# Patient Record
Sex: Female | Born: 1980
Health system: Southern US, Community
[De-identification: ages and names within clinical notes are randomized; demographics above are authoritative.]

## PROBLEM LIST (undated history)

## (undated) DIAGNOSIS — D649 Anemia, unspecified: Secondary | ICD-10-CM

## (undated) DIAGNOSIS — R42 Dizziness and giddiness: Secondary | ICD-10-CM

## (undated) DIAGNOSIS — E13319 Other specified diabetes mellitus with unspecified diabetic retinopathy without macular edema: Secondary | ICD-10-CM

## (undated) DIAGNOSIS — I509 Heart failure, unspecified: Secondary | ICD-10-CM

## (undated) DIAGNOSIS — M1990 Unspecified osteoarthritis, unspecified site: Secondary | ICD-10-CM

## (undated) DIAGNOSIS — N189 Chronic kidney disease, unspecified: Secondary | ICD-10-CM

## (undated) DIAGNOSIS — E119 Type 2 diabetes mellitus without complications: Secondary | ICD-10-CM

## (undated) DIAGNOSIS — G629 Polyneuropathy, unspecified: Secondary | ICD-10-CM

## (undated) DIAGNOSIS — M419 Scoliosis, unspecified: Secondary | ICD-10-CM

## (undated) DIAGNOSIS — I824Z9 Acute embolism and thrombosis of unspecified deep veins of unspecified distal lower extremity: Secondary | ICD-10-CM

## (undated) DIAGNOSIS — M797 Fibromyalgia: Secondary | ICD-10-CM

## (undated) DIAGNOSIS — K219 Gastro-esophageal reflux disease without esophagitis: Secondary | ICD-10-CM

## (undated) DIAGNOSIS — N289 Disorder of kidney and ureter, unspecified: Secondary | ICD-10-CM

## (undated) DIAGNOSIS — G4730 Sleep apnea, unspecified: Secondary | ICD-10-CM

## (undated) DIAGNOSIS — E785 Hyperlipidemia, unspecified: Secondary | ICD-10-CM

## (undated) DIAGNOSIS — J45909 Unspecified asthma, uncomplicated: Secondary | ICD-10-CM

## (undated) DIAGNOSIS — I10 Essential (primary) hypertension: Secondary | ICD-10-CM

## (undated) HISTORY — DX: Anemia, unspecified: D64.9

## (undated) HISTORY — PX: OTHER SURGICAL HISTORY: SHX169

## (undated) HISTORY — DX: Sleep apnea, unspecified: G47.30

## (undated) HISTORY — PX: CHOLECYSTECTOMY: SHX55

## (undated) HISTORY — PX: EYE SURGERY: SHX253

## (undated) HISTORY — PX: ABLATION ENDOMETRIAL: SHX51001

## (undated) HISTORY — DX: Scoliosis, unspecified: M41.9

## (undated) HISTORY — DX: Acute embolism and thrombosis of unspecified deep veins of unspecified distal lower extremity: I82.4Z9

## (undated) HISTORY — PX: APPENDECTOMY (OPEN): SHX54

## (undated) HISTORY — DX: Disorder of kidney and ureter, unspecified: N28.9

## (undated) HISTORY — DX: Type 2 diabetes mellitus without complications: E11.9

## (undated) HISTORY — DX: Gastro-esophageal reflux disease without esophagitis: K21.9

## (undated) NOTE — H&P (Signed)
Formatting of this note is different from the original.  Images from the original note were not included.  PATIENT NAME:  [name]  PATIENT DOB:  1981/02/22    DATE OF SERVICE: 01/06/2023    REASON FOR VISIT:   Chief Complaint   Patient presents with    Consult     Bariatric program     HPI:  I had the pleasure of seeing [name] in the office today.  In brief, this 89 y.o. female with stage 4 chronic kidney disease, diabetes, hypertension, and DVT/PE on anticoagulation was referred for possible bariatric surgery.    [name] tells me that she has had weight problems ever since her 1st pregnancy at age 76.  That pregnancy was complicated by what sounds like a necrotizing soft tissue infection requiring take back to the operating room, small-bowel resection, and abdominal wall reconstruction with mesh.  A similar infection actually happened after her 2nd pregnancy as well.  She has seen Infectious Disease and no one is sure why this happened multiple times.  She has gone through other surgeries without such severe complications such as hand surgeries.  From a weight standpoint she has ?tried everything? including multiple dietary and fitness regimens.  She has been able to lose about 45 lb, but the weight came back.  She is currently on Wegovy but has not seen a major change.  For all of these reasons she is interested in weight loss surgery.    [name] tells me she does have some GERD symptoms 3-4 times per week.  She denies dysphagia or odynophagia and is on omeprazole as needed.  She takes an aspirin a day and is on Eliquis for her DVT/PE history.      The pertinent past medical, surgical, social, family, medication, and allergy history was reviewed and updated in the EMR as necessary.  See below for details.    PAST HISTORIES:  Past Medical History:   Diagnosis Date    Asthma     Chronic kidney disease (CKD), active medical management without dialysis, stage 4 (severe)     Diabetes mellitus, type  2     Fibromyalgia     GERD (gastroesophageal reflux disease)     Hyperlipidemia     Hypertension     Sleep apnea     no cpap     Past Surgical History:   Procedure Laterality Date    APPENDECTOMY  2002    CESAREAN SECTION  2000    x2 second one in 2006    CHOLECYSTECTOMY  2000    HYSTERECTOMY  2021    LAPAROTOMY  2000    abdominal wall debeidement, repair with mesh, partial colon resection     Past family and social history is notable for:  Smoking/vaping: No  The patient is a nonsmoker who denies alcohol or illegal drugs.  There is no family history of bleeding/clotting disorders, nor issues with anesthesia or cancer in primary relatives.    Current Outpatient Medications:     ALBUTEROL IN, Inhale into the lungs., Disp: , Rfl:     apixaban (ELIQUIS) 5 MG TABS, Take 1 tablet by mouth 2 times daily., Disp: , Rfl:     aspirin 81 MG chewable tablet, Take 1 tablet by mouth daily., Disp: , Rfl:     atorvaSTATin (LIPITOR) 20 MG tablet, Take 1 tablet by mouth daily., Disp: , Rfl:     bumetanide (BUMEX) 2 MG tablet, Take 1 tablet by  mouth daily., Disp: , Rfl:     buPROPion 12 HR (WELLBUTRIN SR) 150 MG 12 hr tablet, Take 1 tablet by mouth 2 times daily., Disp: , Rfl:     carvedilol (COREG) 25 MG tablet, Take 1 tablet by mouth 2 times daily (with meals)., Disp: , Rfl:     DULoxetine (CYMBALTA) 30 MG capsule, Take 1 capsule by mouth daily., Disp: , Rfl:     hydrALAZINE (APRESOLINE) 50 MG tablet, Take 1 tablet by mouth 3 times daily., Disp: , Rfl:     hydrOXYzine (ATARAX) 25 MG tablet, Take 1 tablet by mouth as needed., Disp: , Rfl:     Insulin Detemir (LEVEMIR FLEXPEN SC), Inject into the skin., Disp: , Rfl:     insulin lispro (HUMALOG) 100 UNIT/ML injection, Inject into the skin 3 times daily (with meals)., Disp: , Rfl:     Mometasone Furo-Formoterol Fum 200-5 MCG/ACT AERO, Inhale 200 Inhalers into the lungs., Disp: , Rfl:     omeprazole (PRILOSEC) 10 MG capsule, Take 1 capsule by mouth as needed., Disp: , Rfl:      pregabalin (LYRICA) 100 MG capsule, Take by mouth 2 times daily., Disp: , Rfl:     semaglutide-Weight Management (WEGOVY) 1 MG/0.5ML SOAJ injection, Inject 0.5 mL into the skin once a week., Disp: , Rfl:     traZODone HCl POWD, by Does not apply route., Disp: , Rfl:   I personally reviewed the patient's medication list.  Allergies   Allergen Reactions    Amlodipine Other (See Comments)     allergy    Bactrim [Sulfamethoxazole W-Trimethoprim] Other (See Comments)     allergy    Glipizide Other (See Comments)     allergy    Lisinopril Other (See Comments)     allergy     ROS:  Review of Systems   Constitutional: Negative.    HENT: Negative.     Respiratory: Negative.     Cardiovascular: Negative.    Gastrointestinal:         See HPI for GI issues   Genitourinary: Negative.    Musculoskeletal: Negative.    Skin: Negative.    Neurological: Negative.    Psychiatric/Behavioral: Negative.         PHYSICAL EXAMINATION:  Vitals:    01/06/23 0926   BP: 140/72   BP Location: Right arm   Patient Position: Sitting   BP Cuff Size: Adult   Pulse: 84   Temp: 36.4 C (97.5 F)   Weight: (!) 135.6 kg (299 lb)   Height: 1.676 m ('5\' 6"'$ )     Body mass index is 48.26 kg/m.    Physical Exam  Vitals and nursing note reviewed. Exam conducted with a chaperone present.   Constitutional:       General: She is not in acute distress.     Appearance: Normal appearance. She is obese. She is not ill-appearing, toxic-appearing or diaphoretic.   HENT:      Head: Normocephalic and atraumatic.   Eyes:      General: No scleral icterus.     Extraocular Movements: Extraocular movements intact.   Cardiovascular:      Rate and Rhythm: Normal rate and regular rhythm.   Pulmonary:      Effort: Pulmonary effort is normal. No respiratory distress.      Breath sounds: No stridor. No wheezing.   Abdominal:      General: Abdomen is flat. There is no distension.      Palpations:  Abdomen is soft. There is no mass.      Tenderness: There is no abdominal tenderness.  There is no guarding.      Hernia: No hernia is present.      Comments: multiple well healed lower abdominal scars and some abdominal deformity, laparoscopic upper abdominal scars   Musculoskeletal:         General: No swelling or deformity. Normal range of motion.      Cervical back: Normal range of motion.   Skin:     General: Skin is warm and dry.      Coloration: Skin is not jaundiced.   Neurological:      General: No focal deficit present.      Mental Status: She is alert and oriented to person, place, and time. Mental status is at baseline.   Psychiatric:         Mood and Affect: Mood normal.         Behavior: Behavior normal.         Thought Content: Thought content normal.         Judgment: Judgment normal.     Labs/Imaging/Pathology:  I personally reviewed any relevant labs, imaging, and pathology with the patient.  Of note, none.    ASSESSMENT:  Primary Visit Diagnosis: Class 3 severe obesity due to excess calories with serious comorbidity and body mass index (BMI) of 45.0 to 49.9 in adult [E66.01, Z68.42]    [name] has class three obesity and associated comorbidities such as obstructive sleep apnea and hypertension resistant to medical therapy.  She is a higher risk surgical candidate given her multiple comorbidities otherwise.    PLAN:  I had a lengthy discussion with [name] today regarding bariatric surgery.  We spoke about obesity and treatment options, focusing on surgical interventions.  I highlighted the lifestyle changes/dietary management that are also required concurrently for success.  We talked about surgical procedures, namely sleeve gastrectomy and gastric bypass, and briefly went over the differences.  We also discussed insurance and program requirements for a successful bariatric journey.  In her case, I would lean towards a sleeve even if significant GERD is present given her potential need for kidney transplant, and polypharmacy/potential future steroid use.    In addition to our standard  program and patient-specific insurance requirements, I recommend adding an upper endoscopy, cardiology evaluation (given her prior cardiac arrest with her pregnancies), nephrology optimization (given CKD), and hematology evaluation (given her prior DVT/PE history and anti-platelet + anticoagulation therapy preoperatively.  I therefore obtained informed consent for an EGD.  I explained the relative risks/benefits, alternative options, possible complications, and possibility of unexpected intraoperative pathology which could lead to adjustments or major changes to the procedure.  All questions were answered and we will plan this per reflux endoscopy protocol at [facility] - we will stop the Eliquis for 48 hours and continue her aspirin; this will be triple-checked with her PCP.    I will plan to follow up with [name] in a few months after completion of our bariatric program, at which time we will hone in on definitive management/operation of choice.  I was able to answer all questions today and feel that this patient could be an good bariatric candidate with a careful preoperative evaulation.    Lynn Passy Isabell Jarvis, MD, DABS, FACS FASMBS  Minimally Invasive Surgery, Bariatrics, & Flexible Endoscopy  [facility]    Electronically signed by Mike Craze, MD at 01/06/2023 11:11 AM EST

## (undated) NOTE — Progress Notes (Signed)
Formatting of this note might be different from the original.  Assessment:  Pt attended [facility] Class A for pre bariatric surgery nutrition education, in preparation for bariatric surgery.  Pt has struggled with her weight and has been unsuccessful with long term weight loss.    Diagnosis: Food- and nutrition-related knowledge deficit related to lack of prior nutrition education as evidenced by questions related to nutrition to support improved eating habits, post-bariatric surgery success, and long-term weight maintenance.    Intervention:  During this live, virtual & interactive class, education was provided on the postoperative diet guidelines and behavioral changes recommended to prepare for success after surgery.    Topics included basic nutrition principles, including slowing rate of eating. Rationale was given, as were tips to achieve. Emphasized need for scheduled meals and snacks through the day. Discussed portions and signs of fullness. Emphasized diet goal of 60-80 g/d protein and how to achieve. Fluid goals discussed, with 48-64+ oz/day recommended.     Emphasized activity guidelines and how to incorporate more activity into lifestyle, gradually post op, short term and long term.     Discussed SMART goals, rationale and how to set goals for self. Discussed how to read food labels, particularly for protein, calories, added sugars, other nutrients & how to make choices. Pt able to successfully answer 6 of 6 quiz questions with 100% accuracy.     Discussed healthy habits, including shopping tips. Showed Plate Method for Powering Up nutrition from meals.    Goals:  Aim for 3 meals and 1-2 planned snacks per day  Practice separating ingestion of foods and fluids; sip fluids slowly & steadily throughout the day  Begin eliminating sugar-sweetened beverages, caffeine, carbonation  Aim to include 30 minutes of moderate activity on 5 days per week, as tolerated    Educational Materials Provided: Bariatric  Postoperative Nutrition Guidelines  Monitoring and Evaluation: food/nutrition intake, food- and nutrition-related knowledge, mealtime behaviors, anthropometric measurements, biochemical data, comparative standards    Plan: Continue nutrition visits in preparation for surgery.  Face to face time with patient: 60 minutes   Electronically signed by Dianna Limbo, RD at 02/13/2023  2:46 PM EDT

## (undated) NOTE — Telephone Encounter (Signed)
Formatting of this note might be different from the original.  Phone call to pt, returning her call.  Pt notes that she has been in hospital and ED visit(s) that caused her to delay completing some bariatric prerequisites. She has not had a nutrition appt in 3 months. Part of that was because a lapse in her insurance coverage.   Reviewed prerequisites, including need for 6 monthly nutr appts in 6 consecutive months, per her insurance. Will schedule pt again for nutrition appt. Sees RD for first individual appt next month and has completed two group nutrition classes.   Reviewed clearances she needs from PCP and from her specialists (nephrology, cardiology, hematology).   Electronically signed by Dianna Limbo, RD at 06/03/2023  3:28 PM EDT

---
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
## 2012-02-14 DIAGNOSIS — G629 Polyneuropathy, unspecified: Secondary | ICD-10-CM | POA: Insufficient documentation

## 2019-08-11 DIAGNOSIS — I2699 Other pulmonary embolism without acute cor pulmonale: Secondary | ICD-10-CM

## 2019-08-11 HISTORY — DX: Other pulmonary embolism without acute cor pulmonale: I26.99

## 2019-12-12 HISTORY — PX: HYSTERECTOMY: SHX81

## 2020-08-09 ENCOUNTER — Emergency Department
Admission: EM | Admit: 2020-08-09 | Discharge: 2020-08-10 | Discharge status: Against Medical Advice | Payer: No Typology Code available for payment source | Attending: Emergency Medicine | Admitting: Emergency Medicine

## 2020-08-09 DIAGNOSIS — Z539 Procedure and treatment not carried out, unspecified reason: Secondary | ICD-10-CM | POA: Insufficient documentation

## 2020-08-09 MED ORDER — ONDANSETRON HCL 4 MG/2ML IJ SOLN
4.0000 mg | Freq: Once | INTRAMUSCULAR | Status: DC
Start: 2020-08-09 — End: 2020-08-10

## 2020-08-09 NOTE — EDIE (Signed)
COLLECTIVE?NOTIFICATION?08/09/2020 21:28?Currie Paris?MRN: [id number]    Criteria Met      5 ED Visits in 12 Months    3 Different Facilities in 90 Days    Security and Safety  No recent Security Events currently on file    ED Care Guidelines  There are currently no ED Care Guidelines for this patient. Please check your facility's medical records system.        Prescription Monitoring Program  120??- Narcotic Use Score  060??- Sedative Use Score  000??- Stimulant Use Score  310??- Overdose Risk Score  - All Scores range from 000-999 with 75% of the population scoring < 200 and on 1% scoring above 650  - The last digit of the narcotic, sedative, and stimulant score indicates the number of active prescriptions of that type  - Higher Use scores correlate with increased prescribers, pharmacies, mg equiv, and overlapping prescriptions  - Higher Overdose Risk Scores correlate with increased risk of unintentional overdose death   Concerning or unexpectedly high scores should prompt a review of the PMP record; this does not constitute checking PMP for prescribing purposes.      E.D. Visit Count (12 mo.)  Facility Visits   [facility] 4   [facility] 2   [place] [facility] 1   [facility] 1   Total 8   Note: Visits indicate total known visits.     Recent Emergency Department Visit Summary  Date Facility [facility] Type Diagnoses or Chief Complaint   Aug 09, 2020 [place] [facility] [place] [facility]. [place] [facility]      nausea      Jun 22, 2020 [facility] Cur[facility]. Staff. [place] [facility]      Mental Health      Mental Health Problem      Hyperglycemia, unspecified      Major depressive disorder, single episode, unspecified      Jun 14, 2020 [facility] Rosemarie Ax. [place] [facility]      Dorsalgia, unspecified      Type 2 diabetes mellitus with hyperglycemia      Long term (current) use of insulin      May 29, 2020 [facility]  Rosemarie Ax. [place] [facility]      Nondisplaced fracture of fifth metatarsal bone, left foot, initial encounter for closed fracture      Fall on same level from slipping, tripping and stumbling without subsequent striking against object, initial encounter      Oct 25, 2019 [place] [facility] Rosemarie Ax. [place] [facility]      EMS      Syncope      Syncope and collapse      Dizziness and giddiness      Sep 30, 2019 [place] [facility]. Rosemarie Ax. [place] [facility]      SOB/Difficulty Breathing      Abnormal Lab      Heart failure, unspecified      Localized edema      Sep 25, 2019 [place] [facility] Rosemarie Ax. [place] [facility]      Chest Pain      Leg Swelling      Edema, unspecified      Aug 19, 2019 [place] [facility]. Rosemarie Ax. [place] [facility]      Chest Pain      Asthma      Other pulmonary embolism without acute cor pulmonale  Recent Inpatient Visit Summary  Date Facility [facility] Type Diagnoses or Chief Complaint   Dec 26, 2019 [facility] [place] [facility]. 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marie Ax. [facility]      Heart failure, unspecified      Localized edema      Hypertensive urgency      Aug 19, 2019 [place] [facility]. Rosemarie Ax. [place] [facility]      Other pulmonary embolism without acute cor pulmonale      Acute diastolic (congestive) heart failure      Acute  posthemorrhagic anemia      Abnormal uterine and vaginal bleeding, unspecified          Care Team  Provider Specialty Phone Fax Service Dates   [facility] Clinic/Center: [facility]) [phone] Current    Nash Mantis , MD Family Medicine: Adult Medicine   Current      Collective Portal  This patient has registered at the [facility] Emergency Department   For more information visit: [email or web address]     PLEASE NOTE:     1.   Any care recommendations and other clinical information are provided as guidelines or for historical purposes only, and providers should exercise their own clinical judgment when providing care.    2.   You may only use this information for purposes of treatment, payment or health care operations activities, and subject to the limitations of applicable Collective Policies.    3.   You should consult directly with the organization that provided a care guideline or other clinical history with any questions about additional information or accuracy or completeness of information provided.    ? 2021 [place], [employer] - [email or web address]

## 2020-08-09 NOTE — ED Triage Notes (Signed)
[name] is a 39 y.o. female with a cc of n/v and abdominal cramping x 2 days. Pt reports 3 episodes of emesis today. No meds PTA. Pt also reports concern for COVID as she reports her son was exposed to COVID-19 at school.  Blood pressure 157/89, pulse (!) 102, temperature 98.8 F (37.1 C), temperature source Oral, resp. rate 18, SpO2 98 %.

## 2020-08-10 ENCOUNTER — Emergency Department: Payer: No Typology Code available for payment source

## 2020-08-10 ENCOUNTER — Inpatient Hospital Stay
Admission: EM | Admit: 2020-08-10 | Discharge: 2020-08-13 | DRG: 420 | Disposition: A | Discharge status: Home / Self Care | Payer: No Typology Code available for payment source | Attending: Internal Medicine | Admitting: Internal Medicine

## 2020-08-10 DIAGNOSIS — J45901 Unspecified asthma with (acute) exacerbation: Secondary | ICD-10-CM

## 2020-08-10 DIAGNOSIS — R0602 Shortness of breath: Secondary | ICD-10-CM

## 2020-08-10 DIAGNOSIS — Z7901 Long term (current) use of anticoagulants: Secondary | ICD-10-CM

## 2020-08-10 DIAGNOSIS — N183 Chronic kidney disease, stage 3 unspecified: Secondary | ICD-10-CM | POA: Diagnosis present

## 2020-08-10 DIAGNOSIS — E1322 Other specified diabetes mellitus with diabetic chronic kidney disease: Secondary | ICD-10-CM | POA: Diagnosis present

## 2020-08-10 DIAGNOSIS — N179 Acute kidney failure, unspecified: Secondary | ICD-10-CM | POA: Diagnosis present

## 2020-08-10 DIAGNOSIS — T50905A Adverse effect of unspecified drugs, medicaments and biological substances, initial encounter: Secondary | ICD-10-CM | POA: Diagnosis present

## 2020-08-10 DIAGNOSIS — I130 Hypertensive heart and chronic kidney disease with heart failure and stage 1 through stage 4 chronic kidney disease, or unspecified chronic kidney disease: Secondary | ICD-10-CM | POA: Diagnosis present

## 2020-08-10 DIAGNOSIS — Z79899 Other long term (current) drug therapy: Secondary | ICD-10-CM

## 2020-08-10 DIAGNOSIS — I5032 Chronic diastolic (congestive) heart failure: Secondary | ICD-10-CM | POA: Diagnosis present

## 2020-08-10 DIAGNOSIS — E1340 Other specified diabetes mellitus with diabetic neuropathy, unspecified: Secondary | ICD-10-CM | POA: Diagnosis present

## 2020-08-10 DIAGNOSIS — J4521 Mild intermittent asthma with (acute) exacerbation: Secondary | ICD-10-CM | POA: Diagnosis present

## 2020-08-10 DIAGNOSIS — E1365 Other specified diabetes mellitus with hyperglycemia: Secondary | ICD-10-CM | POA: Diagnosis present

## 2020-08-10 DIAGNOSIS — I2699 Other pulmonary embolism without acute cor pulmonale: Secondary | ICD-10-CM | POA: Diagnosis present

## 2020-08-10 DIAGNOSIS — R739 Hyperglycemia, unspecified: Secondary | ICD-10-CM | POA: Diagnosis present

## 2020-08-10 DIAGNOSIS — Z20822 Contact with and (suspected) exposure to covid-19: Secondary | ICD-10-CM | POA: Diagnosis present

## 2020-08-10 DIAGNOSIS — E785 Hyperlipidemia, unspecified: Secondary | ICD-10-CM | POA: Diagnosis present

## 2020-08-10 DIAGNOSIS — Z833 Family history of diabetes mellitus: Secondary | ICD-10-CM

## 2020-08-10 DIAGNOSIS — E1300 Other specified diabetes mellitus with hyperosmolarity without nonketotic hyperglycemic-hyperosmolar coma (NKHHC): Principal | ICD-10-CM | POA: Diagnosis present

## 2020-08-10 DIAGNOSIS — M797 Fibromyalgia: Secondary | ICD-10-CM | POA: Diagnosis present

## 2020-08-10 DIAGNOSIS — Z6841 Body Mass Index (BMI) 40.0 and over, adult: Secondary | ICD-10-CM

## 2020-08-10 HISTORY — DX: Fibromyalgia: M79.7

## 2020-08-10 HISTORY — DX: Unspecified osteoarthritis, unspecified site: M19.90

## 2020-08-10 HISTORY — DX: Heart failure, unspecified: I50.9

## 2020-08-10 HISTORY — DX: Polyneuropathy, unspecified: G62.9

## 2020-08-10 HISTORY — DX: Dizziness and giddiness: R42

## 2020-08-10 HISTORY — DX: Hyperlipidemia, unspecified: E78.5

## 2020-08-10 HISTORY — DX: Unspecified asthma, uncomplicated: J45.909

## 2020-08-10 HISTORY — DX: Essential (primary) hypertension: I10

## 2020-08-10 HISTORY — DX: Chronic kidney disease, unspecified: N18.9

## 2020-08-10 HISTORY — DX: Type 2 diabetes mellitus without complications: E11.9

## 2020-08-10 LAB — GLUCOSE WHOLE BLOOD - POCT
Whole Blood Glucose POCT: 347 mg/dL — ABNORMAL HIGH (ref 70–100)
Whole Blood Glucose POCT: 347 mg/dL — ABNORMAL HIGH (ref 70–100)

## 2020-08-10 LAB — CBC AND DIFFERENTIAL
Absolute NRBC: 0 10*3/uL (ref 0.00–0.00)
Basophils Absolute Automated: 0.06 10*3/uL (ref 0.00–0.08)
Basophils Automated: 0.5 %
Eosinophils Absolute Automated: 0.29 10*3/uL (ref 0.00–0.44)
Eosinophils Automated: 2.2 %
Hematocrit: 32.9 % — ABNORMAL LOW (ref 34.7–43.7)
Hgb: 10.4 g/dL — ABNORMAL LOW (ref 11.4–14.8)
Immature Granulocytes Absolute: 0.06 10*3/uL (ref 0.00–0.07)
Immature Granulocytes: 0.5 %
Lymphocytes Absolute Automated: 3.84 10*3/uL — ABNORMAL HIGH (ref 0.42–3.22)
Lymphocytes Automated: 29.3 %
MCH: 25.9 pg (ref 25.1–33.5)
MCHC: 31.6 g/dL (ref 31.5–35.8)
MCV: 81.8 fL (ref 78.0–96.0)
MPV: 10.8 fL (ref 8.9–12.5)
Monocytes Absolute Automated: 0.74 10*3/uL (ref 0.21–0.85)
Monocytes: 5.7 %
Neutrophils Absolute: 8.1 10*3/uL — ABNORMAL HIGH (ref 1.10–6.33)
Neutrophils: 61.8 %
Nucleated RBC: 0 /100 WBC (ref 0.0–0.0)
Platelets: 404 10*3/uL — ABNORMAL HIGH (ref 142–346)
RBC: 4.02 10*6/uL (ref 3.90–5.10)
RDW: 12 % (ref 11–15)
WBC: 13.09 10*3/uL — ABNORMAL HIGH (ref 3.10–9.50)

## 2020-08-10 LAB — COMPREHENSIVE METABOLIC PANEL
ALT: 17 U/L (ref 0–55)
AST (SGOT): 11 U/L (ref 5–34)
Albumin/Globulin Ratio: 0.9 (ref 0.9–2.2)
Albumin: 3.6 g/dL (ref 3.5–5.0)
Alkaline Phosphatase: 104 U/L (ref 37–106)
Anion Gap: 13 (ref 5.0–15.0)
BUN: 82 mg/dL — ABNORMAL HIGH (ref 7.0–19.0)
Bilirubin, Total: 0.3 mg/dL (ref 0.2–1.2)
CO2: 24 mEq/L (ref 22–29)
Calcium: 9.6 mg/dL (ref 8.5–10.5)
Chloride: 91 mEq/L — ABNORMAL LOW (ref 100–111)
Creatinine: 2.2 mg/dL — ABNORMAL HIGH (ref 0.6–1.0)
Globulin: 4.2 g/dL — ABNORMAL HIGH (ref 2.0–3.6)
Glucose: 398 mg/dL — ABNORMAL HIGH (ref 70–100)
Potassium: 4.3 mEq/L (ref 3.5–5.1)
Protein, Total: 7.8 g/dL (ref 6.0–8.3)
Sodium: 128 mEq/L — ABNORMAL LOW (ref 136–145)

## 2020-08-10 LAB — BLOOD GAS, VENOUS
Base Excess, Ven: 0.9 mEq/L
HCO3, Ven: 26.3 mEq/L
O2 Sat, Venous: 65.6 %
Temperature: 37
Venous Total CO2: 55.3 mEq/L
pCO2, Venous: 48.5 mmHg
pH, Ven: 7.354
pO2, Venous: <42.7 mmHg

## 2020-08-10 LAB — COVID-19 (SARS-COV-2) & INFLUENZA  A/B, NAA (ROCHE LIAT)
Influenza A: NOT DETECTED
Influenza B: NOT DETECTED
SARS CoV 2 Overall Result: NOT DETECTED

## 2020-08-10 LAB — HEMOLYSIS INDEX: Hemolysis Index: 0 (ref 0–18)

## 2020-08-10 LAB — GFR: EGFR: 24.6

## 2020-08-10 LAB — LACTIC ACID, PLASMA: Lactic Acid: 1.2 mmol/L (ref 0.2–2.0)

## 2020-08-10 LAB — TROPONIN I: Troponin I: <0.01 ng/mL (ref 0.00–0.05)

## 2020-08-10 MED ORDER — SODIUM CHLORIDE 0.9 % IV BOLUS
500.0000 mL | Freq: Once | INTRAVENOUS | Status: AC
Start: 2020-08-10 — End: 2020-08-11
  Administered 2020-08-10: 23:00:00 500 mL via INTRAVENOUS

## 2020-08-10 MED ORDER — SODIUM CHLORIDE 0.9 % IV BOLUS
1000.0000 mL | Freq: Once | INTRAVENOUS | Status: DC
Start: 2020-08-10 — End: 2020-08-10

## 2020-08-10 MED ORDER — DEXAMETHASONE SOD PHOSPHATE PF 10 MG/ML IJ SOLN
10.0000 mg | Freq: Once | INTRAMUSCULAR | Status: AC
Start: 2020-08-10 — End: 2020-08-10
  Administered 2020-08-10: 10 mg via INTRAVENOUS
  Filled 2020-08-10: qty 1

## 2020-08-10 MED ORDER — ALBUTEROL SULFATE HFA 108 (90 BASE) MCG/ACT IN AERS
4.0000 | INHALATION_SPRAY | Freq: Once | RESPIRATORY_TRACT | Status: AC
Start: 2020-08-10 — End: 2020-08-10
  Administered 2020-08-10: 23:00:00 4 via RESPIRATORY_TRACT

## 2020-08-10 MED ORDER — ONDANSETRON HCL 4 MG/2ML IJ SOLN
4.0000 mg | Freq: Once | INTRAMUSCULAR | Status: AC
Start: 2020-08-10 — End: 2020-08-10
  Administered 2020-08-10: 4 mg via INTRAVENOUS
  Filled 2020-08-10: qty 2

## 2020-08-10 NOTE — EDIE (Signed)
COLLECTIVE?NOTIFICATION?08/10/2020 21:11?Currie Paris?MRN: [id number]    Criteria Met      3 Different Facilities in 90 Days    5 ED Visits in 12 Months    Security and Safety  No recent Security Events currently on file    ED Care Guidelines  There are currently no ED Care Guidelines for this patient. Please check your facility's medical records system.        Prescription Monitoring Program  120??- Narcotic Use Score  060??- Sedative Use Score  000??- Stimulant Use Score  310??- Overdose Risk Score  - All Scores range from 000-999 with 75% of the population scoring < 200 and on 1% scoring above 650  - The last digit of the narcotic, sedative, and stimulant score indicates the number of active prescriptions of that type  - Higher Use scores correlate with increased prescribers, pharmacies, mg equiv, and overlapping prescriptions  - Higher Overdose Risk Scores correlate with increased risk of unintentional overdose death   Concerning or unexpectedly high scores should prompt a review of the PMP record; this does not constitute checking PMP for prescribing purposes.      E.D. Visit Count (12 mo.)  Facility Visits   [facility] 4   [facility] 2   [place] [facility] 2   [facility] 1   Total 9   Note: Visits indicate total known visits.     Recent Emergency Department Visit Summary  Date Facility [facility] Type Diagnoses or Chief Complaint   Aug 10, 2020 [place] [facility] [place] [facility] Alexa. [place] [facility]      sob; difficulty breathing; diabetic; chest pain      Aug 09, 2020 [place] [facility] [place] [facility] Alexa. [place] [facility]      nausea      Abdominal Cramping      Emesis      Jun 22, 2020 [facility] Cur[facility]. Staff. [place] [facility]      Mental Health      Mental Health Problem      Hyperglycemia, unspecified      Major depressive disorder, single episode, unspecified      Jun 14, 2020 [facility] Rosemarie Ax. [place] [facility]      Dorsalgia,  unspecified      Type 2 diabetes mellitus with hyperglycemia      Long term (current) use of insulin      May 29, 2020 [facility]marie Ax. [place] [facility]      Nondisplaced fracture of fifth metatarsal bone, left foot, initial encounter for closed fracture      Fall on same level from slipping, tripping and stumbling without subsequent striking against object, initial encounter      Oct 25, 2019 [place] [facility] Rosemarie Ax. [place] [facility]      EMS      Syncope      Syncope and collapse      Dizziness and giddiness      Sep 30, 2019 [place] [facility]. Rosemarie Ax. [place] [facility]      SOB/Difficulty Breathing      Abnormal Lab      Heart failure, unspecified      Localized edema      Sep 25, 2019 [place] [facility]. Rosemarie Ax.  Emergency      Chest Pain      Leg Swelling      Edema, unspecified      Aug 19, 2019 [facility]  [place] [facility] Rosemarie Ax. [place] [facility]      Chest Pain      Asthma      Other pulmonary embolism without acute cor pulmonale          Recent Inpatient Visit Summary  Date Facility [facility] Type Diagnoses or Chief Complaint   Dec 26, 2019 [facility] [place] [facility] 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 Rosemarie Ax. [place] [facility]      Heart failure, unspecified      Localized edema      Hypertensive urgency      Aug 19, 2019 [place] [facility].  Rosemarie Ax. [place] [facility]      Other pulmonary embolism without acute cor pulmonale      Acute diastolic (congestive) heart failure      Acute posthemorrhagic anemia      Abnormal uterine and vaginal bleeding, unspecified          Care Team  Provider Specialty Phone Fax Service Dates   [facility] Clinic/Center: [facility]) [phone] Current    Nash Mantis , MD Family Medicine: Adult Medicine   Current      Collective Portal  This patient has registered at the [facility] Emergency Department   For more information visit: [email or web address]     PLEASE NOTE:     1.   Any care recommendations and other clinical information are provided as guidelines or for historical purposes only, and providers should exercise their own clinical judgment when providing care.    2.   You may only use this information for purposes of treatment, payment or health care operations activities, and subject to the limitations of applicable Collective Policies.    3.   You should consult directly with the organization that provided a care guideline or other clinical history with any questions about additional information or accuracy or completeness of information provided.    ? 2021 [place], [employer] - [email or web address]

## 2020-08-10 NOTE — ED Triage Notes (Signed)
Patient to ED from home with c/o cough, SOB and chest pain 7/10 x 2-3 days associated with N/V. Patient has hx of DM and blood sugar machine reading high. Fully vaccinated for COVID. Here to ED for evaluation.

## 2020-08-10 NOTE — ED Notes (Signed)
Blood sugar 347

## 2020-08-10 NOTE — ED Provider Notes (Signed)
History     Chief Complaint   Patient presents with   . Shortness of Breath   . Chest Pain   . Emesis     This is a 39 year old female with past medical history of diabetes, CKD, asthma, CHF who presents for elevated blood glucose, cough shortness of breath and chest tightness.  Patient states that over the past 2 days she has had decreased p.o. intake secondary to nausea and vomiting.  Also admits to headache and nonproductive cough.  Denies any fever.  She states that she recently moved from [place] and does not currently have a primary care doctor.  She does have an endocrinologist who she saw 2 days ago and was started on Lantus, Humalog, Metformin and Victoza.  She states that she has been taking her Lantus as well as other medications as scheduled and has been discussing her other medications for her other medical problems with her primary care doctor in [place] who has been taking care of her.  She has not been taking Humalog over the past 2 days as she has not been eating much.  She states that she was previously on medication which controlled her diabetes in [place] but her insurance in [place] does not cover it.  Denies any fever.  Does admit to her son being exposed to Covid.  Patient is vaccinated for Covid    PMH: See HPI  PSH: See nursing notes  Allergies: Bactrim  Social: Denies alcohol, cigarettes           Past Medical History:   Diagnosis Date   . Arthritis    . Asthma    . Chronic kidney disease    . Congestive heart failure    . Diabetes mellitus    . Fibromyalgia    . Hyperlipidemia    . Hypertension    . Neuropathy    . Pulmonary embolism 08/2019   . Vertigo        Past Surgical History:   Procedure Laterality Date   . HYSTERECTOMY  12/2019       Family History   Problem Relation Age of Onset   . Heart disease Mother    . Hypertension Mother    . Diabetes Mother    . Asthma Father    . Hypertension Father    . Diabetes Daughter    . Asthma Son    . Cancer Maternal Aunt    . Cancer  Maternal Grandmother        Social  Social History     Tobacco Use   . Smoking status: Never Smoker   . Smokeless tobacco: Never Used   Substance Use Topics   . Alcohol use: Never   . Drug use: Never       .     Allergies   Allergen Reactions   . Bactrim [Sulfamethoxazole-Trimethoprim] Hives, Respiratory Distress and Edema       Home Medications     Med List Status: Complete Set By: Georgina Peer, RN at 08/11/2020  2:27 AM                apixaban (ELIQUIS) 5 MG     Take 5 mg by mouth every 12 (twelve) hours     atorvastatin (LIPITOR) 20 MG tablet     Take 20 mg by mouth nightly     baclofen (LIORESAL) 10 MG tablet     Take 10 mg by mouth as needed  bumetanide (BUMEX) 2 MG tablet     Take 2 mg by mouth 2 (two) times daily     carvedilol (COREG) 25 MG tablet     Take 25 mg by mouth 2 (two) times daily     gabapentin (NEURONTIN) 600 MG tablet     Take 600 mg by mouth 3 (three) times daily     hydrALAZINE (APRESOLINE) 50 MG tablet     Take 50 mg by mouth 2 (two) times daily     hydrOXYzine (ATARAX) 10 MG tablet     Take 10 mg by mouth every 6 (six) hours as needed for Anxiety     metOLazone (ZAROXOLYN) 10 MG tablet     Take 10 mg by mouth daily     omeprazole (PriLOSEC) 40 MG capsule     Take 40 mg by mouth nightly           Review of Systems  The ROS documented in this emergency department record has been reviewed and confirmed by me. Those systems with pertinent positive or negative responses have been documented in the history of present illness. All other systems are otherwise negative and/or noncontributory.    Physical Exam    BP: 137/84, Heart Rate: 95, Temp: 99.1 F (37.3 C), Resp Rate: 18, SpO2: 98 %, Weight: 124.3 kg    Physical Exam  Vitals and nursing note reviewed.   Constitutional:       Appearance: She is well-developed. She is obese.   HENT:      Head: Normocephalic and atraumatic.   Eyes:      Extraocular Movements: Extraocular movements intact.      Pupils: Pupils are equal, round, and reactive to  light.   Cardiovascular:      Rate and Rhythm: Normal rate and regular rhythm.   Pulmonary:      Effort: No respiratory distress.      Breath sounds: Wheezing present.   Chest:      Chest wall: No tenderness.   Abdominal:      Palpations: Abdomen is soft.      Tenderness: There is no abdominal tenderness.   Musculoskeletal:      Cervical back: Normal range of motion.      Right lower leg: Edema present.      Left lower leg: Edema present.      Comments: Mild edema   Skin:     General: Skin is warm.      Capillary Refill: Capillary refill takes less than 2 seconds.   Neurological:      General: No focal deficit present.      Mental Status: She is alert and oriented to person, place, and time.   Psychiatric:         Mood and Affect: Mood normal.         Behavior: Behavior normal.           MDM and ED Course     ED Medication Orders (From admission, onward)    Start Ordered     Status Ordering Provider    08/11/20 2200 08/11/20 0020    At bedtime     Route: Subcutaneous  Ordered Dose: 20 Units     Discontinued WALSH, HELEN Y    08/11/20 2200 08/11/20 0020    At bedtime     Route: Subcutaneous  Ordered Dose: 1-3 Units     Discontinued WALSH, HELEN Y    08/11/20 0900 08/11/20 0018  Daily     Route: Subcutaneous  Ordered Dose: 40 mg     Discontinued Ralene Bathe Y    08/11/20 0745 08/11/20 0020    3 times daily before meals     Route: Subcutaneous  Ordered Dose: 1-5 Units     Discontinued WALSH, HELEN Y    08/11/20 0200 08/11/20 0020    RT - Every 6 hours scheduled     Route: Nebulization  Ordered Dose: 3 mL     Discontinued WALSH, HELEN Y    08/11/20 0018 08/11/20 0020  dextrose 50 % bolus 12.5 g  As needed     Route: Intravenous  Ordered Dose: 12.5 g     Acknowledged Linus Salmons M    08/11/20 0018 08/11/20 0020  glucagon (rDNA) (GLUCAGEN) injection 1 mg  As needed     Route: Intramuscular  Ordered Dose: 1 mg     Acknowledged Linus Salmons M    08/11/20 0018 08/11/20 0020  dextrose (GLUCOSE) 40 % oral gel 15 g of  glucose  As needed     Route: Oral  Ordered Dose: 15 g of glucose     Acknowledged Mat Carne    08/11/20 0017 08/11/20 0018    Every 6 hours PRN     Route: Oral  Ordered Dose: 650 mg     Discontinued Ralene Bathe Y    08/11/20 0017 08/11/20 0018    Every 6 hours PRN     Route: Rectal  Ordered Dose: 650 mg     Discontinued WALSH, HELEN Y    08/11/20 0017 08/11/20 0018  ondansetron (ZOFRAN-ODT) disintegrating tablet 4 mg  Every 6 hours PRN     Route: Oral  Ordered Dose: 4 mg     Last MAR action: See Alternative PYERS, KAJSA M    08/11/20 0017 08/11/20 0018  ondansetron (ZOFRAN) injection 4 mg  Every 6 hours PRN     Route: Intravenous  Ordered Dose: 4 mg     Last MAR action: Given Mat Carne    08/11/20 0016 08/11/20 0018  nitroglycerin (NITROSTAT) SL tablet 0.4 mg  Every 5 min PRN     Route: Sublingual  Ordered Dose: 0.4 mg     Acknowledged Linus Salmons M    08/11/20 0015 08/11/20 0018  melatonin tablet 3 mg  At bedtime PRN     Route: Oral  Ordered Dose: 3 mg     Last MAR action: Given Mat Carne    08/11/20 0015 08/11/20 0018    As needed     Route: Oral  Ordered Dose: 15 g of glucose     Discontinued WALSH, HELEN Y    08/11/20 0015 08/11/20 0018    As needed     Route: Intravenous  Ordered Dose: 12.5 g     Discontinued WALSH, HELEN Y    08/11/20 0015 08/11/20 0018    As needed     Route: Intramuscular  Ordered Dose: 1 mg     Discontinued WALSH, HELEN Y    08/11/20 0015 08/11/20 0018  naloxone (NARCAN) injection 0.2 mg  As needed     Route: Intravenous  Ordered Dose: 0.2 mg     Acknowledged Linus Salmons M    08/10/20 2322 08/10/20 2322  dexAMETHasone (PF) (DECADRON) 10 mg/mL injection 10 mg  Once     Route: Intravenous  Ordered Dose: 10 mg     Last MAR action: Given Adrion Menz J  08/10/20 2321 08/10/20 2320  ondansetron (ZOFRAN) injection 4 mg  Once     Route: Intravenous  Ordered Dose: 4 mg     Last MAR action: Given Leverett Camplin J    08/10/20 2217 08/10/20 2216  sodium chloride 0.9 % bolus  500 mL  Once     Route: Intravenous  Ordered Dose: 500 mL     Last MAR action: Stopped Steffany Schoenfelder J    08/10/20 2217 08/10/20 2216  albuterol sulfate HFA (PROVENTIL) inhaler 4 puff  RT - Once     Route: Inhalation  Ordered Dose: 4 puff     Last MAR action: Given Waver Dibiasio J    08/10/20 2120 08/10/20 2119    Once     Route: Intravenous  Ordered Dose: 1,000 mL     Discontinued SANDOVAL, LYNETTE R             MDM  Number of Diagnoses or Management Options  AKI (acute kidney injury)  Drug-induced hyperglycemia  Diagnosis management comments: This is a 39 year old female with past medical history of diabetes, asthma, CKD and CHF who presents for hyperglycemia and cough.  At time of presentation, patient was borderline tachycardic.  Work-up was initiated including labs to evaluate for DKA, CHF exacerbation or PE.    Labs reviewed and show Urinalysis with protein, glucose and trace ketones, elevated creatinine at 2.2 increased from prior 1.2 or 1.9 seen in care everywhere with pseudohyponatremia at 128, leukocytosis and anemia 10.4  Pulse ox reviewed and is normal at this time.  Patient EKG interpreted by ED physician shows Normal sinus rhythm at a ventricular rate of 93 with minimal voltage criteria for LVH, normal axis, and no new ST changes, no prior EKG for comparison.  Patient's old records were reviewed.  Per care everywhere, patient previously saw a nephrologist earlier this year and at that time creatinine was between 1.2 and 1.9.  Per nephrologist note, patient had a PE last year and multiple visits to the emergency department for fluid overload  Imaging reviewed by myself and radiology and shows no acute cardiopulmonary process on chest x-ray.    Given patient's complex medical history including CHF but with AKI requiring fluids asthma exacerbation requiring steroids but patient already hyperglycemic and inability to tolerate oral intake, hospitalist was contacted and case was discussed for observation  and observation was accepted.  Patient was updated and agreeable.  Patient mildly improved after breathing treatment.  Patient placed in observation        ED Course as of Aug 13 1337   Sat Aug 11, 2020   [address] Dr Clent Ridges accepts to [facility]    [AD]      ED Course User Index  [AD] Tacy Dura, Etta Quill, MD     Critical care  The services I provided to this patient were to treat and/or prevent clinically significant deterioration that could result in the failure of one or more body systems and/or organs.    Services included the following: chart data review, reviewing nursing notes and/or old charts, documentation time, consultant collaboration regarding findings and treatment options, medication orders and management, direct patient care, re-evaluations, vital sign assessments and ordering, interpreting and reviewing diagnostic studies/lab tests.    Aggregate critical care time was 42 minutes, which includes only time during which I was engaged in work directly related to the patient's care, as described above, whether at the bedside or elsewhere in the Emergency Department.  It did not include time  spent performing other reported procedures or the services of residents, students, nurses or physician assistants      Procedures    Clinical Impression & Disposition     Clinical Impression  Final diagnoses:   AKI (acute kidney injury)   Exacerbation of asthma, unspecified asthma severity, unspecified whether persistent   Hyperglycemia        ED Disposition     ED Disposition Condition Date/Time Comment    Observation  Sat Aug 11, 2020 12:06 AM Admitting Physician: Karlton Lemon [phone]   Service:: Medicine [[id number]]   Estimated Length of Stay: < 2 midnights   Tentative Discharge Plan?: Home or Self Care [1]   Does patient need telemetry?: Yes   Telemetry type (separate Telemetry order is also required):: Cardiac telemetry             Current Discharge Medication List

## 2020-08-11 ENCOUNTER — Inpatient Hospital Stay: Payer: No Typology Code available for payment source

## 2020-08-11 ENCOUNTER — Encounter: Payer: Self-pay | Admitting: Internal Medicine

## 2020-08-11 DIAGNOSIS — N179 Acute kidney failure, unspecified: Secondary | ICD-10-CM | POA: Diagnosis present

## 2020-08-11 DIAGNOSIS — N183 Chronic kidney disease, stage 3 unspecified: Secondary | ICD-10-CM | POA: Diagnosis present

## 2020-08-11 DIAGNOSIS — R739 Hyperglycemia, unspecified: Secondary | ICD-10-CM | POA: Diagnosis present

## 2020-08-11 LAB — CBC
Absolute NRBC: 0 10*3/uL (ref 0.00–0.00)
Hematocrit: 30.5 % — ABNORMAL LOW (ref 34.7–43.7)
Hgb: 9.6 g/dL — ABNORMAL LOW (ref 11.4–14.8)
MCH: 25.8 pg (ref 25.1–33.5)
MCHC: 31.5 g/dL (ref 31.5–35.8)
MCV: 82 fL (ref 78.0–96.0)
MPV: 11.5 fL (ref 8.9–12.5)
Nucleated RBC: 0 /100 WBC (ref 0.0–0.0)
Platelets: 353 10*3/uL — ABNORMAL HIGH (ref 142–346)
RBC: 3.72 10*6/uL — ABNORMAL LOW (ref 3.90–5.10)
RDW: 12 % (ref 11–15)
WBC: 12.46 10*3/uL — ABNORMAL HIGH (ref 3.10–9.50)

## 2020-08-11 LAB — HEMOLYSIS INDEX
Hemolysis Index: 0 (ref 0–18)
Hemolysis Index: 3 (ref 0–18)
Hemolysis Index: 11 (ref 0–18)
Hemolysis Index: 5 (ref 0–18)

## 2020-08-11 LAB — MAGNESIUM: Magnesium: 2.3 mg/dL (ref 1.6–2.6)

## 2020-08-11 LAB — LIPID PANEL
Cholesterol / HDL Ratio: 4.9
Cholesterol: 175 mg/dL (ref 0–199)
HDL: 36 mg/dL — ABNORMAL LOW (ref 40–9999)
LDL Calculated: 122 mg/dL — ABNORMAL HIGH (ref 0–99)
Triglycerides: 86 mg/dL (ref 34–149)
VLDL Calculated: 17 mg/dL (ref 10–40)

## 2020-08-11 LAB — BASIC METABOLIC PANEL
Anion Gap: 12 (ref 5.0–15.0)
Anion Gap: 17 — ABNORMAL HIGH (ref 5.0–15.0)
Anion Gap: 13 (ref 5.0–15.0)
Anion Gap: 15 (ref 5.0–15.0)
BUN: 80 mg/dL — ABNORMAL HIGH (ref 7.0–19.0)
BUN: 75 mg/dL — ABNORMAL HIGH (ref 7.0–19.0)
BUN: 84 mg/dL — ABNORMAL HIGH (ref 7.0–19.0)
BUN: 81 mg/dL — ABNORMAL HIGH (ref 7.0–19.0)
CO2: 22 mEq/L (ref 22–29)
CO2: 16 mEq/L — ABNORMAL LOW (ref 22–29)
CO2: 21 mEq/L — ABNORMAL LOW (ref 22–29)
CO2: 19 mEq/L — ABNORMAL LOW (ref 22–29)
Calcium: 9.4 mg/dL (ref 8.5–10.5)
Calcium: 9.1 mg/dL (ref 8.5–10.5)
Calcium: 9.7 mg/dL (ref 8.5–10.5)
Calcium: 9.4 mg/dL (ref 8.5–10.5)
Chloride: 93 mEq/L — ABNORMAL LOW (ref 100–111)
Chloride: 94 mEq/L — ABNORMAL LOW (ref 100–111)
Chloride: 93 mEq/L — ABNORMAL LOW (ref 100–111)
Chloride: 93 mEq/L — ABNORMAL LOW (ref 100–111)
Creatinine: 2.1 mg/dL — ABNORMAL HIGH (ref 0.6–1.0)
Creatinine: 2.1 mg/dL — ABNORMAL HIGH (ref 0.6–1.0)
Creatinine: 2.3 mg/dL — ABNORMAL HIGH (ref 0.6–1.0)
Creatinine: 2.3 mg/dL — ABNORMAL HIGH (ref 0.6–1.0)
Glucose: 598 mg/dL (ref 70–100)
Glucose: 652 mg/dL (ref 70–100)
Glucose: 651 mg/dL (ref 70–100)
Glucose: 593 mg/dL (ref 70–100)
Potassium: 5.1 mEq/L (ref 3.5–5.1)
Potassium: 5.1 mEq/L (ref 3.5–5.1)
Potassium: 4.8 mEq/L (ref 3.5–5.1)
Potassium: 4.7 mEq/L (ref 3.5–5.1)
Sodium: 127 mEq/L — ABNORMAL LOW (ref 136–145)
Sodium: 127 mEq/L — ABNORMAL LOW (ref 136–145)
Sodium: 127 mEq/L — ABNORMAL LOW (ref 136–145)
Sodium: 127 mEq/L — ABNORMAL LOW (ref 136–145)

## 2020-08-11 LAB — PT/INR
PT INR: 1.2 — ABNORMAL HIGH (ref 0.9–1.1)
PT: 13.6 s — ABNORMAL HIGH (ref 10.1–12.9)

## 2020-08-11 LAB — GLUCOSE WHOLE BLOOD - POCT
Whole Blood Glucose POCT: 379 mg/dL — ABNORMAL HIGH (ref 70–100)
Whole Blood Glucose POCT: >600 mg/dL (ref 70–100)
Whole Blood Glucose POCT: >600 mg/dL (ref 70–100)
Whole Blood Glucose POCT: 550 mg/dL (ref 70–100)
Whole Blood Glucose POCT: 573 mg/dL (ref 70–100)
Whole Blood Glucose POCT: >600 mg/dL (ref 70–100)
Whole Blood Glucose POCT: 509 mg/dL (ref 70–100)
Whole Blood Glucose POCT: 518 mg/dL (ref 70–100)
Whole Blood Glucose POCT: 462 mg/dL — ABNORMAL HIGH (ref 70–100)

## 2020-08-11 LAB — PROCALCITONIN: Procalcitonin: 0.09 (ref 0.00–0.10)

## 2020-08-11 LAB — URINALYSIS REFLEX TO MICROSCOPIC EXAM - REFLEX TO CULTURE
Bilirubin, UA: NEGATIVE
Glucose, UA: 500 — AB
Leukocyte Esterase, UA: NEGATIVE
Nitrite, UA: NEGATIVE
Protein, UR: >=500 — AB
Specific Gravity UA: 1.018 (ref 1.001–1.035)
Urine pH: 6 (ref 5.0–8.0)
Urobilinogen, UA: NEGATIVE mg/dL (ref 0.2–2.0)

## 2020-08-11 LAB — ECG 12-LEAD
Atrial Rate: 93 {beats}/min
P Axis: 41 degrees
P-R Interval: 130 ms
Q-T Interval: 374 ms
QRS Duration: 86 ms
QTC Calculation (Bezet): 465 ms
R Axis: 13 degrees
T Axis: 15 degrees
Ventricular Rate: 93 {beats}/min

## 2020-08-11 LAB — OSMOLALITY: Osmolality: 322 mosm/kg — ABNORMAL HIGH (ref 280–300)

## 2020-08-11 LAB — B-TYPE NATRIURETIC PEPTIDE
B-Natriuretic Peptide: <10 pg/mL (ref 0–100)
B-Natriuretic Peptide: <10 pg/mL (ref 0–100)

## 2020-08-11 LAB — GFR
EGFR: 25.9
EGFR: 25.9
EGFR: 23.4
EGFR: 23.4

## 2020-08-11 LAB — OSMOLALITY, URINE: Urine Osmolality: 479 mosm/kg (ref 300–1094)

## 2020-08-11 LAB — PHOSPHORUS: Phosphorus: 3.4 mg/dL (ref 2.3–4.7)

## 2020-08-11 LAB — HEMOGLOBIN A1C: Hemoglobin A1C: >14 % — ABNORMAL HIGH (ref 4.6–5.9)

## 2020-08-11 LAB — TROPONIN I
Troponin I: <0.01 ng/mL (ref 0.00–0.05)
Troponin I: <0.01 ng/mL (ref 0.00–0.05)
Troponin I: <0.01 ng/mL (ref 0.00–0.05)

## 2020-08-11 LAB — SODIUM, URINE, RANDOM: Urine Sodium Random: 34 mEq/L

## 2020-08-11 LAB — IHS D-DIMER: D-Dimer: 0.23 ug/mL FEU (ref 0.00–0.60)

## 2020-08-11 LAB — TSH: TSH: 0.64 u[IU]/mL (ref 0.35–4.94)

## 2020-08-11 MED ORDER — ATORVASTATIN CALCIUM 20 MG PO TABS
20.0000 mg | ORAL_TABLET | Freq: Every evening | ORAL | Status: DC
Start: 2020-08-11 — End: 2020-08-13
  Administered 2020-08-11 – 2020-08-12 (×2): 20 mg via ORAL
  Filled 2020-08-11 (×2): qty 1

## 2020-08-11 MED ORDER — ACETAMINOPHEN 325 MG PO TABS
650.0000 mg | ORAL_TABLET | Freq: Four times a day (QID) | ORAL | Status: DC | PRN
Start: 2020-08-11 — End: 2020-08-12
  Administered 2020-08-12: 650 mg via ORAL
  Filled 2020-08-11: qty 2

## 2020-08-11 MED ORDER — DEXTROSE 50 % IV SOLN
50.0000 mL | INTRAVENOUS | Status: DC | PRN
Start: 2020-08-11 — End: 2020-08-12

## 2020-08-11 MED ORDER — DEXTROSE 50 % IV SOLN
25.0000 mL | INTRAVENOUS | Status: DC | PRN
Start: 2020-08-11 — End: 2020-08-11

## 2020-08-11 MED ORDER — GLUCOSE 40 % PO GEL
15.0000 g | ORAL | Status: DC | PRN
Start: 2020-08-11 — End: 2020-08-13

## 2020-08-11 MED ORDER — INSULIN LISPRO 100 UNIT/ML SC SOLN
20.0000 [IU] | SUBCUTANEOUS | Status: DC
Start: 2020-08-11 — End: 2020-08-11

## 2020-08-11 MED ORDER — ONDANSETRON HCL 4 MG/2ML IJ SOLN
4.0000 mg | Freq: Once | INTRAMUSCULAR | Status: DC | PRN
Start: 2020-08-11 — End: 2020-08-11

## 2020-08-11 MED ORDER — GABAPENTIN 300 MG PO CAPS
600.0000 mg | ORAL_CAPSULE | Freq: Three times a day (TID) | ORAL | Status: DC
Start: 2020-08-11 — End: 2020-08-13
  Administered 2020-08-11 – 2020-08-13 (×6): 600 mg via ORAL
  Filled 2020-08-11 (×6): qty 2

## 2020-08-11 MED ORDER — INSULIN LISPRO 100 UNIT/ML SC SOLN
10.0000 [IU] | Freq: Three times a day (TID) | SUBCUTANEOUS | Status: DC
Start: 2020-08-11 — End: 2020-08-11
  Administered 2020-08-11: 11:00:00 10 [IU] via SUBCUTANEOUS
  Filled 2020-08-11: qty 30

## 2020-08-11 MED ORDER — ENOXAPARIN SODIUM 40 MG/0.4ML SC SOLN
40.0000 mg | Freq: Every day | SUBCUTANEOUS | Status: DC
Start: 2020-08-11 — End: 2020-08-11

## 2020-08-11 MED ORDER — APIXABAN 5 MG PO TABS
5.0000 mg | ORAL_TABLET | Freq: Two times a day (BID) | ORAL | Status: DC
Start: 2020-08-11 — End: 2020-08-13
  Administered 2020-08-11 – 2020-08-13 (×6): 5 mg via ORAL
  Filled 2020-08-11 (×6): qty 1

## 2020-08-11 MED ORDER — GABAPENTIN 300 MG PO CAPS
300.0000 mg | ORAL_CAPSULE | Freq: Three times a day (TID) | ORAL | Status: DC
Start: 2020-08-11 — End: 2020-08-11
  Administered 2020-08-11 (×2): 300 mg via ORAL
  Filled 2020-08-11 (×2): qty 1

## 2020-08-11 MED ORDER — INSULIN LISPRO 100 UNIT/ML SC SOLN
1.0000 [IU] | Freq: Three times a day (TID) | SUBCUTANEOUS | Status: DC
Start: 2020-08-11 — End: 2020-08-11
  Administered 2020-08-11 (×2): 5 [IU] via SUBCUTANEOUS
  Filled 2020-08-11 (×2): qty 15

## 2020-08-11 MED ORDER — INSULIN LISPRO 100 UNIT/ML SC SOLN
7.0000 [IU] | Freq: Once | SUBCUTANEOUS | Status: AC
Start: 2020-08-11 — End: 2020-08-11
  Administered 2020-08-11: 06:00:00 7 [IU] via SUBCUTANEOUS
  Filled 2020-08-11: qty 21

## 2020-08-11 MED ORDER — MELATONIN 3 MG PO TABS
3.0000 mg | ORAL_TABLET | Freq: Every evening | ORAL | Status: DC | PRN
Start: 2020-08-11 — End: 2020-08-13
  Administered 2020-08-11 (×2): 3 mg via ORAL
  Filled 2020-08-11 (×2): qty 1

## 2020-08-11 MED ORDER — ACETAMINOPHEN 325 MG PO TABS
650.0000 mg | ORAL_TABLET | Freq: Four times a day (QID) | ORAL | Status: DC | PRN
Start: 2020-08-11 — End: 2020-08-11

## 2020-08-11 MED ORDER — GLUCOSE 40 % PO GEL
15.0000 g | ORAL | Status: DC | PRN
Start: 2020-08-11 — End: 2020-08-11

## 2020-08-11 MED ORDER — SODIUM CHLORIDE 0.9 % IV SOLN
INTRAVENOUS | Status: DC
Start: 2020-08-11 — End: 2020-08-12

## 2020-08-11 MED ORDER — PANTOPRAZOLE SODIUM 40 MG PO TBEC
40.0000 mg | DELAYED_RELEASE_TABLET | Freq: Every morning | ORAL | Status: DC
Start: 2020-08-11 — End: 2020-08-13
  Administered 2020-08-11 – 2020-08-13 (×3): 40 mg via ORAL
  Filled 2020-08-11 (×3): qty 1

## 2020-08-11 MED ORDER — DEXTROSE 50 % IV SOLN
12.5000 g | INTRAVENOUS | Status: DC | PRN
Start: 2020-08-11 — End: 2020-08-13

## 2020-08-11 MED ORDER — GABAPENTIN 300 MG PO CAPS
300.0000 mg | ORAL_CAPSULE | Freq: Once | ORAL | Status: AC
Start: 2020-08-11 — End: 2020-08-11
  Administered 2020-08-11: 16:00:00 300 mg via ORAL
  Filled 2020-08-11: qty 1

## 2020-08-11 MED ORDER — GLUCAGON 1 MG IJ SOLR (WRAP)
1.0000 mg | INTRAMUSCULAR | Status: DC | PRN
Start: 2020-08-11 — End: 2020-08-13

## 2020-08-11 MED ORDER — INSULIN GLARGINE 100 UNIT/ML SC SOLN
40.0000 [IU] | Freq: Two times a day (BID) | SUBCUTANEOUS | Status: DC
Start: 2020-08-11 — End: 2020-08-11

## 2020-08-11 MED ORDER — NALOXONE HCL 0.4 MG/ML IJ SOLN (WRAP)
0.2000 mg | INTRAMUSCULAR | Status: DC | PRN
Start: 2020-08-11 — End: 2020-08-13

## 2020-08-11 MED ORDER — METHYLPREDNISOLONE SODIUM SUCC 40 MG IJ SOLR
40.0000 mg | Freq: Three times a day (TID) | INTRAMUSCULAR | Status: DC
Start: 2020-08-11 — End: 2020-08-11
  Administered 2020-08-11: 06:00:00 40 mg via INTRAVENOUS
  Filled 2020-08-11: qty 1

## 2020-08-11 MED ORDER — ALBUTEROL-IPRATROPIUM 2.5-0.5 (3) MG/3ML IN SOLN
3.0000 mL | RESPIRATORY_TRACT | Status: DC
Start: 2020-08-11 — End: 2020-08-12
  Filled 2020-08-11: qty 3

## 2020-08-11 MED ORDER — ALBUTEROL-IPRATROPIUM 2.5-0.5 (3) MG/3ML IN SOLN
3.0000 mL | Freq: Four times a day (QID) | RESPIRATORY_TRACT | Status: DC
Start: 2020-08-11 — End: 2020-08-11
  Administered 2020-08-11 (×3): 3 mL via RESPIRATORY_TRACT
  Filled 2020-08-11 (×2): qty 3

## 2020-08-11 MED ORDER — DEXTROSE 50 % IV SOLN
12.5000 g | INTRAVENOUS | Status: DC | PRN
Start: 2020-08-11 — End: 2020-08-11

## 2020-08-11 MED ORDER — INSULIN GLARGINE 100 UNIT/ML SC SOLN
20.0000 [IU] | Freq: Two times a day (BID) | SUBCUTANEOUS | Status: DC
Start: 2020-08-11 — End: 2020-08-11
  Administered 2020-08-11: 09:00:00 20 [IU] via SUBCUTANEOUS
  Filled 2020-08-11: qty 20

## 2020-08-11 MED ORDER — SODIUM CHLORIDE 0.9 % IV BOLUS
500.0000 mL | Freq: Once | INTRAVENOUS | Status: AC
Start: 2020-08-11 — End: 2020-08-11
  Administered 2020-08-11: 06:00:00 500 mL via INTRAVENOUS

## 2020-08-11 MED ORDER — INSULIN LISPRO 100 UNIT/ML SC SOLN
20.0000 [IU] | Freq: Once | SUBCUTANEOUS | Status: AC
Start: 2020-08-11 — End: 2020-08-11
  Administered 2020-08-11: 13:00:00 20 [IU] via SUBCUTANEOUS
  Filled 2020-08-11: qty 60

## 2020-08-11 MED ORDER — INSULIN REGULAR 100 UNITS IN 100 ML NS (PREMIX)
0.1400 [IU]/kg/h | INTRAVENOUS | Status: DC
Start: 2020-08-11 — End: 2020-08-12
  Administered 2020-08-11: 21:00:00 17.402 [IU]/h via INTRAVENOUS
  Administered 2020-08-12: 07:00:00 4 [IU]/kg/h via INTRAVENOUS
  Filled 2020-08-11 (×2): qty 100

## 2020-08-11 MED ORDER — ACETAMINOPHEN 650 MG RE SUPP
650.0000 mg | Freq: Four times a day (QID) | RECTAL | Status: DC | PRN
Start: 2020-08-11 — End: 2020-08-12

## 2020-08-11 MED ORDER — INSULIN GLARGINE 100 UNIT/ML SC SOLN
20.0000 [IU] | Freq: Every evening | SUBCUTANEOUS | Status: DC
Start: 2020-08-11 — End: 2020-08-11

## 2020-08-11 MED ORDER — NITROGLYCERIN 0.4 MG SL SUBL
0.4000 mg | SUBLINGUAL_TABLET | SUBLINGUAL | Status: DC | PRN
Start: 2020-08-11 — End: 2020-08-13

## 2020-08-11 MED ORDER — HYDRALAZINE HCL 50 MG PO TABS
50.0000 mg | ORAL_TABLET | Freq: Two times a day (BID) | ORAL | Status: DC
Start: 2020-08-11 — End: 2020-08-13
  Administered 2020-08-11 – 2020-08-13 (×5): 50 mg via ORAL
  Filled 2020-08-11 (×7): qty 1

## 2020-08-11 MED ORDER — ALBUTEROL-IPRATROPIUM 2.5-0.5 (3) MG/3ML IN SOLN
3.0000 mL | RESPIRATORY_TRACT | Status: DC | PRN
Start: 2020-08-11 — End: 2020-08-13
  Administered 2020-08-12: 05:00:00 3 mL via RESPIRATORY_TRACT
  Filled 2020-08-11: qty 3

## 2020-08-11 MED ORDER — ONDANSETRON HCL 4 MG/2ML IJ SOLN
4.0000 mg | Freq: Four times a day (QID) | INTRAMUSCULAR | Status: DC | PRN
Start: 2020-08-11 — End: 2020-08-13
  Administered 2020-08-11: 21:00:00 4 mg via INTRAVENOUS
  Filled 2020-08-11: qty 2

## 2020-08-11 MED ORDER — INSULIN LISPRO 100 UNIT/ML SC SOLN
2.0000 [IU] | SUBCUTANEOUS | Status: DC
Start: 2020-08-11 — End: 2020-08-11
  Administered 2020-08-11: 17:00:00 10 [IU] via SUBCUTANEOUS
  Filled 2020-08-11: qty 30

## 2020-08-11 MED ORDER — ONDANSETRON 4 MG PO TBDP
4.0000 mg | ORAL_TABLET | Freq: Four times a day (QID) | ORAL | Status: DC | PRN
Start: 2020-08-11 — End: 2020-08-13

## 2020-08-11 MED ORDER — SODIUM CHLORIDE 0.45 % IV SOLN
INTRAVENOUS | Status: DC
Start: 2020-08-11 — End: 2020-08-11

## 2020-08-11 MED ORDER — SENNOSIDES-DOCUSATE SODIUM 8.6-50 MG PO TABS
1.0000 | ORAL_TABLET | Freq: Two times a day (BID) | ORAL | Status: DC
Start: 2020-08-11 — End: 2020-08-13
  Administered 2020-08-11 – 2020-08-13 (×4): 1 via ORAL
  Filled 2020-08-11 (×4): qty 1

## 2020-08-11 MED ORDER — ACETAMINOPHEN 325 MG PO TABS
650.0000 mg | ORAL_TABLET | Freq: Once | ORAL | Status: DC | PRN
Start: 2020-08-11 — End: 2020-08-11

## 2020-08-11 MED ORDER — CARVEDILOL 12.5 MG PO TABS
25.0000 mg | ORAL_TABLET | Freq: Two times a day (BID) | ORAL | Status: DC
Start: 2020-08-11 — End: 2020-08-13
  Administered 2020-08-11 – 2020-08-13 (×5): 25 mg via ORAL
  Filled 2020-08-11 (×3): qty 2
  Filled 2020-08-11: qty 1
  Filled 2020-08-11: qty 2

## 2020-08-11 MED ORDER — GLUCAGON 1 MG IJ SOLR (WRAP)
1.0000 mg | INTRAMUSCULAR | Status: DC | PRN
Start: 2020-08-11 — End: 2020-08-11

## 2020-08-11 MED ORDER — INSULIN SYRINGES (DISPOSABLE) U-100 1 ML MISC
0.1000 [IU]/kg | Freq: Once | Status: DC
Start: 2020-08-11 — End: 2020-08-11

## 2020-08-11 MED ORDER — INSULIN LISPRO 100 UNIT/ML SC SOLN
1.0000 [IU] | Freq: Every evening | SUBCUTANEOUS | Status: DC
Start: 2020-08-11 — End: 2020-08-11

## 2020-08-11 MED ORDER — DEXTROSE 50 % IV SOLN
50.0000 mL | INTRAVENOUS | Status: DC | PRN
Start: 2020-08-11 — End: 2020-08-11

## 2020-08-11 MED ORDER — INSULIN REGULAR 100 UNITS IN 100 ML NS (PREMIX)
0.1000 [IU]/kg/h | INTRAVENOUS | Status: DC
Start: 2020-08-11 — End: 2020-08-11

## 2020-08-11 MED ORDER — ACETAMINOPHEN 650 MG RE SUPP
650.0000 mg | Freq: Four times a day (QID) | RECTAL | Status: DC | PRN
Start: 2020-08-11 — End: 2020-08-11

## 2020-08-11 MED ORDER — BUMETANIDE 1 MG PO TABS
2.0000 mg | ORAL_TABLET | Freq: Two times a day (BID) | ORAL | Status: DC
Start: 2020-08-11 — End: 2020-08-11
  Administered 2020-08-11: 10:00:00 2 mg via ORAL
  Filled 2020-08-11: qty 2

## 2020-08-11 MED ORDER — INSULIN LISPRO 100 UNIT/ML SC SOLN
10.0000 [IU] | Freq: Once | SUBCUTANEOUS | Status: AC
Start: 2020-08-11 — End: 2020-08-11
  Administered 2020-08-11: 09:00:00 10 [IU] via SUBCUTANEOUS
  Filled 2020-08-11: qty 30

## 2020-08-11 MED ORDER — DEXTROSE 50 % IV SOLN
25.0000 mL | INTRAVENOUS | Status: DC | PRN
Start: 2020-08-11 — End: 2020-08-12

## 2020-08-11 NOTE — Progress Notes (Signed)
Patient back from renal U/S. Blood sugar rechecked above 600. Dr Mariah Milling notified. Rounds and discussed plan of care with patient. Steroids discontinued. Will continue to monitor patient.

## 2020-08-11 NOTE — Consults (Signed)
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 S/p hysterectomy.   Leukocytosis without fever, likely reactive  Asthma  Hyperlipidemia  Plan:   Admit to ICU  Start on insulin infusion per protocol  IV hydration  Monitor renal function. Strict I&O. Replace electrolytes as needed  Renal US pending  Continue eliquis  Monitor for signs of fluid overload  Continue Coreg  Hydralazine for HTN  Monitor H&H. No signs of bleeding.  Continue timed duo-nebs. Discontinue IV steroids.  Atorvastatin 20 mh nightly    History:     [name] is a 39 y.o. female who presents to the hospital on 08/10/2020 with 2 day history of nausea and vomiting with abdominal cramping. She also states she has been having cough, SOB, and chest pain that feels like she has a "fist in her chest".   PMH of Type 1.5 diabetes, hypertension, PE on eliquis, CHF, asthma, blood loss anemia, hyperlipidemia, and morbid obesity.    Patient is also reporting shortness of breath with wheezing.  She has history of asthma, tried home inhalers and nebs treatment without response. Patient has completed 2nd Moderna COVID vaccination dose in March, 2021    Patient states that when she lived in [place] her blood glucose was well controlled. When she moved to [place] about a year ago, she had to change regimen due to insurance coverage. She has been working with her endocrinologist, but she has yet to find a regimen that  keeps her well controlled.  Her current regime is Victoza, Metformin, humalog, and lantus.     She was originally admitted to the floor with hyperglycemia in the 300's and asthma exacerbation for which she received IV steroids.  Her following blood glucose went > 600 without response to SQ insulin administration every 4 hours.    She is being admitted to the ICU for insulin infusion and hourly blood glucose monitoring.    Past Medical History:   No past medical history on file.    Past Surgical History:   No past surgical history on file.    Family History:   No family history on file.    Social History:     Social History     Socioeconomic History   . Marital status: Divorced     Spouse name: Not on file   . Number of children: Not on file   . Years of education: Not on file   . Highest education level: Not on file   Occupational History   . Not on file   Tobacco Use   . Smoking status: Not on file   Substance and Sexual Activity   . Alcohol use: Not on file   . Drug use: Not on file   . Sexual activity: Not on file   Other Topics Concern   . Not on file   Social History Narrative   .  Not on file     Social Determinants of Health     Financial Resource Strain:    . Difficulty of Paying Living Expenses:    Food Insecurity:    . Worried About Programme researcher, broadcasting/film/video in the Last Year:    . Barista in the Last Year:    Transportation Needs:    . Freight forwarder (Medical):    Marland Kitchen Lack of Transportation (Non-Medical):    Physical Activity:    . Days of Exercise per Week:    . Minutes of Exercise per Session:    Stress:    . Feeling of Stress :    Social Connections:    . Frequency of Communication with Friends and Family:    . Frequency of Social Gatherings with Friends and Family:    . Attends Religious Services:    . Active Member of Clubs or Organizations:    . Attends Banker Meetings:    Marland Kitchen Marital Status:    Intimate Partner Violence:    . Fear of Current or Ex-Partner:    . Emotionally Abused:     Marland Kitchen Physically Abused:    . Sexually Abused:        Allergies:     Allergies   Allergen Reactions   . Bactrim [Sulfamethoxazole-Trimethoprim] Hives, Respiratory Distress and Edema       Hospital Medications:     Current Facility-Administered Medications   Medication Dose Route Frequency   . albuterol-ipratropium  3 mL Nebulization Q4H SCH   . apixaban  5 mg Oral Q12H   . atorvastatin  20 mg Oral QHS   . carvedilol  25 mg Oral Q12H SCH   . gabapentin  600 mg Oral TID   . hydrALAZINE  50 mg Oral BID   . insulin glargine  40 Units Subcutaneous Q12H   . insulin lispro  2-10 Units Subcutaneous Q4H SCH   . insulin lispro  20 Units Subcutaneous Q4H   . pantoprazole  40 mg Oral QAM AC       Home Medications:     Medications Prior to Admission   Medication Sig Dispense Refill Last Dose   . apixaban (ELIQUIS) 5 MG Take 5 mg by mouth every 12 (twelve) hours   Past Week at 10PM   . atorvastatin (LIPITOR) 20 MG tablet Take 20 mg by mouth nightly   Past Week at 10PM   . baclofen (LIORESAL) 10 MG tablet Take 10 mg by mouth as needed   Past Week at 10pm   . bumetanide (BUMEX) 2 MG tablet Take 2 mg by mouth 2 (two) times daily   Past Week at 10pm   . carvedilol (COREG) 25 MG tablet Take 25 mg by mouth 2 (two) times daily   Past Week at 10PM   . gabapentin (NEURONTIN) 600 MG tablet Take 600 mg by mouth 3 (three) times daily   Past Week at 10PM   . hydrALAZINE (APRESOLINE) 50 MG tablet Take 50 mg by mouth 2 (two) times daily   Past Week at 10pm   . hydrOXYzine (ATARAX) 10 MG tablet Take 10 mg by mouth every 6 (six) hours as needed for Anxiety   Past Week at 10pm   . omeprazole (PriLOSEC) 40 MG capsule Take 40 mg by mouth nightly   Past Week at 10pm       Code Status:      full code    Review of Systems:  General ROS:  Afebrile no weight loss weight gain   ENT ROS:  No sore throat no nasal discharge   Endocrine ROS:  Endorses fatigue   Respiratory ROS:  Endorses shortness of breath and cough   Cardiovascular ROS:  No chest  pain or palpitation   Gastrointestinal ROS:  No nausea vomiting diarrhea.  No melanotic stool   Genito-Urinary ROS:  No burning in the urine or hematuria   Musculoskeletal ROS:  No musculoskeletal deformities   Neurological ROS:  No stroke seizure disorder   Dermatological ROS:  No skin rash      Physical Exam:     Vitals:    08/11/20 1720   BP: 142/85   Pulse:    Resp:    Temp:    SpO2:          Intake/Output Summary (Last 24 hours) at 08/11/2020 1840  Last data filed at 08/11/2020 1500  Gross per 24 hour   Intake 1260 ml   Output 600 ml   Net 660 ml           General appearance - no visible respiratory distress patient does not appear toxic  Mental status - Alert and oriented x3  Eyes - EOMI PERRLA  Nose - no nasal discharge  Mouth - mucous membrane is moist.  No evidence of sore throat  Neck - no JVD lymphadenopathy or thyromegaly  Chest - clear to auscultation  Heart - S1-S2 RRR no S3-S4 no murmur  Abdomen - soft, obese, nontender bowel sounds are normal with no hepatosplenomegaly  Neurological - no motor or sensory deficit  Extremities - no edema clubbing or cyanosis  Skin - no skin rash  Capillary refill time less than 3 seconds  Labs:       CBC w/Diff CMP   Recent Labs   Lab 08/11/20  0255 08/10/20  2159   WBC 12.46* 13.09*   Hgb 9.6* 10.4*   Hematocrit 30.5* 32.9*   Platelets 353* 404*   MCV 82.0 81.8   Neutrophils  --  61.8       PT/INR   Recent Labs   Lab 08/11/20  0848   PT INR 1.2*       Recent Labs   Lab 08/11/20  1608 08/11/20  0848 08/11/20  0255 08/10/20  2159 08/10/20  2159   Sodium 127* 127* 127*  More results in Results Review 128*   Potassium 4.8 5.1 5.1  More results in Results Review 4.3   Chloride 93* 94* 93*  More results in Results Review 91*   CO2 21* 16* 22  More results in Results Review 24   BUN 84.0* 75.0* 80.0*  More results in Results Review 82.0*   Creatinine 2.3* 2.1* 2.1*  More results in Results Review 2.2*   Glucose 651* 652* 598*  More results in Results Review 398*   Calcium  9.7 9.1 9.4  More results in Results Review 9.6   Protein, Total  --   --   --   --  7.8   Albumin  --   --   --   --  3.6   AST (SGOT)  --   --   --   --  11   ALT  --   --   --   --  17   Alkaline Phosphatase  --   --   --   --  104   Bilirubin, Total  --   --   --   --  0.3   More results in Results Review = values in this interval not displayed.      Glucose POCT   Recent Labs   Lab 08/11/20  1608 08/11/20  0848 08/11/20  0255 08/10/20  2159   Glucose 651* 652* 598* 398*        Recent Labs   Lab 08/11/20  0848 08/11/20  0255 08/10/20  2159   Troponin I <0.01 <0.01  <0.01 <0.01             ABGs:  No results found for: Paul Dykes, PHART, PCO2ART, PO2ART, HCO3ART, BEART, O2SATART    Urinalysis  Recent Labs   Lab 08/11/20  0848   Urine Type Urine, Clean Ca   Color, UA Straw   Clarity, UA Clear   Specific Gravity UA 1.018   Urine pH 6.0   Nitrite, UA Negative   Ketones UA Trace*   Urobilinogen, UA Negative   Bilirubin, UA Negative   Blood, UA Small*   RBC, UA 0 - 2   WBC, UA 0 - 5         Rads:     Radiology Results (24 Hour)     Procedure Component Value Units Date/Time    US Renal Kidney [[id number]] Resulted: 08/11/20 1128    Order Status: Sent Updated: 08/11/20 1202      .    Jackquline Berlin MD  08/11/2020  6:40 PM

## 2020-08-11 NOTE — Progress Notes (Signed)
Blood sugar 573. Dr Mariah Milling notified. Insulin given as ordered.

## 2020-08-11 NOTE — Progress Notes (Signed)
Patient wanted to go to ICU due her elevated blood sugar. Dr Mariah Milling made aware. Came talked to the patient and her daughter over the phone. Discussed plan of care. Started on IV fluids. Will monitor blood sugar every 4 hours. Increased blood sugar coverage. Patient agreeable to stay in the floor. Will continue to monitor.

## 2020-08-11 NOTE — Progress Notes (Signed)
Blood sugar check 550. Dr Mariah Milling notified placed new orders.

## 2020-08-11 NOTE — Plan of Care (Signed)
Problem: Safety  Goal: Patient will be free from injury during hospitalization  Flowsheets (Taken 08/11/2020 0444)  Patient will be free from injury during hospitalization:   Assess patient's risk for falls and implement fall prevention plan of care per policy   Provide and maintain safe environment   Use appropriate transfer methods   Ensure appropriate safety devices are available at the bedside   Include patient/ family/ care giver in decisions related to safety   Hourly rounding     Problem: Pain  Goal: Pain at adequate level as identified by patient  Outcome: Progressing  Flowsheets (Taken 08/11/2020 0445)  Pain at adequate level as identified by patient:   Identify patient comfort function goal   Evaluate if patient comfort function goal is met     Problem: Diabetes: Glucose Imbalance  Goal: Blood glucose stable at established goal  Outcome: Progressing     A&OX4, afebrile. NSR/ST in the 90's-100's & SBP in the 140's-150's. Denied of pain or discomfort. Admitted for AKI. Oriented to room & unit. Fall safety precautions implemented. Bed in lowest position, call light within reach & bed exit alarm on. Pt encouraged to call for any assistance needed.

## 2020-08-11 NOTE — UM Notes (Addendum)
[employer]      Copy of Admission Order:  Admit to Inpatient (Order [id number]) 08/11/20 1012  Diagnosis: Drug-Induced Hyperglycemia    Level of Care: Acute    Patient Class: Inpatient       References: IAH Bed Placement CriteriaIFMC Bed Placement CriteriaIFOH Bed Placement CriteriaILH Bed Placement CriteriaIMVH Bed Placement Criteria   Question Answer Comment   Admitting Physician [place] A    Service: Medicine    Estimated Length of Stay > or = to 2 midnights    Tentative Discharge Plan? Home or Self Care    Does patient need telemetry? No           DIAGNOSIS    ICD-10-CM    1. AKI (acute kidney injury)  N17.9    2. Drug-induced hyperglycemia  R73.9     T50.905A        ADMITTED ON: 08/10/2020 at 2127 for Inpatient      PMH:  has a past medical history of Arthritis, Asthma, Chronic kidney disease, Congestive heart failure, Diabetes mellitus, Fibromyalgia, Hyperlipidemia, Hypertension, Neuropathy, Pulmonary embolism (08/2019), and Vertigo.    Reason For Hospitalization:    Pt is a 39 y.o. female who arrived to the ER with C/O Shortness of Breath, Chest Pain, and Emesis presented for elevated blood glucose, cough shortness of breath and chest tightness.  Patient states that over the past 2 days she has had decreased p.o. intake secondary to nausea and vomiting.  Also admits to headache and nonproductive cough.  Denies any fever.  She states that she recently moved from [place] and does not currently have a primary care doctor.  She does have an endocrinologist who she saw 2 days ago and was started on Lantus, Humalog, Metformin and Victoza.  She states that she has been taking her Lantus as well as other medications as scheduled and has been discussing her other medications for her other medical problems with her primary care doctor in [place] who has been taking care of her.  She has not been taking Humalog over the past 2 days as she has not been eating much.  She states  that she was previously on medication which controlled her diabetes in [place] but her insurance in [place] does not cover it.  Denies any fever.  Does admit to her son being exposed to Covid.  Patient is vaccinated for Covid      Vitals:   Patient Vitals for the past 24 hrs:   BP Temp Temp src Pulse Resp SpO2 Height Weight   08/11/20 2100 174/86 98 F (36.7 C) Oral 87 13 100 % 1.651 m (5\' 5" ) 134.6 kg (296 lb 11.8 oz)   08/11/20 2055 (!) 174/102 - - 90 (!) 11 - - -   08/10/20 2302 (!) 141/98 - - 91 20 99 % - -         Temp:  [97.7 F (36.5 C)-98.8 F (37.1 C)]   Heart Rate:  [86-98]   Resp Rate:  [11-20]   BP: (101-174)/(66-102)   SpO2:  [96 %-100 %]   Height:  [165.1 cm (5\' 5" )-167.6 cm (5\' 6" )]   Weight:  [124.3 kg (274 lb)-134.6 kg (296 lb 11.8 oz)]   BMI (calculated):  [44.3-49.5]     Pain 7/10  Last recorded pain score:  Pain Scale Used: Numeric Scale (0-10)  Pain Score: 2-mild pain (chronic fibromyalgia/arthritic pain)     EKG:     Abnormal Labs:  Lab Results last 48 Hours     Procedure Component Value Units Date/Time    Basic Metabolic Panel [[id number]]  (Abnormal) Collected: 08/11/20 2048    Specimen: Blood Updated: 08/11/20 2136     Glucose 593 mg/dL      BUN 21.3 mg/dL      Creatinine 2.3 mg/dL      Sodium 086 mEq/L      Chloride 93 mEq/L      CO2 19 mEq/L     GFR [[id number]] Collected: 08/11/20 2048     Updated: 08/11/20 2136     EGFR 23.4    Glucose Whole Blood - POCT [[id number]]  (Abnormal) Collected: 08/11/20 2056     Updated: 08/11/20 2100     Whole Blood Glucose POCT 518 mg/dL     Glucose Whole Blood - POCT [[id number]]  (Abnormal) Collected: 08/11/20 2022     Updated: 08/11/20 2035     Whole Blood Glucose POCT 509 mg/dL     Basic Metabolic Panel [[id number]]  (Abnormal) Collected: 08/11/20 1608    Specimen: Blood Updated: 08/11/20 1646     Glucose 651 mg/dL      BUN 59.5 mg/dL      Creatinine 2.3 mg/dL      Sodium 638 mEq/L      Chloride 93 mEq/L      CO2 21 mEq/L     GFR [[id number]] Collected:  08/11/20 1608     Updated: 08/11/20 1646     EGFR 23.4    Lipid panel [[id number]]  (Abnormal) Collected: 08/11/20 0848     HDL 36 mg/dL      LDL Calculated 301 mg/dL     Glucose Whole Blood - POCT [[id number]]  (Abnormal) Collected: 08/11/20 1230     Updated: 08/11/20 1238     Whole Blood Glucose POCT >600 mg/dL     Glucose Whole Blood - POCT [[id number]]  (Abnormal) Collected: 08/11/20 1102     Updated: 08/11/20 1119     Whole Blood Glucose POCT 573 mg/dL     Troponin I [[id number]] Collected: 08/11/20 0848     Updated: 08/11/20 0950     Troponin I <0.01 ng/mL     GFR [[id number]] Collected: 08/11/20 0848     Updated: 08/11/20 0937     EGFR 25.9    Basic Metabolic Panel [[id number]]  (Abnormal) Collected: 08/11/20 0848    Specimen: Blood Updated: 08/11/20 0937     Glucose 652 mg/dL      BUN 94.8 mg/dL      Creatinine 2.1 mg/dL      Sodium 546 mEq/L      Chloride 94 mEq/L      CO2 16 mEq/L      Anion Gap 17.0    Osmolality [[id number]]  (Abnormal) Collected: 08/11/20 0848    Specimen: Blood Updated: 08/11/20 0936     Osmolality 322 mosm/kg     UA Reflex to Micro - Reflex to Culture [[id number]]  (Abnormal) Collected: 08/11/20 0848     Protein, UR >=500     Glucose, UA 500     Ketones UA Trace     Blood, UA Small    Prothrombin time/INR [[id number]]  (Abnormal) Collected: 08/11/20 0848    Specimen: Blood Updated: 08/11/20 0931     PT 13.6 sec      PT INR 1.2    RSV screen [[id number]] Collected: 08/11/20 0747    Specimen: Nasopharyngeal from Nasal Aspirate Updated: 08/11/20 5277    Narrative:  ORDER#: [id number]                                    ORDERED BY: JABBAR, LUBNA  SOURCE: Nasal Aspirate                               COLLECTED:  08/11/20 07:47  ANTIBIOTICS AT COLL.:                                RECEIVED :  08/11/20 07:53  RSV, Rapid Antigen                         FINAL       08/11/20 08:33  08/11/20   Negative for RSV Antigen             Reference Range: Negative      Hemoglobin A1C [[id number]]  (Abnormal)  Collected: 08/11/20 0255    Specimen: Blood Updated: 08/11/20 0805     Hemoglobin A1C >14.0 %      Average Estimated Glucose see below mg/dL     Glucose Whole Blood - POCT [[id number]]  (Abnormal) Collected: 08/11/20 0749     Updated: 08/11/20 0751     Whole Blood Glucose POCT 550 mg/dL     Glucose Whole Blood - POCT [[id number]]  (Abnormal) Collected: 08/11/20 0644     Updated: 08/11/20 0647     Whole Blood Glucose POCT >600 mg/dL     B-type Natriuretic Peptide [[id number]] Collected: 08/11/20 0255    Specimen: Blood Updated: 08/11/20 0634     B-Natriuretic Peptide <10 pg/mL     Glucose Whole Blood - POCT [[id number]]  (Abnormal) Collected: 08/11/20 0531     Updated: 08/11/20 0534     Whole Blood Glucose POCT >600 mg/dL     GFR [[id number]] Collected: 08/11/20 0255     Updated: 08/11/20 0521     EGFR 25.9    Basic Metabolic Panel [[id number]]  (Abnormal) Collected: 08/11/20 0255    Specimen: Blood Updated: 08/11/20 0520     Glucose 598 mg/dL      BUN 75.6 mg/dL      Creatinine 2.1 mg/dL      Sodium 433 mEq/L      Chloride 93 mEq/L     Troponin I [[id number]] Collected: 08/11/20 0255    Specimen: Blood Updated: 08/11/20 0424     Troponin I <0.01 ng/mL     Troponin I [[id number]] Collected: 08/11/20 0255    Specimen: Blood Updated: 08/11/20 0423     Troponin I <0.01 ng/mL     CBC without differential [[id number]]  (Abnormal) Collected: 08/11/20 0255    Specimen: Blood Updated: 08/11/20 0356     WBC 12.46 x10 3/uL      Hgb 9.6 g/dL      Hematocrit 22.0 %      Platelets 353 x10 3/uL      RBC 3.72 x10 6/uL     Glucose Whole Blood - POCT [[id number]]  (Abnormal) Collected: 08/11/20 0128     Updated: 08/11/20 0145     Whole Blood Glucose POCT 379 mg/dL     Glucose Whole Blood - POCT [[id number]]  (Abnormal) Collected: 08/10/20 2341     Updated: 08/10/20 2343     Whole Blood Glucose  POCT 347 mg/dL     VWUJW-11 (SARS-COV-2) and Influenza (Liat Rapid) [[id number]] Collected: 08/10/20 2159    Specimen: Culturette from Nasopharyngeal  Updated: 08/10/20 2242     Purpose of COVID testing Diagnostic -PUI     SARS-CoV-2 Specimen Source Nasopharyngeal     SARS CoV 2 Overall Result Not Detected     Influenza A Not Detected     Influenza B Not Detected    Troponin I [[id number]] Collected: 08/10/20 2159    Specimen: Blood Updated: 08/10/20 2241     Troponin I <0.01 ng/mL     Comprehensive metabolic panel [[id number]]  (Abnormal) Collected: 08/10/20 2159    Specimen: Blood Updated: 08/10/20 2236     Glucose 398 mg/dL      BUN 41.3 mg/dL      Creatinine 2.2 mg/dL      Sodium 244 mEq/L      Chloride 91 mEq/L      Globulin 4.2 g/dL     GFR [[id number]] Collected: 08/10/20 2159     Updated: 08/10/20 2236     EGFR 24.6    CBC and differential [[id number]]  (Abnormal) Collected: 08/10/20 2159    Specimen: Blood Updated: 08/10/20 2216     WBC 13.09 x10 3/uL      Hgb 10.4 g/dL      Hematocrit 59.5 %      Platelets 404 x10 3/uL      Neutrophils Absolute 8.10 x10 3/uL      Lymphocytes Absolute Automated 3.84 x10 3/uL     Glucose Whole Blood - POCT [[id number]]  (Abnormal) Collected: 08/10/20 2125     Updated: 08/10/20 2126     Whole Blood Glucose POCT 347 mg/dL             Radiologic Exams:   XR Chest PA Or AP    Result Date: 08/10/2020    No acute cardiopulmonary process. Wyatt Portela, MD  08/10/2020 9:40 PM    US Renal Kidney    Result Date: 08/11/2020    Unremarkable appearance of the kidneys and bladder. Wyatt Portela, MD  08/11/2020 8:07 PM        ED meds:   Medication Administration from 08/10/2020 2111 to 08/11/2020 0113   Date/Time Order Dose Route Action Action by Comments    08/11/2020 0000 sodium chloride 0.9 % bolus 500 mL 0 mL Intravenous Stopped Almon Register, RN     08/10/2020 2300 sodium chloride 0.9 % bolus 500 mL 500 mL Intravenous [address] [place], [place]     08/10/2020 2232 albuterol sulfate HFA (PROVENTIL) inhaler 4 puff 4 puff Inhalation Given Dorna Mai, RT     08/10/2020 2332 ondansetron (ZOFRAN) injection 4 mg 4 mg  Intravenous Given Almon Register, RN     08/10/2020 2332 dexAMETHasone (PF) (DECADRON) 10 mg/mL injection 10 mg 10 mg Intravenous Given Almon Register, RN        Treatment Plan:   H&P 08/11/2020  ASSESSMENT & PLAN  [name] is a 39 y.o. female with medical history of asthma, D- CHF, PE on Eliquis, blood loss anemia secondary to vaginal bleed due to heparin drip started for PE resulted in 6 units PRBCs, uterine angiography/embolization,  hypertension hyperlipidemia, diabetes, morbid obesity and GERD admitted with multiple complaints.  Patient Active Hospital Problem List: 08/11/20  # Hyperglycemia due to DM: Blood glucose 398.  Per patient, switching of her diabetic medication, blood glucose is not under control.  However will rule out infectious etiology as patient has leukocytosis of 13.09  # Hx of DM;  Per patient, she moved from [place], was on oral hypoglycemic (unknown name) plus insulin in [place] with good glycemic control, changed to Victoza, Metformin and insulin due to insurance noncoverage once moved, since blood glucose level is not controlled.  She visited endocrinologist 2 days ago added Humalog, kept same dose of Lantus, increase dose up with Victoza and decrease Metformin dose.  # Hyponatremia; Corrected Na 133  --Reviewed chest x-ray: Negative for acute abnormality.  -Will send UA  -Will check A1c.  Starting glargine 20 units twice daily along with correctional scale insulin with hypoglycemia protocol.  -Will check urine/plasma osmolarity urine sodium.  -Strict INO's.  # Acute on CKD; Cr 2.2 POA. I also reviewed nephrology office visit note dated on 02/15/2020, creatinine was 1.9 in April, 2021  # Decrease urinary output  -Placing renal/bladder US order.  -Strict I&Os.  -Per patient, takes Bumex and Olmesartan.  Will continue Bumex, holding olmesartan.  # Dyspnea  # Asthma exacerbation:   -Patient received a dose of Decadron and albuterol in ED, will continue albuterol  scheduled/as needed, starting Solu-Medrol 40 mg 3 times daily.  -Antitussives as needed.  -Covid and influenza are negative.  Will send procalcitonin and RSV.  -Holding antibiotics.  -Pulse oximetry and supplemental oxygen via nasal cannula as needed.  -Will check BNP  # Hx of D- CHF  # Hx of HTN  # Hx of HLD  # Hx of PE on Eliquis; has hx of blood loss anemia secondary to vaginal bleed due to heparin drip started for PE resulted in 6 units PRBCs, uterine angiography/embolization  -Reviewed Echo report of (10/20) shows EF=50-55%, mild dilated LV, mild cLVH, borderline dilated LA, mild dilated RA, trivial TR, trivial AR, grade I DD.  -Will check BNP  -Daily standing weight and strict I&Os  -Will continue aspirin, Lipitor, hydralazine, Coreg and Bumex, while holding olmesartan.  -Will continue home dose of Eliquis  # GERD  Will continue home dose of PPI  # Morbid obesity: BMI >42.0  Lifestyle modification with diet and exercise. Nutrition consult placed  Nutrition  Cardiac and consistent carb diet..  DVT/VTE Prophylaxis  SCD/Eliquis.   Anticipated medical stability for discharge: 1-2 days.  Service status/Reason for ongoing hospitalization: Observation/multiple complaints.  Anticipated Discharge Needs: To be determined.    MD 08/11/2020  ASSESSMENT/PLAN  Mykael Trott is a 39 y.o. female admitted with AKI (acute kidney injury)  Interval Summary:   Active Hospital Problems  Diagnosis  . AKI (acute kidney injury)  . Drug-induced hyperglycemia  Steroid induced hyperglycemia, severe and persistent.  Acute asthma exacerbation, improved  AGMA- concerning for ketoacidosis  Pseudohyponatremia, corrected sodium 136  CKD stage III with azotemia  Stop steroids  Aggressive insulin regimen to keep glucose controlled  May need to transfer to ICU for insulin drip  Check BMP later in the afternoon  Update:  Hyperglycemia continues to be severe, however, bicarb improved to 21, GAP is closed.   -  Increase insulin to q4h with high  intensity SSI  -  Increase lantus to 40 units BID  -  Patient and daughter concerned, wanting ICU transfer  -  Discuss with ICU for consult  -  1/2 NS 125 cc/hr- patient concerned with volume overload- decreasing to 75 cc/hr.  Update:  Transferring to the ICU for insulin drip.        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 S/p hysterectomy.   Leukocytosis without fever, likely reactive  Asthma  Hyperlipidemia  Plan:  Admit to ICU  Start on insulin infusion per protocol  IV hydration  Monitor renal function. Strict I&O. Replace electrolytes as needed  Renal US pending  Continue eliquis  Monitor for signs of fluid overload  Continue Coreg  Hydralazine for HTN  Monitor H&H. No signs of bleeding.  Continue timed duo-nebs. Discontinue IV steroids.  Atorvastatin 20 mh nightly      Orders  VS, labs, ECG, glucose poct, BR, tele, SCD's, CPAP, nebs tx,    Scheduled Meds:  Current Facility-Administered Medications   Medication Dose Route Frequency   . albuterol-ipratropium  3 mL Nebulization Q4H SCH   . apixaban  5 mg Oral Q12H   . atorvastatin  20 mg Oral QHS   . carvedilol  25 mg Oral Q12H SCH   . gabapentin  600 mg Oral Q8H SCH   . hydrALAZINE  50 mg Oral BID   . pantoprazole  40 mg Oral QAM AC   . senna-docusate  1 tablet Oral Q12H SCH     Continuous Infusions:  . sodium chloride 125 mL/hr at 08/11/20 2122   . insulin regular 17.402 Units/hr (08/11/20 2119)     PRN Meds:.acetaminophen **OR** acetaminophen, albuterol-ipratropium, Nursing communication: Adult Hypoglycemia Treatment Algorithm **AND**  dextrose **AND** dextrose **AND** glucagon (rDNA), dextrose, dextrose, melatonin, naloxone, nitroglycerin, ondansetron **OR** ondansetron        This clinical review is based on compiled documentation provided by the treatment team within the patient's medical record.  ________________________________________      UTILIZATION REVIEW CONTACT : Eddie North, MSN, RN, ACM  Utilization Review Case Manager II / Case Management  [facility]  [address]  [place], [place] [address]  T [phone] Judie Petit [phone]  F [phone]   Email: [email or web address]  NPI: [id number] Tax ID:  [id number]

## 2020-08-11 NOTE — ED Notes (Signed)
[facility] EMERGENCY DEPARTMENT  ED NURSING NOTE FOR THE RECEIVING INPATIENT NURSE   ED NURSE Denyce Robert   [facility] [address]   ED CHARGE RN [phone]   ADMISSION INFORMATION   Tyleah Loh is a 39 y.o. female admitted with a diagnosis of:    1. AKI (acute kidney injury)         Isolation: None   Allergies: Bactrim [sulfamethoxazole-trimethoprim]   Holding Orders confirmed? Yes   Belongings Documented? Yes   Home medications sent to pharmacy confirmed? No   NURSING CARE   Patient Comes From:   Mental Status: Home Independent  alert and oriented   ADL: Independent with all ADLs   Ambulation: no difficulty   Pertinent Information  and Safety Concerns: na     COVID Test sent to lab? Yes   VITAL SIGNS   Time BP Temp Pulse Resp SpO2   2302 141/98 99.1 91 20 99   CT / NIH   CT Head ordered on this patient?  No   NIH/Dysphagia assessment done prior to admission? No   PERSONAL PROTECTIVE EQUIPMENT   Gloves   LAB RESULTS   Labs Reviewed   CBC AND DIFFERENTIAL - Abnormal; Notable for the following components:       Result Value    WBC 13.09 (*)     Hgb 10.4 (*)     Hematocrit 32.9 (*)     Platelets 404 (*)     Neutrophils Absolute 8.10 (*)     Lymphocytes Absolute Automated 3.84 (*)     All other components within normal limits   COMPREHENSIVE METABOLIC PANEL - Abnormal; Notable for the following components:    Glucose 398 (*)     BUN 82.0 (*)     Creatinine 2.2 (*)     Sodium 128 (*)     Chloride 91 (*)     Globulin 4.2 (*)     All other components within normal limits   GLUCOSE WHOLE BLOOD - POCT - Abnormal; Notable for the following components:    Whole Blood Glucose POCT 347 (*)     All other components within normal limits   GLUCOSE WHOLE BLOOD - POCT - Abnormal; Notable for the following components:    Whole Blood Glucose POCT 347 (*)     All other components within normal limits   COVID-19 (SARS-COV-2) & INFLUENZA  A/B, NAA (ROCHE LIAT)    Narrative:     o Collect and clearly label specimen type:  o PREFERRED-Upper  respiratory specimen: One Nasopharyngeal  Swab in Transport Media.  o Hand deliver to laboratory ASAP  Diagnostic -PUI   CULTURE BLOOD AEROBIC AND ANAEROBIC    Narrative:     1 BLUE+1 PURPLE   CULTURE BLOOD AEROBIC AND ANAEROBIC    Narrative:     1 BLUE+1 PURPLE   LACTIC ACID, PLASMA   TROPONIN I   HEMOLYSIS INDEX   GFR   BLOOD GAS, VENOUS   B-TYPE NATRIURETIC PEPTIDE   IHS D-DIMER   URINALYSIS REFLEX TO MICROSCOPIC EXAM - REFLEX TO CULTURE          Ticket to Ride Printed: Yes

## 2020-08-11 NOTE — Progress Notes (Addendum)
[facility]  PROGRESS NOTE      Patient: [name]  Date: 08/11/2020   LOS: 0 Days  Admission Date: 08/10/2020   MRN: [id number]  Attending: Anola Gurney  Please contact me on the following Spectralink [phone]       ASSESSMENT/PLAN     Astoria Condon is a 39 y.o. female admitted with AKI (acute kidney injury)    Interval Summary:     Active Hospital Problems    Diagnosis   . AKI (acute kidney injury)   . Drug-induced hyperglycemia       Steroid induced hyperglycemia, severe and persistent.  Acute asthma exacerbation, improved  AGMA- concerning for ketoacidosis  Pseudohyponatremia, corrected sodium 136  CKD stage III with azotemia      Stop steroids  Aggressive insulin regimen to keep glucose controlled  May need to transfer to ICU for insulin drip  Check BMP later in the afternoon    Update:  Hyperglycemia continues to be severe, however, bicarb improved to 21, GAP is closed.   -  Increase insulin to q4h with high intensity SSI  -  Increase lantus to 40 units BID  -  Patient and daughter concerned, wanting ICU transfer  -  Discuss with ICU for consult  -  1/2 NS 125 cc/hr- patient concerned with volume overload- decreasing to 75 cc/hr.    Total critical care time:  55 minutes.    Update:  Transferring to the ICU for insulin drip.                  SUBJECTIVE     [name] states feeling better, having multiple bowel movements; breathing much improved.  History of poorly controlled diabetes.  Working with her endocrinologist for adjustment of medications.      MEDICATIONS     Current Facility-Administered Medications   Medication Dose Route Frequency   . albuterol-ipratropium  3 mL Nebulization Q6H SCH   . apixaban  5 mg Oral Q12H   . atorvastatin  20 mg Oral QHS   . bumetanide  2 mg Oral BID   . carvedilol  25 mg Oral Q12H SCH   . gabapentin  300 mg Oral TID   . hydrALAZINE  50 mg Oral BID   . insulin glargine  20 Units Subcutaneous Q12H   . insulin lispro  1-3 Units Subcutaneous QHS   . insulin lispro   1-5 Units Subcutaneous TID AC   . insulin lispro  10 Units Subcutaneous TID AC   . insulin lispro  20 Units Subcutaneous Once   . pantoprazole  40 mg Oral QAM AC       PHYSICAL EXAM     Vitals:    08/11/20 1106   BP: 101/66   Pulse: 86   Resp:    Temp:    SpO2: 96%       Temperature: Temp  Min: 98.6 F (37 C)  Max: 99.1 F (37.3 C)  Pulse: Pulse  Min: 86  Max: 98  Respiratory: Resp  Min: 18  Max: 20  Non-Invasive BP: BP  Min: 101/66  Max: 150/85  Pulse Oximetry SpO2  Min: 96 %  Max: 99 %    Intake and Output Summary (Last 24 hours) at Date Time    Intake/Output Summary (Last 24 hours) at 08/11/2020 1237  Last data filed at 08/11/2020 1000  Gross per 24 hour   Intake 860 ml   Output 600 ml   Net 260 ml  GEN APPEARANCE: Normal;  A&OX3  HEENT: PERLA; EOMI; Conjunctiva Clear  NECK: Supple; No bruits  CVS: RRR, S1, S2; No M/G/R  LUNGS: CTAB; No Wheezes; No Rhonchi: No rales  ABD: Soft; No TTP; + Normoactive BS  EXT: No edema; Pulses 2+ and intact  Skin exam:  pink  NEURO: CN 2-12 intact; No Focal neurological deficits  CAP REFILL:  Normal  MENTAL STATUS:  Normal    Exam done by Anola Gurney, MD on 08/11/20 at 12:37 PM      LABS     Recent Labs   Lab 08/11/20  0255 08/10/20  2159   WBC 12.46* 13.09*   RBC 3.72* 4.02   Hgb 9.6* 10.4*   Hematocrit 30.5* 32.9*   MCV 82.0 81.8   Platelets 353* 404*       Recent Labs   Lab 08/11/20  0848 08/11/20  0255 08/10/20  2159   Sodium 127* 127* 128*   Potassium 5.1 5.1 4.3   Chloride 94* 93* 91*   CO2 16* 22 24   BUN 75.0* 80.0* 82.0*   Creatinine 2.1* 2.1* 2.2*   Glucose 652* 598* 398*   Calcium 9.1 9.4 9.6       Recent Labs   Lab 08/10/20  2159   ALT 17   AST (SGOT) 11   Bilirubin, Total 0.3   Albumin 3.6   Alkaline Phosphatase 104       Recent Labs   Lab 08/11/20  0848 08/11/20  0255 08/10/20  2159   Troponin I <0.01 <0.01  <0.01 <0.01       Recent Labs   Lab 08/11/20  0848   PT INR 1.2*   PT 13.6*       Microbiology Results (last 15 days)     Procedure Component Value  Units Date/Time    RSV screen [[id number]] Collected: 08/11/20 0747    Order Status: Completed Specimen: Nasopharyngeal from Nasal Aspirate Updated: 08/11/20 0833    Narrative:      ORDER#: [id number]                                    ORDERED BY: Greig Castilla, LUBNA  SOURCE: Nasal Aspirate                               COLLECTED:  08/11/20 07:47  ANTIBIOTICS AT COLL.:                                RECEIVED :  08/11/20 07:53  RSV, Rapid Antigen                         FINAL       08/11/20 08:33  08/11/20   Negative for RSV Antigen             Reference Range: Negative      COVID-19 (SARS-COV-2) and Influenza Raina Mina Rapid) [[id number]] Collected: 08/10/20 2159    Order Status: Completed Specimen: Culturette from Nasopharyngeal Updated: 08/10/20 2242     Purpose of COVID testing Diagnostic -PUI     SARS-CoV-2 Specimen Source Nasopharyngeal     SARS CoV 2 Overall Result Not Detected     Comment: Test performed using the Roche cobas Liat SARS-CoV-2 assay. This assay  is  only for use under the Food and Drug Administration's Emergency Use  Authorization. This is a real-time RT-PCR assay for the qualitative  detection of SARS-CoV-2 RNA. Viral nucleic acids may persist in vivo,  independent of viability. Detection of viral nucleic acid does not imply the  presence of infectious virus, or that virus nucleic acid is the cause of  clinical symptoms. Negative results do not preclude SARS-CoV-2 infection and  should not be used as the sole basis for diagnosis, treatment or other  patient management decisions. Negative results must be combined with  clinical observations, patient history, and/or epidemiological information.  Invalid results may be due to inhibiting substances in the specimen and  recollection should occur. Please see Fact Sheets for patients and providers  located:  WirelessDSLBlog.no.          Influenza A Not Detected     Influenza B Not Detected     Comment: Test performed using the Roche  cobas Liat SARS-CoV-2 & Influenza A/B assay.  This assay is only for use under the Food and Drug Administration's  Emergency Use Authorization. This is a multiplex real-time RT-PCR assay  intended for the simultaneous in vitro qualitative detection and  differentiation of SARS-CoV-2, influenza A, and influenza B virus RNA. Viral  nucleic acids may persist in vivo, independent of viability. Detection of  viral nucleic acid does not imply the presence of infectious virus, or that  virus nucleic acid is the cause of clinical symptoms. Negative results do  not preclude SARS-CoV-2, influenza A, and/or influenza B infection and  should not be used as the sole basis for diagnosis, treatment or other  patient management decisions. Negative results must be combined with  clinical observations, patient history, and/or epidemiological information.  Invalid results may be due to inhibiting substances in the specimen and  recollection should occur. Please see Fact Sheets for patients and providers  located: http://www.rice.biz/.         Narrative:      o Collect and clearly label specimen type:  o PREFERRED-Upper respiratory specimen: One Nasopharyngeal  Swab in Transport Media.  o Hand deliver to laboratory ASAP  Diagnostic -PUI    Blood Culture Aerobic/Anaerobic #1 [[id number]] Collected: 08/10/20 2159    Order Status: Sent Specimen: Arm from Blood, Venipuncture Updated: 08/11/20 0148    Narrative:      1 BLUE+1 PURPLE    Blood Culture Aerobic/Anaerobic #2 [[id number]] Collected: 08/10/20 2159    Order Status: Sent Specimen: Arm from Blood, Venipuncture Updated: 08/11/20 0148    Narrative:      1 BLUE+1 PURPLE    COVID-19 (SARS-COV-2) and Influenza (Liat Rapid) [[id number]]     Order Status: Sent Specimen: Culturette from Nasopharyngeal            XR Chest PA Or AP    Result Date: 08/10/2020    No acute cardiopulmonary process. Wyatt Portela, MD  08/10/2020 9:40 PM        Echo Results     None          RADIOLOGY      Upon my review:    Signed,  Anola Gurney  12:37 PM 08/11/2020

## 2020-08-11 NOTE — H&P (Signed)
[facility]      Patient: [name]  Date: 08/10/2020   DOB: 10-30-1981  Admission Date: 08/10/2020   MRN: [id number]  Attending: Marya Amsler         Cc:  1. Elevated blood glucose reading at home  2. Asthma symptoms  3. Decrease urine output     History Gathered From: Patient, EMS/ED.  I also reviewed [facility] note dated on 08/09/2020, also reviewed nephrology office visit note dated on 02/15/2020.    HISTORY AND PHYSICAL     [name] is a 39 y.o. female with medical history of asthma, D- CHF, PE on Eliquis, blood loss anemia secondary to vaginal bleed due to heparin drip started for PE resulted in 6 units PRBCs, uterine angiography/embolization,  hypertension hyperlipidemia, diabetes, morbid obesity and GERD who presented with multiple complaints.    Patient states that she moved from [place], was on oral hypoglycemic (unknown name) plus insulin in [place] with good glycemic control, changed to Victoza, Metformin and insulin due to insurance noncoverage once moved, since blood glucose level is not controlled.  She visited endocrinologist 2 days ago added Humalog, kept same dose of Lantus, increase dose up with Victoza and decrease Metformin dose.  Patient checked her Accu-Chek and found high undetectable.    Patient is also reporting of headache, dizziness, shortness of breath, wheezing, dry cough and chest tightness.  She has history of asthma, tried home inhalers and nebs treatment without response. Patient has completed 2nd Moderna COVID vaccination dose in March, 2021.    Patient is also complaining of nausea, vomiting and chills.    Patient is also complaining of decreased urinary output.  I also reviewed nephrology office visit note dated on 02/15/2020, creatinine was 1.9 in April, 2021    PMHx/PSHx: As in HPI    Prior to Admission medications    Medication Sig Start Date End Date Taking? Authorizing Provider   apixaban (ELIQUIS) 5 MG Take 5 mg by mouth every 12  (twelve) hours   Yes [provider]   atorvastatin (LIPITOR) 20 MG tablet Take 20 mg by mouth nightly   Yes [provider]   baclofen (LIORESAL) 10 MG tablet Take 10 mg by mouth as needed   Yes [provider]   bumetanide (BUMEX) 2 MG tablet Take 2 mg by mouth 2 (two) times daily   Yes [provider]   carvedilol (COREG) 25 MG tablet Take 25 mg by mouth 2 (two) times daily   Yes [provider]   gabapentin (NEURONTIN) 600 MG tablet Take 600 mg by mouth 3 (three) times daily   Yes [provider]   hydrALAZINE (APRESOLINE) 50 MG tablet Take 50 mg by mouth 2 (two) times daily   Yes [provider]   hydrOXYzine (ATARAX) 10 MG tablet Take 10 mg by mouth every 6 (six) hours as needed for Anxiety   Yes [provider]   omeprazole (PriLOSEC) 40 MG capsule Take 40 mg by mouth nightly   Yes [provider]       Allergies   Allergen Reactions   . Bactrim [Sulfamethoxazole-Trimethoprim] Hives, Respiratory Distress and Edema       CODE STATUS: Full code.    PRIMARY CARE MD: Pcp, None, MD    Family history:  Mother: CAD, hypertension, diabetes.  Father: Asthma    Social history:  Patient denies smoking cigarettes, drinking alcohol or using illicit drugs.    REVIEW OF SYSTEMS  Positive for: As in HPI.  Negative for: As in HPI.  All ROS completed and otherwise negative.    PHYSICAL EXAM     Vital Signs (most recent): BP 150/85   Pulse 91   Temp 98.6 F (37 C) (Oral)   Resp 20   Ht 1.676 m (5\' 6" )   Wt 124.3 kg (274 lb)   SpO2 99%   BMI 44.22 kg/m   Constitutional: NAD. Patient speaks freely in full sentences.   HEENT: NC/AT, no scleral icterus or conjunctival pallor, no nasal discharge, MMM.  Neck: Trachea midline, supple, no cervical or supraclavicular lymphadenopathy or masses.  Cardiovascular: RRR, normal S1 S2, no murmurs, gallops, palpable thrills. + lgs swelling  Respiratory: Normal rate.  Mild expiratory wheezing  bilaterally.  Gastrointestinal: +BS, non-distended, soft, non-tender, no rebound or guarding.  Genitourinary: No suprapubic tenderness.  Musculoskeletal: ROM and motor strength grossly normal.   Skin exam: Normal.  Neurologic: No gross motor or sensory deficits  Psychiatric: AAOx3, affect and mood appropriate. The patient is alert, interactive, appropriate.  Capillary refill: Normal.    Exam done by Marya Amsler, MD on 08/11/20 at 3:43 AM      LABS & IMAGING     Recent Results (from the past 24 hour(s))   Glucose Whole Blood - POCT    Collection Time: 08/10/20  9:25 PM   Result Value Ref Range    Whole Blood Glucose POCT 347 (H) 70 - 100 mg/dL   B-type Natriuretic Peptide    Collection Time: 08/10/20  9:55 PM   Result Value Ref Range    B-Natriuretic Peptide <10 0 - 100 pg/mL   CBC and differential    Collection Time: 08/10/20  9:59 PM   Result Value Ref Range    WBC 13.09 (H) 3.1 - 9.5 x10 3/uL    Hgb 10.4 (L) 11.4 - 14.8 g/dL    Hematocrit 16.1 (L) 34.7 - 43.7 %    Platelets 404 (H) 142 - 346 x10 3/uL    RBC 4.02 3.90 - 5.10 x10 6/uL    MCV 81.8 78.0 - 96.0 fL    MCH 25.9 25.1 - 33.5 pg    MCHC 31.6 31 - 35 g/dL    RDW 12 09.6 - 04.5 %    MPV 10.8 8.9 - 12.5 fL    Neutrophils 61.8 None %    Lymphocytes Automated 29.3 None %    Monocytes 5.7 None %    Eosinophils Automated 2.2 None %    Basophils Automated 0.5 None %    Immature Granulocytes 0.5 None %    Nucleated RBC 0.0 0.0 - 0.0 /100 WBC    Neutrophils Absolute 8.10 (H) 1 - 6 x10 3/uL    Lymphocytes Absolute Automated 3.84 (H) 0.42 - 3.22 x10 3/uL    Monocytes Absolute Automated 0.74 0.21 - 0.85 x10 3/uL    Eosinophils Absolute Automated 0.29 0.00 - 0.44 x10 3/uL    Basophils Absolute Automated 0.06 0.00 - 0.08 x10 3/uL    Immature Granulocytes Absolute 0.06 0.00 - 0.07 x10 3/uL    Absolute NRBC 0.00 0.00 - 0.00 x10 3/uL   Comprehensive metabolic panel    Collection Time: 08/10/20  9:59 PM   Result Value Ref Range    Glucose 398 (H) 70 - 100 mg/dL    BUN 40.9  (H) 7 - 19 mg/dL    Creatinine 2.2 (H) 0.6 - 1.0 mg/dL    Sodium 811 (L) 914 -  145 mEq/L    Potassium 4.3 3.5 - 5.1 mEq/L    Chloride 91 (L) 100 - 111 mEq/L    CO2 24 22 - 29 mEq/L    Calcium 9.6 8.5 - 10.5 mg/dL    Protein, Total 7.8 6.0 - 8.3 g/dL    Albumin 3.6 3.5 - 5.0 g/dL    AST (SGOT) 11 5 - 34 U/L    ALT 17 0 - 55 U/L    Alkaline Phosphatase 104 37 - 106 U/L    Bilirubin, Total 0.3 0.2 - 1.2 mg/dL    Globulin 4.2 (H) 2.0 - 3.6 g/dL    Albumin/Globulin Ratio 0.9 0.9 - 2.2    Anion Gap 13.0 5 - 15   Lactic Acid    Collection Time: 08/10/20  9:59 PM   Result Value Ref Range    Lactic Acid 1.2 0.2 - 2.0 mmol/L   Troponin I    Collection Time: 08/10/20  9:59 PM   Result Value Ref Range    Troponin I <0.01 0.00 - 0.05 ng/mL   COVID-19 (SARS-COV-2) and Influenza (Liat Rapid)    Collection Time: 08/10/20  9:59 PM    Specimen: Nasopharyngeal; Culturette   Result Value Ref Range    Purpose of COVID testing Diagnostic -PUI     SARS-CoV-2 Specimen Source Nasopharyngeal     SARS CoV 2 Overall Result Not Detected     Influenza A Not Detected     Influenza B Not Detected    Hemolysis index    Collection Time: 08/10/20  9:59 PM   Result Value Ref Range    Hemolysis Index 0 0 - 18   GFR    Collection Time: 08/10/20  9:59 PM   Result Value Ref Range    EGFR 24.6    Venous Blood Gas    Collection Time: 08/10/20  9:59 PM   Result Value Ref Range    pH, Ven 7.354 None Estab.    pCO2, Venous 48.5 None Estab. mmHg    pO2, Venous <42.7 None Estab. mmHg    HCO3, Ven 26.3 None Estab. mEq/L    Venous Total CO2 55.3 None Estab. mEq/L    Base Excess, Ven 0.9 None Estab. mEq/L    O2 Sat, Venous 65.6 None Estab. %    Temperature 37.0     VBG CollectionSite Other    Glucose Whole Blood - POCT    Collection Time: 08/10/20 11:41 PM   Result Value Ref Range    Whole Blood Glucose POCT 347 (H) 70 - 100 mg/dL   D-Dimer    Collection Time: 08/10/20 11:45 PM   Result Value Ref Range    D-Dimer 0.23 0.00 - 0.60 ug/mL FEU   Glucose Whole Blood -  POCT    Collection Time: 08/11/20  1:28 AM   Result Value Ref Range    Whole Blood Glucose POCT 379 (H) 70 - 100 mg/dL       MICROBIOLOGY:  Blood Culture: Pending  Urine Culture: N/A  Antibiotics Started: No    IMAGING:  Upon my review: As below    CARDIAC:  EKG Interpretation (upon my review): As below    Markers:  Recent Labs   Lab 08/10/20  2159   Troponin I <0.01       EMERGENCY DEPARTMENT COURSE:  Orders Placed This Encounter   Procedures   . COVID-19 (SARS-COV-2) and Influenza (Liat Rapid)   . Blood Culture Aerobic/Anaerobic #1   . Blood  Culture Aerobic/Anaerobic #2   . XR Chest PA Or AP   . CBC and differential   . Comprehensive metabolic panel   . Lactic Acid   . Troponin I   . UA Reflex to Micro - Reflex to Culture   . Hemolysis index   . GFR   . Venous Blood Gas   . B-type Natriuretic Peptide   . D-Dimer   . Basic Metabolic Panel   . CBC without differential   . Troponin I   . Troponin I   . Hemoglobin A1C   . Hemolysis index   . GFR   . Diet NPO effective now   . Glucose POC   . Cardiac monitoring (ED Only)   . Eye protection shoud be worn for all instances of  patient contact   . Lab draws/medication administration should be bundled to minimize exposure risks   . Diet PO Challenge   . Glucose POC   . ED Holding Orders Expire in 12 Hours   . Notify Admitting Attending ( Change in Condition)   . Notify Attending of Patient Arrival to Floor within 12 Hours   . Notify Physician (Vital Signs)   . Notify Physician (Lab Results)   . Vital Signs Q4HR   . Vital signs   . Pulse Oximetry   . Progressive Mobility Protocol   . Notify physician   . NSG Communication: Glucose POCT order (PRN hypoglycemia)   . I/O   . Height   . Weight   . Skin assessment   . Place sequential compression device   . Maintain sequential compression device   . Education: Activity   . Education: Disease Process & Condition   . Education: Pain Management   . Education: Falls Risk   . Education: Smoking Cessation   . Telemetry 24 Hour  Protocol   . Notify Physician (Critical Blood Glucose Value)   . Notify physician (Communication: Document Abnormal Blood Glucose)   . Target Glucose Goals   . Nursing communication   . NSG Communication: Glucose POCT order (PRN hypoglycemia)   . If NPO, give half NPH dose   . Nursing communication: Adult Hypoglycemia Treatment Algorithm   . NSG Communication: Glucose POCT order   . NSG Communication: Glucose POCT order   . NSG Communication: Glucose POCT order   . Full Code   . ED Unit Sec Comm Order   . Nebulizer treatment intermittent   . Glucose Whole Blood - POCT   . Glucose Whole Blood - POCT   . Glucose Whole Blood - POCT   . ECG 12 lead   . ECG 12 lead  (Electrocardiogram)   . ECG 12 lead (PRN if Patient is having chest pain)   . Saline lock IV   . Saline lock IV   . Admit to Observation   . Supervise For Meals Frequency: All meals       ASSESSMENT & PLAN     Afsana Liera is a 39 y.o. female with medical history of asthma, D- CHF, PE on Eliquis, blood loss anemia secondary to vaginal bleed due to heparin drip started for PE resulted in 6 units PRBCs, uterine angiography/embolization,  hypertension hyperlipidemia, diabetes, morbid obesity and GERD admitted with multiple complaints.    Patient Active Hospital Problem List: 08/11/20    # Hyperglycemia due to DM: Blood glucose 398.  Per patient, switching of her diabetic medication, blood glucose is not under control.  However will rule out infectious etiology  as patient has leukocytosis of 13.09  # Hx of DM;  Per patient, she moved from [place], was on oral hypoglycemic (unknown name) plus insulin in [place] with good glycemic control, changed to Victoza, Metformin and insulin due to insurance noncoverage once moved, since blood glucose level is not controlled.  She visited endocrinologist 2 days ago added Humalog, kept same dose of Lantus, increase dose up with Victoza and decrease Metformin dose.  # Hyponatremia; Corrected Na 133    --Reviewed chest x-ray:  Negative for acute abnormality.  -Will send UA  -Will check A1c.  Starting glargine 20 units twice daily along with correctional scale insulin with hypoglycemia protocol.  -Will check urine/plasma osmolarity urine sodium.  -Strict INO's.    # Acute on CKD; Cr 2.2 POA. I also reviewed nephrology office visit note dated on 02/15/2020, creatinine was 1.9 in April, 2021  # Decrease urinary output    -Placing renal/bladder US order.  -Strict I&Os.  -Per patient, takes Bumex and Olmesartan.  Will continue Bumex, holding olmesartan.    # Dyspnea  # Asthma exacerbation:     -Patient received a dose of Decadron and albuterol in ED, will continue albuterol scheduled/as needed, starting Solu-Medrol 40 mg 3 times daily.  -Antitussives as needed.  -Covid and influenza are negative.  Will send procalcitonin and RSV.  -Holding antibiotics.  -Pulse oximetry and supplemental oxygen via nasal cannula as needed.  -Will check BNP    # Hx of D- CHF  # Hx of HTN  # Hx of HLD  # Hx of PE on Eliquis; has hx of blood loss anemia secondary to vaginal bleed due to heparin drip started for PE resulted in 6 units PRBCs, uterine angiography/embolization    -Reviewed Echo report of (10/20) shows EF=50-55%, mild dilated LV, mild cLVH, borderline dilated LA, mild dilated RA, trivial TR, trivial AR, grade I DD.  -Will check BNP  -Daily standing weight and strict I&Os  -Will continue aspirin, Lipitor, hydralazine, Coreg and Bumex, while holding olmesartan.  -Will continue home dose of Eliquis    # GERD  Will continue home dose of PPI    # Morbid obesity: BMI >42.0    Lifestyle modification with diet and exercise. Nutrition consult placed    Nutrition  Cardiac and consistent carb diet..    DVT/VTE Prophylaxis  SCD/Eliquis.     Anticipated medical stability for discharge: 1-2 days.    Service status/Reason for ongoing hospitalization: Observation/multiple complaints.  Anticipated Discharge Needs: To be determined.    Signed,  Marya Amsler    Total  time: 65 min, spent in patient's interview, patient examination, review and discussion of imaging, counseling, coordination of patient care, review and completion of medical records.    This note was generated by the Epic EMR system/ Dragon speech recognition and may contain inherent errors or omissions not intended by the user.

## 2020-08-11 NOTE — Plan of Care (Signed)
ST on the monitor low 100. On room air. Blood sugar on the high side. Insulin given as ordered. Plan of care discussed with patient during rounds. Repeat BMP done. Blood sugar 651 will notify Dr Mariah Milling. Kidney U/S done result pending. Will continue to monitor blood works, blood sugar and vital signs.      Problem: Safety  Goal: Patient will be free from injury during hospitalization  Outcome: Progressing  Flowsheets (Taken 08/11/2020 1641)  Patient will be free from injury during hospitalization:   Assess patient's risk for falls and implement fall prevention plan of care per policy   Provide and maintain safe environment   Hourly rounding     Problem: Pain  Goal: Pain at adequate level as identified by patient  Outcome: Progressing  Flowsheets (Taken 08/11/2020 1641)  Pain at adequate level as identified by patient:   Identify patient comfort function goal   Assess for risk of opioid induced respiratory depression, including snoring/sleep apnea. Alert healthcare team of risk factors identified.   Assess pain on admission, during daily assessment and/or before any "as needed" intervention(s)   Offer non-pharmacological pain management interventions     Problem: Renal Instability  Goal: Fluid and electrolyte balance are achieved/maintained  Outcome: Progressing  Flowsheets (Taken 08/11/2020 1641)  Fluid and electrolyte balance are achieved/maintained:   Monitor intake and output every shift   Monitor/assess lab values and report abnormal values   Provide adequate hydration   Monitor daily weight   Observe for cardiac arrhythmias     Problem: Diabetes: Glucose Imbalance  Goal: Blood glucose stable at established goal  Outcome: Progressing  Flowsheets (Taken 08/11/2020 1641)  Blood glucose stable at established goal:   Monitor lab values   Monitor/assess vital signs   Assess for hypoglycemia /hyperglycemia     Problem: Hemodynamic Status: Cardiac  Goal: Stable vital signs and fluid balance  Outcome:  Progressing  Flowsheets (Taken 08/11/2020 1641)  Stable vital signs and fluid balance:   Monitor/assess vital signs and telemetry per unit protocol   Weigh on admission and record weight daily   Assess signs and symptoms associated with cardiac rhythm changes   Monitor intake/output per unit protocol and/or LIP order   Monitor lab values

## 2020-08-11 NOTE — Progress Notes (Deleted)
[facility]  PROGRESS NOTE      Patient: [name]  Date: 08/11/2020   LOS: 0 Days  Admission Date: 08/10/2020   MRN: [id number]  Attending: Anola Gurney  Please contact me on the following Spectralink [phone]       ASSESSMENT/PLAN     [name] is a 39 y.o. female admitted with AKI (acute kidney injury)    Interval Summary:     Active Hospital Problems    Diagnosis   . AKI (acute kidney injury)   . Drug-induced hyperglycemia       Steroid induced hyperglycemia, severe and persistent.  Acute asthma exacerbation, improved  AGMA- concerning for ketoacidosis  Pseudohyponatremia, corrected sodium 136  CKD stage III with azotemia      Stop steroids  Aggressive insulin regimen to keep glucose controlled  May need to transfer to ICU for insulin drip  Check BMP later in the afternoon    Update:  Hyperglycemia continues to be severe, however, bicarb improved to 21, GAP is closed.   -  No indication for ICU admission  -  Increase insulin to q4h with high intensity SSI  -  Increase lantus to 40 units BID  -  Patient and daughter concerned, wanting ICU transfer  -  Discuss with ICU for consult  -  1/2 NS 125 cc/hr- patient concerned with volume overload- decreasing to 75 cc/hr.    Total critical care time for above- 55 minutes                SUBJECTIVE     [name] states feeling better, having multiple bowel movements; breathing much improved.  History of poorly controlled diabetes.  Working with her endocrinologist for adjustment of medications.      MEDICATIONS     Current Facility-Administered Medications   Medication Dose Route Frequency   . albuterol-ipratropium  3 mL Nebulization Q4H SCH   . apixaban  5 mg Oral Q12H   . atorvastatin  20 mg Oral QHS   . carvedilol  25 mg Oral Q12H SCH   . gabapentin  600 mg Oral TID   . hydrALAZINE  50 mg Oral BID   . insulin glargine  40 Units Subcutaneous Q12H   . insulin lispro  2-10 Units Subcutaneous Q4H SCH   . insulin lispro  20 Units Subcutaneous Q4H   .  pantoprazole  40 mg Oral QAM AC       PHYSICAL EXAM     Vitals:    08/11/20 1720   BP: 142/85   Pulse:    Resp:    Temp:    SpO2:        Temperature: Temp  Min: 97.7 F (36.5 C)  Max: 99.1 F (37.3 C)  Pulse: Pulse  Min: 86  Max: 98  Respiratory: Resp  Min: 15  Max: 20  Non-Invasive BP: BP  Min: 101/66  Max: 150/85  Pulse Oximetry SpO2  Min: 96 %  Max: 99 %    Intake and Output Summary (Last 24 hours) at Date Time    Intake/Output Summary (Last 24 hours) at 08/11/2020 1833  Last data filed at 08/11/2020 1500  Gross per 24 hour   Intake 1260 ml   Output 600 ml   Net 660 ml         GEN APPEARANCE: Normal;  A&OX3  HEENT: PERLA; EOMI; Conjunctiva Clear  NECK: Supple; No bruits  CVS: RRR, S1, S2; No M/G/R  LUNGS: CTAB; No  Wheezes; No Rhonchi: No rales  ABD: Soft; No TTP; + Normoactive BS  EXT: No edema; Pulses 2+ and intact  Skin exam:  pink  NEURO: CN 2-12 intact; No Focal neurological deficits  CAP REFILL:  Normal  MENTAL STATUS:  Normal    Exam done by Anola Gurney, MD on 08/11/20 at 12:37 PM      LABS     Recent Labs   Lab 08/11/20  0255 08/10/20  2159   WBC 12.46* 13.09*   RBC 3.72* 4.02   Hgb 9.6* 10.4*   Hematocrit 30.5* 32.9*   MCV 82.0 81.8   Platelets 353* 404*       Recent Labs   Lab 08/11/20  1608 08/11/20  0848 08/11/20  0255 08/10/20  2159   Sodium 127* 127* 127* 128*   Potassium 4.8 5.1 5.1 4.3   Chloride 93* 94* 93* 91*   CO2 21* 16* 22 24   BUN 84.0* 75.0* 80.0* 82.0*   Creatinine 2.3* 2.1* 2.1* 2.2*   Glucose 651* 652* 598* 398*   Calcium 9.7 9.1 9.4 9.6       Recent Labs   Lab 08/10/20  2159   ALT 17   AST (SGOT) 11   Bilirubin, Total 0.3   Albumin 3.6   Alkaline Phosphatase 104       Recent Labs   Lab 08/11/20  0848 08/11/20  0255 08/10/20  2159   Troponin I <0.01 <0.01  <0.01 <0.01       Recent Labs   Lab 08/11/20  0848   PT INR 1.2*   PT 13.6*       Microbiology Results (last 15 days)     Procedure Component Value Units Date/Time    RSV screen [[id number]] Collected: 08/11/20 0747    Order  Status: Completed Specimen: Nasopharyngeal from Nasal Aspirate Updated: 08/11/20 0833    Narrative:      ORDER#: [id number]                                    ORDERED BY: Greig Castilla, LUBNA  SOURCE: Nasal Aspirate                               COLLECTED:  08/11/20 07:47  ANTIBIOTICS AT COLL.:                                RECEIVED :  08/11/20 07:53  RSV, Rapid Antigen                         FINAL       08/11/20 08:33  08/11/20   Negative for RSV Antigen             Reference Range: Negative      COVID-19 (SARS-COV-2) and Influenza Raina Mina Rapid) [[id number]] Collected: 08/10/20 2159    Order Status: Completed Specimen: Culturette from Nasopharyngeal Updated: 08/10/20 2242     Purpose of COVID testing Diagnostic -PUI     SARS-CoV-2 Specimen Source Nasopharyngeal     SARS CoV 2 Overall Result Not Detected     Comment: Test performed using the Roche cobas Liat SARS-CoV-2 assay. This assay is  only for use under the Food and Drug Administration's Emergency Use  Authorization. This  is a real-time RT-PCR assay for the qualitative  detection of SARS-CoV-2 RNA. Viral nucleic acids may persist in vivo,  independent of viability. Detection of viral nucleic acid does not imply the  presence of infectious virus, or that virus nucleic acid is the cause of  clinical symptoms. Negative results do not preclude SARS-CoV-2 infection and  should not be used as the sole basis for diagnosis, treatment or other  patient management decisions. Negative results must be combined with  clinical observations, patient history, and/or epidemiological information.  Invalid results may be due to inhibiting substances in the specimen and  recollection should occur. Please see Fact Sheets for patients and providers  located:  WirelessDSLBlog.no.          Influenza A Not Detected     Influenza B Not Detected     Comment: Test performed using the Roche cobas Liat SARS-CoV-2 & Influenza A/B assay.  This assay is only for use under  the Food and Drug Administration's  Emergency Use Authorization. This is a multiplex real-time RT-PCR assay  intended for the simultaneous in vitro qualitative detection and  differentiation of SARS-CoV-2, influenza A, and influenza B virus RNA. Viral  nucleic acids may persist in vivo, independent of viability. Detection of  viral nucleic acid does not imply the presence of infectious virus, or that  virus nucleic acid is the cause of clinical symptoms. Negative results do  not preclude SARS-CoV-2, influenza A, and/or influenza B infection and  should not be used as the sole basis for diagnosis, treatment or other  patient management decisions. Negative results must be combined with  clinical observations, patient history, and/or epidemiological information.  Invalid results may be due to inhibiting substances in the specimen and  recollection should occur. Please see Fact Sheets for patients and providers  located: http://www.rice.biz/.         Narrative:      o Collect and clearly label specimen type:  o PREFERRED-Upper respiratory specimen: One Nasopharyngeal  Swab in Transport Media.  o Hand deliver to laboratory ASAP  Diagnostic -PUI    Blood Culture Aerobic/Anaerobic #1 [[id number]] Collected: 08/10/20 2159    Order Status: Sent Specimen: Arm from Blood, Venipuncture Updated: 08/11/20 0148    Narrative:      1 BLUE+1 PURPLE    Blood Culture Aerobic/Anaerobic #2 [[id number]] Collected: 08/10/20 2159    Order Status: Sent Specimen: Arm from Blood, Venipuncture Updated: 08/11/20 0148    Narrative:      1 BLUE+1 PURPLE    COVID-19 (SARS-COV-2) and Influenza (Liat Rapid) [[id number]]     Order Status: Sent Specimen: Culturette from Nasopharyngeal            XR Chest PA Or AP    Result Date: 08/10/2020    No acute cardiopulmonary process. Wyatt Portela, MD  08/10/2020 9:40 PM        Echo Results     None          RADIOLOGY     Upon my review:    Signed,  Anola Gurney  6:33 PM 08/11/2020

## 2020-08-11 NOTE — Progress Notes (Signed)
Pt's BG was high >600 this morning at 5:30 AM. Received 7 units of insulin and NS of 500 ml bolus, ordered by Dr. Greig Castilla. Rechecked at 6:30, BG remain high >600, 5 units of AC insulin given. Will continue to monitor pt's BG.

## 2020-08-12 LAB — GFR
EGFR: 27.5
EGFR: 31
EGFR: 33.1
EGFR: 33.1

## 2020-08-12 LAB — BASIC METABOLIC PANEL
Anion Gap: 10 (ref 5.0–15.0)
Anion Gap: 10 (ref 5.0–15.0)
Anion Gap: 11 (ref 5.0–15.0)
Anion Gap: 6 (ref 5.0–15.0)
BUN: 76 mg/dL — ABNORMAL HIGH (ref 7.0–19.0)
BUN: 77 mg/dL — ABNORMAL HIGH (ref 7.0–19.0)
BUN: 83 mg/dL — ABNORMAL HIGH (ref 7.0–19.0)
BUN: 86 mg/dL — ABNORMAL HIGH (ref 7.0–19.0)
CO2: 21 mEq/L — ABNORMAL LOW (ref 22–29)
CO2: 22 mEq/L (ref 22–29)
CO2: 23 mEq/L (ref 22–29)
CO2: 26 mEq/L (ref 22–29)
Calcium: 8.9 mg/dL (ref 8.5–10.5)
Calcium: 9 mg/dL (ref 8.5–10.5)
Calcium: 9.2 mg/dL (ref 8.5–10.5)
Calcium: 9.4 mg/dL (ref 8.5–10.5)
Chloride: 101 mEq/L (ref 100–111)
Chloride: 102 mEq/L (ref 100–111)
Chloride: 102 mEq/L (ref 100–111)
Chloride: 98 mEq/L — ABNORMAL LOW (ref 100–111)
Creatinine: 1.7 mg/dL — ABNORMAL HIGH (ref 0.6–1.0)
Creatinine: 1.7 mg/dL — ABNORMAL HIGH (ref 0.6–1.0)
Creatinine: 1.8 mg/dL — ABNORMAL HIGH (ref 0.6–1.0)
Creatinine: 2 mg/dL — ABNORMAL HIGH (ref 0.6–1.0)
Glucose: 214 mg/dL — ABNORMAL HIGH (ref 70–100)
Glucose: 263 mg/dL — ABNORMAL HIGH (ref 70–100)
Glucose: 266 mg/dL — ABNORMAL HIGH (ref 70–100)
Glucose: 304 mg/dL — ABNORMAL HIGH (ref 70–100)
Potassium: 4 mEq/L (ref 3.5–5.1)
Potassium: 4.2 mEq/L (ref 3.5–5.1)
Potassium: 4.3 mEq/L (ref 3.5–5.1)
Potassium: 4.5 mEq/L (ref 3.5–5.1)
Sodium: 131 mEq/L — ABNORMAL LOW (ref 136–145)
Sodium: 132 mEq/L — ABNORMAL LOW (ref 136–145)
Sodium: 134 mEq/L — ABNORMAL LOW (ref 136–145)
Sodium: 135 mEq/L — ABNORMAL LOW (ref 136–145)

## 2020-08-12 LAB — CBC
Absolute NRBC: 0 10*3/uL (ref 0.00–0.00)
Hematocrit: 27.4 % — ABNORMAL LOW (ref 34.7–43.7)
Hgb: 8.6 g/dL — ABNORMAL LOW (ref 11.4–14.8)
MCH: 26 pg (ref 25.1–33.5)
MCHC: 31.4 g/dL — ABNORMAL LOW (ref 31.5–35.8)
MCV: 82.8 fL (ref 78.0–96.0)
MPV: 11.2 fL (ref 8.9–12.5)
Nucleated RBC: 0 /100 WBC (ref 0.0–0.0)
Platelets: 349 10*3/uL — ABNORMAL HIGH (ref 142–346)
RBC: 3.31 10*6/uL — ABNORMAL LOW (ref 3.90–5.10)
RDW: 12 % (ref 11–15)
WBC: 17.03 10*3/uL — ABNORMAL HIGH (ref 3.10–9.50)

## 2020-08-12 LAB — GLUCOSE WHOLE BLOOD - POCT
Whole Blood Glucose POCT: 178 mg/dL — ABNORMAL HIGH (ref 70–100)
Whole Blood Glucose POCT: 200 mg/dL — ABNORMAL HIGH (ref 70–100)
Whole Blood Glucose POCT: 212 mg/dL — ABNORMAL HIGH (ref 70–100)
Whole Blood Glucose POCT: 219 mg/dL — ABNORMAL HIGH (ref 70–100)
Whole Blood Glucose POCT: 226 mg/dL — ABNORMAL HIGH (ref 70–100)
Whole Blood Glucose POCT: 227 mg/dL — ABNORMAL HIGH (ref 70–100)
Whole Blood Glucose POCT: 241 mg/dL — ABNORMAL HIGH (ref 70–100)
Whole Blood Glucose POCT: 245 mg/dL — ABNORMAL HIGH (ref 70–100)
Whole Blood Glucose POCT: 255 mg/dL — ABNORMAL HIGH (ref 70–100)
Whole Blood Glucose POCT: 266 mg/dL — ABNORMAL HIGH (ref 70–100)
Whole Blood Glucose POCT: 329 mg/dL — ABNORMAL HIGH (ref 70–100)
Whole Blood Glucose POCT: 366 mg/dL — ABNORMAL HIGH (ref 70–100)
Whole Blood Glucose POCT: 396 mg/dL — ABNORMAL HIGH (ref 70–100)
Whole Blood Glucose POCT: 406 mg/dL — ABNORMAL HIGH (ref 70–100)

## 2020-08-12 LAB — HEMOLYSIS INDEX
Hemolysis Index: 113 — ABNORMAL HIGH (ref 0–18)
Hemolysis Index: 3 (ref 0–18)
Hemolysis Index: 6 (ref 0–18)
Hemolysis Index: 6 (ref 0–18)

## 2020-08-12 LAB — MAGNESIUM
Magnesium: 2.2 mg/dL (ref 1.6–2.6)
Magnesium: 2.2 mg/dL (ref 1.6–2.6)
Magnesium: 2.3 mg/dL (ref 1.6–2.6)
Magnesium: 2.3 mg/dL (ref 1.6–2.6)

## 2020-08-12 LAB — PHOSPHORUS
Phosphorus: 3.1 mg/dL (ref 2.3–4.7)
Phosphorus: 3.8 mg/dL (ref 2.3–4.7)
Phosphorus: 3.8 mg/dL (ref 2.3–4.7)
Phosphorus: 4.2 mg/dL (ref 2.3–4.7)

## 2020-08-12 LAB — MRSA CULTURE
Culture MRSA Surveillance: NEGATIVE
Culture MRSA Surveillance: POSITIVE — AB

## 2020-08-12 MED ORDER — INSULIN GLARGINE 100 UNIT/ML SC SOLN
40.0000 [IU] | Freq: Two times a day (BID) | SUBCUTANEOUS | Status: DC
Start: 2020-08-12 — End: 2020-08-12
  Administered 2020-08-12: 09:00:00 40 [IU] via SUBCUTANEOUS
  Filled 2020-08-12: qty 40

## 2020-08-12 MED ORDER — ALBUTEROL-IPRATROPIUM 2.5-0.5 (3) MG/3ML IN SOLN
3.0000 mL | Freq: Four times a day (QID) | RESPIRATORY_TRACT | Status: DC
Start: 2020-08-12 — End: 2020-08-13
  Administered 2020-08-12 – 2020-08-13 (×6): 3 mL via RESPIRATORY_TRACT
  Filled 2020-08-12 (×6): qty 3

## 2020-08-12 MED ORDER — BUTALBITAL-APAP-CAFFEINE 50-325-40 MG PO TABS
2.0000 | ORAL_TABLET | Freq: Once | ORAL | Status: AC
Start: 2020-08-12 — End: 2020-08-12
  Administered 2020-08-12: 15:00:00 2 via ORAL
  Filled 2020-08-12: qty 2

## 2020-08-12 MED ORDER — DEXTROSE-SODIUM CHLORIDE 5-0.9 % IV SOLN
INTRAVENOUS | Status: DC
Start: 2020-08-12 — End: 2020-08-12

## 2020-08-12 MED ORDER — SODIUM CHLORIDE 0.9 % IV BOLUS
1000.0000 mL | Freq: Once | INTRAVENOUS | Status: AC
Start: 2020-08-12 — End: 2020-08-12
  Administered 2020-08-12: 1000 mL via INTRAVENOUS

## 2020-08-12 MED ORDER — GLUCAGON 1 MG IJ SOLR (WRAP)
1.0000 mg | INTRAMUSCULAR | Status: DC | PRN
Start: 2020-08-12 — End: 2020-08-12

## 2020-08-12 MED ORDER — INSULIN LISPRO 100 UNIT/ML SC SOLN
15.0000 [IU] | Freq: Three times a day (TID) | SUBCUTANEOUS | Status: DC
Start: 2020-08-12 — End: 2020-08-13
  Administered 2020-08-12: 17:00:00 15 [IU] via SUBCUTANEOUS

## 2020-08-12 MED ORDER — HYDROXYZINE HCL 10 MG PO TABS
10.0000 mg | ORAL_TABLET | Freq: Four times a day (QID) | ORAL | Status: DC | PRN
Start: 2020-08-12 — End: 2020-08-13
  Administered 2020-08-12: 05:00:00 10 mg via ORAL
  Filled 2020-08-12 (×4): qty 1

## 2020-08-12 MED ORDER — INSULIN LISPRO 100 UNIT/ML SC SOLN
2.0000 [IU] | Freq: Three times a day (TID) | SUBCUTANEOUS | Status: DC
Start: 2020-08-12 — End: 2020-08-12
  Administered 2020-08-12: 15:00:00 4 [IU] via SUBCUTANEOUS
  Administered 2020-08-12: 17:00:00 10 [IU] via SUBCUTANEOUS
  Filled 2020-08-12: qty 75

## 2020-08-12 MED ORDER — INSULIN GLARGINE 100 UNIT/ML SC SOLN
50.0000 [IU] | Freq: Two times a day (BID) | SUBCUTANEOUS | Status: DC
Start: 2020-08-12 — End: 2020-08-13
  Administered 2020-08-12 – 2020-08-13 (×2): 50 [IU] via SUBCUTANEOUS
  Filled 2020-08-12 (×2): qty 50

## 2020-08-12 MED ORDER — INSULIN LISPRO 100 UNIT/ML SC SOLN
2.0000 [IU] | SUBCUTANEOUS | Status: DC
Start: 2020-08-12 — End: 2020-08-13
  Administered 2020-08-12: 20:00:00 10 [IU] via SUBCUTANEOUS
  Administered 2020-08-12: 8 [IU] via SUBCUTANEOUS
  Administered 2020-08-13: 05:00:00 4 [IU] via SUBCUTANEOUS
  Filled 2020-08-12: qty 30
  Filled 2020-08-12: qty 24
  Filled 2020-08-12: qty 12

## 2020-08-12 MED ORDER — SODIUM CHLORIDE 0.9 % IV SOLN
INTRAVENOUS | Status: AC
Start: 2020-08-12 — End: 2020-08-13

## 2020-08-12 MED ORDER — DEXTROSE 50 % IV SOLN
12.5000 g | INTRAVENOUS | Status: DC | PRN
Start: 2020-08-12 — End: 2020-08-12

## 2020-08-12 MED ORDER — INSULIN LISPRO 100 UNIT/ML SC SOLN
1.0000 [IU] | Freq: Every evening | SUBCUTANEOUS | Status: DC
Start: 2020-08-12 — End: 2020-08-12

## 2020-08-12 MED ORDER — GLUCOSE 40 % PO GEL
15.0000 g | ORAL | Status: DC | PRN
Start: 2020-08-12 — End: 2020-08-12

## 2020-08-12 MED ORDER — INSULIN LISPRO 100 UNIT/ML SC SOLN
10.0000 [IU] | Freq: Three times a day (TID) | SUBCUTANEOUS | Status: DC
Start: 2020-08-12 — End: 2020-08-12
  Administered 2020-08-12: 15:00:00 10 [IU] via SUBCUTANEOUS
  Filled 2020-08-12: qty 30

## 2020-08-12 MED ORDER — ACETAMINOPHEN 500 MG PO TABS
1000.0000 mg | ORAL_TABLET | Freq: Four times a day (QID) | ORAL | Status: DC | PRN
Start: 2020-08-12 — End: 2020-08-13
  Administered 2020-08-12: 1000 mg via ORAL
  Filled 2020-08-12: qty 2

## 2020-08-12 MED ORDER — BACLOFEN 10 MG PO TABS
10.0000 mg | ORAL_TABLET | Freq: Every day | ORAL | Status: DC | PRN
Start: 2020-08-12 — End: 2020-08-13
  Administered 2020-08-12 – 2020-08-13 (×2): 10 mg via ORAL
  Filled 2020-08-12 (×2): qty 1

## 2020-08-12 NOTE — Progress Notes (Signed)
ICU Daily Progress Note                                                                                                 [id number]    Date Time: 08/12/20 7:45 AM  Patient Name: [name], [name] 39 y.o. 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 S/p hysterectomy.   Leukocytosis without fever, likely reactive  Asthma  Hyperlipidemia  Possible obstructive sleep apnea  Plan:   Transition from insulin infusion to Lantus and SSI correctional  Blood glucose monitoring AC/at bedtime  Continue IV hydration  Renal function improved, continue strict I&O.  Replace electrolytes as needed  Renal ultrasound completed 10/2: No acute abnormality  Continue Eliquis  Continue Coreg and hydralazine for hypertension  Monitor H&H.  No overt signs of bleeding.  Transfuse for hemoglobin less than 7  Continue timed duo nebs  Atorvastatin 20 mg nightly  Continue nocturnal CPAP, patient tolerated well and expressed improved sleep.  Would benefit from an outpatient sleep study      Ordered midline placement      Code Status: full code    Plan of care discussed with ICU nurse, pharmacy, nutritionist and respiratory therapy  Subjective:     Patient is alert and interactive, states that she feels much better.  Slept well on CPAP overnight    Medications:      Scheduled Meds: PRN Meds:    albuterol-ipratropium, 3 mL,  Nebulization, Q6H SCH  apixaban, 5 mg, Oral, Q12H  atorvastatin, 20 mg, Oral, QHS  carvedilol, 25 mg, Oral, Q12H SCH  gabapentin, 600 mg, Oral, Q8H SCH  hydrALAZINE, 50 mg, Oral, BID  pantoprazole, 40 mg, Oral, QAM AC  senna-docusate, 1 tablet, Oral, Q12H SCH        Continuous Infusions:  . dextrose 5 % and 0.9% NaCl 125 mL/hr at 08/12/20 0700   . insulin regular 4 Units/kg/hr (08/12/20 0717)    acetaminophen, 650 mg, Q6H PRN   Or  acetaminophen, 650 mg, Q6H PRN  albuterol-ipratropium, 3 mL, Q4H PRN  dextrose, 15 g of glucose, PRN   And  dextrose, 12.5 g, PRN   And  glucagon (rDNA), 1 mg, PRN  dextrose, 25 mL, PRN  dextrose, 50 mL, PRN  hydrOXYzine, 10 mg, Q6H PRN  melatonin, 3 mg, QHS PRN  naloxone, 0.2 mg, PRN  nitroglycerin, 0.4 mg, Q5 Min PRN  ondansetron, 4 mg, Q6H PRN   Or  ondansetron, 4 mg, Q6H  PRN          Review of Systems:        General ROS:  Afebrile no weight loss weight gain   ENT ROS:  No vision changes, No headache, No sore throat, no nasal discharge   Endocrine ROS:  No fatigue   Respiratory ROS:  No shortness of breath, wheezing, cough, chest congestion    Cardiovascular ROS:  No chest pain or palpitation, no edema   Gastrointestinal ROS:  No nausea vomiting diarrhea. No anorexia No melanotic stool   Genito-Urinary ROS:  No burning in the urine or hematuria   Musculoskeletal ROS:  No musculoskeletal deformities   Neurological ROS:  No stroke seizure disorder   Dermatological ROS:  No skin rash      Physical Exam:     General appearance - no visible respiratory distress patient does not appear toxic  Mental status - Alert and oriented x3  Eyes - EOMI PERRLA  Nose - no nasal discharge  Mouth - mucous membrane is moist.  No evidence of sore throat  Neck - no JVD lymphadenopathy or thyromegaly  Chest - clear to auscultation  Heart - S1-S2 RRR no S3-S4 no murmur  Abdomen - soft, obese, nontender bowel sounds are normal with no hepatosplenomegaly  Neurological - no motor or sensory  deficit  Extremities - no edema clubbing or cyanosis  Skin - no skin rash       Data:     Patient Lines/Drains/Airways Status    Active PICC Line / CVC Line / PIV Line / Drain / Airway / Intraosseous Line / Epidural Line / ART Line / Line / Wound / Pressure Ulcer / NG/OG Tube     Name:   Placement date:   Placement time:   Site:   Days:    Peripheral IV 08/10/20 20 G Right Upper Arm   08/10/20    2224    Upper Arm   1    Peripheral IV 08/12/20 20 G Right Forearm   08/12/20    0200    Forearm   less than 1    External Urinary Catheter   08/11/20    0200    --   1              VITAL SIGNS   Temp:  [97.7 F (36.5 C)-98.8 F (37.1 C)] 97.7 F (36.5 C)  Heart Rate:  [67-98] 69  Resp Rate:  [11-19] 11  BP: (87-174)/(50-102) 119/61  Pulse ox:    Intake/Output Summary (Last 24 hours) at 08/12/2020 0745  Last data filed at 08/12/2020 0700  Gross per 24 hour   Intake 3047.6 ml   Output 700 ml   Net 2347.6 ml        Labs:     CBC w/Diff CMP   Recent Labs   Lab 08/12/20  0347 08/11/20  0255 08/10/20  2159   WBC 17.03* 12.46* 13.09*   Hgb 8.6* 9.6* 10.4*   Hematocrit 27.4* 30.5* 32.9*   Platelets 349* 353* 404*   MCV 82.8 82.0 81.8   Neutrophils  --   --  61.8       PT/INR   Recent Labs   Lab 08/11/20  0848   PT INR 1.2*       Recent Labs   Lab 08/12/20  0347 08/12/20  0015 08/11/20  2048 08/11/20  0255 08/10/20  2159   Sodium 132* 131* 127*  More results in Results  Review 128*   Potassium 4.5 4.3 4.7  More results in Results Review 4.3   Chloride 101 98* 93*  More results in Results Review 91*   CO2 21* 23 19*  More results in Results Review 24   BUN 83.0* 86.0* 81.0*  More results in Results Review 82.0*   Creatinine 1.8* 2.0* 2.3*  More results in Results Review 2.2*   Glucose 263* 304* 593*  More results in Results Review 398*   Calcium 8.9 9.4 9.4  More results in Results Review 9.6   Magnesium 2.3 2.2 2.3  --   --    Phosphorus 3.8 3.1 3.4  --   --    Protein, Total  --   --   --   --  7.8   Albumin  --   --   --   --   3.6   AST (SGOT)  --   --   --   --  11   ALT  --   --   --   --  17   Alkaline Phosphatase  --   --   --   --  104   Bilirubin, Total  --   --   --   --  0.3   More results in Results Review = values in this interval not displayed.      Glucose POCT   Recent Labs   Lab 08/12/20  0347 08/12/20  0015 08/11/20  2048 08/11/20  1608 08/11/20  0848 08/11/20  0255 08/10/20  2159   Glucose 263* 304* 593* 651* 652* 598* 398*        Recent Labs   Lab 08/11/20  0848 08/11/20  0255 08/10/20  2159   Troponin I <0.01 <0.01  <0.01 <0.01       Urinalysis  Recent Labs   Lab 08/11/20  0848   Urine Type Urine, Clean Ca   Color, UA Straw   Clarity, UA Clear   Specific Gravity UA 1.018   Urine pH 6.0   Nitrite, UA Negative   Ketones UA Trace*   Urobilinogen, UA Negative   Bilirubin, UA Negative   Blood, UA Small*   RBC, UA 0 - 2   WBC, UA 0 - 5       Rads:   US Renal Kidney    Result Date: 08/11/2020    Unremarkable appearance of the kidneys and bladder. Wyatt Portela, MD  08/11/2020 8:07 PM          I have personally reviewed the patient's history and 24 hour interval events, along with vitals, labs, radiology images     So far today I have spent _35__ minutes providing care for this patient excluding teaching and billable procedures, and not overlapping with any other providers.:    Sol Blazing MD   08/12/20 7:45 AM

## 2020-08-12 NOTE — Progress Notes (Signed)
FOUR EYES SKIN ASSESSMENT NOTE    [name]  September 09, 1981  [id number]    Braden Scale Score: 19    POC Initiated for Risk for Altered Skin Yes    Patient Assessed for Correct Mattress Surface Yes  *At risk patients with Braden Score less than 12 must be considered for specialty bed    Mepilex or Adhesive Foam Dressing applied to sacrum/heel if any PI risk factors present: Yes    If Wound/Pressure Injury present:    Wound/PI assessment documented in EHR: No      Admitting physician notified: No      Wound consult ordered: No      Hiram Gash, RN  August 12, 2020  7:50 AM    Second RN/PCT Name:

## 2020-08-12 NOTE — Progress Notes (Signed)
[facility]  PROGRESS NOTE      Patient: [name]  Date: 08/12/2020   LOS: 1 Days  Admission Date: 08/10/2020   MRN: [id number]  Attending: Anola Gurney         ASSESSMENT/PLAN     [name] is a 39 y.o. female admitted with AKI (acute kidney injury)    Interval Summary:     Active Hospital Problems    Diagnosis   . AKI (acute kidney injury)   . Drug-induced hyperglycemia     Steroid-induced hyperglycemia, severe, improved s/p insulin drip.  Acute asthma exacerbation, improved.  Elevated AGMA, resolved.  Pseudohyponatremia, resolved.  AKI on CKD, improved.  History of PE.  Chronic heart failure with preserved ejection fraction/diastolic dysfunction, well compensated.  Essential hypertension.  Class III obesity.  DM with neuropathy.        Start Premeal insulin at 10 units with high intensity sliding scale insulin.  - Used very little insulin drip to correct (30 units)  - Closely monitor glu  Continue Lantus 40 units twice daily.  Avoid steroids.  Continue DuoNebs every 6 hours.  Continue carvedilol 25 mg twice daily.  Continue gabapentin 600 mg every 8 hours.  Continue Eliquis 5 mg twice daily.  Continue Lipitor 20 mg nightly.  Continue hydralazine 50 mg twice daily.  Continue Protonix 40 mg daily.      Update-  Hyperglycemia worsening  -  Increase premeal insulin to 15 units, continue SSI  -  Increase lantus to 50 units BID              Analgesia: Acetaminophen    Nutrition: Consistent carbs    DVT Prophylaxis: Eliquis       Code Status: Full code    DISPO: Inpatient, anticipate discharge in 24 to 48 hours    Family Contact: Daughter       SUBJECTIVE     [name] states feels well, breathing is improved.      Having headache.      MEDICATIONS     Current Facility-Administered Medications   Medication Dose Route Frequency   . albuterol-ipratropium  3 mL Nebulization Q6H SCH   . apixaban  5 mg Oral Q12H   . atorvastatin  20 mg Oral QHS   . carvedilol  25 mg Oral Q12H SCH   . gabapentin  600 mg  Oral Q8H SCH   . hydrALAZINE  50 mg Oral BID   . insulin glargine  40 Units Subcutaneous Q12H   . pantoprazole  40 mg Oral QAM AC   . senna-docusate  1 tablet Oral Q12H SCH       PHYSICAL EXAM     Vitals:    08/12/20 1200   BP: 128/58   Pulse: 68   Resp: 15   Temp: 97.9 F (36.6 C)   SpO2: 99%       Temperature: Temp  Min: 97.7 F (36.5 C)  Max: 98.8 F (37.1 C)  Pulse: Pulse  Min: 64  Max: 95  Respiratory: Resp  Min: 10  Max: 19  Non-Invasive BP: BP  Min: 87/50  Max: 174/86  Pulse Oximetry SpO2  Min: 90 %  Max: 100 %    Intake and Output Summary (Last 24 hours) at Date Time    Intake/Output Summary (Last 24 hours) at 08/12/2020 1311  Last data filed at 08/12/2020 1200  Gross per 24 hour   Intake 2952.6 ml   Output 1400 ml  Net 1552.6 ml         GEN APPEARANCE: Normal;  A&OX3  HEENT: PERLA; EOMI; Conjunctiva Clear  NECK: Supple; No bruits  CVS: RRR, S1, S2; No M/G/R  LUNGS: CTAB; No Wheezes; No Rhonchi: No rales  ABD: Soft; No TTP; + Normoactive BS  EXT: No edema; Pulses 2+ and intact  Skin exam:  pink  NEURO: CN 2-12 intact; No Focal neurological deficits  CAP REFILL:  Normal  MENTAL STATUS:  Normal    Exam done by Anola Gurney, MD on 08/12/20 at 1:11 PM      LABS     Recent Labs   Lab 08/12/20  0347 08/11/20  0255 08/10/20  2159   WBC 17.03* 12.46* 13.09*   RBC 3.31* 3.72* 4.02   Hgb 8.6* 9.6* 10.4*   Hematocrit 27.4* 30.5* 32.9*   MCV 82.8 82.0 81.8   Platelets 349* 353* 404*       Recent Labs   Lab 08/12/20  1156 08/12/20  0746 08/12/20  0347 08/12/20  0015 08/11/20  2048   Sodium 134* 135* 132* 131* 127*   Potassium 4.2 4.0 4.5 4.3 4.7   Chloride 102 102 101 98* 93*   CO2 26 22 21* 23 19*   BUN 76.0* 77.0* 83.0* 86.0* 81.0*   Creatinine 1.7* 1.7* 1.8* 2.0* 2.3*   Glucose 266* 214* 263* 304* 593*   Calcium 9.2 9.0 8.9 9.4 9.4   Magnesium 2.2 2.3 2.3 2.2 2.3       Recent Labs   Lab 08/10/20  2159   ALT 17   AST (SGOT) 11   Bilirubin, Total 0.3   Albumin 3.6   Alkaline Phosphatase 104       Recent Labs   Lab  08/11/20  0848 08/11/20  0255 08/10/20  2159   Troponin I <0.01 <0.01  <0.01 <0.01       Recent Labs   Lab 08/11/20  0848   PT INR 1.2*   PT 13.6*       Microbiology Results (last 15 days)     Procedure Component Value Units Date/Time    MRSA culture - Nares (If not done in triage) [[id number]] Collected: 08/11/20 2048    Order Status: Sent Specimen: Culturette from Nares Updated: 08/12/20 0316    MRSA culture - Throat (If not done in triage) [[id number]] Collected: 08/11/20 2048    Order Status: Sent Specimen: Culturette from Throat Updated: 08/12/20 0316    RSV screen [[id number]] Collected: 08/11/20 0747    Order Status: Completed Specimen: Nasopharyngeal from Nasal Aspirate Updated: 08/11/20 0833    Narrative:      ORDER#: [id number]                                    ORDERED BY: Marya Amsler  SOURCE: Nasal Aspirate                               COLLECTED:  08/11/20 07:47  ANTIBIOTICS AT COLL.:                                RECEIVED :  08/11/20 07:53  RSV, Rapid Antigen  FINAL       08/11/20 08:33  08/11/20   Negative for RSV Antigen             Reference Range: Negative      COVID-19 (SARS-COV-2) and Influenza Raina Mina Rapid) [[id number]] Collected: 08/10/20 2159    Order Status: Completed Specimen: Culturette from Nasopharyngeal Updated: 08/10/20 2242     Purpose of COVID testing Diagnostic -PUI     SARS-CoV-2 Specimen Source Nasopharyngeal     SARS CoV 2 Overall Result Not Detected     Comment: Test performed using the Roche cobas Liat SARS-CoV-2 assay. This assay is  only for use under the Food and Drug Administration's Emergency Use  Authorization. This is a real-time RT-PCR assay for the qualitative  detection of SARS-CoV-2 RNA. Viral nucleic acids may persist in vivo,  independent of viability. Detection of viral nucleic acid does not imply the  presence of infectious virus, or that virus nucleic acid is the cause of  clinical symptoms. Negative results do not preclude SARS-CoV-2  infection and  should not be used as the sole basis for diagnosis, treatment or other  patient management decisions. Negative results must be combined with  clinical observations, patient history, and/or epidemiological information.  Invalid results may be due to inhibiting substances in the specimen and  recollection should occur. Please see Fact Sheets for patients and providers  located:  [employer]lessDSLBlog.no.          Influenza A Not Detected     Influenza B Not Detected     Comment: Test performed using the Roche cobas Liat SARS-CoV-2 & Influenza A/B assay.  This assay is only for use under the Food and Drug Administration's  Emergency Use Authorization. This is a multiplex real-time RT-PCR assay  intended for the simultaneous in vitro qualitative detection and  differentiation of SARS-CoV-2, influenza A, and influenza B virus RNA. Viral  nucleic acids may persist in vivo, independent of viability. Detection of  viral nucleic acid does not imply the presence of infectious virus, or that  virus nucleic acid is the cause of clinical symptoms. Negative results do  not preclude SARS-CoV-2, influenza A, and/or influenza B infection and  should not be used as the sole basis for diagnosis, treatment or other  patient management decisions. Negative results must be combined with  clinical observations, patient history, and/or epidemiological information.  Invalid results may be due to inhibiting substances in the specimen and  recollection should occur. Please see Fact Sheets for patients and providers  located: http://www.rice.biz/.         Narrative:      o Collect and clearly label specimen type:  o PREFERRED-Upper respiratory specimen: One Nasopharyngeal  Swab in Transport Media.  o Hand deliver to laboratory ASAP  Diagnostic -PUI    Blood Culture Aerobic/Anaerobic #1 [[id number]] Collected: 08/10/20 2159    Order Status: Completed Specimen: Arm from Blood, Venipuncture  Updated: 08/12/20 0221    Narrative:      ORDER#: [id number]                                    ORDERED BY: SANDOVAL, LYNET  SOURCE: Blood, Venipuncture arm                      COLLECTED:  08/10/20 21:59  ANTIBIOTICS AT COLL.:  RECEIVED :  08/11/20 01:48  Culture Blood Aerobic and Anaerobic        PRELIM      08/12/20 02:21  08/12/20   No Growth after 1 day/s of incubation.      Blood Culture Aerobic/Anaerobic #2 [[id number]] Collected: 08/10/20 2159    Order Status: Completed Specimen: Arm from Blood, Venipuncture Updated: 08/12/20 0221    Narrative:      ORDER#: [id number]                                    ORDERED BY: SANDOVAL, LYNET  SOURCE: Blood, Venipuncture arm                      COLLECTED:  08/10/20 21:59  ANTIBIOTICS AT COLL.:                                RECEIVED :  08/11/20 01:48  Culture Blood Aerobic and Anaerobic        PRELIM      08/12/20 02:21  08/12/20   No Growth after 1 day/s of incubation.      COVID-19 (SARS-COV-2) and Influenza Raina Mina Rapid) [[id number]]     Order Status: Sent Specimen: Culturette from Nasopharyngeal            XR Chest PA Or AP    Result Date: 08/10/2020    No acute cardiopulmonary process. Wyatt Portela, MD  08/10/2020 9:40 PM    US Renal Kidney    Result Date: 08/11/2020    Unremarkable appearance of the kidneys and bladder. Wyatt Portela, MD  08/11/2020 8:07 PM        Echo Results     None          RADIOLOGY     Upon my review:    Signed,  Anola Gurney  1:11 PM 08/12/2020

## 2020-08-12 NOTE — Plan of Care (Addendum)
Pt A&Ox4. Insulin gtt d/c. Glucose remains elevated. Mariah Milling, MD and Bennie Pierini, NP notified, pt to remain in ICU until controlled glucose <200 mg/dL. Continue q4h glucose monitoring.    Problem: Safety  Goal: Patient will be free from injury during hospitalization  Outcome: Progressing  Flowsheets (Taken 08/12/2020 0940)  Patient will be free from injury during hospitalization:  . Assess patient's risk for falls and implement fall prevention plan of care per policy  . Use appropriate transfer methods  . Ensure appropriate safety devices are available at the bedside  . Provide and maintain safe environment  . Hourly rounding  . Include patient/ family/ care giver in decisions related to safety  . Assess for patients risk for elopement and implement Elopement Risk Plan per policy     Problem: Pain  Goal: Pain at adequate level as identified by patient  Outcome: Progressing  Flowsheets (Taken 08/12/2020 0940)  Pain at adequate level as identified by patient:  . Identify patient comfort function goal  . Assess for risk of opioid induced respiratory depression, including snoring/sleep apnea. Alert healthcare team of risk factors identified.  . Assess pain on admission, during daily assessment and/or before any "as needed" intervention(s)  . Reassess pain within 30-60 minutes of any procedure/intervention, per Pain Assessment, Intervention, Reassessment (AIR) Cycle  . Evaluate patient's satisfaction with pain management progress  . Evaluate if patient comfort function goal is met  . Offer non-pharmacological pain management interventions     Problem: Moderate/High Fall Risk Score >5  Goal: Patient will remain free of falls  Outcome: Progressing  Flowsheets (Taken 08/12/2020 0800)  Moderate Risk (6-13):  Marland Kitchen MOD-Remain with patient during toileting  . MOD-Place bedside commode and assistive devices out of sight when not in use  . MOD-Utilize diversion activities  . MOD-Perform dangle, stand, walk (DSW) prior to mobilization  .  MOD-Use gait belt when appropriate     Problem: Diabetes: Glucose Imbalance  Goal: Blood glucose stable at established goal  Outcome: Progressing  Flowsheets (Taken 08/12/2020 0940)  Blood glucose stable at established goal:  . Monitor lab values  . Monitor intake and output.  Notify LIP if urine output is < 30 mL/hour.  . Follow fluid restrictions/IV/PO parameters  . Include patient/family in decisions related to nutrition/dietary selections  . Monitor/assess vital signs  . Assess for hypoglycemia /hyperglycemia  . Coordinate medication administration with meals, as indicated  . Ensure appropriate diet and assess tolerance  . Ensure adequate hydration     Problem: Hemodynamic Status: Cardiac  Goal: Stable vital signs and fluid balance  Outcome: Progressing  Flowsheets (Taken 08/12/2020 0940)  Stable vital signs and fluid balance:  . Monitor/assess vital signs and telemetry per unit protocol  . Weigh on admission and record weight daily  . Monitor intake/output per unit protocol and/or LIP order  . Monitor for leg swelling/edema and report to LIP if abnormal  . Assess signs and symptoms associated with cardiac rhythm changes  . Monitor lab values

## 2020-08-12 NOTE — Progress Notes (Signed)
FOUR EYES SKIN ASSESSMENT NOTE    [name]  [birth year]  [id number]    Braden Scale Score: 19    POC Initiated for Risk for Altered Skin Yes    Patient Assessed for Correct Mattress Surface Yes  *At risk patients with Braden Score less than 12 must be considered for specialty bed    Mepilex or Adhesive Foam Dressing applied to sacrum/heel if any PI risk factors present: Yes    If Wound/Pressure Injury present:    Wound/PI assessment documented in EHR: No      Admitting physician notified: No      Wound consult ordered: No      Julio Alm, RN  August 12, 2020  6:44 PM    Second RN/PCT Name:

## 2020-08-12 NOTE — Plan of Care (Signed)
Neuro: A+Ox4, independent  Respiratory: CTA. RA.   Cardiac: NSR. HR: 68-90, SBP: 87-174, MAP 64-130. Generalized nonpitting edema, LLE 1+.   GI: Diet CHO. LBM 10/2. On pericolace.   GU: Continent x2. External cath in place. 750 UO this shift.   Skin: Scattered tattoos.   Mobility: walks in room, standby assist.     POC: IVF, Insulin drip. monitor labs + vitals  Shift events: Sleep apnea, CPAP used. Prn neb tx. Nausea, given zofran to good effect. IV placement difficulty, insulin paused for 1L bolus.       Problem: Pain  Goal: Pain at adequate level as identified by patient  Outcome: Progressing  Flowsheets (Taken 08/12/2020 0743)  Pain at adequate level as identified by patient:   Identify patient comfort function goal   Assess for risk of opioid induced respiratory depression, including snoring/sleep apnea. Alert healthcare team of risk factors identified.   Assess pain on admission, during daily assessment and/or before any "as needed" intervention(s)   Reassess pain within 30-60 minutes of any procedure/intervention, per Pain Assessment, Intervention, Reassessment (AIR) Cycle   Evaluate if patient comfort function goal is met   Offer non-pharmacological pain management interventions   Evaluate patient's satisfaction with pain management progress   Include patient/patient care companion in decisions related to pain management as needed     Problem: Diabetes: Glucose Imbalance  Goal: Blood glucose stable at established goal  Outcome: Progressing  Flowsheets (Taken 08/12/2020 0743)  Blood glucose stable at established goal:   Monitor lab values   Monitor intake and output.  Notify LIP if urine output is < 30 mL/hour.   Follow fluid restrictions/IV/PO parameters   Include patient/family in decisions related to nutrition/dietary selections   Coordinate medication administration with meals, as indicated   Ensure patient/family has adequate teaching materials   Ensure appropriate diet and assess tolerance   Assess for  hypoglycemia /hyperglycemia   Monitor/assess vital signs   Ensure adequate hydration     Problem: Hemodynamic Status: Cardiac  Goal: Stable vital signs and fluid balance  Outcome: Progressing  Flowsheets (Taken 08/12/2020 0743)  Stable vital signs and fluid balance:   Monitor/assess vital signs and telemetry per unit protocol   Monitor intake/output per unit protocol and/or LIP order   Weigh on admission and record weight daily   Assess signs and symptoms associated with cardiac rhythm changes   Monitor lab values   Monitor for leg swelling/edema and report to LIP if abnormal

## 2020-08-12 NOTE — Treatment Plan (Cosign Needed)
Admit to ICU for DKA mgmt. On Insulin drip and IVF.      Activity   [x]  Y / []  N  PMP Activity: Step 6 - Walks in Room (08/12/2020  6:00 AM)      Shift target level:    PT/OT Consult: []  Y / [x]  N   Mobility level:   PMP Contraindicated  Step 1 - Bedrest, Step 2 - Supine Exercise, Step 3- Bed Mobility, Step 4 - Dangle at Bedside,  Step 5 - Chair, Step 6 - Walks in Room,   Step - 7 Walks out of room   Feeding/Fluids     []  Y    /   []  N  Supervise For Meals Frequency: All meals  Supervise For Meals Frequency: All meals  Diet consistent carbohydrate     Intake/Output Summary (Last 24 hours) at 08/12/2020 0749  Last data filed at 08/12/2020 0700  Gross per 24 hour   Intake 3047.6 ml   Output 700 ml   Net 2347.6 ml       Diet ordered?  NPO?  Tube feedings initiated?  What kind? What rate?  Can they be advanced?  Need free water?  What IV fluids are running?  Is the I&O unbalanced? Mean arterial pressure >65? If patient receiving any vassopresor equivalent dose of less than 12.5 mcg/min  (Malnutrition leads to worse outcomes)       Analgesia  Not use for sedation     [x]  Y    /    []  N    /   []  NA    Pain/CPOT Score:   Pain Score: 4-moderate pain (Pain)    (CPOT) Is there adequate pain relief ordered?  Is there a pain plan of care? Can opioids be reduced, and/or replaced with non-opioid options?   (Pain affects psychological and physiological recovery)   Sedation/  Paralytic?   []  Y    /    []  N     /   [x]  NA  []  Y    /    []  N     /   [x]  NA    Target RASS Score:  Patient Actual RASS Score:   RASS Score: Alert and Calm    CAM-ICU: Delirium Present? []  Y    /    [x]  N  IF CAM Positive - use the acronym THINK  Positive or Negative for Delirium?: Negative  SAT Performed: []  Y    /    []  N  /   [x]  NA   Are daily sedation vacations performed?  Is RASS 0 to -2 being maintained ("calm, comfortable, collaborative")?  (Over sedation is correlated with harmful effects)  THINK - Toxic situations and medications, Hypoxemia,  Infection/Sepsis, inflammation, immobilization, Non-pharmacological interventions, K+ or other electrolyte interventions     Thromboprophylaxis   On eliquis for hx PE.  []  Y    /    [x]  N    Last Anticoagulant Admin          No anticoagulants administered       Is lovenox or heparin in use to avoid DVT?  Are the coags abnormal?  Is anticoagulation contraindicated?  Why?  (DVT leads to increased mortality and morbidity)         HAPI Prevention     []  Y    /   []   N    Braden score:   Braden Scale Score: 19 Braden score? Does the  patient need a specialty bed?  Are prevention methods in place? Are skin checks being done under devices?  Are pictures taken of existing HAPIs?  (Prevention is the goal; HAPIs increase LOS and costs)     Ulcer Prevention [x]  Y    /  []  N  If indicated    protonix *ONLY INDICATED IF coagulopathy, mechanical ventilation for >48 hours, traumatic brain injury, history of GI ulceration or bleeding within past year, extensive burns   Glucose Control     [x]  Y    /  []   N    Insulin gtt  Recent Labs     08/12/20  0658 08/12/20  0557 08/12/20  0512 08/12/20  0426 08/12/20  0347 08/12/20  0318 08/12/20  0106 08/12/20  0015 08/11/20  2056 08/11/20  2048 08/11/20  2022 08/11/20  1608   Glucose  --   --   --   --  263*  --   --  304*  --  593*  --  651*   Whole Blood Glucose POCT 178* 219* 212* 226*  --    < >   < >  --    < >  --    < >  --     < > = values in this interval not displayed.      Are blood sugars under control (<180)?  Need insulin? Need long acting insulin if only sliding scale is ordered?  (Controlled glucose leads to shorter ICU stays) Any medications that can be adjusted? i.e. Steroids?    Spontaneous     Breathing Trial []  Y    /   []  N    /   [x]  NA    Did the patient pass the SAT today? Are they ready for CPAP? Is their sedation at a minimum?  Current vent settings? Vital signs and hemodynamics?  (Delayed weaning leads to complications, VAP, increased LOS)     Respiratory Status O2  Device:   O2 Device: CPAP  Current Settings:     If COVID +, patient able to self-prone: []  Y /  []  N    Incentive Spirometer Volume:        Bowel Care   [x]  Y    /   []  N    Pericolace.  Bowel sounds? Passing flatus, BMs?  Distention present?  Need C.Diff culture?  Laxative? Vomiting?   (Diarrhea leads to electrolyte imbalance, dehydration, skin breakdown, delirium;  Constipation leads to discomfort, feeding intolerance, delirium)   Indwelling Catheter    Removal  Foley?  Central Line? []  Y    /  []  N     /   [x]  NA                 Vasopressors?  Can Foley be removed? Can we use Purewick or condom cath?    Can Central Line be removed?  Can Arterial Line be removed?  Do we need a PICC?  (Longer dwell time increase risk of infection, discomfort, and reduces mobility)     De-escalation of      Antibiotics   []  Y    /   []  N     /   [x]  NA What antibiotics are in use?  What are we treating? What culture/sensitivity results are available?   (Unnecessary antibiotic use promotes development of resistance   Miscellaneous Restraints []  Y / []  N  Palliative Care []  Y / []  N  Transfer or Discharge: []  Y / []  N  WRTC Trigger: []  Y / []  N  Family Conference: []  Y / []  N      Core Measures AMI:  [] ASA [] Beta-Blocker  CHF: [] EF% [] ACE or ARB  Stroke: [] PT/OT [] Swallow Screen   PNA: [] Immunization [] Bld culture w/in 24 hrs        Goals for today Insulin gtt off   Transfer      Brief summary of patient goals that everyone agreed upon.    Participants Physician: []  Y / []  N  Nursing:[]  Y / []  N  Pharmacy: []  Y / []  N  Nutrition: []  Y / []  N  Case Management: []  Y / []  N  Respiratory: []  Y / []  N  Ethics:[]  Y / []  N  PT/OT: []  Y / []  N

## 2020-08-12 NOTE — Progress Notes (Addendum)
Admit from [facility] for DKA mgmt with insulin drip and IVF. A+Ox4, RA, SR on monitor. CHG bath done. Education given of POC to patient and father over phone.           FOUR EYES SKIN ASSESSMENT NOTE    Caylie Sandquist  June 25, 1981  [id number]    Braden Scale Score: 19    POC Initiated for Risk for Altered Skin Yes    Patient Assessed for Correct Mattress Surface Yes  *At risk patients with Braden Score less than 12 must be considered for specialty bed    Mepilex or Adhesive Foam Dressing applied to sacrum/heel if any PI risk factors present: Yes    If Wound/Pressure Injury present:    Wound/PI assessment documented in EHR: No      Admitting physician notified: No      Wound consult ordered: No      Hiram Gash, RN  August 12, 2020  7:51 AM    Second RN/PCT Name: Darlyn Read

## 2020-08-13 ENCOUNTER — Inpatient Hospital Stay: Payer: No Typology Code available for payment source

## 2020-08-13 LAB — CBC
Absolute NRBC: 0 10*3/uL (ref 0.00–0.00)
Hematocrit: 28.7 % — ABNORMAL LOW (ref 34.7–43.7)
Hgb: 8.7 g/dL — ABNORMAL LOW (ref 11.4–14.8)
MCH: 25.8 pg (ref 25.1–33.5)
MCHC: 30.3 g/dL — ABNORMAL LOW (ref 31.5–35.8)
MCV: 85.2 fL (ref 78.0–96.0)
MPV: 10.7 fL (ref 8.9–12.5)
Nucleated RBC: 0 /100 WBC (ref 0.0–0.0)
Platelets: 313 10*3/uL (ref 142–346)
RBC: 3.37 10*6/uL — ABNORMAL LOW (ref 3.90–5.10)
RDW: 12 % (ref 11–15)
WBC: 12.07 10*3/uL — ABNORMAL HIGH (ref 3.10–9.50)

## 2020-08-13 LAB — BASIC METABOLIC PANEL
Anion Gap: 7 (ref 5.0–15.0)
BUN: 68 mg/dL — ABNORMAL HIGH (ref 7.0–19.0)
CO2: 22 mEq/L (ref 22–29)
Calcium: 8.5 mg/dL (ref 8.5–10.5)
Chloride: 106 mEq/L (ref 100–111)
Creatinine: 1.7 mg/dL — ABNORMAL HIGH (ref 0.6–1.0)
Glucose: 245 mg/dL — ABNORMAL HIGH (ref 70–100)
Potassium: 4.4 mEq/L (ref 3.5–5.1)
Sodium: 135 mEq/L — ABNORMAL LOW (ref 136–145)

## 2020-08-13 LAB — GLUCOSE WHOLE BLOOD - POCT
Whole Blood Glucose POCT: 356 mg/dL — ABNORMAL HIGH (ref 70–100)
Whole Blood Glucose POCT: 211 mg/dL — ABNORMAL HIGH (ref 70–100)
Whole Blood Glucose POCT: 164 mg/dL — ABNORMAL HIGH (ref 70–100)
Whole Blood Glucose POCT: 237 mg/dL — ABNORMAL HIGH (ref 70–100)
Whole Blood Glucose POCT: 224 mg/dL — ABNORMAL HIGH (ref 70–100)

## 2020-08-13 LAB — MAGNESIUM: Magnesium: 2.1 mg/dL (ref 1.6–2.6)

## 2020-08-13 LAB — PHOSPHORUS: Phosphorus: 3.6 mg/dL (ref 2.3–4.7)

## 2020-08-13 LAB — GFR: EGFR: 33.1

## 2020-08-13 LAB — HEMOLYSIS INDEX: Hemolysis Index: 3 (ref 0–18)

## 2020-08-13 MED ORDER — LEVEMIR 100 UNIT/ML SC SOLN
50.0000 [IU] | Freq: Two times a day (BID) | SUBCUTANEOUS | 1 refills | Status: DC
Start: 2020-08-13 — End: 2023-04-03

## 2020-08-13 MED ORDER — INSULIN LISPRO 100 UNIT/ML SC SOLN
1.0000 [IU] | Freq: Every evening | SUBCUTANEOUS | Status: DC
Start: 2020-08-13 — End: 2020-08-13

## 2020-08-13 MED ORDER — BACLOFEN 10 MG PO TABS
10.0000 mg | ORAL_TABLET | Freq: Four times a day (QID) | ORAL | Status: DC | PRN
Start: 2020-08-13 — End: 2020-08-13
  Administered 2020-08-13: 12:00:00 10 mg via ORAL
  Filled 2020-08-13: qty 1

## 2020-08-13 MED ORDER — INSULIN SYRINGE-NEEDLE U-100 27G X 1/2" 1 ML MISC
1.0000 | Freq: Four times a day (QID) | 3 refills | Status: AC
Start: 2020-08-13 — End: ?

## 2020-08-13 MED ORDER — INSULIN LISPRO 100 UNIT/ML SC SOLN
20.0000 [IU] | Freq: Three times a day (TID) | SUBCUTANEOUS | Status: DC
Start: 2020-08-13 — End: 2020-08-13

## 2020-08-13 MED ORDER — BUMETANIDE 2 MG PO TABS
2.0000 mg | ORAL_TABLET | Freq: Every day | ORAL | Status: DC
Start: 2020-08-13 — End: 2020-08-21

## 2020-08-13 MED ORDER — LIRAGLUTIDE 18 MG/3ML SC SOPN
0.6000 mg | PEN_INJECTOR | Freq: Every day | SUBCUTANEOUS | 2 refills | Status: DC
Start: 2020-08-13 — End: 2021-11-14

## 2020-08-13 MED ORDER — INSULIN DETEMIR 100 UNIT/ML SC SOPN
50.0000 [IU] | PEN_INJECTOR | Freq: Two times a day (BID) | SUBCUTANEOUS | 3 refills | Status: DC
Start: 2020-08-13 — End: 2020-08-13

## 2020-08-13 MED ORDER — ALCOHOL SWABS PADS
MEDICATED_PAD | 3 refills | Status: DC
Start: 2020-08-13 — End: 2023-04-03

## 2020-08-13 MED ORDER — INSULIN LISPRO 100 UNIT/ML SC SOLN
25.0000 [IU] | Freq: Three times a day (TID) | SUBCUTANEOUS | Status: DC
Start: 2020-08-13 — End: 2020-08-13
  Administered 2020-08-13: 10:00:00 25 [IU] via SUBCUTANEOUS
  Filled 2020-08-13: qty 75

## 2020-08-13 MED ORDER — INSULIN LISPRO 100 UNIT/ML SC SOLN
2.0000 [IU] | Freq: Three times a day (TID) | SUBCUTANEOUS | Status: DC
Start: 2020-08-13 — End: 2020-08-13
  Administered 2020-08-13 (×2): 4 [IU] via SUBCUTANEOUS
  Filled 2020-08-13 (×2): qty 12

## 2020-08-13 MED ORDER — INSULIN LISPRO 100 UNIT/ML SC SOLN
25.0000 [IU] | Freq: Three times a day (TID) | SUBCUTANEOUS | Status: DC
Start: 2020-08-13 — End: 2020-08-13
  Administered 2020-08-13 (×2): 25 [IU] via SUBCUTANEOUS
  Filled 2020-08-13: qty 75

## 2020-08-13 MED ORDER — INSULIN LISPRO 100 UNIT/ML SC SOLN
25.0000 [IU] | Freq: Three times a day (TID) | SUBCUTANEOUS | 3 refills | Status: DC
Start: 2020-08-13 — End: 2023-04-03

## 2020-08-13 NOTE — Discharge Summary (Signed)
Discharge Summary    Date:08/13/2020   Patient Name: [name]  Attending Physician: Anola Gurney, MD    Date of Admission:   08/10/2020    Date of Discharge:   08/13/2020    Admitting Diagnosis:   Severe hyperglycemia causing mild HHS  Acute asthma exacerbation    Discharge Dx:     Principal Diagnosis (Diagnosis after study, that is chiefly responsible for admission to inpatient status): AKI (acute kidney injury)    Hyperosmolar hyperglycemic state, resolved  AKI, resolved.  CKD III  DM with severe hyperglycemia causing above.    Steroid induced hyperglycemia causing above.    Acute asthma exacerbation, resolved.   Mild intermittent asthma.    Pseudohyponatremia, resolved.   Chronic heart failure with preserved ejection fraction/diastolic dysfunction, well compensated.    Essential HTN.  Class III obesity.   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    History of PE.    Elevated AGMA, resolved.      Treatment Team:   Treatment Team:   Attending Provider: Anola Gurney, MD  Consulting Physician: Lattie Haw, MD     Procedures performed:   Radiology: all results from this admission  XR Chest PA Or AP    Result Date: 08/10/2020    No acute cardiopulmonary process. Wyatt Portela, MD  08/10/2020 9:40 PM    US Renal Kidney    Result Date: 08/11/2020    Unremarkable appearance of the kidneys and bladder. Wyatt Portela, MD  08/11/2020 8:07 PM    XR Chest AP Portable    Result Date: 08/13/2020   No active disease is seen in the chest. Laurena Slimmer, MD  08/13/2020 7:45 AM      Reason for Admission:   Acute asthma exacerbation.    Hospital Course:     Admitted for acute asthma exacerbation.  Received steroids.  Resulted in severe hyperglycemia causing hyperosmotic hyperglycemic state.  Required ICU admission for insulin drip.  Improved control.  Requires significant amount of insulin: glargine 50 units BID, humalog 25 units TID.  To continue titrating insulin as outpatient and follow up with endocrinology.  Discharged in  improved condition.      Condition at Discharge:   Improved  Today:     BP 113/69   Pulse 65   Temp 97.9 F (36.6 C) (Axillary)   Resp (!) 11   Ht 1.651 m (5\' 5" )   Wt 134.6 kg (296 lb 11.8 oz)   SpO2 98%   BMI 49.38 kg/m   Ranges for the last 24 hours:  Temp:  [97.8 F (36.6 C)-98.1 F (36.7 C)] 97.9 F (36.6 C)  Heart Rate:  [63-80] 65  Resp Rate:  [9-28] 11  BP: (90-152)/(54-81) 113/69    Last set of labs       Micro / Labs / Path pending:     Unresulted Labs     None          Discharge Instructions For Providers     1. Avoid systemic steroids  2. Continue insulin and follow up with endocrinology             Discharge Instructions:     Follow-up Information     Pcp, None, MD .                 Discharge Diet: Consistent carbs          Disposition:  home     Discharge Medication List  Taking    Alcohol Swabs Pads  Use as indicated for insulin injections     apixaban 5 MG  Dose: 5 mg  Commonly known as: ELIQUIS  For: Blockage of Blood Vessel to Lung by a Particle  Take 5 mg by mouth every 12 (twelve) hours     atorvastatin 20 MG tablet  Dose: 20 mg  Commonly known as: LIPITOR  Take 20 mg by mouth nightly     baclofen 10 MG tablet  Dose: 10 mg  Commonly known as: LIORESAL  Take 10 mg by mouth as needed     bumetanide 2 MG tablet  Dose: 2 mg  What changed: when to take this  Commonly known as: BUMEX  Take 1 tablet (2 mg total) by mouth daily     carvedilol 25 MG tablet  Dose: 25 mg  Commonly known as: COREG  Take 25 mg by mouth 2 (two) times daily     gabapentin 600 MG tablet  Dose: 600 mg  Commonly known as: NEURONTIN  Take 600 mg by mouth 3 (three) times daily     hydrALAZINE 50 MG tablet  Dose: 50 mg  Commonly known as: APRESOLINE  Take 50 mg by mouth 2 (two) times daily     hydrOXYzine 10 MG tablet  Dose: 10 mg  Commonly known as: ATARAX  Take 10 mg by mouth every 6 (six) hours as needed for Anxiety     insulin lispro 100 UNIT/ML injection  Dose: 25 Units  Commonly known as: HumaLOG  Inject 25  Units into the skin 3 (three) times daily before meals     Insulin Syringe-Needle U-100 27G X 1/2" 1 ML Misc  Dose: 1 Dose  Inject 1 Dose into the skin 4 (four) times daily     Levemir 100 UNIT/ML injection  Dose: 50 Units  Generic drug: insulin detemir  Inject 50 Units into the skin every 12 (twelve) hours     liraglutide 18 MG/3ML injection  Dose: 0.6 mg  Commonly known as: VICTOZA  Inject 0.6 mg into the skin daily     omeprazole 40 MG capsule  Dose: 40 mg  Commonly known as: PriLOSEC  Take 40 mg by mouth nightly        STOP taking these medications    metOLazone 10 MG tablet  Commonly known as: ZAROXOLYN          Minutes spent coordinating discharge and reviewing discharge plan: 35 minutes      Signed by: Anola Gurney, MD

## 2020-08-13 NOTE — Nursing Progress Note (Signed)
FOUR EYES SKIN ASSESSMENT NOTE    [name]  04-09-1981  [id number]    Braden Scale Score: 19    POC Initiated for Risk for Altered Skin Yes    Patient Assessed for Correct Mattress Surface Yes  *At risk patients with Braden Score less than 12 must be considered for specialty bed    Mepilex or Adhesive Foam Dressing applied to sacrum/heel if any PI risk factors present: Yes    If Wound/Pressure Injury present:    Wound/PI assessment documented in EHR: No      Admitting physician notified: No      Wound consult ordered: No      Marinell Blight, RN  August 13, 2020  6:21 AM    Second RN/PCT Name:

## 2020-08-13 NOTE — Progress Notes (Signed)
Patient discharged to home. No other needs recommended by OT. Will follow up with primary care physician Dr Rigoberto Noel. Nurse called out patient endocrinologist for patient in regards to wanting insulin pump.   08/13/20 1535   Discharge Disposition   Patient preference/choice provided? Yes   Physical Discharge Disposition Home  (Patient living in [place] currently at address on file)   Receiving facility, unit and room number: na   Nursing report phone number: na   Facility fax number: na   Mode of Transportation Car   Patient/Family/POA notified of transfer plan Patient informed only   Patient agreeable to discharge plan/expected d/c date? Yes   Bedside nurse notified of transport plan? Yes   Special requirements for patient during transport: Other (comment)  (none)   CM Interventions   Follow up appointment scheduled? No  (Patient self scheduled to see primary care doctor/endocrinologst)   Reason no follow up scheduled? Other (comment)  (Patient self scheduled. Also provided information on transition clinic if she changes her mind and wants someone closer to home.)   Referral made for home health RN visit? Does not meet home bound criteria  (Patient drives self and works.)   Medicare Checklist   Is this a Medicare patient? No     Kae Heller, MHA, [place], [place]  Case Manager I  [facility]

## 2020-08-13 NOTE — Plan of Care (Signed)
Please refer to the most recent OT eval or progress note for up-to-date d/c and DME recommendations as they are subject to change with patient status.    Is PT evaluation indicated at this time? No, an acute care PT evaluation is not required at this time.    PMP Activity: Step 7 - Walks out of Room  Distance Walked (ft) (Step 6,7): 160 Feet  (Please See Therapy Evaluation for device and assistance level needed)    Treatment Interventions: No skilled interventions needed at this time        Beverly Gust, OTR/L    Physical Medicine and Rehabilitation  [facility]  [phone] (PRN # changes daily)    08/13/2020  3:26 PM

## 2020-08-13 NOTE — Discharge Instr - AVS First Page (Addendum)
Instructions for after your discharge:  Please start taking insulin in the following way:   -  Levemir/Detemir 50 units subcutanoues injection twice a day   -  Humalog/Lispro 25 units three times a day before meals  -  Start victoza/liraglutide 0.6 mg subcutaneous injection daily    -  Do not take your pre-meal insulin if your are going to miss a meal    Closely monitor your blood sugar four times a day: Morning, before lunch, before dinner and at bedtime  -  If any readings less than 70, please decrease your premeal insulin to 10 units and decrease your levemir to 25 units twice a day    Follow up with your endocrinologist as soon as possible for ongoing management of your diabetes.

## 2020-08-13 NOTE — Progress Notes (Signed)
Pt d/c home. Discharge education done. Belongings all confirmed w/ pt

## 2020-08-13 NOTE — UM Notes (Signed)
Requesting final auth to be faxed to [phone] and additional information can be requested by calling   [phone]    Auth Number :  [id number]    Discharge Date -  08/13/2020  5:58 PM    ER ADMIT DATE AND TIME: 08/10/2020  9:27 PM  OBS admit:08/11/20 0006  IP admit: 08/11/20 1012    NAME: [name]             MR#: [id number]      Patient Address:  [address]  [place] [address]    Patient phone: [phone] (home)     PATIENT NAME: [name], [name]  DOB: Mar 19, 1981   PMH:  has a past medical history of Arthritis, Asthma, Chronic kidney disease, Congestive heart failure, Diabetes mellitus, Fibromyalgia, Hyperlipidemia, Hypertension, Neuropathy, Pulmonary embolism (08/2019), and Vertigo.  PSH:  has a past surgical history that includes Hysterectomy (12/2019).    BP 158/79   Pulse 78   Temp 98.5 F (36.9 C) (Oral)   Resp (!) 11   Ht 1.651 m (5\' 5" )   Wt 134.6 kg (296 lb 11.8 oz)   SpO2 98%   BMI 49.38 kg/m     Temp:  [97.8 F (36.6 C)-98.5 F (36.9 C)]   Heart Rate:  [63-80]   Resp Rate:  [9-28]   BP: (90-158)/(54-81)   SpO2:  [95 %-100 %]       DIAGNOSIS:     ICD-10-CM    1. AKI (acute kidney injury)  N17.9    2. Exacerbation of asthma, unspecified asthma severity, unspecified whether persistent  J45.901    3. Hyperglycemia  R73.9      MD 08/13/2020  Assessment:  .  Steroid-induced hyperglycemia, resolved  .  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  Acute on chronic kidney disease  .  Hyponatremia  .  Anion gap metabolic acidosis, resolved  .  Pulmonary embolism  .  Diastolic heart failure  .  Hypertension  .  Anemia  .  Acute blood loss anemia  .  Leukocytosis  .  Asthma  .  Hyperlipidemia  .  Obstructive sleep apnea  Plan:  Has been transferred to medical service   .  Steroid induced hyperglycemia, continue Lantus, prandial and correctional insulin.    Marland Kitchen  Uncontrolled diabetes mellitus,Patient requesting insulin pump, defer to medicine on discharge.  .  Acute on chronic kidney  disease, improved renal function, strict I's and O's.  No hydro on renal ultrasound  .  Pulmonary embolism, continue Eliquis  .  Hypertension, on Coreg and hydralazine  .  Anemia, no overt signs of bleeding, monitor H&H and transfuse as needed  .  Asthma, continue duo nebs  .  Hyperlipidemia, on Lipitor  .  OSA, continue nocturnal CPAP, recommend outpatient sleep study      UTILIZATION REVIEW CONTACT: Eddie North, MSN, RN, ACM  Utilization Review Case Manager II /Case Management  [facility]  [address]  [place], [place] [address]  T [phone] Judie Petit [phone]  F [phone]   Email: [email or web address]   NPI:  [id number] Tax ID:  [id number]

## 2020-08-13 NOTE — Progress Notes (Signed)
Nutritional Support Services  Nutrition Education    Referral Source: Verbal Consult  Reason for Referral: Diabetic diet education    Who was educated: Patient    Readiness to learn:  Refuses    Education Details: Patient reports having diabetes since she adolescence, has had plenty of diet education in the past and is not interested in further education at this time.     Type of Instruction Provided: none    Level of Understanding: Refused teaching    Additional resources attached to AVS    Will follow up within 10 days and PRN    Baxter Hire, RD, CNSC  Clinical Dietitian  [phone]

## 2020-08-13 NOTE — Progress Notes (Signed)
Situation: IDPA completed with Patient at bedside.     Background:Admitted to [facility] for chest pain, emesis, and SOB. Patient is diabetic and also presented with hyperglycemia. Patient also has a history of OSA, PE, and is on Eliquis.     Assessment: Patient recently moved to [place] from [place] and is switching doctors over. Her current physician is in [place] Dr. Candise Che, [place]. Provided patient with transitional care office information since it would be closer to her home. Patient stated she lives in a hotel currently and had a sleep study completed in [place] prior to coming to [place]. She stated she is in need of a CPAP machine. She was also hoping to transition to a glucose monitor and pump.     Recommendation: Home with daughter. Patient drove car to hospital and states she would like to drive home if cleared to do so. Waiting for PT/OT recommendation to see if [facility] is necessary. Patient will need to be home bound in order to qualify.    08/13/20 1450   Patient Type   Within 30 Days of Previous Admission? No   Healthcare Decisions   Interviewed: Patient   Orientation/Decision Making Abilities of Patient Alert and Oriented x3, able to make decisions   Advance Directive Patient does not have advance directive   Prior to admission   Prior level of function Independent with ADLs;Ambulates independently   Type of Residence Other (Comment)  [facility] currently.)   Home Layout One level;Elevator   Have running water, electricity, heat, etc? Yes   Living Arrangements Alone   How do you get to your MD appointments? Self   How do you get your groceries? Self   Who fixes your meals? Self   Who does your laundry? Self   Who picks up your prescriptions? Self   DME Currently at [facility] Care/Community Services None   Prior SNF admission? (Detail) No   Prior Rehab admission? (Detail) No   Consults/Providers   PT Evaluation Needed 1   OT Evalulation Needed 1   SLP Evaluation Needed 2   Family and PCP   PCP on  file was verified as the current PCP? Yes   In case you are admitted, would like family notified? Yes   Name of family member to be notified Daughter Lemont Fillers [phone]   In case you are admitted, would like your PCP notified? Yes   Important Message from [facility] Notice   Patient received 1st IMM Letter? n/a

## 2020-08-13 NOTE — Nursing Progress Note (Signed)
Brief summary of why patient was admitted and any issues in the past 24 hours (1 min max). After the brief summary, then proceed to A FASTHUGS BID.   Activity   [x]  Y / []  N  PMP Activity: Step 6 - Walks in Room (08/13/2020  6:00 AM)      Shift target level:    PT/OT Consult: [x]  Y / []  N   Mobility level:   PMP Contraindicated  Step 1 - Bedrest, Step 2 - Supine Exercise, Step 3- Bed Mobility, Step 4 - Dangle at Bedside,  Step 5 - Chair, Step 6 - Walks in Room,   Step - 7 Walks out of room   Feeding/Fluids     [x]  Y    /   []  N  Supervise For Meals Frequency: All meals  Supervise For Meals Frequency: All meals  Diet consistent carbohydrate  Supervise For Meals Frequency: All meals     Intake/Output Summary (Last 24 hours) at 08/13/2020 5284  Last data filed at 08/13/2020 0600  Gross per 24 hour   Intake 3216 ml   Output 3000 ml   Net 216 ml       Diet ordered?  NPO?  Tube feedings initiated?  What kind? What rate?  Can they be advanced?  Need free water?  What IV fluids are running?  Is the I&O unbalanced? Mean arterial pressure >65? If patient receiving any vassopresor equivalent dose of less than 12.5 mcg/min  (Malnutrition leads to worse outcomes)       Analgesia  Not use for sedation     []  Y    /    [x]  N    /   []  NA    Pain/CPOT Score:   Pain Score: 0-No pain (Pain)    (CPOT) Is there adequate pain relief ordered?  Is there a pain plan of care? Can opioids be reduced, and/or replaced with non-opioid options?   (Pain affects psychological and physiological recovery)   Sedation/  Paralytic?   []  Y    /    [x]  N     /   []  NA  []  Y    /    []  N     /   []  NA    Target RASS Score:  Patient Actual RASS Score:   RASS Score: Alert and Calm    CAM-ICU: Delirium Present? []  Y    /    []  N  IF CAM Positive - use the acronym THINK  Positive or Negative for Delirium?: Negative  SAT Performed: []  Y    /    []  N  /   []  NA   Are daily sedation vacations performed?  Is RASS 0 to -2 being maintained ("calm, comfortable,  collaborative")?  (Over sedation is correlated with harmful effects)  THINK - Toxic situations and medications, Hypoxemia, Infection/Sepsis, inflammation, immobilization, Non-pharmacological interventions, K+ or other electrolyte interventions     Thromboprophylaxis   [x]  Y    /    []  N    Last Anticoagulant Admin          No anticoagulants administered      SCD on  Is lovenox or heparin in use to avoid DVT?  Are the coags abnormal?  Is anticoagulation contraindicated?  Why?  (DVT leads to increased mortality and morbidity)         HAPI Prevention     [x]  Y    /   []   N    Braden score:   Braden Scale Score: 19 Braden score? Does the patient need a specialty bed?  Are prevention methods in place? Are skin checks being done under devices?  Are pictures taken of existing HAPIs?  (Prevention is the goal; HAPIs increase LOS and costs)     Ulcer Prevention [x]  Y    /  []  N  If indicated *ONLY INDICATED IF coagulopathy, mechanical ventilation for >48 hours, traumatic brain injury, history of GI ulceration or bleeding within past year, extensive burns   Glucose Control     [x]  Y    /  []   N  Recent Labs     08/13/20  0437 08/13/20  0436 08/12/20  2343 08/12/20  2007 08/12/20  1707 08/12/20  1525 08/12/20  1156 08/12/20  0801 08/12/20  0746 08/12/20  0426 08/12/20  0347   Glucose  --  245*  --   --   --   --  266*  --  214*  --  263*   Whole Blood Glucose POCT 211*  --  329* 366* 406*   < >  --    < >  --    < >  --     < > = values in this interval not displayed.      Are blood sugars under control (<180)?  Need insulin? Need long acting insulin if only sliding scale is ordered?  (Controlled glucose leads to shorter ICU stays) Any medications that can be adjusted? i.e. Steroids?    Spontaneous     Breathing Trial []  Y    /   [x]  N    /   []  NA    Did the patient pass the SAT today? Are they ready for CPAP? Is their sedation at a minimum?  Current vent settings? Vital signs and hemodynamics?  (Delayed weaning leads to  complications, VAP, increased LOS)     Respiratory Status O2 Device:   O2 Device: CPAP  Current Settings:     If COVID +, patient able to self-prone: []  Y /  []  N    Incentive Spirometer Volume:        Bowel Care   [x]  Y    /   []  N Bowel sounds? Passing flatus, BMs?  Distention present?  Need C.Diff culture?  Laxative? Vomiting?   (Diarrhea leads to electrolyte imbalance, dehydration, skin breakdown, delirium;  Constipation leads to discomfort, feeding intolerance, delirium)   Indwelling Catheter    Removal  Foley?  Central Line? []  Y    /  [x]  N     /   []  NA                 Vasopressors?  Can Foley be removed? Can we use Purewick or condom cath?    Can Central Line be removed?  Can Arterial Line be removed?  Do we need a PICC?  (Longer dwell time increase risk of infection, discomfort, and reduces mobility)     De-escalation of      Antibiotics   []  Y    /   [x]  N     /   []  NA What antibiotics are in use?  What are we treating? What culture/sensitivity results are available?   (Unnecessary antibiotic use promotes development of resistance   Miscellaneous Restraints []  Y / [x]  N  Palliative Care []  Y / [x]  N  Transfer or Discharge: []  Y / [x]  N  Marshall Trigger: []  Y / [x]  N  Family Conference: []  Y / [x]  N      Core Measures AMI:  [] ASA [] Beta-Blocker  CHF: [] EF% [] ACE or ARB  Stroke: [] PT/OT [] Swallow Screen   PNA: [] Immunization [] Bld culture w/in 24 hrs        Goals for today      Brief summary of patient goals that everyone agreed upon.    Participants Physician: []  Y / []  N  Nursing:[]  Y / []  N  Pharmacy: []  Y / []  N  Nutrition: []  Y / []  N  Case Management: []  Y / []  N  Respiratory: []  Y / []  N  Ethics:[]  Y / []  N  PT/OT: []  Y / []  N

## 2020-08-13 NOTE — OT Eval Note (Signed)
[facility]  Occupational Therapy Evaluation    Patient: [name]  MRN#: [id number]  Unit: MED SURG ICU 1  Bed: A2916/A2916-01    Time of Evaluation:  Time Calculation  OT Received On: 08/13/20  Start Time: 1459  Stop Time: 1517  Time Calculation (min): 18 min    Chart Review and Collaboration with Care Team: 5 minutes, not included in above time    OT Visit Number: 1    Consult received for [name] for OT Evaluation and Treatment.  Patient's medical condition is appropriate for Occupational therapy intervention at this time.    ___________________________________________________    POST ACUTE CARE THERAPY RECOMMENDATIONS:   Discharge Recommendation: Home with no needs       DME Recommended for Discharge: Shower chair    Therapy recommendations are subject to change with patient status.  Please refer to the most recent OT note for up-to-date recommendations.  ___________________________________________________      Activity Orders:  OT eval and treat and progressive mobility protocol    Precautions and Contraindications:   Precautions  Weight Bearing Status: no restrictions  Other Precautions: MRSA, contact    Personal Protective Equipment (PPE)  gloves, procedure gown, procedure mask, disposable N95 and eye shield/covering    Medical Diagnosis: AKI (acute kidney injury) [N17.9]  Drug-induced hyperglycemia [R73.9, T50.905A]    History of Present Illness:  [name] is a 39 y.o. female admitted on 08/10/2020 with female with medical history of asthma, D- CHF, PE on Eliquis, blood loss anemia secondary to vaginal bleed due to heparin drip started for PE resulted in 6 units PRBCs, uterine angiography/embolization,  hypertension hyperlipidemia, diabetes, morbid obesity and GERD who presented with multiple complaints.Patient states that she moved from [place], was on oral hypoglycemic (unknown name) plus insulin in [place] with good glycemic control, changed to Victoza, Metformin and insulin due  to insurance noncoverage once moved, since blood glucose level is not controlled.  She visited endocrinologist 2 days ago added Humalog, kept same dose of Lantus, increase dose up with Victoza and decrease Metformin dose.  Patient checked her Accu-Chek and found high undetectable.      Patient Active Problem List   Diagnosis   . AKI (acute kidney injury)   . Drug-induced hyperglycemia        Past Medical/Surgical History:  Past Medical History:   Diagnosis Date   . Arthritis    . Asthma    . Chronic kidney disease    . Congestive heart failure    . Diabetes mellitus    . Fibromyalgia    . Hyperlipidemia    . Hypertension    . Neuropathy    . Pulmonary embolism 08/2019   . Vertigo       Past Surgical History:   Procedure Laterality Date   . HYSTERECTOMY  12/2019         X-Rays/Tests/Labs:  Lab Results   Component Value Date/Time    HGB 8.7 (L) 08/13/2020 04:36 AM    HCT 28.7 (L) 08/13/2020 04:36 AM    K 4.4 08/13/2020 04:36 AM    NA 135 (L) 08/13/2020 04:36 AM    INR 1.2 (H) 08/11/2020 08:48 AM    TROPI <0.01 08/11/2020 08:48 AM    TROPI <0.01 08/11/2020 02:55 AM    TROPI <0.01 08/11/2020 02:55 AM    TROPI <0.01 08/10/2020 09:59 PM       All imaging reviewed, please see chart for details.    Social History:  Prior Level of Function  Prior level of function: Independent with ADLs, Ambulates independently  Baseline Activity Level: Community ambulation  Driving: independent  Cooking: Yes  Employment: FT  DME Currently at Home: Other (Comment), Walker, Knee (none)    Home Living Arrangements  Living Arrangements: Children  Type of Home: Other (Comment) (hotel)  Home Layout: One level, Occupational psychologist Shower/Tub: Pension scheme manager: Standard  DME Currently at Home: Other (Comment), Walker, Knee (none)    Subjective:   Patient is agreeable to participation in the therapy session. Nursing clears patient for therapy.      Pain Assessment  Pain Assessment: No/denies pain      Objective:        Observation of  Patient/Vital Signs: Stable    Cognitive Status and Neuro Exam:  Cognition/Neuro Status  Orientation Level: Oriented X4  Behavior: calm, cooperative  Neuro Status  Behavior: calm, cooperative       Musculoskeletal Examination  Gross ROM  Right Upper Extremity ROM: within functional limits  Left Upper Extremity ROM: within functional limits  Gross Strength  Right Upper Extremity Strength: 4/5  Left Upper Extremity Strength: 4/5       Activities of Daily Living  Self-care and Home Management  UB Dressing: Independent  LB Dressing: Independent, Don/doff R sock, sitting    Functional Mobility:  Mobility and Transfers  Sit to Stand: Independent  Functional Mobility/Ambulation: Supervision     Balance  Balance  Static Sitting Balance: Independent  Dyanamic Sitting Balance: Independent  Static Standing Balance: Independent  Dynamic Standing Balance: Contact Guard Assist    Participation and Activity Tolerance  Participation and Endurance  Participation Effort: good  Endurance: Tolerates 10 - 20 min exercise with multiple rests    Educated the patient to role of occupational therapy, plan of care, goals of therapy and safety with mobility and ADLs, energy conservation techniques with verbalized understanding.    Patient left in bedside chair with all medical equipment in place and call bell and all personal items/needs within reach (of note, pt received without alarm in place).  RN notified of session outcome.       Assessment:  [name] is a 39 y.o. female admitted 08/10/2020. Patient is at baseline and has no inpatient skilled OT needs at this time.    OT Assessment: Appears to be at baseline for ADL's;Appears to be at baseline for mobility    Rehabilitation Potential:  Prognosis: Good      Plan:  D/C OT at this time.    PMP Activity: Step 7 - Walks out of Room  Distance Walked (ft) (Step 6,7): 160 Feet      Goals:  N/A      Beverly Gust, OTR/L    Physical Medicine and Rehabilitation  [facility]  [phone] (PRN # changes daily)    08/13/2020  3:24 PM

## 2020-08-13 NOTE — Plan of Care (Signed)
Problem: Safety  Goal: Patient will be free from injury during hospitalization  Outcome: Progressing  Patient will be free from injury during hospitalization:   Assess patient's risk for falls and implement fall prevention plan of care per policy   Use appropriate transfer methods   Ensure appropriate safety devices are available at the bedside   Provide and maintain safe environment   Hourly rounding   Include patient/ family/ care giver in decisions related to safety   Assess for patients risk for elopement and implement Elopement Risk Plan per policy  Note: Patient is free from injury during hospitalization.      Problem: Pain  Goal: Pain at adequate level as identified by patient  Outcome: Progressing  Pain at adequate level as identified by patient:   Identify patient comfort function goal   Assess for risk of opioid induced respiratory depression, including snoring/sleep apnea. Alert healthcare team of risk factors identified.   Assess pain on admission, during daily assessment and/or before any "as needed" intervention(s)   Reassess pain within 30-60 minutes of any procedure/intervention, per Pain Assessment, Intervention, Reassessment (AIR) Cycle   Evaluate patient's satisfaction with pain management progress   Evaluate if patient comfort function goal is met   Offer non-pharmacological pain management interventions  Note: Patient able to identify her pain at adequate level, after taking schedule medication, and once PRN med, her pain is controlled,        Problem: Diabetes: Glucose Imbalance  Goal: Blood glucose stable at established goal  Outcome: Progressing  Blood glucose stable at established goal:   Monitor lab values   Monitor intake and output.  Notify LIP if urine output is < 30 mL/hour.   Follow fluid restrictions/IV/PO parameters   Include patient/family in decisions related to nutrition/dietary selections   Monitor/assess vital signs   Assess for hypoglycemia /hyperglycemia   Coordinate  medication administration with meals, as indicated   Ensure appropriate diet and assess tolerance   Ensure adequate hydration  Note: Patient blood glucose stable at established goal, morning blood glucose was 211, less than 300 blood glucose goal at the end of the shift.

## 2020-08-13 NOTE — Discharge Instr - Diet (Signed)
Carb Consistent Diet

## 2020-08-13 NOTE — Plan of Care (Signed)
Pt Burney'd home

## 2020-08-13 NOTE — Progress Notes (Signed)
PULM. CCM PROGRESS NOTE    Date Time: 08/13/20 3:36 PM  Patient Name: [name], [name]      Assessment:     .  Steroid-induced hyperglycemia, resolved  .  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  Acute on chronic kidney disease  .  Hyponatremia  .  Anion gap metabolic acidosis, resolved  .  Pulmonary embolism  .  Diastolic heart failure  .  Hypertension  .  Anemia  .  Acute blood loss anemia  .  Leukocytosis  .  Asthma  .  Hyperlipidemia  .  Obstructive sleep apnea    Plan:     Has been transferred to medical service     .  Steroid induced hyperglycemia, continue Lantus, prandial and correctional insulin.    Marland Kitchen  Uncontrolled diabetes mellitus,Patient requesting insulin pump, defer to medicine on discharge.  .  Acute on chronic kidney disease, improved renal function, strict I's and O's.  No hydro on renal ultrasound  .  Pulmonary embolism, continue Eliquis  .  Hypertension, on Coreg and hydralazine  .  Anemia, no overt signs of bleeding, monitor H&H and transfuse as needed  .  Asthma, continue duo nebs  .  Hyperlipidemia, on Lipitor  .  OSA, continue nocturnal CPAP, recommend outpatient sleep study      Subjective:   Patient is a 39 y.o. female who is awake, alert, and comfortable.   Denies fever, chills, chest pain, shortness of breath.    Medications:     Current Facility-Administered Medications   Medication Dose Route Frequency   . albuterol-ipratropium  3 mL Nebulization Q6H SCH   . apixaban  5 mg Oral Q12H   . atorvastatin  20 mg Oral QHS   . carvedilol  25 mg Oral Q12H SCH   . gabapentin  600 mg Oral Q8H SCH   . hydrALAZINE  50 mg Oral BID   . insulin glargine  50 Units Subcutaneous Q12H   . insulin lispro  1-6 Units Subcutaneous QHS   . insulin lispro  2-10 Units Subcutaneous TID AC   . insulin lispro  25 Units Subcutaneous TID AC   . pantoprazole  40 mg Oral QAM AC   . senna-docusate  1 tablet Oral Q12H SCH       Review of Systems:     General ROS: negative for - chills, fever or night  sweats  Ophthalmic ROS: negative for - double vision, excessive tearing, itchy eyes or photophobia  ENT ROS: negative for - headaches, nasal congestion, sore throat or vertigo  Respiratory ROS: negative for - cough, hemoptysis, orthopnea, pleuritic pain, shortness of breath, tachypnea or wheezing  Cardiovascular ROS: negative for - chest pain, edema, orthopnea, rapid heart rate  Gastrointestinal ROS: negative for - abdominal pain, blood in stools, constipation, diarrhea, heartburn or nausea/vomiting  Musculoskeletal ROS: negative for - muscle pain  Neurological ROS: negative for - confusion, dizziness, headaches, speech problems, visual changes or weakness  Dermatological ROS: negative for dry skin, pruritus and rash    Physical Exam:   BP 137/67   Pulse 78   Temp 97.9 F (36.6 C) (Axillary)   Resp (!) 11   Ht 1.651 m (5\' 5" )   Wt 134.6 kg (296 lb 11.8 oz)   SpO2 98%   BMI 49.38 kg/m     Intake and Output Summary (Last 24 hours) at Date Time    Intake/Output Summary (Last 24 hours) at 08/13/2020 1536  Last  data filed at 08/13/2020 0800  Gross per 24 hour   Intake 2400 ml   Output 1600 ml   Net 800 ml       General appearance - alert,  and in no distress  Mental status - alert, oriented to person, place, and time  Eyes - pupils equal and reactive, extraocular eye movements intact  Ears - no external ear lesions seen  Nose - no nasal discharge   Mouth - clear oral mucosa, moist   Neck - supple, no significant adenopathy  Chest - clear to auscultation, no wheezes, rales or rhonchi, symmetric air entry  Heart - normal rate, regular rhythm, normal S1, S2, no murmurs, rubs, clicks or gallops  Abdomen - soft, nontender, nondistended, no masses or organomegaly  Neurological - alert, oriented, normal speech, no focal findings or movement disorder noted  Musculoskeletal - no joint swelling or tenderness  Extremities -  no pedal edema, no clubbing or cyanosis  Skin - normal coloration, no rashes    Labs:     Recent Labs    Lab 08/13/20  0436 08/12/20  0347 08/11/20  0255 08/10/20  2159 08/10/20  2159   WBC 12.07* 17.03* 12.46*  More results in Results Review 13.09*   Hgb 8.7* 8.6* 9.6*  More results in Results Review 10.4*   Hematocrit 28.7* 27.4* 30.5*  More results in Results Review 32.9*   Platelets 313 349* 353*  More results in Results Review 404*   MCV 85.2 82.8 82.0  More results in Results Review 81.8   Neutrophils  --   --   --   --  61.8   More results in Results Review = values in this interval not displayed.     Recent Labs   Lab 08/13/20  0436 08/12/20  1156 08/12/20  0746 08/11/20  0255 08/10/20  2159   Sodium 135* 134* 135*  More results in Results Review 128*   Potassium 4.4 4.2 4.0  More results in Results Review 4.3   Chloride 106 102 102  More results in Results Review 91*   CO2 22 26 22   More results in Results Review 24   BUN 68.0* 76.0* 77.0*  More results in Results Review 82.0*   Creatinine 1.7* 1.7* 1.7*  More results in Results Review 2.2*   Glucose 245* 266* 214*  More results in Results Review 398*   Calcium 8.5 9.2 9.0  More results in Results Review 9.6   Magnesium 2.1 2.2 2.3  More results in Results Review  --    Phosphorus 3.6 4.2 3.8  More results in Results Review  --    Protein, Total  --   --   --   --  7.8   Albumin  --   --   --   --  3.6   AST (SGOT)  --   --   --   --  11   ALT  --   --   --   --  17   Alkaline Phosphatase  --   --   --   --  104   Bilirubin, Total  --   --   --   --  0.3   More results in Results Review = values in this interval not displayed.     Glucose:    Recent Labs   Lab 08/13/20  0436 08/12/20  1156 08/12/20  0746 08/12/20  0347 08/12/20  0015 08/11/20  2048 08/11/20  1608   Glucose 245* 266* 214* 263* 304* 593* 651*     Recent Labs   Lab 08/11/20  0848   PT 13.6*   PT INR 1.2*       Rads:   Radiological Procedure reviewed.  Radiology Results (24 Hour)     Procedure Component Value Units Date/Time    XR Chest AP Portable [[id number]] Collected: 08/13/20 0744    Order  Status: Completed Updated: 08/13/20 0747    Narrative:      History: received IVF during ICU, assess for pulm edema    Technique: Single Portable View      Findings:  The lungs appear clear.  There is no pneumothorax.  The heart is normal in size.    The mediastinum is within normal limits.          Impression:       No active disease is seen in the chest.    Laurena Slimmer, MD   08/13/2020 7:45 AM            Lattie Haw, MD  Pulmonary and critical care  08/13/20

## 2020-08-19 DIAGNOSIS — I509 Heart failure, unspecified: Secondary | ICD-10-CM | POA: Insufficient documentation

## 2020-08-19 DIAGNOSIS — Z86711 Personal history of pulmonary embolism: Secondary | ICD-10-CM | POA: Insufficient documentation

## 2020-08-19 DIAGNOSIS — J45909 Unspecified asthma, uncomplicated: Secondary | ICD-10-CM | POA: Insufficient documentation

## 2020-08-19 DIAGNOSIS — M797 Fibromyalgia: Secondary | ICD-10-CM | POA: Insufficient documentation

## 2020-08-19 DIAGNOSIS — E785 Hyperlipidemia, unspecified: Secondary | ICD-10-CM | POA: Insufficient documentation

## 2020-08-19 DIAGNOSIS — I130 Hypertensive heart and chronic kidney disease with heart failure and stage 1 through stage 4 chronic kidney disease, or unspecified chronic kidney disease: Secondary | ICD-10-CM | POA: Insufficient documentation

## 2020-08-19 DIAGNOSIS — Z7901 Long term (current) use of anticoagulants: Secondary | ICD-10-CM | POA: Insufficient documentation

## 2020-08-19 DIAGNOSIS — N189 Chronic kidney disease, unspecified: Secondary | ICD-10-CM | POA: Insufficient documentation

## 2020-08-19 DIAGNOSIS — Z794 Long term (current) use of insulin: Secondary | ICD-10-CM | POA: Insufficient documentation

## 2020-08-19 DIAGNOSIS — M7989 Other specified soft tissue disorders: Principal | ICD-10-CM | POA: Insufficient documentation

## 2020-08-19 DIAGNOSIS — E1122 Type 2 diabetes mellitus with diabetic chronic kidney disease: Secondary | ICD-10-CM | POA: Insufficient documentation

## 2020-08-19 DIAGNOSIS — Z20822 Contact with and (suspected) exposure to covid-19: Secondary | ICD-10-CM | POA: Insufficient documentation

## 2020-08-20 ENCOUNTER — Observation Stay
Admission: EM | Admit: 2020-08-20 | Discharge: 2020-08-20 | Disposition: A | Discharge status: Home / Self Care | Payer: No Typology Code available for payment source | Attending: Internal Medicine | Admitting: Internal Medicine

## 2020-08-20 ENCOUNTER — Emergency Department: Payer: No Typology Code available for payment source

## 2020-08-20 ENCOUNTER — Observation Stay (HOSPITAL_BASED_OUTPATIENT_CLINIC_OR_DEPARTMENT_OTHER)
Admission: EM | Admit: 2020-08-20 | Discharge: 2020-08-22 | Disposition: A | Discharge status: Home / Self Care | Payer: No Typology Code available for payment source | Source: Home / Self Care | Attending: Emergency Medicine | Admitting: Emergency Medicine

## 2020-08-20 ENCOUNTER — Observation Stay: Payer: No Typology Code available for payment source

## 2020-08-20 DIAGNOSIS — Z7982 Long term (current) use of aspirin: Secondary | ICD-10-CM | POA: Insufficient documentation

## 2020-08-20 DIAGNOSIS — Z86711 Personal history of pulmonary embolism: Secondary | ICD-10-CM | POA: Insufficient documentation

## 2020-08-20 DIAGNOSIS — I130 Hypertensive heart and chronic kidney disease with heart failure and stage 1 through stage 4 chronic kidney disease, or unspecified chronic kidney disease: Secondary | ICD-10-CM | POA: Insufficient documentation

## 2020-08-20 DIAGNOSIS — Z6841 Body Mass Index (BMI) 40.0 and over, adult: Secondary | ICD-10-CM | POA: Insufficient documentation

## 2020-08-20 DIAGNOSIS — R079 Chest pain, unspecified: Secondary | ICD-10-CM

## 2020-08-20 DIAGNOSIS — E1142 Type 2 diabetes mellitus with diabetic polyneuropathy: Secondary | ICD-10-CM | POA: Insufficient documentation

## 2020-08-20 DIAGNOSIS — E1140 Type 2 diabetes mellitus with diabetic neuropathy, unspecified: Secondary | ICD-10-CM | POA: Diagnosis present

## 2020-08-20 DIAGNOSIS — J45909 Unspecified asthma, uncomplicated: Secondary | ICD-10-CM | POA: Insufficient documentation

## 2020-08-20 DIAGNOSIS — N1832 Chronic kidney disease, stage 3b: Secondary | ICD-10-CM | POA: Insufficient documentation

## 2020-08-20 DIAGNOSIS — E785 Hyperlipidemia, unspecified: Secondary | ICD-10-CM | POA: Insufficient documentation

## 2020-08-20 DIAGNOSIS — R609 Edema, unspecified: Secondary | ICD-10-CM | POA: Diagnosis present

## 2020-08-20 DIAGNOSIS — N183 Chronic kidney disease, stage 3 unspecified: Secondary | ICD-10-CM | POA: Diagnosis present

## 2020-08-20 DIAGNOSIS — D631 Anemia in chronic kidney disease: Secondary | ICD-10-CM | POA: Insufficient documentation

## 2020-08-20 DIAGNOSIS — N179 Acute kidney failure, unspecified: Secondary | ICD-10-CM

## 2020-08-20 DIAGNOSIS — M797 Fibromyalgia: Secondary | ICD-10-CM | POA: Insufficient documentation

## 2020-08-20 DIAGNOSIS — I5041 Acute combined systolic (congestive) and diastolic (congestive) heart failure: Secondary | ICD-10-CM | POA: Diagnosis present

## 2020-08-20 DIAGNOSIS — Z7901 Long term (current) use of anticoagulants: Secondary | ICD-10-CM | POA: Insufficient documentation

## 2020-08-20 DIAGNOSIS — I10 Essential (primary) hypertension: Secondary | ICD-10-CM | POA: Diagnosis present

## 2020-08-20 DIAGNOSIS — Z7182 Exercise counseling: Secondary | ICD-10-CM | POA: Insufficient documentation

## 2020-08-20 DIAGNOSIS — I5033 Acute on chronic diastolic (congestive) heart failure: Secondary | ICD-10-CM | POA: Insufficient documentation

## 2020-08-20 DIAGNOSIS — Z794 Long term (current) use of insulin: Secondary | ICD-10-CM | POA: Insufficient documentation

## 2020-08-20 DIAGNOSIS — Z86718 Personal history of other venous thrombosis and embolism: Secondary | ICD-10-CM | POA: Insufficient documentation

## 2020-08-20 DIAGNOSIS — Z713 Dietary counseling and surveillance: Secondary | ICD-10-CM | POA: Insufficient documentation

## 2020-08-20 DIAGNOSIS — E1122 Type 2 diabetes mellitus with diabetic chronic kidney disease: Secondary | ICD-10-CM | POA: Insufficient documentation

## 2020-08-20 DIAGNOSIS — G4733 Obstructive sleep apnea (adult) (pediatric): Secondary | ICD-10-CM | POA: Insufficient documentation

## 2020-08-20 LAB — COMPREHENSIVE METABOLIC PANEL
ALT: 16 U/L (ref 0–55)
AST (SGOT): 12 U/L (ref 5–34)
Albumin/Globulin Ratio: 0.9 (ref 0.9–2.2)
Albumin: 3.1 g/dL — ABNORMAL LOW (ref 3.5–5.0)
Alkaline Phosphatase: 89 U/L (ref 37–106)
Anion Gap: 9 (ref 5.0–15.0)
BUN: 60 mg/dL — ABNORMAL HIGH (ref 7.0–19.0)
Bilirubin, Total: 0.1 mg/dL — ABNORMAL LOW (ref 0.2–1.2)
CO2: 24 mEq/L (ref 22–29)
Calcium: 9.3 mg/dL (ref 8.5–10.5)
Chloride: 105 mEq/L (ref 100–111)
Creatinine: 1.6 mg/dL — ABNORMAL HIGH (ref 0.6–1.0)
Globulin: 3.6 g/dL (ref 2.0–3.6)
Glucose: 111 mg/dL — ABNORMAL HIGH (ref 70–100)
Potassium: 4.2 mEq/L (ref 3.5–5.1)
Protein, Total: 6.7 g/dL (ref 6.0–8.3)
Sodium: 138 mEq/L (ref 136–145)

## 2020-08-20 LAB — HEMOLYSIS INDEX: Hemolysis Index: -1 — ABNORMAL LOW (ref 0–18)

## 2020-08-20 LAB — CBC AND DIFFERENTIAL
Absolute NRBC: 0 10*3/uL (ref 0.00–0.00)
Basophils Absolute Automated: 0.08 10*3/uL (ref 0.00–0.08)
Basophils Automated: 0.4 %
Eosinophils Absolute Automated: 0.41 10*3/uL (ref 0.00–0.44)
Eosinophils Automated: 2.3 %
Hematocrit: 25.3 % — ABNORMAL LOW (ref 34.7–43.7)
Hgb: 7.5 g/dL — ABNORMAL LOW (ref 11.4–14.8)
Immature Granulocytes Absolute: 0.33 10*3/uL — ABNORMAL HIGH (ref 0.00–0.07)
Immature Granulocytes: 1.9 %
Lymphocytes Absolute Automated: 3.9 10*3/uL — ABNORMAL HIGH (ref 0.42–3.22)
Lymphocytes Automated: 21.9 %
MCH: 26 pg (ref 25.1–33.5)
MCHC: 29.6 g/dL — ABNORMAL LOW (ref 31.5–35.8)
MCV: 87.8 fL (ref 78.0–96.0)
MPV: 10.2 fL (ref 8.9–12.5)
Monocytes Absolute Automated: 1.25 10*3/uL — ABNORMAL HIGH (ref 0.21–0.85)
Monocytes: 7 %
Neutrophils Absolute: 11.82 10*3/uL — ABNORMAL HIGH (ref 1.10–6.33)
Neutrophils: 66.5 %
Nucleated RBC: 0 /100 WBC (ref 0.0–0.0)
Platelets: 392 10*3/uL — ABNORMAL HIGH (ref 142–346)
RBC: 2.88 10*6/uL — ABNORMAL LOW (ref 3.90–5.10)
RDW: 13 % (ref 11–15)
WBC: 17.79 10*3/uL — ABNORMAL HIGH (ref 3.10–9.50)

## 2020-08-20 LAB — COVID-19 (SARS-COV-2): SARS CoV 2 Overall Result: NEGATIVE

## 2020-08-20 LAB — GLUCOSE WHOLE BLOOD - POCT
Whole Blood Glucose POCT: 120 mg/dL — ABNORMAL HIGH (ref 70–100)
Whole Blood Glucose POCT: 198 mg/dL — ABNORMAL HIGH (ref 70–100)
Whole Blood Glucose POCT: 201 mg/dL — ABNORMAL HIGH (ref 70–100)
Whole Blood Glucose POCT: 269 mg/dL — ABNORMAL HIGH (ref 70–100)
Whole Blood Glucose POCT: 269 mg/dL — ABNORMAL HIGH (ref 70–100)
Whole Blood Glucose POCT: 56 mg/dL — ABNORMAL LOW (ref 70–100)
Whole Blood Glucose POCT: 81 mg/dL (ref 70–100)

## 2020-08-20 LAB — GFR: EGFR: 35.5

## 2020-08-20 LAB — B-TYPE NATRIURETIC PEPTIDE: B-Natriuretic Peptide: 15 pg/mL (ref 0–100)

## 2020-08-20 LAB — TROPONIN I: Troponin I: <0.01 ng/mL (ref 0.00–0.05)

## 2020-08-20 MED ORDER — ONDANSETRON HCL 4 MG/2ML IJ SOLN
4.0000 mg | Freq: Once | INTRAMUSCULAR | Status: DC | PRN
Start: 2020-08-20 — End: 2020-08-20

## 2020-08-20 MED ORDER — ACETAMINOPHEN 325 MG PO TABS
650.0000 mg | ORAL_TABLET | Freq: Once | ORAL | Status: DC | PRN
Start: 2020-08-20 — End: 2020-08-20

## 2020-08-20 MED ORDER — INSULIN LISPRO 100 UNIT/ML SC SOLN
1.0000 [IU] | Freq: Every evening | SUBCUTANEOUS | Status: DC
Start: 2020-08-20 — End: 2020-08-21
  Administered 2020-08-20: 3 [IU] via SUBCUTANEOUS
  Filled 2020-08-20: qty 15
  Filled 2020-08-20: qty 9

## 2020-08-20 MED ORDER — HYDRALAZINE HCL 50 MG PO TABS
50.0000 mg | ORAL_TABLET | Freq: Two times a day (BID) | ORAL | Status: DC
Start: 2020-08-20 — End: 2020-08-21
  Administered 2020-08-20 – 2020-08-21 (×2): 50 mg via ORAL
  Filled 2020-08-20: qty 2
  Filled 2020-08-20: qty 1

## 2020-08-20 MED ORDER — PANTOPRAZOLE SODIUM 40 MG PO TBEC
40.0000 mg | DELAYED_RELEASE_TABLET | Freq: Every morning | ORAL | Status: DC
Start: 2020-08-20 — End: 2020-08-22
  Administered 2020-08-21 – 2020-08-22 (×2): 40 mg via ORAL
  Filled 2020-08-20 (×2): qty 1

## 2020-08-20 MED ORDER — GABAPENTIN 300 MG PO CAPS
600.0000 mg | ORAL_CAPSULE | Freq: Three times a day (TID) | ORAL | Status: DC
Start: 2020-08-20 — End: 2020-08-22
  Administered 2020-08-20 – 2020-08-22 (×6): 600 mg via ORAL
  Filled 2020-08-20 (×6): qty 2

## 2020-08-20 MED ORDER — GLUCOSE 40 % PO GEL
15.0000 g | ORAL | Status: DC | PRN
Start: 2020-08-20 — End: 2020-08-22

## 2020-08-20 MED ORDER — HYDROXYZINE HCL 10 MG PO TABS
10.0000 mg | ORAL_TABLET | Freq: Four times a day (QID) | ORAL | Status: DC | PRN
Start: 2020-08-20 — End: 2020-08-22
  Administered 2020-08-20: 10 mg via ORAL
  Filled 2020-08-20: qty 1

## 2020-08-20 MED ORDER — GLUCAGON 1 MG IJ SOLR (WRAP)
1.0000 mg | INTRAMUSCULAR | Status: DC | PRN
Start: 2020-08-20 — End: 2020-08-22

## 2020-08-20 MED ORDER — INSULIN GLARGINE 100 UNIT/ML SC SOLN
20.0000 [IU] | Freq: Two times a day (BID) | SUBCUTANEOUS | Status: DC
Start: 2020-08-20 — End: 2020-08-22
  Administered 2020-08-20 – 2020-08-22 (×4): 20 [IU] via SUBCUTANEOUS
  Filled 2020-08-20 (×4): qty 20

## 2020-08-20 MED ORDER — BACLOFEN 10 MG PO TABS
10.0000 mg | ORAL_TABLET | Freq: Three times a day (TID) | ORAL | Status: DC | PRN
Start: 2020-08-20 — End: 2020-08-22
  Administered 2020-08-20: 10 mg via ORAL
  Filled 2020-08-20: qty 1

## 2020-08-20 MED ORDER — ATORVASTATIN CALCIUM 20 MG PO TABS
20.0000 mg | ORAL_TABLET | Freq: Every evening | ORAL | Status: DC
Start: 2020-08-20 — End: 2020-08-22
  Administered 2020-08-20 – 2020-08-21 (×2): 20 mg via ORAL
  Filled 2020-08-20 (×2): qty 1

## 2020-08-20 MED ORDER — ONDANSETRON HCL 4 MG/2ML IJ SOLN
4.0000 mg | Freq: Once | INTRAMUSCULAR | Status: AC
Start: 2020-08-20 — End: 2020-08-20
  Administered 2020-08-20: 07:00:00 4 mg via INTRAVENOUS
  Filled 2020-08-20: qty 2

## 2020-08-20 MED ORDER — APIXABAN 5 MG PO TABS
5.0000 mg | ORAL_TABLET | Freq: Two times a day (BID) | ORAL | Status: DC
Start: 2020-08-20 — End: 2020-08-22
  Administered 2020-08-20 – 2020-08-22 (×5): 5 mg via ORAL
  Filled 2020-08-20 (×6): qty 1

## 2020-08-20 MED ORDER — FUROSEMIDE 10 MG/ML IJ SOLN
40.0000 mg | Freq: Two times a day (BID) | INTRAMUSCULAR | Status: DC
Start: 2020-08-20 — End: 2020-08-21
  Administered 2020-08-20 – 2020-08-21 (×2): 40 mg via INTRAVENOUS
  Filled 2020-08-20 (×3): qty 4

## 2020-08-20 MED ORDER — FUROSEMIDE 10 MG/ML IJ SOLN
60.0000 mg | Freq: Once | INTRAMUSCULAR | Status: AC
Start: 2020-08-20 — End: 2020-08-20
  Administered 2020-08-20: 03:00:00 60 mg via INTRAVENOUS
  Filled 2020-08-20: qty 6

## 2020-08-20 MED ORDER — DEXTROSE 50 % IV SOLN
12.5000 g | INTRAVENOUS | Status: DC | PRN
Start: 2020-08-20 — End: 2020-08-22

## 2020-08-20 MED ORDER — CARVEDILOL 25 MG PO TABS
25.0000 mg | ORAL_TABLET | Freq: Two times a day (BID) | ORAL | Status: DC
Start: 2020-08-20 — End: 2020-08-22
  Administered 2020-08-20 – 2020-08-22 (×4): 25 mg via ORAL
  Filled 2020-08-20 (×4): qty 1

## 2020-08-20 MED ORDER — ONDANSETRON HCL 4 MG/2ML IJ SOLN
4.0000 mg | Freq: Three times a day (TID) | INTRAMUSCULAR | Status: DC | PRN
Start: 2020-08-20 — End: 2020-08-20

## 2020-08-20 MED ORDER — INSULIN LISPRO 100 UNIT/ML SC SOLN
1.0000 [IU] | Freq: Three times a day (TID) | SUBCUTANEOUS | Status: DC
Start: 2020-08-20 — End: 2020-08-21
  Administered 2020-08-20: 21:00:00 5 [IU] via SUBCUTANEOUS

## 2020-08-20 MED ORDER — DEXTROSE 50 % IV SOLN
50.0000 mL | Freq: Once | INTRAVENOUS | Status: AC
Start: 2020-08-20 — End: 2020-08-20
  Administered 2020-08-20: 07:00:00 50 mL via INTRAVENOUS
  Filled 2020-08-20: qty 50

## 2020-08-20 NOTE — ED Notes (Signed)
ED MD aware of blood glucose of 198. Per ED MD, inpatient medical team will address. RN will continue to monitor.

## 2020-08-20 NOTE — Plan of Care (Signed)
Patient arrived to unit at 1830. RN faxed telemetry request to [phone] and retrieved a telemetry box from the telemetry unit. Patient's vital signs were taken, and a two nurse skin check was completed by Georgette Dover, RN and Ladell Pier, RN. Patient's skin is intact. SCDs were placed on the patient, and SCDs were placed. Pateint was oriented to room. Call light within reach.

## 2020-08-20 NOTE — ED Notes (Signed)
[name] EMERGENCY DEPARTMENT  ED NURSING NOTE FOR THE RECEIVING INPATIENT NURSE   ED NURSE Tally Due 1191   ED CHARGE RN [phone]   ADMISSION INFORMATION   [name] is a 39 y.o. female admitted with a diagnosis of:    1. CHF NYHA class III, acute, combined         Isolation: None   Allergies: Bactrim [sulfamethoxazole-trimethoprim]   Holding Orders confirmed? Yes   Belongings Documented? Yes   Home medications sent to pharmacy confirmed? No   NURSING CARE   Patient Comes From:   Mental Status: Home Independent  alert and oriented   ADL: Independent with all ADLs   Ambulation: no difficulty   Pertinent Information  and Safety Concerns: N/A     COVID Test sent to lab? Yes   VITAL SIGNS   Time BP Temp Pulse Resp SpO2   1216 125/68 98.4 86 16 100% on RA   CT / NIH   CT Head ordered on this patient?  No   NIH/Dysphagia assessment done prior to admission? No   PERSONAL PROTECTIVE EQUIPMENT   Gloves   LAB RESULTS   Labs Reviewed - No data to display       Ticket to Ride Printed: Yes

## 2020-08-20 NOTE — ED Notes (Signed)
Bed: PU32  Expected date: 08/20/20  Expected time: 10:48 AM  Means of arrival: [id number]- Altha Harm  Comments:  Medic [phone]

## 2020-08-20 NOTE — ED Triage Notes (Signed)
[name] is a 39 y.o. female presenting to the ED for coughing, blurred vision, back pain. Patient was recently in the ICU for hyperglycemia and asthma exacerbation. Was not started on lasix. Patient reports having a lot of fluid on her. Patient currently reports being heart failure and renal failure. Last void about 1 hr ago, first time using the restroom today. Glucose at home about 200. Fully vaccinated against covid.

## 2020-08-20 NOTE — ED Notes (Signed)
[facility] EMERGENCY DEPARTMENT  ED NURSING NOTE FOR THE RECEIVING INPATIENT NURSE   ED NURSE Rachael Fee [phone]   ED CHARGE RN [phone]   ADMISSION INFORMATION   [name] is a 39 y.o. female admitted with a diagnosis of:    1. Peripheral edema         Isolation: Contact MRSA   Allergies: Bactrim [sulfamethoxazole-trimethoprim]   Holding Orders confirmed? Yes   Belongings Documented? Yes   Home medications sent to pharmacy confirmed? No   NURSING CARE   Patient Comes From:   Mental Status: Home Independent  alert and oriented   ADL: Independent with all ADLs   Ambulation: mild difficulty   Pertinent Information  and Safety Concerns: Pt on RA, covid negative     COVID Test sent to lab? Yes   VITAL SIGNS   Time BP Temp Pulse Resp SpO2   0417 140/63 98.2 78 13 100   CT / NIH   CT Head ordered on this patient?  No   NIH/Dysphagia assessment done prior to admission? No   PERSONAL PROTECTIVE EQUIPMENT   Gloves and Procedure Gown   LAB RESULTS   Labs Reviewed   CBC AND DIFFERENTIAL - Abnormal; Notable for the following components:       Result Value    WBC 17.79 (*)     Hgb 7.5 (*)     Hematocrit 25.3 (*)     Platelets 392 (*)     RBC 2.88 (*)     MCHC 29.6 (*)     Neutrophils Absolute 11.82 (*)     Lymphocytes Absolute Automated 3.90 (*)     Monocytes Absolute Automated 1.25 (*)     Immature Granulocytes Absolute 0.33 (*)     All other components within normal limits   COMPREHENSIVE METABOLIC PANEL - Abnormal; Notable for the following components:    Glucose 111 (*)     BUN 60.0 (*)     Creatinine 1.6 (*)     Albumin 3.1 (*)     Bilirubin, Total 0.1 (*)     All other components within normal limits   HEMOLYSIS INDEX - Abnormal; Notable for the following components:    Hemolysis Index -1 (*)     All other components within normal limits   COVID-19 (SARS-COV-2)    Narrative:     o Collect and clearly label specimen type:  o Upper respiratory specimen: One Nasopharyngeal Dry Swab NO  Transport Media.  o Hand deliver to  laboratory ASAP  Indication for testing->Extended care facility admission to  semi private room  Screening   TROPONIN I   B-TYPE NATRIURETIC PEPTIDE   GFR          Ticket to Ride Printed: Yes

## 2020-08-20 NOTE — ED Notes (Signed)
Pt had to leave due to her minor child missing. Pt IV out.

## 2020-08-20 NOTE — Consults (Signed)
[facility]    CONSULTATION      Date Time: 08/20/20 12:20 PM  Patient Name: [name]  Requesting Physician: Mikel Cella, MD      Reason for Consultation:    AKI/CKD/volume overload    Impression:    CKD 3B/4, baseline creatinine CR around 2, varies volume status.   Presents with fluid overload partly due to recent hospitalization for HHS and aggressive hydration   Possible CHF   HTN   Anemia of chronic illness   Type 2 diabetes, recent admission with HHS    Unremarkable 5.9 cm, left kidney 9.6 cm, no hydronephrosis no stones or mass.  Prevoid bladder volume 35 mL, PVR 3 cc.    Recommendations:    2 g sodium and 1800 mL fluid restriction.   Continue IV Lasix   Screening urinalysis and quantification of proteinuria   Hold respiratory agents during attempts at aggressive diuresis   Check iron stores and give Aranesp   Additional CKD work-up including PTH and vitamin D      Thank you for involving me in the care of this patient.    History:   [name] is a 39 y.o. female who presents to the hospital on 08/20/2020 with a several day history of progressive bilateral leg swelling associated with increasing shortness of breath and generalized fatigue.  She takes Bumex 2 mg daily at home, has not missed any doses, does not restrict her fluid intake.  Other relevant past medical history includes history of venous thromboembolism, at home.  She was hospitalized for HHS about a week ago to our hospital and received a lot of IV fluids, she feels this is contributed to her worsening leg edema.  She has elevated creatinine and I am asked to assist in evaluation and management.      Past Medical History:     Past Medical History:   Diagnosis Date   . Arthritis    . Asthma    . Chronic kidney disease    . Congestive heart failure    . Diabetes mellitus    . Fibromyalgia    . Hyperlipidemia    . Hypertension    . Neuropathy    . Pulmonary embolism 08/2019   .  Vertigo        Past Surgical History:     Past Surgical History:   Procedure Laterality Date   . HYSTERECTOMY  12/2019       Medications:      Scheduled Meds:     apixaban, 5 mg, Oral, Q12H SCH  carvedilol, 25 mg, Oral, BID  gabapentin, 600 mg, Oral, TID  hydrALAZINE, 50 mg, Oral, BID          Continuous Infusions:        PRN Meds:baclofen    Allergies:     Allergies   Allergen Reactions   . Bactrim [Sulfamethoxazole-Trimethoprim] Hives, Respiratory Distress and Edema       Family History:     Family History   Problem Relation Age of Onset   . Heart disease Mother    . Hypertension Mother    . Diabetes Mother    . Asthma Father    . Hypertension Father    . Diabetes Daughter    . Asthma Son    . Cancer Maternal Aunt    . Cancer Maternal Grandmother        Social History:     Social History  Socioeconomic History   . Marital status: Divorced     Spouse name: Not on file   . Number of children: Not on file   . Years of education: Not on file   . Highest education level: Not on file   Occupational History   . Not on file   Tobacco Use   . Smoking status: Never Smoker   . Smokeless tobacco: Never Used   Vaping Use   . Vaping Use: Never used   Substance and Sexual Activity   . Alcohol use: Never   . Drug use: Never   . Sexual activity: Not on file   Other Topics Concern   . Not on file   Social History Narrative   . Not on file     Social Determinants of Health     Financial Resource Strain:    . Difficulty of Paying Living Expenses:    Food Insecurity:    . Worried About Programme researcher, broadcasting/film/video in the Last Year:    . Barista in the Last Year:    Transportation Needs:    . Freight forwarder (Medical):    Marland Kitchen Lack of Transportation (Non-Medical):    Physical Activity:    . Days of Exercise per Week:    . Minutes of Exercise per Session:    Stress:    . Feeling of Stress :    Social Connections:    . Frequency of Communication with Friends and Family:    . Frequency of Social Gatherings with Friends and Family:     . Attends Religious Services:    . Active Member of Clubs or Organizations:    . Attends Banker Meetings:    Marland Kitchen Marital Status:    Intimate Partner Violence:    . Fear of Current or Ex-Partner:    . Emotionally Abused:    Marland Kitchen Physically Abused:    . Sexually Abused:        Review of Systems:   General ROS: negative for - chills,  fever  Psychological ROS: negative for - anxiety , depression  Ophthalmic ROS: negative for - blurry vision or eye pain  ENT ROS: negative for - nasal congestion or oral lesions  Allergy and Immunology ROS: negative for - hives  Hematological and Lymphatic ROS: negative for - bleeding problems  Endocrine ROS: negative for - temperature intolerance  Respiratory ROS: no cough, shortness of breath, or wheezing  Cardiovascular ROS: no chest pain or dyspnea on exertion  Gastrointestinal ROS: no abdominal pain, change in bowel habits, or black or bloody stools  Genito-Urinary ROS: no dysuria, trouble voiding, or hematuria  Musculoskeletal ROS: negative for - joint pain or joint swelling  Neurological ROS: negative for - numbness/tingling or weakness  Dermatological ROS: negative for rash    Objective:  No specific c/o    Vital signs in last 24 hours:  Temp:  [98.2 F (36.8 C)-98.7 F (37.1 C)] 98.4 F (36.9 C)  Heart Rate:  [76-95] 87  Resp Rate:  [13-22] 18  BP: (125-157)/(58-74) 134/58  Intake/Output last 24 hours:  No intake or output data in the 24 hours ending 08/20/20 1220  Intake/Output this shift:  No intake/output data recorded.    Labs Reviewed:     Recent Labs   Lab 08/20/20  0115   Sodium 138   Potassium 4.2   Chloride 105   CO2 24   BUN 60.0*   Creatinine 1.6*  Calcium 9.3   Glucose 111*   Albumin 3.1*   EGFR 35.5     Recent Labs   Lab 08/20/20  0115   WBC 17.79*   Hgb 7.5*   Hematocrit 25.3*   MCV 87.8   MCH 26.0   MCHC 29.6*   RDW 13   MPV 10.2   Platelets 392*                     Invalid input(s): CLARITYUS    Rads:   Radiological Procedure reviewed.   No results  found for this or any previous visit.      Physical Exam:      Gen: NAD   CV: S1 S2 RRR   Chest: CTA   Ab: S/NT/ND/ +BS   Ext: 2+ edema    Signed by: Dione Housekeeper, MD, M.D.  [facility]  Office [phone]

## 2020-08-20 NOTE — H&P (Signed)
ADMISSION HISTORY AND PHYSICAL EXAM    Date Time: 08/20/20 5:25 PM  Patient Name: [name]  Attending Physician: Mikel Cella, MD    Assessment:   -Volume overload in setting of diastolic heart failure and acute on chronic kidney failure  -Acute on chronic kidney failure with baseline creatinine of 1.6-1.7  -Hypertension  -Anemia due to chronic disease  -Type 2 diabetes with history of recent admission 08/11/20 to 08/13/20 for HHS status post fluid boluses  -History of diabetic neuropathy and fibromyalgia  -Morbid obesity with BMI of 51.00 kg/M2  -History of asthma    Plan:   39 year old [place] American female with history of recent hospitalization for HHS received IV fluids and blood products (Vaginal bleeding s/p Angigraphy/Embolization) came to the emergency room for worsening weight gain of 20 lbs in 6 days and DOE, admitted for diuresis 2/2 diastolic dysfunction.    -Patient received 60 mg of IV Lasix in the ED, will continue Lasix at 40 twice daily  -Strict I's and O's with fluid restriction at 1800 mL / 24 hours  -Daily weight  -Last echocardiogram showed diastolic dysfunction with preserved ejection fraction  -Nutritionist consult for dietary education  -Advised diet and exercise for weight management as well as controlling her blood sugars  -Nephrology is on board  -Agree with screening urinalysis  -Agree with checking iron profile and possible Aranesp  -Avoid nephrotoxic drugs and renally dose medications, and closely monitor renal panel  -We will resume home Eliquis, carvedilol, hydralazine, Neurontin and as needed baclofen.  -Add correctional insulin, resume home Lantus at a lower dose of 20 units twice daily  -Patient's hemoglobin A1c noted at 8.8.  -Patient had a sleep study done on 08/22/2013 that was negative for the presence of obstructive sleep apnea / hypopnea syndrome   -We will continue to hold respiratory agents per nephrology request while diuresing.      History of Present  Illness:   [name] is a 39 y.o. female with medical history of asthma, D- CHF, PE on Eliquis, Anemia resulting from heparin drip started for PE leading to 6 units of PRBCs, uterine angiography/embolization, HHS, hypertension, hyperlipidemia, diabetes, morbid obesity and GERD who presented with bilateral lower extremity edema, increasing abdominal girth, weight gain of 20 pounds in 6 days, shortness of breath on exertion and fatigue.  Patient states she has been gaining weight since discharge after receiving multiple volumes of fluids while in ICU, she has been taking her Bumex 2 mg daily with no improvement and has been restricting her fluid intake. She reports history of being on Lasix drip due to edema at [facility] with improvement and was requesting to be on Lasix drip.  Nephrology consulted to assist with her volume management in view of CKD.  She also reports intermittent abdominal discomfort.  She denies chest pain, lightheadedness, shortness of breath at rest, orthopnea, PND, URI symptoms, urinary symptoms.  She endorses minimal urine output in the past few days, generalized body aches due to fibromyalgia and peripheral neuropathy due to diabetes.    In the emergency room, she received 60 mg of IV Lasix with recommendations for admission to continue IV diuretic.  Her EKG showed normal sinus rhythm at 90 with no STEMI, chest x-ray is normal, BNP is normal, H&H noted at 7.5/25.3, BUN/creatinine of 60/1.6.  Nephrology Dr. Verdell Face consulted.  Abdominal ultrasound showed no ascites, moderate hepatomegaly.    Past Medical History:     Past Medical History:   Diagnosis Date   .  Arthritis    . Asthma    . Chronic kidney disease    . Congestive heart failure    . Diabetes mellitus    . Fibromyalgia    . Hyperlipidemia    . Hypertension    . Neuropathy    . Pulmonary embolism 08/2019   . Vertigo        Past Surgical History:     Past Surgical History:   Procedure Laterality Date   . HYSTERECTOMY  12/2019        Family History:     Family History   Problem Relation Age of Onset   . Heart disease Mother    . Hypertension Mother    . Diabetes Mother    . Asthma Father    . Hypertension Father    . Diabetes Daughter    . Asthma Son    . Cancer Maternal Aunt    . Cancer Maternal Grandmother        Social History:     Social History     Socioeconomic History   . Marital status: Divorced     Spouse name: Not on file   . Number of children: Not on file   . Years of education: Not on file   . Highest education level: Not on file   Occupational History   . Not on file   Tobacco Use   . Smoking status: Never Smoker   . Smokeless tobacco: Never Used   Vaping Use   . Vaping Use: Never used   Substance and Sexual Activity   . Alcohol use: Never   . Drug use: Never   . Sexual activity: Not on file   Other Topics Concern   . Not on file   Social History Narrative   . Not on file     Social Determinants of Health     Financial Resource Strain:    . Difficulty of Paying Living Expenses:    Food Insecurity:    . Worried About Programme researcher, broadcasting/film/video in the Last Year:    . Barista in the Last Year:    Transportation Needs:    . Freight forwarder (Medical):    Marland Kitchen Lack of Transportation (Non-Medical):    Physical Activity:    . Days of Exercise per Week:    . Minutes of Exercise per Session:    Stress:    . Feeling of Stress :    Social Connections:    . Frequency of Communication with Friends and Family:    . Frequency of Social Gatherings with Friends and Family:    . Attends Religious Services:    . Active Member of Clubs or Organizations:    . Attends Banker Meetings:    Marland Kitchen Marital Status:    Intimate Partner Violence:    . Fear of Current or Ex-Partner:    . Emotionally Abused:    Marland Kitchen Physically Abused:    . Sexually Abused:        Allergies:     Allergies   Allergen Reactions   . Bactrim [Sulfamethoxazole-Trimethoprim] Hives, Respiratory Distress and Edema       Medications:   (Not in a hospital  admission)      Review of Systems:   A comprehensive review of systems was:   History obtained from the patient  General ROS: positive for  - weight gain  Psychological ROS: positive for - anxiety  Allergy  and Immunology ROS: negative  Hematological and Lymphatic ROS: positive for - blood clots, blood transfusions and fatigue  Endocrine ROS: positive for - unexpected weight changes  Respiratory ROS: positive for - cough and shortness of breath  Cardiovascular ROS: no chest pain or dyspnea on exertion  Gastrointestinal ROS: positive for - abdominal pain  Musculoskeletal ROS: positive for - muscle pain and swelling in foot - bilateral, generalized and leg - bilateral  Neurological ROS: no TIA or stroke symptoms  Dermatological ROS: negative    Physical Exam:     Vitals:    08/20/20 1712   BP: 108/61   Pulse: 100   Resp: 14   Temp:    SpO2: 99%       Intake and Output Summary (Last 24 hours) at Date Time  No intake or output data in the 24 hours ending 08/20/20 1725    Constitutional: Morbidly obese, well appearing. In NAD   Head: Normocephalic, atraumatic  Eyes: Conjunctiva and sclera are normal.  No injection or discharge.  Ears, Nose, Throat:  Normal external examination of the nose and ears. No throat or oropharyngeal swelling or erythema. Midline uvula. Mucous membranes moist.  Neck: Normal range of motion. Supple, no meningeal signs. Trachea midline. No stridor. No JVD  Respiratory/Chest: Clear to auscultation but diminished bil. No respiratory distress.   Cardiovascular: Regular rate and rhythm. No murmurs.  Abdomen:  Bowel sounds intact. No rebound or guarding. Soft, distended,  Upper Extremity:  No edema. No cyanosis. Bilateral radial pulses intact and equal.   Lower Extremity:  +3 pitting symmetrical bilateral lower extremity edema. No cyanosis.  Bilateral femoral, DP, PT pulses intact and equal.  Skin: Warm and dry. No rash.  Neuro: A&Ox3. CNII -XII intact   Psychiatric: Normal affect.but anxious about  weight gain     Labs:     Results     Procedure Component Value Units Date/Time    Glucose Whole Blood - POCT [[id number]]  (Abnormal) Collected: 08/20/20 1715     Updated: 08/20/20 1717     Whole Blood Glucose POCT 201 mg/dL     Glucose Whole Blood - POCT [[id number]]  (Abnormal) Collected: 08/20/20 1446     Updated: 08/20/20 1449     Whole Blood Glucose POCT 120 mg/dL               Rads:   Radiological Procedure reviewed.    Signed by: Pilar Plate, NP

## 2020-08-20 NOTE — ED Provider Notes (Signed)
EMERGENCY DEPARTMENT HISTORY AND PHYSICAL EXAM    Date: 08/20/2020  Patient Name: [name]  Attending Physician:  Lavonda Jumbo, MD, FACEP FAEMS  Diagnosis and Treatment Plan       Clinical Impression:   Final diagnoses:   CHF NYHA class III, acute, combined       Treatment Plan:   ED Disposition     ED Disposition Condition Date/Time Comment    Observation  Mon Aug 20, 2020 11:20 AM Admitting Physician: Mikel Cella [[id number]]   Service:: Cardiology [[id number]]   Estimated Length of Stay: < 2 midnights   Tentative Discharge Plan?: Home or Self Care [1]   Does patient need telemetry?: Yes   Telemetry type (separate Telemetry order is also required):: Remote telemetry [facility] only)            History of Presenting Illness     Chief Complaint   Patient presents with   . Anxiety       History Provided By: Patient  Chief Complaint: Short of breath  Onset: 2 weeks  Timing: Gradual worsening  Location: Diffuse  Quality: Weak  Severity: Moderate  Modifying Factors: Was admitted to the hospital a few hours ago then eloped and came back by ambulance demanding admission  Associated sxs: No nausea vomiting chest pain        Additional History: [name] is a 39 y.o. female.    PCP: Nash Mantis, MD        Current Facility-Administered Medications:   .  acetaminophen (TYLENOL) tablet 650 mg, 650 mg, Oral, Q6H PRN, Lorenda Cahill, PA, 650 mg at 08/21/20 0120  .  apixaban (ELIQUIS) tablet 5 mg, 5 mg, Oral, Q12H SCH, Pilar Plate, NP, 5 mg at 08/21/20 0911  .  atorvastatin (LIPITOR) tablet 20 mg, 20 mg, Oral, QHS, Pilar Plate, NP, 20 mg at 08/20/20 2104  .  baclofen (LIORESAL) tablet 10 mg, 10 mg, Oral, TID PRN, Pilar Plate, NP, 10 mg at 08/20/20 2356  .  carvedilol (COREG) tablet 25 mg, 25 mg, Oral, BID, Pilar Plate, NP, 25 mg at 08/21/20 0912  .  Nursing communication: Adult Hypoglycemia Treatment Algorithm, , , Until Discontinued **AND** dextrose (GLUCOSE) 40 % oral gel 15 g of  glucose, 15 g of glucose, Oral, PRN **AND** dextrose 50 % bolus 12.5 g, 12.5 g, Intravenous, PRN **AND** glucagon (rDNA) (GLUCAGEN) injection 1 mg, 1 mg, Intramuscular, PRN, Pilar Plate, NP  .  furosemide (LASIX) injection 40 mg, 40 mg, Intravenous, BID, Dione Housekeeper, MD, 40 mg at 08/21/20 0913  .  gabapentin (NEURONTIN) capsule 600 mg, 600 mg, Oral, TID, Pilar Plate, NP, 600 mg at 08/21/20 0500  .  hydrALAZINE (APRESOLINE) tablet 50 mg, 50 mg, Oral, BID, Pilar Plate, NP, 50 mg at 08/21/20 0912  .  hydrOXYzine (ATARAX) tablet 10 mg, 10 mg, Oral, Q6H PRN, Pilar Plate, NP, 10 mg at 08/20/20 2357  .  insulin glargine (LANTUS) injection 20 Units, 20 Units, Subcutaneous, Q12H, Pilar Plate, NP, 20 Units at 08/21/20 0500  .  insulin lispro (HumaLOG) injection 1-6 Units, 1-6 Units, Subcutaneous, QHS **AND** NSG Communication: Glucose POCT order, , , At bedtime, Annia Friendly, Sarah D, PA  .  insulin lispro (HumaLOG) injection 2-10 Units, 2-10 Units, Subcutaneous, TID AC, 4 Units at 08/21/20 0914 **AND** NSG Communication: Glucose POCT order, , , AC, Beard, Sarah D, PA  .  pantoprazole (PROTONIX) EC tablet 40 mg, 40 mg, Oral, QAM AC, Pilar Plate,  NP, 40 mg at 08/21/20 0912    ALL Past Medical History, Surgical History, Family History, and Social History available at  Physician/Midlevel provider first contact with patient: 08/20/20 1102      were reviewed at this time.    Past Medical History     Past Medical History:   Diagnosis Date   . Arthritis    . Asthma    . Chronic kidney disease    . Congestive heart failure    . Diabetes mellitus    . Fibromyalgia    . Hyperlipidemia    . Hypertension    . Neuropathy    . Pulmonary embolism 08/2019   . Vertigo      Past Surgical History:   Procedure Laterality Date   . HYSTERECTOMY  12/2019       Family History     Family History   Problem Relation Age of Onset   . Heart disease Mother    . Hypertension Mother    . Diabetes Mother    . Asthma Father    .  Hypertension Father    . Diabetes Daughter    . Asthma Son    . Cancer Maternal Aunt    . Cancer Maternal Grandmother        Social History     Social History     Socioeconomic History   . Marital status: Divorced     Spouse name: Not on file   . Number of children: Not on file   . Years of education: Not on file   . Highest education level: Not on file   Occupational History   . Not on file   Tobacco Use   . Smoking status: Never Smoker   . Smokeless tobacco: Never Used   Vaping Use   . Vaping Use: Never used   Substance and Sexual Activity   . Alcohol use: Never   . Drug use: Never   . Sexual activity: Not on file   Other Topics Concern   . Not on file   Social History Narrative   . Not on file     Social Determinants of Health     Financial Resource Strain:    . Difficulty of Paying Living Expenses:    Food Insecurity:    . Worried About Programme researcher, broadcasting/film/video in the Last Year:    . Barista in the Last Year:    Transportation Needs:    . Freight forwarder (Medical):    Marland Kitchen Lack of Transportation (Non-Medical):    Physical Activity:    . Days of Exercise per Week:    . Minutes of Exercise per Session:    Stress:    . Feeling of Stress :    Social Connections:    . Frequency of Communication with Friends and Family:    . Frequency of Social Gatherings with Friends and Family:    . Attends Religious Services:    . Active Member of Clubs or Organizations:    . Attends Banker Meetings:    Marland Kitchen Marital Status:    Intimate Partner Violence:    . Fear of Current or Ex-Partner:    . Emotionally Abused:    Marland Kitchen Physically Abused:    . Sexually Abused:        Allergies     Allergies   Allergen Reactions   . Bactrim [Sulfamethoxazole-Trimethoprim] Hives, Respiratory Distress and Edema  Review of Systems         Review of Systems   Constitutional: Negative for chills and fever.   HENT: Negative for hearing loss and tinnitus.    Eyes: Negative for blurred vision and double vision.   Gastrointestinal:  Negative for blood in stool and heartburn.   Genitourinary: Negative for dysuria and urgency.   Neurological: Negative for sensory change and speech change.   All other systems reviewed and are negative.        Physical Exam     BP 132/83   Pulse 86   Temp 98.5 F (36.9 C) (Oral)   Resp 17   Ht 5' 5.98" (1.676 m)   Wt 143.3 kg   SpO2 100%   BMI 51.03 kg/m   Pulse Oximetry Analysis - Normal         Physical Examination: General appearance - alert, well appearing, and in no distress  Mental status - alert, oriented to person, place, and time  Eyes - pupils equal and reactive, extraocular eye movements intact  Nose - normal and patent, no discharge  Neck - grossly normal rom  Chest - clear to auscultation, no wheezes, rales or rhonchi, symmetric air entry  Only obese  Heart - normal rate, regular rhythm  Abdomen - soft, nontender, nondistended  Neurological - maex4, speech clear  NIHSS-0, grossly normal swallow study as patient handles secretions well  Musculoskeletal - no joint tenderness, deformity or swelling  Extremities - distal blood flow grossly normal, +3 pedal edema  Skin - normal coloration, no rashes where visualized   Psychiatric- alert, oriented, anxious    Negative orthostatics normal gait    EKG:   Interpreted by EDP. Sinus rhythm. Rate is normal. Nonspecific ST/Twave changes.  Monitor: Normal sinus rhythm.            Diagnostic Study Results     Labs -     Results     Procedure Component Value Units Date/Time    Glucose Whole Blood - POCT [[id number]]  (Abnormal) Collected: 08/21/20 0800     Updated: 08/21/20 0804     Whole Blood Glucose POCT 235 mg/dL     Ferritin [[id number]] Collected: 08/21/20 0506    Specimen: Blood Updated: 08/21/20 0743    PTH, Intact [[id number]] Collected: 08/21/20 0506    Specimen: Blood Updated: 08/21/20 0743    Vitamin D,25 OH, Total [[id number]] Collected: 08/21/20 0506    Specimen: Blood Updated: 08/21/20 0743    IRON PROFILE [[id number]] Collected: 08/21/20 0506      Updated: 08/21/20 0743    Basic Metabolic Panel [[id number]]  (Abnormal) Collected: 08/21/20 0506    Specimen: Blood Updated: 08/21/20 0656     Glucose 278 mg/dL      BUN 38.7 mg/dL      Creatinine 1.6 mg/dL      Calcium 8.6 mg/dL      Sodium 564 mEq/L      Potassium 4.9 mEq/L      Chloride 104 mEq/L      CO2 23 mEq/L      Anion Gap 10.0    Hemolysis index [[id number]] Collected: 08/21/20 0506     Updated: 08/21/20 0656     Hemolysis Index 3    GFR [[id number]] Collected: 08/21/20 0506     Updated: 08/21/20 0656     EGFR 35.5    Glucose Whole Blood - POCT [[id number]]  (Abnormal) Collected: 08/20/20 2336     Updated: 08/20/20 2339  Whole Blood Glucose POCT 269 mg/dL     Glucose Whole Blood - POCT [[id number]]  (Abnormal) Collected: 08/20/20 2034     Updated: 08/20/20 2036     Whole Blood Glucose POCT 269 mg/dL     Glucose Whole Blood - POCT [[id number]]  (Abnormal) Collected: 08/20/20 1715     Updated: 08/20/20 1717     Whole Blood Glucose POCT 201 mg/dL     Glucose Whole Blood - POCT [[id number]]  (Abnormal) Collected: 08/20/20 1446     Updated: 08/20/20 1449     Whole Blood Glucose POCT 120 mg/dL           Radiologic Studies -   Radiology Results (24 Hour)     Procedure Component Value Units Date/Time    US Abdomen Complete [[id number]] Collected: 08/20/20 1346    Order Status: Completed Updated: 08/20/20 1350    Narrative:      History: Abdominal swelling, ascites suspected    Findings: Sonographic evaluation of the abdomen is performed.    LIVER: Mildly heterogeneous in without increased echogenicity . Normal  contour. No focal mass. Mildly elongated in craniocaudad dimension    GALLBLADDER: Resected.    COMMON DUCT: Normal.    PANCREAS: Normal. No detectable peripancreatic collection  No definite  mass .    SPLEEN: Normal size, shape and echotexture. No focal mass     RIGHT KIDNEY: Normal. No mass. No hydronephrosis. Normal echotexture     LEFT KIDNEY: Normal. No mass. No hydronephrosis. Normal echotexture     IVC:  Normal.    ABDOMINAL AORTA: Normal.    Common bile duct 5 mm. Spleen 10 cm. Kidneys 12 cm. Liver 19 cm in  length. Upper limits of normal for the liver is 17           Impression:         1. No ascites  2. Moderate hepatomegaly    Laurena Slimmer, MD   08/20/2020 1:48 PM      .    Doctor's Notes       Patient agrees to stay in the hospital this time as she was still registered as admitted at the time of her return to the ER    Discussed case with Admitting Team and they agree with admission plan.       Pt is excluded from severe sepsis and septic shock Core Measure at 1130 and for the duration of the ED stay, up to and including the time of departure from the ED. All potential SIRS and abnormal vitals signs (including any possible hypotension during ED stay) and SEP-1-qualifying organ dysfunction on ED labs, including potential BiPAP requirement or respiratory failure, are not due to infection/sepsis, but are due to dehydration.            This note was generated by the Epic EMR system/ Dragon speech recognition and may contain inherent errors or omissions not intended by the user. Grammatical errors, random word insertions, deletions and pronoun errors  are occasional consequences of this technology due to software limitations. Not all errors are caught or corrected. If there are questions or concerns about the content of this note or information contained within the body of this dictation they should be addressed directly with the author for clarification.      Attestations:     Physician/Midlevel provider first contact with patient: 08/20/20 1102         This note is prepared for Lavonda Jumbo, MD, FACEP. The  scribe's documentation has been prepared under my direction and personally reviewed by me in its entirety.  I confirm that the note above accurately reflects all work, treatment, procedures, and medical decision making performed by me.     I am the first provider for this patient.      Lavonda Jumbo, MD, FACEP  is the primary emergency doctor of record.      I reviewed the available vital signs, nursing notes, past medical history, past surgical history, family history and social history at the time of initial evaluation.    _______________________________             Donny Pique, MD  08/21/20 [phone]

## 2020-08-20 NOTE — EDIE (Signed)
COLLECTIVE?NOTIFICATION?08/19/2020 23:57?Currie Paris?MRN: [id number]    Criteria Met      3 Different Facilities in 90 Days    5 ED Visits in 12 Months    Security and Safety  No recent Security Events currently on file    ED Care Guidelines  There are currently no ED Care Guidelines for this patient. Please check your facility's medical records system.    Flags      Negative COVID-19 Lab Result - [facility] - A specimen collected from this patient was negative for COVID-19 / Attributed By: [place] Department of Health / Attributed On: 08/12/2020       Prescription Monitoring Program  120??- Narcotic Use Score  060??- Sedative Use Score  000??- Stimulant Use Score  310??- Overdose Risk Score  - All Scores range from 000-999 with 75% of the population scoring < 200 and on 1% scoring above 650  - The last digit of the narcotic, sedative, and stimulant score indicates the number of active prescriptions of that type  - Higher Use scores correlate with increased prescribers, pharmacies, mg equiv, and overlapping prescriptions  - Higher Overdose Risk Scores correlate with increased risk of unintentional overdose death   Concerning or unexpectedly high scores should prompt a review of the PMP record; this does not constitute checking PMP for prescribing purposes.      E.D. Visit Count (12 mo.)  Facility Visits   [facility] 3   [facility] 2   [place] [facility] 3   [facility] 1   Total 9   Note: Visits indicate total known visits.     Recent Emergency Department Visit Summary  Date Facility [facility] Type Diagnoses or Chief Complaint   Aug 19, 2020 [place] [facility] [place] [facility]      difficulty breath; heart failure      Aug 10, 2020 [place] [facility] [place] [facility] [place] [facility]      sob; difficulty breathing; diabetic; chest pain      sob; difficulty breathing; diabetic; chest pain; emesis      Emesis      Chest Pain      Shortness of Breath       Acute kidney failure, unspecified      Aug 09, 2020 [place] [facility] [place] [facility] [place] [facility]      nausea      Abdominal Cramping      Emesis      Jun 22, 2020 [facility] [place] [facility]      Mental Health      Mental Health Problem      Hyperglycemia, unspecified      Major depressive disorder, single episode, unspecified      Jun 14, 2020 [facility] [place] [facility]      Dorsalgia, unspecified      Type 2 diabetes mellitus with hyperglycemia      Long term (current) use of insulin      May 29, 2020 [facility] [place] [facility]      Nondisplaced fracture of fifth metatarsal bone, left foot, initial encounter for closed fracture      Fall on same level from slipping, tripping and stumbling without subsequent striking against object, initial encounter      Oct 25, 2019 [place] [facility] [place] [facility]      EMS      Syncope  Syncope and collapse      Dizziness and giddiness      Sep 30, 2019 [place] [facility] [place] [facility]      SOB/Difficulty Breathing      Abnormal Lab      Heart failure, unspecified      Localized edema      Sep 25, 2019 [place] [facility] [place] [facility]      Chest Pain      Leg Swelling      Edema, unspecified          Recent Inpatient Visit Summary  Date Facility [facility] Type Diagnoses or Chief Complaint   Aug 10, 2020 [place] [facility] [place] [facility]      Adverse effect of unspecified drugs, medicaments and biological substances, initial encounter      Acute kidney failure, unspecified      Hyperglycemia, unspecified      Unspecified asthma with (acute) exacerbation      Dec 26, 2019 [facility] [place] 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      Heart failure, unspecified      Localized edema      Hypertensive urgency          Care Team  Provider Specialty Phone Fax Service Dates   [facility] Clinic/Center: [facility]) [phone] Current    Ricard Dillon , MD Internal Medicine [phone] Current      Collective Portal  This patient has registered at the [facility] Emergency Department   For more information visit: [email or web address]     PLEASE NOTE:     1.   Any care recommendations and other clinical information are provided as guidelines or for historical purposes only, and providers should exercise their own clinical judgment when providing care.    2.   You may only use this information for purposes of treatment, payment or health care operations activities, and subject to the limitations of applicable Collective Policies.    3.   You should consult directly with the organization that provided a care guideline or other clinical history with any questions about additional information or accuracy or completeness of information provided.    ? 2021 [place], [employer] - [email or web address]

## 2020-08-20 NOTE — EDIE (Signed)
COLLECTIVE?NOTIFICATION?08/20/2020 10:47?Currie Paris?MRN: [id number]    Criteria Met      5 ED Visits in 12 Months    3 Different Facilities in 90 Days    Security and Safety  No recent Security Events currently on file    ED Care Guidelines  There are currently no ED Care Guidelines for this patient. Please check your facility's medical records system.    Flags      Negative COVID-19 Lab Result - [facility] - A specimen collected from this patient was negative for COVID-19 / Attributed By: [place] Department of Health / Attributed On: 08/12/2020       Prescription Monitoring Program  120??- Narcotic Use Score  060??- Sedative Use Score  000??- Stimulant Use Score  310??- Overdose Risk Score  - All Scores range from 000-999 with 75% of the population scoring < 200 and on 1% scoring above 650  - The last digit of the narcotic, sedative, and stimulant score indicates the number of active prescriptions of that type  - Higher Use scores correlate with increased prescribers, pharmacies, mg equiv, and overlapping prescriptions  - Higher Overdose Risk Scores correlate with increased risk of unintentional overdose death   Concerning or unexpectedly high scores should prompt a review of the PMP record; this does not constitute checking PMP for prescribing purposes.      E.D. Visit Count (12 mo.)  Facility Visits   [facility] 3   [facility] 2   [place] [facility] 4   [facility] 1   Total 10   Note: Visits indicate total known visits.     Recent Emergency Department Visit Summary  Date Facility [facility] Type Diagnoses or Chief Complaint   Aug 20, 2020 [place] [facility] [place] [facility] [place] [facility]      anxiety      Aug 20, 2020 [place] [facility] [place] [facility] [place] [facility]      difficulty breath; heart failure      difficulty breath; heart failure; asthma      Congestive Heart Failure      Edema, unspecified      Aug 10, 2020 [place] [facility] [place] [facility] [place]  [facility]gency      sob; difficulty breathing; diabetic; chest pain      sob; difficulty breathing; diabetic; chest pain; emesis      Emesis      Chest Pain      Shortness of Breath      Acute kidney failure, unspecified      Aug 09, 2020 [place] [facility] [place] [facility] [place] [facility]      nausea      Abdominal Cramping      Emesis      Jun 22, 2020 [place] [facility]. [place] [facility]      Mental Health      Mental Health Problem      Hyperglycemia, unspecified      Major depressive disorder, single episode, unspecified      Jun 14, 2020 [facility] [place] [facility]      Dorsalgia, unspecified      Type 2 diabetes mellitus with hyperglycemia      Long term (current) use of insulin      May 29, 2020 [facility].  Emergency      Nondisplaced fracture of fifth metatarsal bone, left foot, initial encounter for closed fracture  Fall on same level from slipping, tripping and stumbling without subsequent striking against object, initial encounter      Oct 25, 2019 [place] [facility] [place] [facility]      EMS      Syncope      Syncope and collapse      Dizziness and giddiness      Sep 30, 2019 [place] [facility] [place] [facility]      SOB/Difficulty Breathing      Abnormal Lab      Heart failure, unspecified      Localized edema      Sep 25, 2019 [place] [facility] [place] [facility]      Chest Pain      Leg Swelling      Edema, unspecified          Recent Inpatient Visit Summary  Date Facility [facility] Type Diagnoses or Chief Complaint   Aug 10, 2020 [place] [facility] [place] [facility]      Adverse effect of unspecified drugs, medicaments and biological substances, initial encounter      Acute kidney failure, unspecified      Hyperglycemia, unspecified      Unspecified asthma with (acute) exacerbation      Dec 26, 2019 [facility] [place] [facility] 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      Heart failure, unspecified      Localized edema      Hypertensive urgency          Care Team  Provider Specialty Phone Fax Service Dates   [facility] Clinic/Center: [facility] [place]) [phone] Current    Ricard Dillon , MD Internal Medicine [phone] Current      Collective Portal  This patient has registered at the [facility] Emergency Department   For more information visit: [email or web address]     PLEASE NOTE:     1.   Any care recommendations and other clinical information are provided as guidelines or for historical purposes only, and providers should exercise their own clinical judgment when providing care.    2.   You may only use this information for purposes of treatment, payment or health care operations activities, and subject to the limitations of applicable Collective Policies.    3.   You should consult directly with the organization that provided a care guideline or other clinical history with any questions about additional information or accuracy or completeness of information provided.    ? 2021 [place] -  [email or web address]

## 2020-08-20 NOTE — ED Provider Notes (Signed)
EMERGENCY DEPARTMENT HISTORY AND PHYSICAL EXAM     None        Date: 08/20/2020  Patient Name: [name]    History of Presenting Illness     Chief Complaint   Patient presents with   . Congestive Heart Failure       History Provided By: patient    Chief Complaint: Leg swelling    Additional History: [name] is a 39 y.o. female presenting to the ED with moderate severity, constant, bilateral leg swelling that was gradual onset and worsening over the past few days.  Patient has associated shortness of breath and generalized fatigue.  No chest pain.  Patient is compliant with all her medications.  She has history of diastolic dysfunction with preserved ejection fraction.  On Bumex at baseline.  No fluid restrictions.  Also history of PE on Xarelto.  No missed doses of Xarelto.  No cough or hemoptysis.  Immunized against Covid.  No fever, chills, sore throat, runny nose, or cough.  Patient was hospitalized for HHS about a week ago at this hospital and received a lot of IV fluids.  She thinks she is overloaded with IV fluids and needs to have some Lasix to get the fluid off.      PCP: Nash Mantis, MD  SPECIALISTS:    Current Facility-Administered Medications   Medication Dose Route Frequency Provider Last Rate Last Admin   . acetaminophen (TYLENOL) tablet 650 mg  650 mg Oral Once PRN Maryella Shivers, MD       . ondansetron [facility]) injection 4 mg  4 mg Intravenous Once PRN Maryella Shivers, MD       . ondansetron [facility]) injection 4 mg  4 mg Intravenous Q8H PRN Maryella Shivers, MD         Current Outpatient Medications   Medication Sig Dispense Refill   . Alcohol Swabs Pads Use as indicated for insulin injections 120 each 3   . apixaban (ELIQUIS) 5 MG Take 5 mg by mouth every 12 (twelve) hours     . atorvastatin (LIPITOR) 20 MG tablet Take 20 mg by mouth nightly     . baclofen (LIORESAL) 10 MG tablet Take 10 mg by mouth as needed     . bumetanide (BUMEX) 2 MG tablet Take 1 tablet (2 mg total) by mouth daily      . carvedilol (COREG) 25 MG tablet Take 25 mg by mouth 2 (two) times daily     . gabapentin (NEURONTIN) 600 MG tablet Take 600 mg by mouth 3 (three) times daily     . hydrALAZINE (APRESOLINE) 50 MG tablet Take 50 mg by mouth 2 (two) times daily     . hydrOXYzine (ATARAX) 10 MG tablet Take 10 mg by mouth every 6 (six) hours as needed for Anxiety     . insulin detemir (Levemir) 100 UNIT/ML injection Inject 50 Units into the skin every 12 (twelve) hours 30 mL 1   . insulin lispro (HumaLOG) 100 UNIT/ML injection Inject 25 Units into the skin 3 (three) times daily before meals 30 mL 3   . Insulin Syringe-Needle U-100 27G X 1/2" 1 ML Misc Inject 1 Dose into the skin 4 (four) times daily 500 each 3   . liraglutide (VICTOZA) 18 MG/3ML injection Inject 0.6 mg into the skin daily 3 mL 2   . omeprazole (PriLOSEC) 40 MG capsule Take 40 mg by mouth nightly         Past History  Past Medical History:  Past Medical History:   Diagnosis Date   . Arthritis    . Asthma    . Chronic kidney disease    . Congestive heart failure    . Diabetes mellitus    . Fibromyalgia    . Hyperlipidemia    . Hypertension    . Neuropathy    . Pulmonary embolism 08/2019   . Vertigo        Past Surgical History:  Past Surgical History:   Procedure Laterality Date   . HYSTERECTOMY  12/2019       Family History:  Family History   Problem Relation Age of Onset   . Heart disease Mother    . Hypertension Mother    . Diabetes Mother    . Asthma Father    . Hypertension Father    . Diabetes Daughter    . Asthma Son    . Cancer Maternal Aunt    . Cancer Maternal Grandmother        Social History:  Social History     Tobacco Use   . Smoking status: Never Smoker   . Smokeless tobacco: Never Used   Substance Use Topics   . Alcohol use: Never   . Drug use: Never       Allergies:  Allergies   Allergen Reactions   . Bactrim [Sulfamethoxazole-Trimethoprim] Hives, Respiratory Distress and Edema       Review of Systems     Review of Systems: All other systems  reviewed and negative.         Physical Exam   BP 157/73   Pulse 76   Temp 98.2 F (36.8 C) (Oral)   Resp 16   Wt 143.3 kg   SpO2 99%   BMI 52.59 kg/m     Constitutional: Vital signs reviewed. Well appearing. No distress.  Head: Normocephalic, atraumatic  Eyes: Conjunctiva and sclera are normal.  No injection or discharge.  Ears, Nose, Throat:  Normal external examination of the nose and ears. No throat or oropharyngeal swelling or erythema. Midline uvula. Mucous membranes moist.  Neck: Normal range of motion. Supple, no meningeal signs. Trachea midline. No stridor. No JVD  Respiratory/Chest: Clear to auscultation. No respiratory distress.   Cardiovascular: Regular rate and rhythm. No murmurs.  Abdomen:  Bowel sounds intact. No rebound or guarding. Soft.  Non-tender.  Back: No CVA tenderness to percussion. No focal tenderness.  Upper Extremity:  No edema. No cyanosis. Bilateral radial pulses intact and equal.   Lower Extremity:  +3 pitting symmetrical bilateral lower extremity edema. No cyanosis.  Bilateral femoral, DP, PT pulses intact and equal.  Skin: Warm and dry. No rash.  Neuro: A&Ox3. CNII -XII intact to testing.    Psychiatric: Normal affect.  Normal insight.        Diagnostic Study Results     Labs -     Results     Procedure Component Value Units Date/Time    Glucose Whole Blood - POCT [[id number]]  (Abnormal) Collected: 08/20/20 0745     Updated: 08/20/20 0750     Whole Blood Glucose POCT 198 mg/dL     Glucose Whole Blood - POCT [[id number]] Collected: 08/20/20 0656     Updated: 08/20/20 0659     Whole Blood Glucose POCT 81 mg/dL     Glucose Whole Blood - POCT [[id number]]  (Abnormal) Collected: 08/20/20 0610     Updated: 08/20/20 0612     Whole Blood Glucose POCT  56 mg/dL     Troponin I [[id number]] Collected: 08/20/20 0115    Specimen: Blood Updated: 08/20/20 0157     Troponin I <0.01 ng/mL     COVID-19 (SARS-COV-2) ([facility]) [[id number]] Collected: 08/20/20 0106    Specimen: Nasopharyngeal Swab  from Nasopharynx Updated: 08/20/20 0154     Purpose of COVID testing Screening     SARS-CoV-2 Specimen Source Nasopharyngeal     SARS CoV 2 Overall Result Negative    Narrative:      o Collect and clearly label specimen type:  o Upper respiratory specimen: One Nasopharyngeal Dry Swab NO  Transport Media.  o Hand deliver to laboratory ASAP  Indication for testing->Extended care facility admission to  semi private room  Screening    B-type Natriuretic Peptide [[id number]] Collected: 08/20/20 0115    Specimen: Blood Updated: 08/20/20 0154     B-Natriuretic Peptide 15 pg/mL     Comprehensive metabolic panel [[id number]]  (Abnormal) Collected: 08/20/20 0115    Specimen: Blood Updated: 08/20/20 0150     Glucose 111 mg/dL      BUN 51.8 mg/dL      Creatinine 1.6 mg/dL      Sodium 841 mEq/L      Potassium 4.2 mEq/L      Chloride 105 mEq/L      CO2 24 mEq/L      Calcium 9.3 mg/dL      Protein, Total 6.7 g/dL      Albumin 3.1 g/dL      AST (SGOT) 12 U/L      ALT 16 U/L      Alkaline Phosphatase 89 U/L      Bilirubin, Total 0.1 mg/dL      Globulin 3.6 g/dL      Albumin/Globulin Ratio 0.9     Anion Gap 9.0    Hemolysis index [[id number]]  (Abnormal) Collected: 08/20/20 0115     Updated: 08/20/20 0150     Hemolysis Index -1    GFR [[id number]] Collected: 08/20/20 0115     Updated: 08/20/20 0150     EGFR 35.5    CBC and differential [[id number]]  (Abnormal) Collected: 08/20/20 0115    Specimen: Blood Updated: 08/20/20 0134     WBC 17.79 x10 3/uL      Hgb 7.5 g/dL      Hematocrit 06.2 %      Platelets 392 x10 3/uL      RBC 2.88 x10 6/uL      MCV 87.8 fL      MCH 26.0 pg      MCHC 29.6 g/dL      RDW 13 %      MPV 10.2 fL      Neutrophils 66.5 %      Lymphocytes Automated 21.9 %      Monocytes 7.0 %      Eosinophils Automated 2.3 %      Basophils Automated 0.4 %      Immature Granulocytes 1.9 %      Nucleated RBC 0.0 /100 WBC      Neutrophils Absolute 11.82 x10 3/uL      Lymphocytes Absolute Automated 3.90 x10 3/uL      Monocytes Absolute  Automated 1.25 x10 3/uL      Eosinophils Absolute Automated 0.41 x10 3/uL      Basophils Absolute Automated 0.08 x10 3/uL      Immature Granulocytes Absolute 0.33 x10 3/uL      Absolute NRBC 0.00 x10 3/uL  Radiologic Studies -   Radiology Results (24 Hour)     Procedure Component Value Units Date/Time    Chest AP Portable [[id number]] Collected: 08/20/20 0210    Order Status: Completed Updated: 08/20/20 0213    Narrative:      CLINICAL HISTORY:  Chest Pain    EXAMINATION:  XR CHEST AP PORTABLE    COMPARISON:  08/13/2020     FINDINGS:    Lungs: clear      Pleural space: Within normal limits.    Mediastinum: Within normal limits.    Bones: No acute abnormality.    Soft tissues: Within normal limits.        Impression:        No acute findings.    Nicki Reaper, MD   08/20/2020 2:11 AM      .    Medical Decision Making   I am the first provider for this patient.    I reviewed the vital signs, available nursing notes, past medical history, past surgical history, family history and social history.    Vital Signs-Reviewed the patient's vital signs.     Patient Vitals for the past 12 hrs:   BP Temp Pulse Resp   08/20/20 0608 157/73 98.2 F (36.8 C) 76 16   08/20/20 0417 140/63 98.2 F (36.8 C) 78 13   08/20/20 0243 125/63 -- 83 19   08/20/20 0142 138/65 98.7 F (37.1 C) 87 16   08/20/20 0008 144/74 98.7 F (37.1 C) 95 22         EKG:  Interpreted by the EP.   Time Interpreted: 100   Rate: 90   Rhythm: NSR   Interpretation: no STEMI   Comparison: 08/10/20--no sig change       ED Course:     300 - d/w cardiologist on call for Dr. Karren Burly, pt's cardiologist-- pt has not been with their group in the past year, last note in computer states her insurance doesn't work with their group.     Provider Notes: Patient presented to ED with fluid overload in the setting of receiving IV fluids for HHS during hospitalization earlier this week.  She is given 60 mg Lasix in the ED.          Diagnosis     Clinical Impression:   1.  Peripheral edema        Treatment Plan:   ED Disposition     ED Disposition Condition Date/Time Comment    Observation  Mon Aug 20, 2020  5:15 AM Admitting Physician: Marya Amsler [[id number]]   Service:: Medicine [[id number]]   Estimated Length of Stay: < 2 midnights   Tentative Discharge Plan?: Home or Self Care [1]   Does patient need telemetry?: Yes   Telemetry type (separate Telemetry order is also required):: Medical telemetry              _______________________________    This note was generated by the Epic EMR system/ Dragon speech recognition and may contain inherent errors or omissions not intended by the user. Grammatical errors, random word insertions, deletions and pronoun errors  are occasional consequences of this technology due to software limitations. Not all errors are caught or corrected. If there are questions or concerns about the content of this note or information contained within the body of this dictation they should be addressed directly with the author for clarification.      Attestations: This note is prepared by Lynnea Ferrier, MD  _______________________________       Maryella Shivers, MD  08/20/20 [phone]

## 2020-08-20 NOTE — ED Triage Notes (Signed)
[name] is a 39 y.o. female BIBA for anxiety and further care. Pt left the ER ~ 1 hr ago to go check on things with her son who is a minor. Pt says she was going to be admitted for CHF and they were going to give her lasix to take some of the fluid off of her legs. Pt was having an anxiety attack at home but has since calmed down and EMS brought her in to continue her care.

## 2020-08-21 DIAGNOSIS — R600 Localized edema: Secondary | ICD-10-CM

## 2020-08-21 DIAGNOSIS — M797 Fibromyalgia: Secondary | ICD-10-CM | POA: Diagnosis present

## 2020-08-21 DIAGNOSIS — E1140 Type 2 diabetes mellitus with diabetic neuropathy, unspecified: Secondary | ICD-10-CM | POA: Diagnosis present

## 2020-08-21 DIAGNOSIS — I509 Heart failure, unspecified: Secondary | ICD-10-CM

## 2020-08-21 DIAGNOSIS — I10 Essential (primary) hypertension: Secondary | ICD-10-CM

## 2020-08-21 LAB — URINALYSIS WITH MICROSCOPIC
Bilirubin, UA: NEGATIVE
Blood, UA: NEGATIVE
Glucose, UA: 50 — AB
Ketones UA: NEGATIVE
Leukocyte Esterase, UA: NEGATIVE
Nitrite, UA: NEGATIVE
Protein, UR: 100 — AB
Specific Gravity UA: 1.009 (ref 1.001–1.035)
Urine pH: 6 (ref 5.0–8.0)
Urobilinogen, UA: NEGATIVE mg/dL (ref 0.2–2.0)

## 2020-08-21 LAB — BASIC METABOLIC PANEL
Anion Gap: 10 (ref 5.0–15.0)
BUN: 52 mg/dL — ABNORMAL HIGH (ref 7.0–19.0)
CO2: 23 mEq/L (ref 22–29)
Calcium: 8.6 mg/dL (ref 8.5–10.5)
Chloride: 104 mEq/L (ref 100–111)
Creatinine: 1.6 mg/dL — ABNORMAL HIGH (ref 0.6–1.0)
Glucose: 278 mg/dL — ABNORMAL HIGH (ref 70–100)
Potassium: 4.9 mEq/L (ref 3.5–5.1)
Sodium: 137 mEq/L (ref 136–145)

## 2020-08-21 LAB — IRON PROFILE
Iron Saturation: 21 % (ref 15–50)
Iron: 47 ug/dL (ref 40–145)
TIBC: 227 ug/dL — ABNORMAL LOW (ref 265–497)
UIBC: 180 ug/dL (ref 126–382)

## 2020-08-21 LAB — CBC
Absolute NRBC: 0 10*3/uL (ref 0.00–0.00)
Hematocrit: 26 % — ABNORMAL LOW (ref 34.7–43.7)
Hgb: 7.8 g/dL — ABNORMAL LOW (ref 11.4–14.8)
MCH: 26.1 pg (ref 25.1–33.5)
MCHC: 30 g/dL — ABNORMAL LOW (ref 31.5–35.8)
MCV: 87 fL (ref 78.0–96.0)
MPV: 9.9 fL (ref 8.9–12.5)
Nucleated RBC: 0 /100 WBC (ref 0.0–0.0)
Platelets: 355 10*3/uL — ABNORMAL HIGH (ref 142–346)
RBC: 2.99 10*6/uL — ABNORMAL LOW (ref 3.90–5.10)
RDW: 14 % (ref 11–15)
WBC: 11.76 10*3/uL — ABNORMAL HIGH (ref 3.10–9.50)

## 2020-08-21 LAB — ECG 12-LEAD
Atrial Rate: 82 {beats}/min
Atrial Rate: 90 {beats}/min
P Axis: 38 degrees
P Axis: 57 degrees
P-R Interval: 148 ms
P-R Interval: 160 ms
Q-T Interval: 366 ms
Q-T Interval: 394 ms
QRS Duration: 82 ms
QRS Duration: 86 ms
QTC Calculation (Bezet): 447 ms
QTC Calculation (Bezet): 460 ms
R Axis: -4 degrees
R Axis: 11 degrees
T Axis: 35 degrees
T Axis: 78 degrees
Ventricular Rate: 82 {beats}/min
Ventricular Rate: 90 {beats}/min

## 2020-08-21 LAB — VITAMIN D,25 OH,TOTAL: Vitamin D, 25 OH, Total: 13 ng/mL — ABNORMAL LOW (ref 30–100)

## 2020-08-21 LAB — PTH, INTACT: PTH Intact: 130.7 pg/mL — ABNORMAL HIGH (ref 17.7–84.5)

## 2020-08-21 LAB — HEMOLYSIS INDEX
Hemolysis Index: 3 (ref 0–18)
Hemolysis Index: 1 (ref 0–24)

## 2020-08-21 LAB — GLUCOSE WHOLE BLOOD - POCT
Whole Blood Glucose POCT: 235 mg/dL — ABNORMAL HIGH (ref 70–100)
Whole Blood Glucose POCT: 148 mg/dL — ABNORMAL HIGH (ref 70–100)
Whole Blood Glucose POCT: 259 mg/dL — ABNORMAL HIGH (ref 70–100)
Whole Blood Glucose POCT: 344 mg/dL — ABNORMAL HIGH (ref 70–100)

## 2020-08-21 LAB — GFR: EGFR: 35.5

## 2020-08-21 LAB — FERRITIN: Ferritin: 192.4 ng/mL (ref 4.60–204.00)

## 2020-08-21 MED ORDER — INSULIN LISPRO 100 UNIT/ML SC SOLN
2.0000 [IU] | Freq: Three times a day (TID) | SUBCUTANEOUS | Status: DC
Start: 2020-08-21 — End: 2020-08-22
  Administered 2020-08-21: 18:00:00 6 [IU] via SUBCUTANEOUS
  Administered 2020-08-21 – 2020-08-22 (×2): 4 [IU] via SUBCUTANEOUS
  Filled 2020-08-21: qty 18
  Filled 2020-08-21: qty 12
  Filled 2020-08-21: qty 75
  Filled 2020-08-21: qty 12

## 2020-08-21 MED ORDER — BUMETANIDE 2 MG PO TABS
2.0000 mg | ORAL_TABLET | Freq: Two times a day (BID) | ORAL | 0 refills | Status: DC
Start: 2020-08-21 — End: 2022-12-13

## 2020-08-21 MED ORDER — INSULIN LISPRO 100 UNIT/ML SC SOLN
1.0000 [IU] | Freq: Every evening | SUBCUTANEOUS | Status: DC
Start: 2020-08-21 — End: 2020-08-22
  Administered 2020-08-21: 21:00:00 5 [IU] via SUBCUTANEOUS
  Filled 2020-08-21: qty 12

## 2020-08-21 MED ORDER — ACETAMINOPHEN 325 MG PO TABS
650.0000 mg | ORAL_TABLET | Freq: Four times a day (QID) | ORAL | Status: DC | PRN
Start: 2020-08-21 — End: 2020-08-22
  Administered 2020-08-21: 650 mg via ORAL

## 2020-08-21 MED ORDER — INSULIN LISPRO 100 UNIT/ML SC SOLN
25.0000 [IU] | Freq: Three times a day (TID) | SUBCUTANEOUS | Status: DC
Start: 2020-08-21 — End: 2020-08-21

## 2020-08-21 MED ORDER — FUROSEMIDE 40 MG PO TABS
40.0000 mg | ORAL_TABLET | Freq: Every day | ORAL | Status: DC
Start: 2020-08-21 — End: 2020-08-21
  Filled 2020-08-21: qty 1

## 2020-08-21 MED ORDER — HYDRALAZINE HCL 50 MG PO TABS
50.0000 mg | ORAL_TABLET | Freq: Two times a day (BID) | ORAL | Status: DC
Start: 2020-08-22 — End: 2020-08-22
  Administered 2020-08-22: 10:00:00 50 mg via ORAL
  Filled 2020-08-21: qty 1

## 2020-08-21 MED ORDER — FUROSEMIDE 10 MG/ML IJ SOLN
40.0000 mg | Freq: Two times a day (BID) | INTRAMUSCULAR | Status: DC
Start: 2020-08-21 — End: 2020-08-21
  Administered 2020-08-21: 14:00:00 40 mg via INTRAVENOUS
  Filled 2020-08-21: qty 4

## 2020-08-21 MED ORDER — BUMETANIDE 1 MG PO TABS
2.0000 mg | ORAL_TABLET | Freq: Two times a day (BID) | ORAL | Status: DC
Start: 2020-08-21 — End: 2020-08-22
  Administered 2020-08-21 – 2020-08-22 (×2): 2 mg via ORAL
  Filled 2020-08-21 (×4): qty 2

## 2020-08-21 MED ORDER — SODIUM CHLORIDE 0.9 % IV SOLN
300.0000 mg | Freq: Every evening | INTRAVENOUS | Status: DC
Start: 2020-08-21 — End: 2020-08-22
  Administered 2020-08-21 – 2020-08-22 (×2): 300 mg via INTRAVENOUS
  Filled 2020-08-21 (×2): qty 15

## 2020-08-21 MED ORDER — INSULIN LISPRO 100 UNIT/ML SC SOLN
30.0000 [IU] | Freq: Three times a day (TID) | SUBCUTANEOUS | Status: DC
Start: 2020-08-21 — End: 2020-08-21

## 2020-08-21 NOTE — Plan of Care (Signed)
Problem: Safety  Goal: Patient will be free from injury during hospitalization  Outcome: Progressing  Flowsheets (Taken 08/21/2020 0213 by Dorena Dew, RN)  Patient will be free from injury during hospitalization:   Assess patient's risk for falls and implement fall prevention plan of care per policy   Provide and maintain safe environment   Use appropriate transfer methods   Ensure appropriate safety devices are available at the bedside  Goal: Patient will be free from infection during hospitalization  Outcome: Progressing  Flowsheets (Taken 08/21/2020 1205)  Free from Infection during hospitalization:   Assess and monitor for signs and symptoms of infection   Monitor lab/diagnostic results   Encourage patient and family to use good hand hygiene technique     Problem: Pain  Goal: Pain at adequate level as identified by patient  Outcome: Progressing  Flowsheets (Taken 08/21/2020 0213 by Dorena Dew, RN)  Pain at adequate level as identified by patient:   Identify patient comfort function goal   Assess for risk of opioid induced respiratory depression, including snoring/sleep apnea. Alert healthcare team of risk factors identified.   Evaluate if patient comfort function goal is met   Evaluate patient's satisfaction with pain management progress     Problem: Side Effects from Pain Analgesia  Goal: Patient will experience minimal side effects of analgesic therapy  Outcome: Progressing  Flowsheets (Taken 08/21/2020 1205)  Patient will experience minimal side effects of analgesic therapy:   Monitor/assess patient's respiratory status (RR depth, effort, breath sounds)   Assess for changes in cognitive function     Problem: Discharge Barriers  Goal: Patient will be discharged home or other facility with appropriate resources  Outcome: Progressing  Flowsheets (Taken 08/21/2020 1205)  Discharge to home or other facility with appropriate resources:   Provide appropriate patient education   Provide information on  available health resources     Problem: Psychosocial and Spiritual Needs  Goal: Demonstrates ability to cope with hospitalization/illness  Outcome: Progressing  Flowsheets (Taken 08/21/2020 1205)  Demonstrates ability to cope with hospitalizations/illness:   Encourage verbalization of feelings/concerns/expectations   Provide quiet environment   Communicate referral to spiritual care as appropriate   Encourage participation in diversional activity     Problem: Day of Admission - Heart Failure  Goal: Heart Failure Admission  Outcome: Progressing  Flowsheets (Taken 08/21/2020 0214 by Dorena Dew, RN)  Heart Failure Admission:   Standing Weight on admission, if unable to stand zero the bed and use the bed scale   Vital signs and telemetry per policy   Strict Intake/Output   Assess for swelling/edema and document     Problem: Everyday - Heart Failure  Goal: Stable Vital Signs and Fluid Balance  Outcome: Progressing  Flowsheets (Taken 08/21/2020 1205)  Stable Vital Signs and Fluid Balance:   Daily Standing Weights in the morning using the same scale, after using the bathroom and before breadfast.  If unable to stand, zero the bed and use the bed scale   Monitor, assess vital signs and telemetry per policy   Fluid Restriction   Assess for swelling/edema   Strict Intake/Output   Monitor labs and report abnormalities to physician  Goal: Mobility/Activity is Maintained at Optimal Level for Patient  Outcome: Progressing  Flowsheets (Taken 08/21/2020 1205)  Mobility/Activity is Maintained at Optimal Level for Patient:   Increase mobility as tolerated/progressive mobility protocol   Reposition patient every 2 hours and as needed unless able to reposition self  Goal: Nutritional Intake is Adequate  Outcome: Progressing  Flowsheets (Taken 08/21/2020 1205)  Nutritional Intake is Adequate:   Fluid Restricction if needed   Cardiac diet-2 gm Sodium  Goal: Teaching-Using CHF Warning Zones and Educational Videos  Outcome:  Progressing  Flowsheets (Taken 08/21/2020 1205)  Teaching-Using CHF Warning Zones and Educational Videos: Signs & Symptoms of CHF     Problem: Day of Discharge - Heart Failure  Goal: Discharge Education  Outcome: Progressing  Flowsheets (Taken 08/21/2020 1205)  Day of Discharge: Fluid Restriction

## 2020-08-21 NOTE — Consults (Signed)
Nutritional Support Services  Nutrition Education    Referral Source: NP consult  Reason for Referral: unintentional weight  gain    Who was educated: patient    Readiness to learn:  Health and safety inspector    Education Details: Pt seen alert at bedside for  Heart failure nutrition education session. Pt reports recent 20# weight gain x 6 days. Provided and reviewed heart failure nutrition therapy handout with patient. Discussed foods and beverages that contribute towards fluid intake (ice, soup, watermelon, etc.). Pt states the fluid restriction will be difficult as she eats a lot of foods with high fluid content (gravy, watermelon). Discussed rationale for limiting sodium intake and provide list of foods recommended and not recommended. RD contact information provided on handout. Pt verbalizes understanding of handout and is able to teach back information. Moderate compliance expected d/t current food choices.     Pt voicing concerns about current insulin regimen and states she typically gives herself much higher insulin doses than she is currently receiving. Pt states she manages blood sugar levels in the 100s at home. Current blood sugars trending >200. Patient's concerns relayed to RN and MD via secure chat.    Type of Instruction Provided:  Verbal & Written      Level of Understanding:  Verbalizes understanding    Will follow-up prn.   RD contact information provided.    Marlyne Beards, RDN  Ext. [phone]

## 2020-08-21 NOTE — Progress Notes (Signed)
[facility], [employer].                                                                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   Presents with fluid overload and 20 pound weight gain in 1 week; was recently hospitalized for HHS and aggressively hydrated.   Acute diastolic CHF   Untreated OSA   HTN   Anemia of chronic illness and iron deficiency   Type 2 diabetes, recent admission with HHS      Recommendations:   1. Continue home diuretic of Bumex 2 mg twice daily  2. I have reordered Daily standing weights  3. Decrease hydralazine frequency to twice a day  4. IV iron for iron deficiency.  Received Aranesp yesterday      Labs:   Recent Labs   Lab 08/21/20  0506 08/20/20  0115   Sodium 137 138   Potassium 4.9 4.2   Chloride 104 105   CO2 23 24   BUN 52.0* 60.0*   Creatinine 1.6* 1.6*   Glucose 278* 111*   Calcium 8.6 9.3   EGFR 35.5 35.5     Recent Labs   Lab 08/21/20  1150 08/20/20  0115   WBC 11.76* 17.79*   Hgb 7.8* 7.5*   Hematocrit 26.0* 25.3*   Platelets 355* 392*       Recent Labs   Lab 08/21/20  0506   Ferritin 192.40     Recent Labs   Lab 08/21/20  0506   Iron 47   TIBC 227*     Recent Labs   Lab 08/21/20  1240   Color, UA Straw   Specific Gravity UA 1.009   Urine pH 6.0   Leukocyte Esterase, UA Negative   Nitrite, UA Negative   Protein, UR 100*   Glucose, UA 50*   Ketones UA Negative   Urobilinogen, UA Negative   Bilirubin, UA Negative   Blood, UA Negative   RBC, UA 0 - 2   Squamous Epithelial Cells, Urine 0 - 5         Medications:      Scheduled Meds:     apixaban, 5 mg, Oral, Q12H SCH  atorvastatin, 20 mg, Oral, QHS  bumetanide, 2 mg, Oral, BID  carvedilol, 25 mg, Oral, BID  gabapentin, 600 mg, Oral, TID  hydrALAZINE, 50 mg, Oral, BID  insulin glargine, 20 Units, Subcutaneous, Q12H  insulin lispro, 1-6 Units, Subcutaneous, QHS  insulin  lispro, 2-10 Units, Subcutaneous, TID AC  pantoprazole, 40 mg, Oral, QAM AC          Continuous Infusions:        PRN Meds:acetaminophen, baclofen, Nursing communication: Adult Hypoglycemia Treatment Algorithm **AND** dextrose **AND** dextrose **AND** glucagon (rDNA), hydrOXYzine    Subjective:   No new  complaints.    No SOB  No Chest pain  No Abdominal pain  No Nausea/Vomiting, diarrhea    Objective:   Vital signs in last 24 hours:  Temp:  [98.1 F (36.7 C)-99 F (37.2 C)] 98.3 F (36.8 C)  Heart Rate:  [81-100] 86  Resp Rate:  [14-19] 18  BP: (105-133)/(61-83) 105/69    Intake/Output:   Intake/Output last 24 hours:    Intake/Output Summary (Last 24 hours) at 08/21/2020 1530  Last data filed at 08/21/2020 1134  Gross per 24 hour   Intake 740 ml   Output --   Net 740 ml     Intake/Output this shift:  I/O this shift:  In: 240 [P.O.:240]  Out: -      General:    In NAD          CVS:  RRR, no rub        Chest:  CTA/Bilaterally  Abdomen:   Soft, nontender           Ext.:   2+ edema      Dione Housekeeper, MD  [facility]  Office [phone]

## 2020-08-21 NOTE — Plan of Care (Signed)
Patient is alert and oriented x4.   Independent with ambulation.  Tylenol given for headache  BS with sliding scale. Patient is requesting home's dose of insulin. PA made aware, will be followed by primary team in the morning.    Problem: Day of Admission - Heart Failure  Goal: Heart Failure Admission  Outcome: Progressing  Flowsheets (Taken 08/21/2020 0214)  Heart Failure Admission:  . Standing Weight on admission, if unable to stand zero the bed and use the bed scale  . Vital signs and telemetry per policy  . Strict Intake/Output  . Assess for swelling/edema and document     Problem: Pain  Goal: Pain at adequate level as identified by patient  Outcome: Progressing  Flowsheets (Taken 08/21/2020 0213)  Pain at adequate level as identified by patient:  . Identify patient comfort function goal  . Assess for risk of opioid induced respiratory depression, including snoring/sleep apnea. Alert healthcare team of risk factors identified.  . Evaluate if patient comfort function goal is met  . Evaluate patient's satisfaction with pain management progress     Problem: Safety  Goal: Patient will be free from injury during hospitalization  Outcome: Progressing  Flowsheets (Taken 08/21/2020 0213)  Patient will be free from injury during hospitalization:  . Assess patient's risk for falls and implement fall prevention plan of care per policy  . Provide and maintain safe environment  . Use appropriate transfer methods  . Ensure appropriate safety devices are available at the bedside

## 2020-08-21 NOTE — UM Notes (Signed)
Primary Coverage: MEDICAID HMO/[employer] HMO      08/20/20 1120    Admit to Observation Once   Diagnosis: Chf Nyha Class III, Acute, Combined   Level of Care: Intermediate Care   Patient Class: Observation     H&P: 39 y.o.femalewith medical history of asthma,D- CHF,PE on Eliquis, Anemia resulting from heparin drip started for PE leading to 6 units of PRBCs, uterine angiography/embolization,HHS, hypertension, hyperlipidemia, diabetes, morbid obesity and GERDwho presented with bilateral lower extremity edema, increasing abdominal girth, weight gain of 20 pounds in 6 days, shortness of breath on exertion and fatigue.  Patient states she has been gaining weight since discharge after receiving multiple volumes of fluids while in ICU, she has been taking her Bumex 2 mg daily with no improvement and has been restricting her fluid intake. She reports history of being on Lasix drip due to edema at [facility] with improvement and was requesting to be on Lasix drip.  Nephrology consulted to assist with her volume management in view of CKD.  She also reports intermittent abdominal discomfort.  She denies chest pain, lightheadedness, shortness of breath at rest, orthopnea, PND, URI symptoms, urinary symptoms.  She endorses minimal urine output in the past few days, generalized body aches due to fibromyalgia and peripheral neuropathy due to diabetes.    In the emergency room, she received 60 mg of IV Lasix with recommendations for admission to continue IV diuretic.  Her EKG showed normal sinus rhythm at 90 with no STEMI, chest x-ray is normal, BNP is normal, H&H noted at 7.5/25.3, BUN/creatinine of 60/1.6.  Nephrology Dr. Verdell Face consulted.  Abdominal ultrasound showed no ascites, moderate hepatomegaly.    ED VS:   98.7 F (37.1 C) -- 95 98 % -- 22 144/74     VS range within the past 24 hrs  Temp:  [98.1 F (36.7 C)-99 F (37.2 C)] 98.5 F (36.9 C)  Heart Rate:  [80-100] 86  Resp Rate:  [14-19]  17  BP: (108-138)/(58-83) 132/83      ED Labs:   08/20/2020 01:15 08/20/2020 06:10 08/20/2020 06:56 08/20/2020 07:45 08/20/2020 14:46 08/20/2020 17:15 08/20/2020 20:34 08/20/2020 23:36 08/21/2020 05:06 08/21/2020 08:00   WBC 17.79 (H)            Hemoglobin 7.5 (L)            Hematocrit 25.3 (L)            Platelet Count 392 (H)            RBC 2.88 (L)            MCHC 29.6 (L)            Lymphocytes Absolute Automated 3.90 (H)            Monocytes Absolute Automated 1.25 (H)            Immature Granulocytes Absolute 0.33 (H)            Neutrophils # POCT 11.82 (H)            Glucose 111 (H)        278 (H)    Whole Blood Glucose POCT  56 (L) 81 198 (H) 120 (H) 201 (H) 269 (H) 269 (H)  235 (H)   BUN 60.0 (H)        52.0 (H)    Creatinine 1.6 (H)        1.6 (H)    Sodium 138  137    Chloride 105        104    CO2 24        23    Anion Gap 9.0        10.0    Albumin 3.1 (L)            Globulin 3.6            Bilirubin Total 0.1 (L)                Imaging:  US Abdomen Complete (Final result)  Result time 08/20/20 13:48:00  Final result by Sharlet Salina, MD (08/20/20 13:48:00)                Impression:       1. No ascites   2. Moderate hepatomegaly               Medication Administration from 08/20/2020 1046 to 08/20/2020 1832     Date/Time Order Dose Route Action    08/20/2020 1440 gabapentin (NEURONTIN) capsule 600 mg 600 mg Oral Given    08/20/2020 1727 insulin glargine (LANTUS) injection 20 Units 20 Units Subcutaneous Given    08/20/2020 1729 hydrALAZINE (APRESOLINE) tablet 50 mg 50 mg Oral Given    08/20/2020 1729 carvedilol (COREG) tablet 25 mg 25 mg Oral Given    08/20/2020 1302 apixaban (ELIQUIS) tablet 5 mg 5 mg Oral Given    08/20/2020 1440 furosemide (LASIX) injection 40 mg 40 mg Intravenous Given     Assessment:   -Volume overload in setting of diastolic heart failure and acute on chronic kidney failure  -Acute on chronic kidney failure with baseline creatinine of 1.6-1.7  -Hypertension  -Anemia due to  chronic disease  -Type 2 diabetes with history of recent admission 08/11/20 to 08/13/20 for HHS status post fluid boluses  -History of diabetic neuropathy and fibromyalgia  -Morbid obesity with BMI of 51.00 kg/M2  -History of asthma    Plan:   39 year old [place] American female with history of recent hospitalization for HHS received IV fluids and blood products (Vaginal bleeding s/p Angigraphy/Embolization) came to the emergency room for worsening weight gain of 20 lbs in 6 days and DOE, admitted for diuresis 2/2 diastolic dysfunction.    -Patient received 60 mg of IV Lasix in the ED, will continue Lasix at 40 twice daily  -Strict I's and O's with fluid restriction at 1800 mL / 24 hours  -Daily weight  -Last echocardiogram showed diastolic dysfunction with preserved ejection fraction  -Nutritionist consult for dietary education  -Advised diet and exercise for weight management as well as controlling her blood sugars  -Nephrology is on board  -Agree with screening urinalysis  -Agree with checking iron profile and possible Aranesp  -Avoid nephrotoxic drugs and renally dose medications, and closely monitor renal panel  -We will resume home Eliquis, carvedilol, hydralazine, Neurontin and as needed baclofen.  -Add correctional insulin, resume home Lantus at a lower dose of 20 units twice daily  -Patient's hemoglobin A1c noted at 8.8.  -Patient had a sleep study done on 08/22/2013 that was negative for the presence of obstructive sleep apnea / hypopnea syndrome   -We will continue to hold respiratory agents per nephrology request while diuresing.    Nephrology:  Impression:    CKD 3B/4, baseline creatinine CR around 2, varies volume status.   Presents with fluid overload partly due to recent hospitalization for HHS and aggressive hydration   Possible CHF   HTN  Anemia of chronic illness   Type 2 diabetes, recent admission with HHS    Unremarkable 5.9 cm, left kidney 9.6 cm, no hydronephrosis no stones or mass.   Prevoid bladder volume 35 mL, PVR 3 cc.    Recommendations:    2 g sodium and 1800 mL fluid restriction.   Continue IV Lasix   Screening urinalysis and quantification of proteinuria   Hold respiratory agents during attempts at aggressive diuresis   Check iron stores and give Aranesp   Additional CKD work-up including PTH and vitamin D      Intake/Output Summary (Last 24 hours) at 08/21/2020 1045  Last data filed at 08/21/2020 0330  Gross per 24 hour   Intake 500 ml   Output --   Net 500 ml         Scheduled Meds:  Current Facility-Administered Medications   Medication Dose Route Frequency   . apixaban  5 mg Oral Q12H SCH   . atorvastatin  20 mg Oral QHS   . carvedilol  25 mg Oral BID   . furosemide  40 mg Intravenous BID   . gabapentin  600 mg Oral TID   . hydrALAZINE  50 mg Oral BID   . insulin glargine  20 Units Subcutaneous Q12H   . insulin lispro  1-6 Units Subcutaneous QHS   . insulin lispro  2-10 Units Subcutaneous TID AC   . pantoprazole  40 mg Oral QAM AC     Continuous Infusions:  PRN Meds:.acetaminophen, baclofen, Nursing communication: Adult Hypoglycemia Treatment Algorithm **AND** dextrose **AND** dextrose **AND** glucagon (rDNA), hydrOXYzine        UTILIZATION REVIEW CONTACT: Earlene Plater, UR RN  Clinical case manager- Utilization review  NPI: [id number]  Tax ID: [id number]  Phone: [phone]  Fax: [phone]  Email: [email or web address]

## 2020-08-21 NOTE — Discharge Summary (Signed)
[facility]      Patient: [name]  Admission Date: 08/20/2020   DOB: 03/08/1981  Discharge Date: 08/21/2020    MRN: [id number]  Discharge Attending: Dr.Morales, Pilar Plate, NP     Referring Physician: Nash Mantis, MD  PCP: Nash Mantis, MD       DISCHARGE SUMMARY     Discharge Information   Admission Diagnosis:   CHF NYHA class III, acute, combined    Discharge Diagnosis:   Active Hospital Problems    Diagnosis   . Morbid obesity   . Hypertension   . Fibromyalgia   . Diabetic neuropathy associated with type 2 diabetes mellitus   . CHF NYHA class III, acute, combined   . Peripheral edema   . Acute renal failure superimposed on stage 3 chronic kidney disease        Admission Condition: Poor  Discharge Condition: Stable  Consultants:Nephrology and cardiology  Functional Status: Ambulatory  Discharged to:  Home    Discharge Medications:     Medication List      CHANGE how you take these medications    bumetanide 2 MG tablet  Commonly known as: BUMEX  Take 1 tablet (2 mg total) by mouth 2 (two) times daily  What changed: when to take this        CONTINUE taking these medications    albuterol sulfate HFA 108 (90 Base) MCG/ACT inhaler  Commonly known as: PROVENTIL     Alcohol Swabs Pads  Use as indicated for insulin injections     apixaban 5 MG  Commonly known as: ELIQUIS     atorvastatin 20 MG tablet  Commonly known as: LIPITOR     baclofen 10 MG tablet  Commonly known as: LIORESAL     carvedilol 25 MG tablet  Commonly known as: COREG     folic acid 1 MG tablet  Commonly known as: FOLVITE     gabapentin 600 MG tablet  Commonly known as: NEURONTIN     hydrALAZINE 50 MG tablet  Commonly known as: APRESOLINE     hydrOXYzine 10 MG tablet  Commonly known as: ATARAX     insulin lispro 100 UNIT/ML injection  Commonly known as: HumaLOG  Inject 25 Units into the skin 3 (three) times daily before meals     Insulin Syringe-Needle U-100 27G X 1/2" 1 ML Misc  Inject 1 Dose into the skin 4 (four) times  daily     Levemir 100 UNIT/ML injection  Generic drug: insulin detemir  Inject 50 Units into the skin every 12 (twelve) hours     liraglutide 18 MG/3ML injection  Commonly known as: VICTOZA  Inject 0.6 mg into the skin daily     montelukast 10 MG tablet  Commonly known as: SINGULAIR     NON FORMULARY     omeprazole 40 MG capsule  Commonly known as: PriLOSEC        STOP taking these medications    aspirin EC 81 MG EC tablet           Where to Get Your Medications      These medications were sent to [id number], [place] - [address]  [address] [place], [place] [address]    Hours: 24-hours Phone: [phone]    bumetanide 2 MG tablet             Hospital Course   Presentation History   [name] a 39 y.o.femalewith medical history of asthma,D- CHF,PE on  Eliquis, Anemia resulting from heparin drip started for PE leading to 6 units of PRBCs, uterine angiography/embolization,HHS, hypertension, hyperlipidemia, diabetes, morbid obesity and GERDwho presented with bilateral lower extremity edema, increasing abdominal girth, weight gain of 20 pounds in 6 days, shortness of breath on exertion and fatigue.  Patient states she has been gaining weight since discharge after receiving multiple volumes of fluids while in ICU, she has been taking her Bumex 2 mg daily with no improvement and has been restricting her fluid intake. She reports history of being on Lasix drip due to edema at [facility] with improvement and was requesting to be on Lasix drip.  Nephrology consulted to assist with her volume management in view of CKD.  She also reports intermittent abdominal discomfort.  She denies chest pain, lightheadedness, shortness of breath at rest, orthopnea, PND, URI symptoms, urinary symptoms.  She endorses minimal urine output in the past few days, generalized body aches due to fibromyalgia and peripheral neuropathy due to diabetes.    See HPI for details.    Hospital Course (0 Days)    39 year old African-American female with a past medical history of diastolic heart failure, pulmonary emboli on Eliquis, asthma and anemia who came to emergency room with worsening lower extremity edema associated with shortness of breath and fatigue, admitted for CHF exacerbation. Patient started on IV Lasix 40 mg twice daily, her kidney function noted with creatinine of 1.6 and BUN of 60, her kidney Ultrasound is unremarkable 5.9 cm, left kidney 9.6 cm, no hydronephrosis no stones or mass. Nephrology consulted in view of continuing diuresis in CKD 3B/4 and Anemia. Patient received iron Venofer X3. Patient will be discharged to continue Bumex 2 mg twice daily, fluid restrictions to 1800 mL daily with 2 g sodium diet as well as follow-up with nephrology outpatient.  Cardiology consulted in view of CHF and recurrent admission for fluid overload. Patient is feeling much better, will continue diuresis via IV while in house. Likely d/c in 1-2 days once sob and edema mproves and clear by Cardiology. Please see H+P, consults and progress Motes for detailed information.        Procedures/Imaging:   Upon my review:   US Abdomen Complete    Result Date: 08/20/2020   1. No ascites 2. Moderate hepatomegaly Laurena Slimmer, MD  08/20/2020 1:48 PM    Chest AP Portable    Result Date: 08/20/2020  No acute findings. Nicki Reaper, MD  08/20/2020 2:11 AM         Best Practices   Was the patient admitted with either a CHF Exacerbation or Pneumonia? No     Progress Note/Physical Exam at Discharge     Subjective:  States she feels better today, sob and edema improving, no other complaints, awaiting Cardiology recommendations     Vitals:    08/21/20 0800 08/21/20 0912 08/21/20 1134 08/21/20 1500   BP: 128/80 132/83 105/69    Pulse: 81 86     Resp: 17  18    Temp: 98.5 F (36.9 C)  98.3 F (36.8 C)    TempSrc: Oral  Oral    SpO2:   100%    Weight:    141.2 kg (311 lb 4.6 oz)   Height:          General: Obese, non-toxic, in NAD,  AAOx3  HEENT: perrla, eomi, sclera anicteric, OP: Clear, MMM  Neck: supple, No JVD  Cardiovascular: RRR, no m/r/g  Lungs: CTAB, no w/r/r  Abdomen: soft, +BS,  NT/ND, no masses, no g/r  Extremities: no C/C, + 2+ edema BLE  Skin: no rashes or lesions noted  Neuro: CN 2-12 intact; No Focal neurological deficits       Diagnostics     Labs/Studies Pending at Discharge: No    Last Labs   Recent Labs   Lab 08/21/20  1150 08/20/20  0115   WBC 11.76* 17.79*   RBC 2.99* 2.88*   Hgb 7.8* 7.5*   Hematocrit 26.0* 25.3*   MCV 87.0 87.8   Platelets 355* 392*       Recent Labs   Lab 08/21/20  0506 08/20/20  0115   Sodium 137 138   Potassium 4.9 4.2   Chloride 104 105   CO2 23 24   BUN 52.0* 60.0*   Creatinine 1.6* 1.6*   Glucose 278* 111*   Calcium 8.6 9.3       Microbiology Results (last 15 days)     Procedure Component Value Units Date/Time    COVID-19 (SARS-COV-2) Verne Carrow Rapid) [[id number]] Collected: 08/20/20 0106    Order Status: Completed Specimen: Nasopharyngeal Swab from Nasopharynx Updated: 08/20/20 0154     Purpose of COVID testing Screening     SARS-CoV-2 Specimen Source Nasopharyngeal     SARS CoV 2 Overall Result Negative     Comment: Test performed using the Abbott ID NOW EUA assay.  Please see Fact Sheets for patients and providers located at:  http://www.rice.biz/    This test is for the qualitative detection of SARS-CoV-2  (COVID19) nucleic acid. Viral nucleic acids may persist in vivo,  independent of viability. Detection of viral nucleic acid does  not imply the presence of infectious virus, or that virus  nucleic acid is the cause of clinical symptoms. Negative  results should be treated as presumptive and, if inconsistent  with clinical signs and symptoms or necessary for patient  management, should be tested with an alternative molecular  assay. Negative results do not preclude SARS-CoV-2 infection  and should not be used as the sole basis for patient  management decisions. Invalid results may  be due to inhibiting  substances in the specimen and recollection should occur.         Narrative:      o Collect and clearly label specimen type:  o Upper respiratory specimen: One Nasopharyngeal Dry Swab NO  Transport Media.  o Hand deliver to laboratory ASAP  Indication for testing->Extended care facility admission to  semi private room  Screening    MRSA culture - Nares (If not done in triage) [[id number]] Collected: 08/11/20 2048    Order Status: Completed Specimen: Culturette from Nares Updated: 08/12/20 2120     Culture MRSA Surveillance Negative for Methicillin Resistant Staph aureus    MRSA culture - Throat (If not done in triage) [[id number]]  (Abnormal) Collected: 08/11/20 2048    Order Status: Completed Specimen: Culturette from Throat Updated: 08/12/20 2128     Culture MRSA Surveillance Positive for Methicillin Resistant Staph aureus    Narrative:      [id number] called Micro Results of pos mrsa cx Results read back by:[id number], by  [id number] on 08/12/2020 at 21:28    RSV screen [[id number]] Collected: 08/11/20 0747    Order Status: Completed Specimen: Nasopharyngeal from Nasal Aspirate Updated: 08/11/20 0833    Narrative:      ORDER#: [id number]  ORDERED BY: JABBAR, LUBNA  SOURCE: Nasal Aspirate                               COLLECTED:  08/11/20 07:47  ANTIBIOTICS AT COLL.:                                RECEIVED :  08/11/20 07:53  RSV, Rapid Antigen                         FINAL       08/11/20 08:33  08/11/20   Negative for RSV Antigen             Reference Range: Negative      COVID-19 (SARS-COV-2) and Influenza Raina Mina Rapid) [[id number]] Collected: 08/10/20 2159    Order Status: Completed Specimen: Culturette from Nasopharyngeal Updated: 08/10/20 2242     Purpose of COVID testing Diagnostic -PUI     SARS-CoV-2 Specimen Source Nasopharyngeal     SARS CoV 2 Overall Result Not Detected     Comment: Test performed using the Roche cobas Liat SARS-CoV-2 assay. This assay is  only for use  under the Food and Drug Administration's Emergency Use  Authorization. This is a real-time RT-PCR assay for the qualitative  detection of SARS-CoV-2 RNA. Viral nucleic acids may persist in vivo,  independent of viability. Detection of viral nucleic acid does not imply the  presence of infectious virus, or that virus nucleic acid is the cause of  clinical symptoms. Negative results do not preclude SARS-CoV-2 infection and  should not be used as the sole basis for diagnosis, treatment or other  patient management decisions. Negative results must be combined with  clinical observations, patient history, and/or epidemiological information.  Invalid results may be due to inhibiting substances in the specimen and  recollection should occur. Please see Fact Sheets for patients and providers  located:  WirelessDSLBlog.no.          Influenza A Not Detected     Influenza B Not Detected     Comment: Test performed using the Roche cobas Liat SARS-CoV-2 & Influenza A/B assay.  This assay is only for use under the Food and Drug Administration's  Emergency Use Authorization. This is a multiplex real-time RT-PCR assay  intended for the simultaneous in vitro qualitative detection and  differentiation of SARS-CoV-2, influenza A, and influenza B virus RNA. Viral  nucleic acids may persist in vivo, independent of viability. Detection of  viral nucleic acid does not imply the presence of infectious virus, or that  virus nucleic acid is the cause of clinical symptoms. Negative results do  not preclude SARS-CoV-2, influenza A, and/or influenza B infection and  should not be used as the sole basis for diagnosis, treatment or other  patient management decisions. Negative results must be combined with  clinical observations, patient history, and/or epidemiological information.  Invalid results may be due to inhibiting substances in the specimen and  recollection should occur. Please see Fact Sheets for patients  and providers  located: http://www.rice.biz/.         Narrative:      o Collect and clearly label specimen type:  o PREFERRED-Upper respiratory specimen: One Nasopharyngeal  Swab in Transport Media.  o Hand deliver to laboratory ASAP  Diagnostic -PUI    Blood Culture Aerobic/Anaerobic #1 [[id number]] Collected: 08/10/20 2159  Order Status: Completed Specimen: Arm from Blood, Venipuncture Updated: 08/16/20 0421    Narrative:      ORDER#: [id number]                                    ORDERED BY: SANDOVAL, LYNET  SOURCE: Blood, Venipuncture arm                      COLLECTED:  08/10/20 21:59  ANTIBIOTICS AT COLL.:                                RECEIVED :  08/11/20 01:48  Culture Blood Aerobic and Anaerobic        FINAL       08/16/20 04:21  08/16/20   No growth after 5 days of incubation.      Blood Culture Aerobic/Anaerobic #2 [[id number]] Collected: 08/10/20 2159    Order Status: Completed Specimen: Arm from Blood, Venipuncture Updated: 08/16/20 0421    Narrative:      ORDER#: [id number]                                    ORDERED BY: SANDOVAL, LYNET  SOURCE: Blood, Venipuncture arm                      COLLECTED:  08/10/20 21:59  ANTIBIOTICS AT COLL.:                                RECEIVED :  08/11/20 01:48  Culture Blood Aerobic and Anaerobic        FINAL       08/16/20 04:21  08/16/20   No growth after 5 days of incubation.      COVID-19 (SARS-COV-2) and Influenza (Liat Rapid) [[id number]]     Order Status: Sent Specimen: Culturette from Nasopharyngeal            Patient Instructions   Discharge Diet:  Consistent crab, cardiac  Discharge Activity:  Home with no needs     Follow Up Appointment:  Follow-up Information     Nash Mantis, MD. Schedule an appointment as soon as possible for a visit in 1 week(s).    Specialty: Family Medicine  Contact information:  [address]  [place] [address]  [phone]             Dione Housekeeper, MD. Schedule an appointment as soon as possible for  a visit in 1 week(s).    Specialty: Nephrology  Contact information:  [address]  [place] [address]  [phone]                    Time spent examining patient, discussing with patient/family regarding hospital course, chart review, reconciling medications and discharge planning:>30 minutes.    SignedPilar Plate, NP    3:56 PM 08/21/2020

## 2020-08-21 NOTE — Consults (Signed)
[facility] CONSULT/PROGRESS NOTE  Date:  08/21/2020  2:52 PM  Cardiologist: Edson Snowball MD/[facility] [place] (last OV 06/18/2020)    Case Discussed with [facility] Attending Glean Hess MD).  Discussed plan of care/procedure with patient/family who stated understanding.  Provider Completing Note:  Lonie Peak A. Elberta Spaniel CRNP, ANP-BC  [facility]  [facility]  [facility] [phone] (7am-7pm)  OFFICE Number [phone] (7pm-7am)  MD LINE [phone]    Patient:  [name].  DOB: 05-25-1981.  female  Date of Admission:  08/20/2020  Attending:  Anola Gurney, MD     Reason For Consultation:   HFpEF    ASSESSMENT/PLAN:   HFpEF/SOB/LE Edema - BNP 15. Mild RHF on exam. Home med: Bumex 2 mg PO BID. Received a total of Lasix 40 mg IV x3.   HTN -  Stable. 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 Renal following.  Elevated PTH (130.7) - Per Primary Team  OSA - undx  Morbid Obesity    - Likely undx OSA exacerbating symptoms. Recommend outpt evaluation.  - Continue current meds.  - Resume Bumex home dose. Lasix IV stopped.  - Has not been on Metolazone for >1 week. Continue to hold.  - Strict I/O's and standing daily weights. Standing wt 10/12: 311 lbs (was 263 lbs 08/2019)  - Fluid restrict to 1.5 L/day and Na 2 g/day. Keep K/Mag >4.0/2.0, replete PRN    CODE Status: Full    HISTORY OF PRESENT ILLNESS / SUBJECTIVE:   Deshawnda Acrey is a 39 y.o. female with a history of HFpEF, HTN, HLD, DM, hx PE (on Eliquis) who presented to the hospital on 08/20/2020 with worsening weight gain/leg swelling, DOE, fatigue and denied any other associated symptoms. She has had recent hospital admissions for where she received IVF boluses while in ICU. Pt reported compliance with all her meds. In the ED, pt noted with  leukocytosis, anemia, AKI, elevated platelet count, normal BNP, normal EKG. Pt received a total of 120 mg IV Lasix with improvement of symptoms. On evaluation, she denied chest pain, palpitations, lightheadedness, dizziness, SOB, orthopnea, PND, near syncope or syncope.    Cardiology consulted for HFpEF    Reported no current tobacco/alcohol/recreational drug use.    REVIEW OF SYSTEMS:                                              ARROS, ROSSSSS     All other systems were reviewed and were negative except as noted in HPI    HEART FAILURE:                                                       HFFFFF     Last filed weight: Weight: 141.2 kg (311 lb 4.6 oz) (141.2)  Weight on Admission: Weight: 143.3 kg (316 lb)    Intake/Output Summary (Last 24 hours) at 08/21/2020 0700  Last data filed at 08/21/2020 0330  Gross per 24 hour  Intake 500 ml   Output --   Net 500 ml     Weight change:     Last 3 Weights for the past 72 hrs (Last 3 readings):   Weight   08/21/20 1500 141.2 kg (311 lb 4.6 oz)   08/21/20 0330 143.3 kg (316 lb)   08/20/20 1058 143.3 kg (316 lb)       CARDIAC DIAGNOSTICS:                                         ECGGGGG     Telemetry:  (I independently reviewed the telemetry data) SR     ECG (08/20/2020):  (I independently reviewed the ECG tracing) NSR LVH, no acute ST/T wave abn    Chest -Xray (08/20/2020): No acute findings.     PAST MEDICAL HISTORY:     Past Medical History:   Diagnosis Date   . Arthritis    . Asthma    . Chronic kidney disease    . Congestive heart failure    . Diabetes mellitus    . Fibromyalgia    . Hyperlipidemia    . Hypertension    . Neuropathy    . Pulmonary embolism 08/2019   . Vertigo      Past Surgical History:   Procedure Laterality Date   . HYSTERECTOMY  12/2019       SOCIAL HISTORY:     Social History     Tobacco Use   . Smoking status: Never Smoker   . Smokeless tobacco: Never Used   Substance Use Topics   . Alcohol use: Never       FAMILY HISTORY:   family history includes  Asthma in her father and son; Cancer in her maternal aunt and maternal grandmother; Diabetes in her daughter and mother; Heart disease in her mother; Hypertension in her father and mother. Reported no premature CAD in the family.    PHYSICAL EXAM:                                                        ARPE3, ARPE2, PEEEEE   BP 105/69   Pulse 86   Temp 98.3 F (36.8 C) (Oral)   Resp 18   Ht 1.676 m (5' 5.98")   Wt 143.3 kg (316 lb)   SpO2 100%   BMI 51.03 kg/m      Intake/Output Summary (Last 24 hours) at 08/21/2020 1452  Last data filed at 08/21/2020 1134  Gross per 24 hour   Intake 740 ml   Output --   Net 740 ml       General Appearance:  Breathing comfortable, no acute distress  Head:  normocephalic  Eyes:  EOM's intact, nonicteric sclera  Neck:  No carotid bruit or jugular venous distension  Lungs:  Clear to auscultation throughout, no wheezes, rhonchi or rales, good respiratory effort, symmetric chest expansion  Cardiac:  RRR, normal S1, S2, no S3, no S4, no rub, no murmurs   Abdomen:  Soft, non-tender, no rebound or guarding, obese, positive bowel sounds. Multiple surgery sites  Extremities:  No cyanosis or clubbing. No arthritis or deformities. mild LE edema  Vascular: 2+ radial and distal pulses bilaterally  Neurologic:  Alert and oriented x3, mood and affect normal    LABORATORY:     CBC w/Diff   Recent Labs   Lab 08/21/20  1150 08/20/20  0115   WBC 11.76* 17.79*   Hgb 7.8* 7.5*   Hematocrit 26.0* 25.3*   Platelets 355* 392*        Basic Metabolic Profile   Recent Labs   Lab 08/21/20  0506 08/20/20  0115   Sodium 137 138   Potassium 4.9 4.2   Chloride 104 105   CO2 23 24   BUN 52.0* 60.0*   Creatinine 1.6* 1.6*   EGFR 35.5 35.5   Glucose 278* 111*   Calcium 8.6 9.3        Cardiac Enzymes   Recent Labs   Lab 08/20/20  0115   Troponin I <0.01     Recent Labs   Lab 08/20/20  0115   B-Natriuretic Peptide 15        D-dimer          Thyroid Studies         Invalid input(s): FREET4     Cholesterol Panel           Coagulation Studies          MEDICATIONS:    INFUSION MEDS:      SCHEDULED MEDS:     Current Facility-Administered Medications   Medication Dose Route Frequency   . apixaban  5 mg Oral Q12H SCH   . atorvastatin  20 mg Oral QHS   . bumetanide  2 mg Oral BID   . carvedilol  25 mg Oral BID   . gabapentin  600 mg Oral TID   . hydrALAZINE  50 mg Oral BID   . insulin glargine  20 Units Subcutaneous Q12H   . insulin lispro  1-6 Units Subcutaneous QHS   . insulin lispro  2-10 Units Subcutaneous TID AC   . pantoprazole  40 mg Oral QAM AC      PRN MEDS:   acetaminophen, baclofen, Nursing communication: Adult Hypoglycemia Treatment Algorithm **AND** dextrose **AND** dextrose **AND** glucagon (rDNA), hydrOXYzine

## 2020-08-21 NOTE — Discharge Instr - AVS First Page (Addendum)
Reason for your Hospital Admission:  Peripheral edema    Fluid overload due to diastolic dysfunction      Instructions for after your discharge:  Please follow up with Nephrology with in a week for continue diuresis     Weigh yourself daily    Restrict fluid intake to 1800 CC daily    Avoid taking NSAIDs to protect your kidneys

## 2020-08-21 NOTE — Plan of Care (Signed)
Problem: Safety  Goal: Patient will be free from injury during hospitalization  Outcome: Progressing  Flowsheets (Taken 08/21/2020 1946)  Patient will be free from injury during hospitalization:  . Assess patient's risk for falls and implement fall prevention plan of care per policy  . Provide and maintain safe environment     Problem: Everyday - Heart Failure  Goal: Stable Vital Signs and Fluid Balance  Outcome: Progressing  Flowsheets (Taken 08/21/2020 1946)  Stable Vital Signs and Fluid Balance:  . Daily Standing Weights in the morning using the same scale, after using the bathroom and before breakfast.  If unable to stand, zero the bed and use the bed scale  . Monitor, assess vital signs and telemetry per policy  . Assess for swelling/edema  . Wean oxygen as needed if appropriate  . Monitor labs and report abnormalities to physician  . Strict Intake/Output  . Fluid Restriction   AXOX4, independent.  VSS SR RA, no Cp , no SOB.  New PIV placed, finished up the Venofer dose.  Insulin per SS.  Continue IOs/ daily weight/ 1500 m/d.

## 2020-08-22 LAB — BASIC METABOLIC PANEL
Anion Gap: 9 (ref 5.0–15.0)
BUN: 51 mg/dL — ABNORMAL HIGH (ref 7.0–19.0)
CO2: 23 mEq/L (ref 22–29)
Calcium: 9 mg/dL (ref 8.5–10.5)
Chloride: 105 mEq/L (ref 100–111)
Creatinine: 1.5 mg/dL — ABNORMAL HIGH (ref 0.6–1.0)
Glucose: 230 mg/dL — ABNORMAL HIGH (ref 70–100)
Potassium: 4.7 mEq/L (ref 3.5–5.1)
Sodium: 137 mEq/L (ref 136–145)

## 2020-08-22 LAB — CBC
Absolute NRBC: 0.02 10*3/uL — ABNORMAL HIGH (ref 0.00–0.00)
Hematocrit: 26.2 % — ABNORMAL LOW (ref 34.7–43.7)
Hgb: 7.7 g/dL — ABNORMAL LOW (ref 11.4–14.8)
MCH: 25.8 pg (ref 25.1–33.5)
MCHC: 29.4 g/dL — ABNORMAL LOW (ref 31.5–35.8)
MCV: 87.9 fL (ref 78.0–96.0)
MPV: 10.4 fL (ref 8.9–12.5)
Nucleated RBC: 0.2 /100 WBC — ABNORMAL HIGH (ref 0.0–0.0)
Platelets: 379 10*3/uL — ABNORMAL HIGH (ref 142–346)
RBC: 2.98 10*6/uL — ABNORMAL LOW (ref 3.90–5.10)
RDW: 14 % (ref 11–15)
WBC: 12.14 10*3/uL — ABNORMAL HIGH (ref 3.10–9.50)

## 2020-08-22 LAB — HEMOLYSIS INDEX: Hemolysis Index: 1 (ref 0–18)

## 2020-08-22 LAB — GLUCOSE WHOLE BLOOD - POCT
Whole Blood Glucose POCT: 188 mg/dL — ABNORMAL HIGH (ref 70–100)
Whole Blood Glucose POCT: 205 mg/dL — ABNORMAL HIGH (ref 70–100)

## 2020-08-22 LAB — GFR: EGFR: 38.3

## 2020-08-22 NOTE — Progress Notes (Signed)
[facility] [employer].                                                                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   Presents with fluid overload and 20 pound weight gain in 1 week; was recently hospitalized for HHS and aggressively hydrated.   Acute diastolic CHF   Untreated OSA   HTN   Anemia of chronic illness and iron deficiency   Type 2 diabetes, recent admission with HHS      Recommendations:   1. Continue home diuretic of Bumex 2 mg twice daily  2. I reinforced 2 L fluid restriction.  3. Aranesp and IV iron  4. She will need ESA as an outpatient; suggest EPO 10,000 units weekly x3 weeks  5. Daily standing weights      Labs:   Recent Labs   Lab 08/22/20  0433 08/21/20  0506 08/20/20  0115   Sodium 137 137 138   Potassium 4.7 4.9 4.2   Chloride 105 104 105   CO2 23 23 24    BUN 51.0* 52.0* 60.0*   Creatinine 1.5* 1.6* 1.6*   Glucose 230* 278* 111*   Calcium 9.0 8.6 9.3   EGFR 38.3 35.5 35.5     Recent Labs   Lab 08/22/20  0433 08/21/20  1150 08/20/20  0115   WBC 12.14* 11.76* 17.79*   Hgb 7.7* 7.8* 7.5*   Hematocrit 26.2* 26.0* 25.3*   Platelets 379* 355* 392*       Recent Labs   Lab 08/21/20  0506   Ferritin 192.40     Recent Labs   Lab 08/21/20  0506   Iron 47   TIBC 227*     Recent Labs   Lab 08/21/20  1240   Color, UA Straw   Specific Gravity UA 1.009   Urine pH 6.0   Leukocyte Esterase, UA Negative   Nitrite, UA Negative   Protein, UR 100*   Glucose, UA 50*   Ketones UA Negative   Urobilinogen, UA Negative   Bilirubin, UA Negative   Blood, UA Negative   RBC, UA 0 - 2   Squamous Epithelial Cells, Urine 0 - 5         Medications:      Scheduled Meds:     apixaban, 5 mg, Oral, Q12H SCH  atorvastatin, 20 mg, Oral, QHS  bumetanide, 2 mg, Oral, BID  carvedilol, 25 mg, Oral, BID  gabapentin, 600 mg, Oral, TID  hydrALAZINE, 50 mg, Oral,  Q12H SCH  insulin glargine, 20 Units, Subcutaneous, Q12H  insulin lispro, 1-6 Units, Subcutaneous, QHS  insulin lispro, 2-10 Units, Subcutaneous, TID AC  iron sucrose, 300 mg, Intravenous, QPM  pantoprazole, 40 mg, Oral, QAM AC  Continuous Infusions:        PRN Meds:acetaminophen, baclofen, Nursing communication: Adult Hypoglycemia Treatment Algorithm **AND** dextrose **AND** dextrose **AND** glucagon (rDNA), hydrOXYzine    Subjective:   No new complaints.    No SOB  No Chest pain  No Abdominal pain  No Nausea/Vomiting, diarrhea    Objective:   Vital signs in last 24 hours:  Temp:  [98.6 F (37 C)-99.2 F (37.3 C)] 99.1 F (37.3 C)  Heart Rate:  [72-92] 72  Resp Rate:  [16-18] 18  BP: (98-134)/(65-79) 121/65    Intake/Output:   Intake/Output last 24 hours:    Intake/Output Summary (Last 24 hours) at 08/22/2020 1246  Last data filed at 08/22/2020 0600  Gross per 24 hour   Intake 690 ml   Output --   Net 690 ml     Intake/Output this shift:  No intake/output data recorded.     General:    In NAD          CVS:  RRR, no rub        Chest:  CTA/Bilaterally  Abdomen:   Soft, nontender           Ext.:   2+ edema      Dione Housekeeper, MD  [facility]  Office [phone]

## 2020-08-22 NOTE — Plan of Care (Signed)
Problem: Safety  Goal: Patient will be free from injury during hospitalization  Outcome: Adequate for Discharge  Goal: Patient will be free from infection during hospitalization  Outcome: Adequate for Discharge     Problem: Pain  Goal: Pain at adequate level as identified by patient  Outcome: Adequate for Discharge     Problem: Side Effects from Pain Analgesia  Goal: Patient will experience minimal side effects of analgesic therapy  Outcome: Adequate for Discharge     Problem: Discharge Barriers  Goal: Patient will be discharged home or other facility with appropriate resources  Outcome: Adequate for Discharge     Problem: Psychosocial and Spiritual Needs  Goal: Demonstrates ability to cope with hospitalization/illness  Outcome: Adequate for Discharge     Problem: Day of Admission - Heart Failure  Goal: Heart Failure Admission  Outcome: Adequate for Discharge     Problem: Everyday - Heart Failure  Goal: Stable Vital Signs and Fluid Balance  Outcome: Adequate for Discharge  Goal: Mobility/Activity is Maintained at Optimal Level for Patient  Outcome: Adequate for Discharge  Goal: Nutritional Intake is Adequate  Outcome: Adequate for Discharge  Goal: Teaching-Using CHF Warning Zones and Educational Videos  Outcome: Adequate for Discharge     Problem: Day of Discharge - Heart Failure  Goal: Discharge Education  Outcome: Adequate for Discharge

## 2020-08-22 NOTE — Discharge Summary -  Nursing (Signed)
AVS reviewed with patient, she was instructed to make her follow up appointments and take medications as prescribed. She verbalized understanding. Patient's 22g PIV in her right hand was removed with no complications. Her telemetry box was removed and delivered to telemetry unit by RN. Transport was called and retrieved her from the unit, she ordered an uber/lyft for a ride home.

## 2020-08-22 NOTE — Progress Notes (Signed)
DISCHARGE: Patient discharged to home today with no CM needs.  Patient arranged her own transport.     08/22/20 1437   Discharge Disposition   Patient preference/choice provided? Yes   Physical Discharge Disposition Home   Mode of Transportation Car   Patient/Family/POA notified of transfer plan Yes   Patient agreeable to discharge plan/expected d/c date? Yes   Family/POA agreeable to discharge plan/expected d/c date? Yes   Bedside nurse notified of transport plan? Yes   CM Interventions   Follow up appointment scheduled? No   Notified MD? Yes   Multidisciplinary rounds/family meeting before d/c? Yes   Medicare Checklist   Is this a Medicare patient? No     Lynnell Catalan, MSW  Social Worker Case Manager I  [facility]  [phone]

## 2020-08-22 NOTE — Discharge Summary (Signed)
[facility]      Patient: [name]  Admission Date: 08/20/2020   DOB: [birth year]  Discharge Date: 08/22/2020    MRN: [id number]  Discharge Attending: Dr.Morales, Pilar Plate, NP     Referring Physician: Nash Mantis, MD  PCP: Nash Mantis, MD       DISCHARGE SUMMARY     Discharge Information   Admission Diagnosis:   CHF NYHA class III, acute, combined    Discharge Diagnosis:   Active Hospital Problems    Diagnosis   . Morbid obesity   . Hypertension   . Fibromyalgia   . Diabetic neuropathy associated with type 2 diabetes mellitus   . CHF NYHA class III, acute, combined   . Peripheral edema   . Acute renal failure superimposed on stage 3 chronic kidney disease   -Untreated OSA     Admission Condition: Poor  Discharge Condition: Stable  Consultants:Nephrology and cardiology  Functional Status: Ambulatory  Discharged to:  Home     Discharge Medications:     Medication List      CHANGE how you take these medications    bumetanide 2 MG tablet  Commonly known as: BUMEX  Take 1 tablet (2 mg total) by mouth 2 (two) times daily  What changed: when to take this        CONTINUE taking these medications    albuterol sulfate HFA 108 (90 Base) MCG/ACT inhaler  Commonly known as: PROVENTIL     Alcohol Swabs Pads  Use as indicated for insulin injections     apixaban 5 MG  Commonly known as: ELIQUIS     atorvastatin 20 MG tablet  Commonly known as: LIPITOR     baclofen 10 MG tablet  Commonly known as: LIORESAL     carvedilol 25 MG tablet  Commonly known as: COREG     folic acid 1 MG tablet  Commonly known as: FOLVITE     gabapentin 600 MG tablet  Commonly known as: NEURONTIN     hydrALAZINE 50 MG tablet  Commonly known as: APRESOLINE     hydrOXYzine 10 MG tablet  Commonly known as: ATARAX     insulin lispro 100 UNIT/ML injection  Commonly known as: HumaLOG  Inject 25 Units into the skin 3 (three) times daily before meals     Insulin Syringe-Needle U-100 27G X 1/2" 1 ML Misc  Inject 1 Dose into the skin 4  (four) times daily  Notes to patient: Per home regimen     Levemir 100 UNIT/ML injection  Generic drug: insulin detemir  Inject 50 Units into the skin every 12 (twelve) hours  Notes to patient: Per home regimen     liraglutide 18 MG/3ML injection  Commonly known as: VICTOZA  Inject 0.6 mg into the skin daily  Notes to patient: Per home regimen     montelukast 10 MG tablet  Commonly known as: SINGULAIR  Notes to patient: Per home regimen     NON FORMULARY  Notes to patient: Per home regimen     omeprazole 40 MG capsule  Commonly known as: PriLOSEC  Notes to patient: Per home regimen        STOP taking these medications    aspirin EC 81 MG EC tablet           Where to Get Your Medications      These medications were sent to [id number], [place] - [address]  [address] [place] [address]  Hours: 24-hours Phone: [phone]    bumetanide 2 MG tablet             Hospital Course   Presentation History   [name] a 39 y.o.femalewith medical history of asthma,D- CHF,PE on Eliquis, Anemia resulting from heparin drip started for PE leading to 6 units of PRBCs, uterine angiography/embolization,HHS, hypertension, hyperlipidemia, diabetes, morbid obesity and GERDwho presented with bilateral lower extremity edema, increasing abdominal girth, weight gain of 20 pounds in 6 days, shortness of breath on exertion and fatigue.  Patient states she has been gaining weight since discharge after receiving multiple volumes of fluids while in ICU, she has been taking her Bumex 2 mg daily with no improvement and has been restricting her fluid intake. She reports history of being on Lasix drip due to edema at [facility] with improvement and was requesting to be on Lasix drip.  Nephrology consulted to assist with her volume management in view of CKD.  She also reports intermittent abdominal discomfort.  She denies chest pain, lightheadedness, shortness of breath at rest, orthopnea, PND, URI  symptoms, urinary symptoms.  She endorses minimal urine output in the past few days, generalized body aches due to fibromyalgia and peripheral neuropathy due to diabetes.    See HPI for details.    Hospital Course (35 Days)   39 year old African-American female with a past medical history of diastolic heart failure, pulmonary emboli on Eliquis, asthma and anemia who came to emergency room with worsening lower extremity edema associated with shortness of breath and fatigue, admitted for CHF exacerbation. Patient started on IV Lasix 40 mg twice daily, her kidney function noted with creatinine of 1.6 and BUN of 60, her kidney Ultrasound is unremarkable 5.9 cm, left kidney 9.6 cm, no hydronephrosis no stones or mass. Nephrology consulted in view of continuing diuresis in CKD 3B/4 and Anemia. Patient received iron Venofer X3. Patient will be discharged to continue Bumex 2 mg twice daily, fluid restrictions to 1800 mL daily with 2 g sodium diet as well as follow-up with nephrology outpatient. Cardiology consulted in view of CHF and recurrent admission for fluid overload, no ischemic evaluation indicated. Patient is feeling much better, stable for discharge with recommendations to follow up with Nephrology out patient and to continue PO Bumex, strict intake and output, fluid restrictions 1.5 to 1.8 Ml/daily. Please see H+P, consults and progress Motes for detailed information.        Procedures/Imaging:   Upon my review:   US Abdomen Complete    Result Date: 08/20/2020   1. No ascites 2. Moderate hepatomegaly Laurena Slimmer, MD  08/20/2020 1:48 PM    Chest AP Portable    Result Date: 08/20/2020  No acute findings. Nicki Reaper, MD  08/20/2020 2:11 AM         Best Practices   Was the patient admitted with either a CHF Exacerbation or Pneumonia? No     Progress Note/Physical Exam at Discharge     Subjective:  States she feels better today, sob and edema improving, no other complaints, awaiting Cardiology recommendations      Vitals:    08/21/20 2300 08/22/20 0430 08/22/20 0732 08/22/20 0932   BP: 124/68 125/76 129/78 121/65   Pulse: 92 84  72   Resp: 16 17 18     Temp: 98.6 F (37 C) 98.6 F (37 C) 99.1 F (37.3 C)    TempSrc: Oral Oral Oral    SpO2: 100% 96% 97%    Weight:  141.2  kg (311 lb 4.6 oz)     Height:          General: Obese, non-toxic, in NAD, AAOx3  HEENT: perrla, eomi, sclera anicteric, OP: Clear, MMM  Neck: supple, No JVD  Cardiovascular: RRR, no m/r/g  Lungs: CTAB, no w/r/r  Abdomen: soft, +BS, NT/ND, no masses, no g/r  Extremities: no C/C, + 2+ edema BLE  Skin: no rashes or lesions noted  Neuro: CN 2-12 intact; No Focal neurological deficits       Diagnostics     Labs/Studies Pending at Discharge: No    Last Labs   Recent Labs   Lab 08/22/20  0433 08/21/20  1150 08/20/20  0115   WBC 12.14* 11.76* 17.79*   RBC 2.98* 2.99* 2.88*   Hgb 7.7* 7.8* 7.5*   Hematocrit 26.2* 26.0* 25.3*   MCV 87.9 87.0 87.8   Platelets 379* 355* 392*       Recent Labs   Lab 08/22/20  0433 08/21/20  0506 08/20/20  0115   Sodium 137 137 138   Potassium 4.7 4.9 4.2   Chloride 105 104 105   CO2 23 23 24    BUN 51.0* 52.0* 60.0*   Creatinine 1.5* 1.6* 1.6*   Glucose 230* 278* 111*   Calcium 9.0 8.6 9.3       Microbiology Results (last 15 days)     Procedure Component Value Units Date/Time    COVID-19 (SARS-COV-2) Verne Carrow Rapid) [[id number]] Collected: 08/20/20 0106    Order Status: Completed Specimen: Nasopharyngeal Swab from Nasopharynx Updated: 08/20/20 0154     Purpose of COVID testing Screening     SARS-CoV-2 Specimen Source Nasopharyngeal     SARS CoV 2 Overall Result Negative     Comment: Test performed using the Abbott ID NOW EUA assay.  Please see Fact Sheets for patients and providers located at:  http://www.rice.biz/    This test is for the qualitative detection of SARS-CoV-2  (COVID19) nucleic acid. Viral nucleic acids may persist in vivo,  independent of viability. Detection of viral nucleic acid does  not imply the  presence of infectious virus, or that virus  nucleic acid is the cause of clinical symptoms. Negative  results should be treated as presumptive and, if inconsistent  with clinical signs and symptoms or necessary for patient  management, should be tested with an alternative molecular  assay. Negative results do not preclude SARS-CoV-2 infection  and should not be used as the sole basis for patient  management decisions. Invalid results may be due to inhibiting  substances in the specimen and recollection should occur.         Narrative:      o Collect and clearly label specimen type:  o Upper respiratory specimen: One Nasopharyngeal Dry Swab NO  Transport Media.  o Hand deliver to laboratory ASAP  Indication for testing->Extended care facility admission to  semi private room  Screening    MRSA culture - Nares (If not done in triage) [[id number]] Collected: 08/11/20 2048    Order Status: Completed Specimen: Culturette from Nares Updated: 08/12/20 2120     Culture MRSA Surveillance Negative for Methicillin Resistant Staph aureus    MRSA culture - Throat (If not done in triage) [[id number]]  (Abnormal) Collected: 08/11/20 2048    Order Status: Completed Specimen: Culturette from Throat Updated: 08/12/20 2128     Culture MRSA Surveillance Positive for Methicillin Resistant Staph aureus    Narrative:      [id number] called Micro Results  of pos mrsa cx Results read back by:[id number], by  [id number] on 08/12/2020 at 21:28    RSV screen [[id number]] Collected: 08/11/20 0747    Order Status: Completed Specimen: Nasopharyngeal from Nasal Aspirate Updated: 08/11/20 0833    Narrative:      ORDER#: [id number]                                    ORDERED BY: Marya Amsler  SOURCE: Nasal Aspirate                               COLLECTED:  08/11/20 07:47  ANTIBIOTICS AT COLL.:                                RECEIVED :  08/11/20 07:53  RSV, Rapid Antigen                         FINAL       08/11/20 08:33  08/11/20   Negative for RSV Antigen              Reference Range: Negative      COVID-19 (SARS-COV-2) and Influenza Raina Mina Rapid) [[id number]] Collected: 08/10/20 2159    Order Status: Completed Specimen: Culturette from Nasopharyngeal Updated: 08/10/20 2242     Purpose of COVID testing Diagnostic -PUI     SARS-CoV-2 Specimen Source Nasopharyngeal     SARS CoV 2 Overall Result Not Detected     Comment: Test performed using the Roche cobas Liat SARS-CoV-2 assay. This assay is  only for use under the Food and Drug Administration's Emergency Use  Authorization. This is a real-time RT-PCR assay for the qualitative  detection of SARS-CoV-2 RNA. Viral nucleic acids may persist in vivo,  independent of viability. Detection of viral nucleic acid does not imply the  presence of infectious virus, or that virus nucleic acid is the cause of  clinical symptoms. Negative results do not preclude SARS-CoV-2 infection and  should not be used as the sole basis for diagnosis, treatment or other  patient management decisions. Negative results must be combined with  clinical observations, patient history, and/or epidemiological information.  Invalid results may be due to inhibiting substances in the specimen and  recollection should occur. Please see Fact Sheets for patients and providers  located:  WirelessDSLBlog.no.          Influenza A Not Detected     Influenza B Not Detected     Comment: Test performed using the Roche cobas Liat SARS-CoV-2 & Influenza A/B assay.  This assay is only for use under the Food and Drug Administration's  Emergency Use Authorization. This is a multiplex real-time RT-PCR assay  intended for the simultaneous in vitro qualitative detection and  differentiation of SARS-CoV-2, influenza A, and influenza B virus RNA. Viral  nucleic acids may persist in vivo, independent of viability. Detection of  viral nucleic acid does not imply the presence of infectious virus, or that  virus nucleic acid is the cause of clinical symptoms.  Negative results do  not preclude SARS-CoV-2, influenza A, and/or influenza B infection and  should not be used as the sole basis for diagnosis, treatment or other  patient management decisions. Negative results must be combined with  clinical observations, patient history, and/or  epidemiological information.  Invalid results may be due to inhibiting substances in the specimen and  recollection should occur. Please see Fact Sheets for patients and providers  located: http://www.rice.biz/.         Narrative:      o Collect and clearly label specimen type:  o PREFERRED-Upper respiratory specimen: One Nasopharyngeal  Swab in Transport Media.  o Hand deliver to laboratory ASAP  Diagnostic -PUI    Blood Culture Aerobic/Anaerobic #1 [[id number]] Collected: 08/10/20 2159    Order Status: Completed Specimen: Arm from Blood, Venipuncture Updated: 08/16/20 0421    Narrative:      ORDER#: [id number]                                    ORDERED BY: SANDOVAL, LYNET  SOURCE: Blood, Venipuncture arm                      COLLECTED:  08/10/20 21:59  ANTIBIOTICS AT COLL.:                                RECEIVED :  08/11/20 01:48  Culture Blood Aerobic and Anaerobic        FINAL       08/16/20 04:21  08/16/20   No growth after 5 days of incubation.      Blood Culture Aerobic/Anaerobic #2 [[id number]] Collected: 08/10/20 2159    Order Status: Completed Specimen: Arm from Blood, Venipuncture Updated: 08/16/20 0421    Narrative:      ORDER#: [id number]                                    ORDERED BY: SANDOVAL, LYNET  SOURCE: Blood, Venipuncture arm                      COLLECTED:  08/10/20 21:59  ANTIBIOTICS AT COLL.:                                RECEIVED :  08/11/20 01:48  Culture Blood Aerobic and Anaerobic        FINAL       08/16/20 04:21  08/16/20   No growth after 5 days of incubation.      COVID-19 (SARS-COV-2) and Influenza (Liat Rapid) [[id number]]     Order Status: Sent Specimen: Culturette from Nasopharyngeal             Patient Instructions   Discharge Diet:  Consistent crab, cardiac  Discharge Activity:  Home with no needs     Follow Up Appointment:  Follow-up Information     Nash Mantis, MD. Schedule an appointment as soon as possible for a visit in 1 week(s).    Specialty: Family Medicine  Contact information:  [address]  [place] [address]  [phone]             Dione Housekeeper, MD. Schedule an appointment as soon as possible for a visit in 1 week(s).    Specialty: Nephrology  Contact information:  [address]  [place] [address]  [phone]             Fortino Sic, MD.  Schedule an appointment as soon as possible for a visit in 1 week(s).    Specialties: Cardiology, Internal Medicine, Interventional Cardiology  Contact information:  [address]  [place] [address]  [phone]                    Time spent examining patient, discussing with patient/family regarding hospital course, chart review, reconciling medications and discharge planning:>30 minutes.    SignedPilar Plate, NP    1:39 PM 08/22/2020

## 2020-08-24 ENCOUNTER — Telehealth (INDEPENDENT_AMBULATORY_CARE_PROVIDER_SITE_OTHER): Payer: Self-pay | Admitting: Cardiology

## 2020-08-24 ENCOUNTER — Ambulatory Visit (INDEPENDENT_AMBULATORY_CARE_PROVIDER_SITE_OTHER): Payer: Self-pay | Admitting: Cardiovascular Disease

## 2020-08-24 NOTE — Telephone Encounter (Signed)
I found a sooner appointment for patient 08/30/20 heart failure she stated she would be out of town

## 2020-08-24 NOTE — Progress Notes (Deleted)
[facility] Cardiology office consultation    Referring Physician:    I had the pleasure of seeing Ms. [name] today for cardiovascular evaluation. She is a pleasant 39 y.o. ***    She  . He denies any chest pain,  Exertional angina, dyspnea on exertion, palpitations, lower extremity edema, orthopnea, paroxysmal nocturnal dyspnea, or syncope .      PAST MEDICAL HISTORY: She has a past medical history of Arthritis, Asthma, Chronic kidney disease, Congestive heart failure, Diabetes mellitus, Fibromyalgia, Hyperlipidemia, Hypertension, Neuropathy, Pulmonary embolism (08/2019), and Vertigo. She has a past surgical history that includes Hysterectomy (12/2019).    MEDICATIONS: She has a current medication list which includes the following prescription(s): albuterol sulfate hfa - Inhale 2 puffs into the lungs every 4 (four) hours as needed for Wheezing, alcohol swabs - Use as indicated for insulin injections, apixaban - Take 5 mg by mouth every 12 (twelve) hours, atorvastatin - Take 20 mg by mouth nightly, baclofen - Take 10 mg by mouth as needed, bumetanide - Take 1 tablet (2 mg total) by mouth 2 (two) times daily, carvedilol - Take 25 mg by mouth 2 (two) times daily, folic acid - Take 1 mg by mouth daily, gabapentin - Take 600 mg by mouth 3 (three) times daily, hydralazine - Take 50 mg by mouth 2 (two) times daily, hydroxyzine - Take 10 mg by mouth every 6 (six) hours as needed for Anxiety, levemir - Inject 50 Units into the skin every 12 (twelve) hours, insulin lispro - Inject 25 Units into the skin 3 (three) times daily before meals, insulin syringe-needle u-100 - Inject 1 Dose into the skin 4 (four) times daily, liraglutide - Inject 0.6 mg into the skin daily, montelukast - Take 10 mg by mouth nightly, NON FORMULARY - daily BRIO, and omeprazole - Take 40 mg by mouth nightly.    ALLERGIES:   Allergies   Allergen Reactions   . Bactrim [Sulfamethoxazole-Trimethoprim] Hives, Respiratory Distress and Edema        FAMILY HISTORY: Her family history includes Asthma in her father and son; Cancer in her maternal aunt and maternal grandmother; Diabetes in her daughter and mother; Heart disease in her mother; Hypertension in her father and mother.    SOCIAL HISTORY: She reports that she has never smoked. She has never used smokeless tobacco. She reports that she does not drink alcohol and does not use drugs.    REVIEW OF SYSTEMS    All other 12 point ROS reviewed and negative except as  Per HPI    PHYSICAL EXAMINATION  General Appearance:  A well-appearing {MAN/WOMAN:21025008} in no acute distress.   Vital Signs: There were no vitals taken for this visit.      Neck:  Supple without jugular venous distention.   Chest: Good air movement and respiratory effort  Bilaterally. Clear to auscultation. No wheezes, rales, or rhonchi   Cardiovascular: Normal S1 and S2 . No murmurs, gallops or rub. PMI nondisplaced.   Abdomen: Soft, nontender, nondistended,  Extremities: Warm . ***edema,  DP ***  Skin: No rash,    Neuro:  Grossly intact.    Psych:  Normal mood and affect.     LABS: ***    PROCEEDURES:    ECG:  {ECG INTERPRETATION:29439::"normal sinus rhythm at *** bpm","normal axis and intervals","no significant ST/T changes","normal ECG","no significant change from prior"}    ECHO:    STRESS TEST:    ASSESSMENT AND PLAN:  All questions regarding cardiovascular diseases were answered during this encounter.      No orders of the defined types were placed in this encounter.    No follow-ups on file.      Bari Edward, MD

## 2020-08-30 ENCOUNTER — Ambulatory Visit (INDEPENDENT_AMBULATORY_CARE_PROVIDER_SITE_OTHER): Payer: No Typology Code available for payment source | Admitting: Cardiology

## 2020-09-28 ENCOUNTER — Ambulatory Visit (INDEPENDENT_AMBULATORY_CARE_PROVIDER_SITE_OTHER): Payer: BC Managed Care – PPO | Admitting: Cardiovascular Disease

## 2020-10-21 ENCOUNTER — Emergency Department: Payer: BC Managed Care – PPO

## 2020-10-21 ENCOUNTER — Emergency Department
Admission: EM | Admit: 2020-10-21 | Discharge: 2020-10-21 | Disposition: A | Discharge status: Home / Self Care | Payer: BC Managed Care – PPO | Attending: Emergency Medicine | Admitting: Emergency Medicine

## 2020-10-21 DIAGNOSIS — S92902A Unspecified fracture of left foot, initial encounter for closed fracture: Secondary | ICD-10-CM

## 2020-10-21 DIAGNOSIS — M79672 Pain in left foot: Secondary | ICD-10-CM

## 2020-10-21 DIAGNOSIS — S92902K Unspecified fracture of left foot, subsequent encounter for fracture with nonunion: Secondary | ICD-10-CM

## 2020-10-21 DIAGNOSIS — X58XXXA Exposure to other specified factors, initial encounter: Secondary | ICD-10-CM | POA: Insufficient documentation

## 2020-10-21 MED ORDER — OXYCODONE-ACETAMINOPHEN 5-325 MG PO TABS
1.0000 | ORAL_TABLET | Freq: Once | ORAL | Status: AC
Start: 2020-10-21 — End: 2020-10-21
  Administered 2020-10-21: 23:00:00 1 via ORAL
  Filled 2020-10-21: qty 1

## 2020-10-21 MED ORDER — OXYCODONE-ACETAMINOPHEN 5-325 MG PO TABS
1.0000 | ORAL_TABLET | ORAL | 0 refills | Status: AC | PRN
Start: 2020-10-21 — End: 2020-10-28

## 2020-10-21 MED ORDER — DOCUSATE SODIUM 100 MG PO CAPS
100.0000 mg | ORAL_CAPSULE | Freq: Two times a day (BID) | ORAL | 0 refills | Status: AC
Start: 2020-10-21 — End: 2020-10-28

## 2020-10-21 NOTE — ED Provider Notes (Signed)
ED PHYSICIAN ASSIGNED     Date/Time Event User Comments    10/21/20 2009 Physician Assigned Windell Hummingbird. Windell Hummingbird, DO assigned as Attending              History     Chief Complaint   Patient presents with   . Leg Pain     Per patient, LEFT foot pain, swelling and redness in last few days.  No fevers.  Has h/o foot fracture in June w/ recommended surgery that she did not get.  Denies new trauma.     The history is provided by the patient. No language interpreter was used.   Foot Injury  Location:  Foot  Foot location:  L foot  Pain details:     Quality:  Throbbing    Radiates to:  L leg    Severity:  Moderate    Onset quality:  Gradual    Timing:  Constant    Progression:  Worsening  Chronicity:  Recurrent  Ineffective treatments:  None tried  Associated symptoms: swelling    Associated symptoms: no fever, no numbness, no stiffness and no tingling         Past Medical History:   Diagnosis Date   . Arthritis    . Asthma    . Chronic kidney disease    . Congestive heart failure    . Diabetes mellitus    . Fibromyalgia    . Hyperlipidemia    . Hypertension    . Neuropathy    . Pulmonary embolism 08/2019   . Vertigo        Past Surgical History:   Procedure Laterality Date   . HYSTERECTOMY  12/2019       Family History   Problem Relation Age of Onset   . Heart disease Mother    . Hypertension Mother    . Diabetes Mother    . Asthma Father    . Hypertension Father    . Diabetes Daughter    . Asthma Son    . Cancer Maternal Aunt    . Cancer Maternal Grandmother        Social  Social History     Tobacco Use   . Smoking status: Never Smoker   . Smokeless tobacco: Never Used   Vaping Use   . Vaping Use: Never used   Substance Use Topics   . Alcohol use: Never   . Drug use: Never       .     Allergies   Allergen Reactions   . Bactrim [Sulfamethoxazole-Trimethoprim] Hives, Respiratory Distress and Edema   . Glipizide Edema     Throat swelling   . Lisinopril Edema     Tongue swelling       Home Medications     Med  List Status: In Progress Set By: Larey Days, RN at 10/21/2020  8:04 PM                albuterol sulfate HFA (PROVENTIL) 108 (90 Base) MCG/ACT inhaler     Inhale 2 puffs into the lungs every 4 (four) hours as needed for Wheezing     Alcohol Swabs Pads     Use as indicated for insulin injections     apixaban (ELIQUIS) 5 MG     Take 5 mg by mouth every 12 (twelve) hours     atorvastatin (LIPITOR) 20 MG tablet     Take 20 mg by mouth nightly  baclofen (LIORESAL) 10 MG tablet     Take 10 mg by mouth as needed     bumetanide (BUMEX) 2 MG tablet     Take 1 tablet (2 mg total) by mouth 2 (two) times daily     carvedilol (COREG) 25 MG tablet     Take 25 mg by mouth 2 (two) times daily     folic acid (FOLVITE) 1 MG tablet     Take 1 mg by mouth daily     gabapentin (NEURONTIN) 600 MG tablet     Take 600 mg by mouth 3 (three) times daily     hydrALAZINE (APRESOLINE) 50 MG tablet     Take 50 mg by mouth 2 (two) times daily     hydrOXYzine (ATARAX) 10 MG tablet     Take 10 mg by mouth every 6 (six) hours as needed for Anxiety     insulin detemir (Levemir) 100 UNIT/ML injection     Inject 50 Units into the skin every 12 (twelve) hours     insulin lispro (HumaLOG) 100 UNIT/ML injection     Inject 25 Units into the skin 3 (three) times daily before meals     Insulin Syringe-Needle U-100 27G X 1/2" 1 ML Misc     Inject 1 Dose into the skin 4 (four) times daily     liraglutide (VICTOZA) 18 MG/3ML injection     Inject 0.6 mg into the skin daily     montelukast (SINGULAIR) 10 MG tablet     Take 10 mg by mouth nightly     NON FORMULARY     daily BRIO     omeprazole (PriLOSEC) 40 MG capsule     Take 40 mg by mouth nightly           Review of Systems   Constitutional: Negative for fever.   Musculoskeletal: Negative for stiffness.   All other systems reviewed and are negative.      Physical Exam    BP: (!) 144/98, Heart Rate: 96, Resp Rate: 18, SpO2: 94 %, Weight: 127 kg    Physical Exam  Vitals and nursing note reviewed.    Constitutional:       General: She is not in acute distress.     Appearance: Normal appearance. She is not ill-appearing.   HENT:      Head: Normocephalic and atraumatic.      Nose: Nose normal. No rhinorrhea.      Mouth/Throat:      Mouth: Mucous membranes are moist.      Pharynx: Oropharynx is clear.   Eyes:      Conjunctiva/sclera: Conjunctivae normal.      Pupils: Pupils are equal, round, and reactive to light.   Cardiovascular:      Rate and Rhythm: Normal rate.      Pulses: Normal pulses.   Pulmonary:      Effort: Pulmonary effort is normal. No respiratory distress.   Musculoskeletal:         General: Swelling and tenderness present. Normal range of motion.      Cervical back: Normal range of motion and neck supple. No rigidity.      Comments: LLE: NV intact, cap refill < 2 sec; TTP along dorsal foot w/ dry/cracked skin, no e/o abscess/cellulitis.     Skin:     General: Skin is warm and dry.      Capillary Refill: Capillary refill takes less than 2 seconds.  Findings: No rash.   Neurological:      General: No focal deficit present.      Mental Status: She is alert and oriented to person, place, and time.      Cranial Nerves: No cranial nerve deficit.      Motor: No weakness.      Coordination: Coordination normal.   Psychiatric:         Mood and Affect: Mood normal.         Behavior: Behavior normal.           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 New fracture of 3rd metatarsal.  Swelling likely 2/2 both these findings.  No e/o cellulitis on exam.  NV intact otherwise.  On eliquis and reports compliance.  Do not have [place] available overnight due to staffing shortages.  Less likely DVT given compliance history and long term anti-coagulation use.  Recommend RICE, walking boot, crutches and outpatient PCP/Ortho follow up.  Recommend f/u US of leg if swelling persists/worsens.  Patient comfortable with plan of care.    No results found for this visit on 10/21/20.    Results for orders placed or performed during the hospital encounter of 10/21/20  -Foot Left AP Lateral And Oblique:                                                                                                                     Narrative    Clinical History:    known jones fracture (June 2021) w/ increased pain/swelling         Examination:    XR FOOT LEFT AP LATERAL AND OBLIQUE        Comparison:    None available.  Impression    Subacute appearing transverse fracture involving the proximal fifth    metatarsal. There is a small callus formation on the lateral view but    persistent lucency at the fracture site measuring up to 5mm, suspicious    for nonunion.        Acute/recent appearing minimally displaced transverse fracture of the    third metatarsal base, not associated with significant callus formation.            [name], MD     10/21/2020 9:35 PM    D/W patient ED course, treatment plan, lab/imaging results, discharge instructions and return precautions prior to discharge.  Patient understands and agrees with plan of care.           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    Condition at disposition: Stable           New Prescriptions    DOCUSATE SODIUM (COLACE) 100 MG CAPSULE    Take 1 capsule (100 mg total) by mouth 2 (two) times daily for 7 days    OXYCODONE-ACETAMINOPHEN (PERCOCET) 5-325 MG PER TABLET    Take 1 tablet by mouth every 4 (four) hours as needed for Pain                 Windell Hummingbird, DO  10/21/20 2225

## 2020-10-21 NOTE — EDIE (Signed)
COLLECTIVE?NOTIFICATION?10/21/2020 19:42?Currie Paris?MRN: [id number]    [facility]'s patient encounter information:   MVH:?[id number]  Account [id number]  Billing Account [id number]      Criteria Met      5 ED Visits in 12 Months    Security and Safety  No recent Security Events currently on file    ED Care Guidelines  There are currently no ED Care Guidelines for this patient. Please check your facility's medical records system.        Prescription Monitoring Program  120??- Narcotic Use Score  060??- Sedative Use Score  000??- Stimulant Use Score  310??- Overdose Risk Score  - All Scores range from 000-999 with 75% of the population scoring < 200 and on 1% scoring above 650  - The last digit of the narcotic, sedative, and stimulant score indicates the number of active prescriptions of that type  - Higher Use scores correlate with increased prescribers, pharmacies, mg equiv, and overlapping prescriptions  - Higher Overdose Risk Scores correlate with increased risk of unintentional overdose death   Concerning or unexpectedly high scores should prompt a review of the PMP record; this does not constitute checking PMP for prescribing purposes.      E.D. Visit Count (12 mo.)  Facility Visits   [facility] 1   [facility] 2   [place] [facility] 1   [place] - [facility] 4   [facility] 1   Total 9   Note: Visits indicate total known visits.     Recent Emergency Department Visit Summary  Date Facility [facility] Type Diagnoses or Chief Complaint   Oct 21, 2020 [place] [facility] [place] [facility]      Leg Pain      Aug 20, 2020 [place] [facility] [place] [facility] [place] [facility]      anxiety      Acute combined systolic (congestive) and diastolic (congestive) heart failure      Aug 20, 2020 [place] [facility] [place] [facility] [place] [facility]      difficulty breath; heart failure      difficulty breath; heart failure;  asthma      Congestive Heart Failure      Edema, unspecified      Aug 10, 2020 [place] [facility] [place] [facility] [place] [facility]      sob; difficulty breathing; diabetic; chest pain      sob; difficulty breathing; diabetic; chest pain; emesis      Emesis      Chest Pain      Shortness of Breath      Acute kidney failure, unspecified      Aug 09, 2020 [place] [facility] [place] [facility] [place] [facility]      nausea      Abdominal Cramping      Emesis      Jun 22, 2020 [facility] Staff. [place] [facility]      Mental Health      Mental Health Problem      Hyperglycemia, unspecified      Major depressive disorder, single episode, unspecified      Jun 14, 2020 [facility]. [place] [facility]      Dorsalgia, unspecified      Type 2 diabetes mellitus with hyperglycemia      Long term (current) use of insulin      May 29, 2020 [facility]  [facility]      Nondisplaced fracture of fifth metatarsal bone, left foot, initial encounter for closed fracture      Fall on same level from slipping, tripping and stumbling without subsequent striking against object, initial encounter      Oct 25, 2019 [place] [facility] Rosemarie Ax. [place] [facility]      EMS      Syncope      Syncope and collapse      Dizziness and giddiness          Recent Inpatient Visit Summary  Date Facility [facility] Type Diagnoses or Chief Complaint   Aug 10, 2020 [place] [facility] [place] [facility]      Adverse effect of unspecified drugs, medicaments and biological substances, initial encounter      Acute kidney failure, unspecified      Hyperglycemia, unspecified      Unspecified asthma with (acute) exacerbation      Dec 26, 2019 [facility] [place] 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)      Obstructive sleep apnea (adult) (pediatric)          Care Team  Provider Specialty Phone Fax Service Dates   [facility] Clinic/Center: [facility]) [phone] Current    Ricard Dillon , MD Internal Medicine [phone] Current      Collective Portal  This patient has registered at the [facility] Emergency Department   For more information visit: [email or web address]     PLEASE NOTE:     1.   Any care recommendations and other clinical information are provided as guidelines or for historical purposes only, and providers should exercise their own clinical judgment when providing care.    2.   You may only use this information for purposes of treatment, payment or health care operations activities, and subject to the limitations of applicable Collective Policies.    3.   You should consult directly with the organization that provided a care guideline or other clinical history with any questions about additional information or accuracy or completeness of information provided.    ? 2021 [place], [employer] - [email or web address]

## 2020-10-21 NOTE — Discharge Instructions (Signed)
Crutch Use (Edu)    To Stand:   Put crutches together on the side of the weak or injured leg. Hold on to hand grips with the hand closest to the crutches.   Push up with the other hand against the arm of the chair. Stand up.   Adjust crutches under each arm. The top of each crutch should rest against the sides of the chest wall. Push down on the hand grips. Don t lean on top of the crutches. This puts pressure on nerves in the armpit. This can lead to weakness and nerve damage.    To Walk:   Put crutches 8-12 inches (20-30 cm) in front of you and around 6-8 inches (15-20 cm) to the side of the toes.   Keep the weak / injured leg off the floor as you push down on the hand grips. Step through the crutches with the other leg.   Move crutches forward. Repeat.    If you can put some weight on the injured leg (partial weight bearing):   Put crutches 8-12 inches (20-30 cm) in front of you and around 6-8 inches (15-20 cm) to the side of the toes.   Put partial weight on the weak / injured leg. If allowed, put as much weight as you can bear.   Push down on the hand grips. Step through with the strong leg.   Move crutches forward. Repeat. Move the crutches and weak / injured leg forward together.    DO NOT place weight on the cast or splint unless you were told to.

## 2020-10-21 NOTE — ED Triage Notes (Signed)
to ED with complaints of LLE pain, swelling, and redness beginning one week ago. Pt reports foot fx back in June, unable to have surgery, has been in a walking boot since then. Hx DVT and PE. Denies SOB, CP. On PO Eliquis, taking as prescribed.

## 2020-12-21 DIAGNOSIS — D509 Iron deficiency anemia, unspecified: Secondary | ICD-10-CM | POA: Insufficient documentation

## 2020-12-25 ENCOUNTER — Other Ambulatory Visit: Payer: Self-pay | Admitting: Internal Medicine

## 2020-12-25 DIAGNOSIS — Z800 Family history of malignant neoplasm of digestive organs: Secondary | ICD-10-CM

## 2020-12-25 DIAGNOSIS — N63 Unspecified lump in unspecified breast: Secondary | ICD-10-CM

## 2021-01-12 ENCOUNTER — Other Ambulatory Visit: Payer: Self-pay | Admitting: Internal Medicine

## 2021-01-12 DIAGNOSIS — Z800 Family history of malignant neoplasm of digestive organs: Secondary | ICD-10-CM

## 2021-01-12 DIAGNOSIS — N63 Unspecified lump in unspecified breast: Secondary | ICD-10-CM

## 2021-01-21 ENCOUNTER — Encounter (INDEPENDENT_AMBULATORY_CARE_PROVIDER_SITE_OTHER): Payer: Self-pay | Admitting: Cardiovascular Disease

## 2021-01-21 ENCOUNTER — Ambulatory Visit (INDEPENDENT_AMBULATORY_CARE_PROVIDER_SITE_OTHER): Payer: BC Managed Care – PPO | Admitting: Cardiovascular Disease

## 2021-01-21 VITALS — BP 122/84 | HR 93 | Ht 66.0 in | Wt 277.3 lb

## 2021-01-21 DIAGNOSIS — Z794 Long term (current) use of insulin: Secondary | ICD-10-CM

## 2021-01-21 DIAGNOSIS — E119 Type 2 diabetes mellitus without complications: Secondary | ICD-10-CM

## 2021-01-21 DIAGNOSIS — Z86711 Personal history of pulmonary embolism: Secondary | ICD-10-CM

## 2021-01-21 DIAGNOSIS — IMO0002 Reserved for concepts with insufficient information to code with codable children: Secondary | ICD-10-CM | POA: Insufficient documentation

## 2021-01-21 DIAGNOSIS — I5032 Chronic diastolic (congestive) heart failure: Secondary | ICD-10-CM

## 2021-01-21 DIAGNOSIS — I110 Hypertensive heart disease with heart failure: Secondary | ICD-10-CM

## 2021-01-21 DIAGNOSIS — E1122 Type 2 diabetes mellitus with diabetic chronic kidney disease: Secondary | ICD-10-CM | POA: Insufficient documentation

## 2021-01-21 DIAGNOSIS — G4733 Obstructive sleep apnea (adult) (pediatric): Secondary | ICD-10-CM

## 2021-01-21 DIAGNOSIS — I10 Essential (primary) hypertension: Secondary | ICD-10-CM

## 2021-01-21 DIAGNOSIS — I5041 Acute combined systolic (congestive) and diastolic (congestive) heart failure: Secondary | ICD-10-CM

## 2021-01-21 DIAGNOSIS — E11649 Type 2 diabetes mellitus with hypoglycemia without coma: Secondary | ICD-10-CM

## 2021-01-21 DIAGNOSIS — Z7901 Long term (current) use of anticoagulants: Secondary | ICD-10-CM

## 2021-01-21 DIAGNOSIS — N1832 Long term (current) use of insulin: Secondary | ICD-10-CM

## 2021-01-21 LAB — ECG 12-LEAD
Atrial Rate: 91 {beats}/min
P Axis: 46 degrees
P-R Interval: 150 ms
Q-T Interval: 378 ms
QRS Duration: 80 ms
QTC Calculation (Bezet): 464 ms
R Axis: 12 degrees
T Axis: 38 degrees
Ventricular Rate: 91 {beats}/min

## 2021-01-21 NOTE — Progress Notes (Signed)
CARDIOLOGY OFFICE NOTE     Date:  01/21/2021      [name]  DOB: 1980/11/25  MRN: [id number]    PCP: Nash Mantis, MD    PROBLEM LIST     1. Insulin-requiring diabetes with diabetic neuropathy, last HbA1c >14%  2. Hypertension  3. Morbid obesity, BMI 45.2  4. OSA, not on CPAP  5. Stage III CKD  6. PE diagnosed in 08/2020, on apixaban   7. Asthma    HISTORY OF PRESENT ILLNESS     I had the pleasure of seeing [name] in the cardiology office today.  She is a 40 y.o. female with the above medical history here for CV consultation.  She has history of chronic diastolic CHF in the past which was diagnosed while she lived in [place], [place].  She has recently moved from [place] and establishing care in the area.  Reports doing well from a cardiac standpoint.  She mentions she is under a lot of stress.  She has lost over 40 pounds.  She mentions that her diabetes control has been a challenge because she needs steroids for her asthma from time to time.  She is interested in being referred to endocrinology as she could not get an appointment with the endocrinologist she was referred to by her PCP.     She reports no exertional chest discomfort.  She denies exertional dyspnea.  Denies orthopnea or PND. No peripheral edema. No palpitations, lightheadedness or syncope.     She does adhere to a low-sodium diet.  She takes her medications regularly and is tolerating them. She does regularly check her BP at home.  Her BP is well controlled.      She  has had no recent hospital admissions or emergency room visits.    REVIEW OF SYSTEMS   All other systems are negative.    MEDICATIONS/ALLERGY     Current Outpatient Medications   Medication Sig Dispense Refill   . albuterol sulfate HFA (PROVENTIL) 108 (90 Base) MCG/ACT inhaler Inhale 2 puffs into the lungs every 4 (four) hours as needed for Wheezing     . Alcohol Swabs Pads Use as indicated for insulin injections 120 each 3   . apixaban (ELIQUIS) 5 MG Take 5 mg  by mouth every 12 (twelve) hours     . aspirin 81 MG chewable tablet Chew 81 mg by mouth daily     . atorvastatin (LIPITOR) 20 MG tablet Take 20 mg by mouth nightly     . baclofen (LIORESAL) 10 MG tablet Take 10 mg by mouth as needed     . bumetanide (BUMEX) 2 MG tablet Take 1 tablet (2 mg total) by mouth 2 (two) times daily 60 tablet 0   . busPIRone HCl (BUSPAR PO) Take 25 mg by mouth daily     . carvedilol (COREG) 25 MG tablet Take 25 mg by mouth 2 (two) times daily     . folic acid (FOLVITE) 1 MG tablet Take 1 mg by mouth daily     . gabapentin (NEURONTIN) 600 MG tablet Take 600 mg by mouth 3 (three) times daily     . hydrALAZINE (APRESOLINE) 50 MG tablet Take 50 mg by mouth 2 (two) times daily     . hydrOXYzine (ATARAX) 10 MG tablet Take 10 mg by mouth every 6 (six) hours as needed for Anxiety     . insulin detemir (Levemir) 100 UNIT/ML injection Inject 50 Units into the skin every 12 (twelve)  hours 30 mL 1   . insulin lispro (HumaLOG) 100 UNIT/ML injection Inject 25 Units into the skin 3 (three) times daily before meals 30 mL 3   . Insulin Syringe-Needle U-100 27G X 1/2" 1 ML Misc Inject 1 Dose into the skin 4 (four) times daily 500 each 3   . liraglutide (VICTOZA) 18 MG/3ML injection Inject 0.6 mg into the skin daily 3 mL 2   . meclizine (ANTIVERT) 25 MG tablet Take 25 mg by mouth as needed     . montelukast (SINGULAIR) 10 MG tablet Take 10 mg by mouth nightly     . NON FORMULARY daily BRIO     . olmesartan (BENICAR) 40 MG tablet Take 40 mg by mouth daily     . omeprazole (PriLOSEC) 40 MG capsule Take 40 mg by mouth nightly     . topiramate (TOPAMAX) 100 MG tablet Take 100 mg by mouth daily     . valACYclovir (VALTREX) 1000 MG tablet Take 1 g by mouth as needed        Allergies   Allergen Reactions   . Bactrim [Sulfamethoxazole-Trimethoprim] Hives, Respiratory Distress and Edema   . Glipizide Edema     Throat swelling   . Lisinopril Edema     Tongue swelling       PAST MEDICAL/SURGICAL HISTORY     Past Medical  History:   Diagnosis Date   . Arthritis    . Asthma    . Chronic kidney disease    . Congestive heart failure    . Diabetes mellitus    . Fibromyalgia    . Hyperlipidemia    . Hypertension    . Neuropathy    . Pulmonary embolism 08/2019   . Vertigo      Past Surgical History:   Procedure Laterality Date   . HYSTERECTOMY  12/2019       SOCIAL HISTORY     Social History     Tobacco Use   . Smoking status: Never Smoker   . Smokeless tobacco: Never Used   Substance Use Topics   . Alcohol use: Never       FAMILY HISTORY   family history includes Asthma in her father and son; Cancer in her maternal aunt and maternal grandmother; Diabetes in her daughter and mother; Heart disease in her mother; Hypertension in her father and mother.    PHYSICAL EXAM   Blood pressure 122/84, pulse 93, height 1.676 m (5\' 6" ), weight 125.8 kg (277 lb 4.8 oz), SpO2 98 %.  @VSRANGES @  BP Readings from Last 3 Encounters:   01/21/21 122/84   10/21/20 142/76   08/22/20 121/65     Wt Readings from Last 3 Encounters:   01/21/21 125.8 kg (277 lb 4.8 oz)   10/21/20 127 kg (280 lb)   08/22/20 141.2 kg (311 lb 4.6 oz)      Body mass index is 44.76 kg/m.   General: not in distress  Head: NC/AT  Eyes: no conjunctival pallor, no icterus   ENMT: MMM   Neck: No thyromegaly  Respiratory: clear to auscultation, no rales or wheezes  Cardiovascular: No JVD. No carotid bruits. Normal rate, regular rhythm. Normal S1 and S2; no heaves or lifts; No murmurs, no gallops or rubs; PMI non-displaced   GI: soft, non-tender  Ext: warm, no LE edema, no cyanosis   Skin: warm and dry, no rashes.   Neuro/Psych: alert and oriented x 3, normal affect, no focal deficits  LAB RESULTS     Basic Metabolic Profile   Lab Results   Component Value Date    NA 137 08/22/2020    K 4.7 08/22/2020    BUN 51.0 (H) 08/22/2020    CREAT 1.5 (H) 08/22/2020    MG 2.1 08/13/2020    CA 9.0 08/22/2020    GLU 230 (H) 08/22/2020         Cardiac Biomarkers   Lab Results   Component Value Date     BNP 15 08/20/2020        CBC with Diff   Lab Results   Component Value Date    WBC 12.14 (H) 08/22/2020    HGB 7.7 (L) 08/22/2020    HCT 26.2 (L) 08/22/2020    PLT 379 (H) 08/22/2020         Cholesterol Panel   Lab Results   Component Value Date    CHOL 175 08/11/2020    HDL 36 (L) 08/11/2020    LDL 122 (H) 08/11/2020    TRIG 86 08/11/2020         Endocrine   Lab Results   Component Value Date    HGBA1C >14.0 (H) 08/11/2020    TSH 0.64 08/11/2020         Coagulation Studies   Lab Results   Component Value Date    INR 1.2 (H) 08/11/2020    DDIMER 0.23 08/10/2020         CARDIAC DIAGNOSTICS   HISTO  ECG (indepedently reviewed by me): Normal sinus rhythm, minimal voltage criteria for LVH 01/21/2021    TTE: 06/2018 [facility], [place]  LVEF 55-59%  Grade 1 diastolic dysfunction  Borderline LVH     PFT: 07/2013 [facility]  Restrictive lung disease secondary to obesity.     I have reviewed the above studies/reports.  ORY   ASSESSMENT AND PLAN     H/o chronic HFpEF: NYHA class I-II.   Patient appears euvolemic on exam. Tolerating medications well and compliant. BP control adequate. She is currently on bumex  - Continue diuretic.   - Will obtain TTE to assess cardiac structures and LVEF  - Discussed strict BP control, dietary sodium (<2g/day) & fluid restriction and medication compliance.   - Discussed measuring weight every morning and taking an extra dose of her diuretic if weight increases by >2 lb overnight or >5 lb over previous 1 week.    Essential Hypertension -  adequately controlled  - Continue her current regimen of olmesartan and hydralazine  - Encouraged pt to bring BP log on next office visit   - Emphasized salt restriction and exercise and medication adherence.    Insulin-requiring type 2 Diabetes Mellitus - Last HbA1c >14. Managed by her PCP  - Will refer to endocrinology  - Patient was instructed on importance of medication adherence, strict glycemic control, BP monitoring,  diabetic diet and exercise as well as proper foot care.    History of PE-diagnosed in October 2021.  On apixaban    Morbid obesity - Body mass index is 44.76 kg/m.  - We discussed about lifestyle modifications including exercise, dietary options, portion control       OSA - referral to sleep medicine provided    Orders Placed This Encounter   Procedures   . Neurology Referral: Eleanora Neighbor. Stacy Gardner, MD [facility])   . Ambulatory referral to Endocrinology   . ECG 12 lead (Normal)   . Transthoracic Echocardiogram (TTE)  Return in about 3 months (around 04/23/2021).  _____________________________    Alinda Money, MD, MPH, [facility]  Cardiology - [facility]    Tel.: [phone], Fax: [phone]  [address], [place], [place] [address]  [address], [place], [place] [address]  [address], [place], [place] [address]  _____________________________    This note was generated by the Epic EMR system/Dragon speech recognition and may contain inherent errors or omissions not intended by the user. Grammatical errors, random word insertions, deletions and pronoun errors  are occasional consequences of this technology due to software limitations. Not all errors are caught or corrected. If there are questions or concerns about the content of this note or information contained within the body of this dictation they should be addressed directly with the author for clarification.

## 2021-01-28 ENCOUNTER — Ambulatory Visit (INDEPENDENT_AMBULATORY_CARE_PROVIDER_SITE_OTHER): Payer: BC Managed Care – PPO | Admitting: Cardiovascular Disease

## 2021-02-11 DIAGNOSIS — F32A Depression, unspecified: Secondary | ICD-10-CM | POA: Insufficient documentation

## 2021-02-12 ENCOUNTER — Ambulatory Visit: Payer: BC Managed Care – PPO | Attending: Internal Medicine

## 2021-02-12 ENCOUNTER — Ambulatory Visit: Payer: BC Managed Care – PPO

## 2021-02-12 DIAGNOSIS — N63 Unspecified lump in unspecified breast: Secondary | ICD-10-CM | POA: Insufficient documentation

## 2021-02-12 DIAGNOSIS — Z800 Family history of malignant neoplasm of digestive organs: Secondary | ICD-10-CM | POA: Insufficient documentation

## 2021-02-12 DIAGNOSIS — N6311 Unspecified lump in the right breast, upper outer quadrant: Secondary | ICD-10-CM | POA: Insufficient documentation

## 2021-02-19 ENCOUNTER — Other Ambulatory Visit (INDEPENDENT_AMBULATORY_CARE_PROVIDER_SITE_OTHER): Payer: BC Managed Care – PPO

## 2021-02-22 ENCOUNTER — Telehealth (INDEPENDENT_AMBULATORY_CARE_PROVIDER_SITE_OTHER): Payer: Self-pay | Admitting: Cardiovascular Disease

## 2021-03-06 ENCOUNTER — Ambulatory Visit (INDEPENDENT_AMBULATORY_CARE_PROVIDER_SITE_OTHER): Payer: BC Managed Care – PPO | Admitting: Internal Medicine

## 2021-03-18 ENCOUNTER — Emergency Department: Payer: BC Managed Care – PPO

## 2021-03-18 ENCOUNTER — Emergency Department
Admission: EM | Admit: 2021-03-18 | Discharge: 2021-03-18 | Disposition: A | Discharge status: Home / Self Care | Payer: BC Managed Care – PPO | Attending: Emergency Medicine | Admitting: Emergency Medicine

## 2021-03-18 DIAGNOSIS — J45901 Unspecified asthma with (acute) exacerbation: Secondary | ICD-10-CM | POA: Insufficient documentation

## 2021-03-18 DIAGNOSIS — Z20822 Contact with and (suspected) exposure to covid-19: Secondary | ICD-10-CM | POA: Insufficient documentation

## 2021-03-18 DIAGNOSIS — R0602 Shortness of breath: Secondary | ICD-10-CM

## 2021-03-18 DIAGNOSIS — N289 Disorder of kidney and ureter, unspecified: Secondary | ICD-10-CM | POA: Insufficient documentation

## 2021-03-18 DIAGNOSIS — R059 Cough, unspecified: Secondary | ICD-10-CM | POA: Insufficient documentation

## 2021-03-18 LAB — COMPREHENSIVE METABOLIC PANEL
ALT: 14 U/L (ref 0–55)
AST (SGOT): 12 U/L (ref 5–34)
Albumin/Globulin Ratio: 0.9 (ref 0.9–2.2)
Albumin: 3.4 g/dL — ABNORMAL LOW (ref 3.5–5.0)
Alkaline Phosphatase: 79 U/L (ref 37–117)
Anion Gap: 8 (ref 5.0–15.0)
BUN: 55 mg/dL — ABNORMAL HIGH (ref 7.0–19.0)
Bilirubin, Total: 0.2 mg/dL (ref 0.2–1.2)
CO2: 26 mEq/L (ref 22–29)
Calcium: 8.4 mg/dL — ABNORMAL LOW (ref 8.5–10.5)
Chloride: 103 mEq/L (ref 100–111)
Creatinine: 2.1 mg/dL — ABNORMAL HIGH (ref 0.6–1.0)
Globulin: 3.6 g/dL (ref 2.0–3.6)
Glucose: 238 mg/dL — ABNORMAL HIGH (ref 70–100)
Potassium: 5.1 mEq/L (ref 3.5–5.1)
Protein, Total: 7 g/dL (ref 6.0–8.3)
Sodium: 137 mEq/L (ref 136–145)

## 2021-03-18 LAB — GFR: EGFR: 25.9

## 2021-03-18 LAB — CBC AND DIFFERENTIAL
Absolute NRBC: 0 10*3/uL (ref 0.00–0.00)
Basophils Absolute Automated: 0.04 10*3/uL (ref 0.00–0.08)
Basophils Automated: 0.4 %
Eosinophils Absolute Automated: 0.29 10*3/uL (ref 0.00–0.44)
Eosinophils Automated: 2.9 %
Hematocrit: 31 % — ABNORMAL LOW (ref 34.7–43.7)
Hgb: 9.4 g/dL — ABNORMAL LOW (ref 11.4–14.8)
Immature Granulocytes Absolute: 0.04 10*3/uL (ref 0.00–0.07)
Immature Granulocytes: 0.4 %
Lymphocytes Absolute Automated: 3.03 10*3/uL (ref 0.42–3.22)
Lymphocytes Automated: 30.8 %
MCH: 26.3 pg (ref 25.1–33.5)
MCHC: 30.3 g/dL — ABNORMAL LOW (ref 31.5–35.8)
MCV: 86.8 fL (ref 78.0–96.0)
MPV: 10.3 fL (ref 8.9–12.5)
Monocytes Absolute Automated: 0.85 10*3/uL (ref 0.21–0.85)
Monocytes: 8.6 %
Neutrophils Absolute: 5.6 10*3/uL (ref 1.10–6.33)
Neutrophils: 56.9 %
Nucleated RBC: 0 /100 WBC (ref 0.0–0.0)
Platelets: 410 10*3/uL — ABNORMAL HIGH (ref 142–346)
RBC: 3.57 10*6/uL — ABNORMAL LOW (ref 3.90–5.10)
RDW: 13 % (ref 11–15)
WBC: 9.85 10*3/uL — ABNORMAL HIGH (ref 3.10–9.50)

## 2021-03-18 LAB — COVID-19 (SARS-COV-2) & INFLUENZA  A/B, NAA (ROCHE LIAT)
Influenza A: NOT DETECTED
Influenza B: NOT DETECTED
SARS CoV 2 Overall Result: NOT DETECTED

## 2021-03-18 LAB — HCG QUANTITATIVE: hCG, Quant.: <2.4

## 2021-03-18 LAB — IHS D-DIMER: D-Dimer: 0.83 ug/mL FEU — ABNORMAL HIGH (ref 0.00–0.60)

## 2021-03-18 LAB — B-TYPE NATRIURETIC PEPTIDE: B-Natriuretic Peptide: <10 pg/mL (ref 0–100)

## 2021-03-18 LAB — GLUCOSE WHOLE BLOOD - POCT: Whole Blood Glucose POCT: 82 mg/dL (ref 70–100)

## 2021-03-18 MED ORDER — TECHNETIUM TC 99M ALBUMIN AGGREGATED
5.0000 | Freq: Once | Status: AC | PRN
Start: 2021-03-18 — End: 2021-03-18
  Administered 2021-03-18: 5 via INTRAVENOUS

## 2021-03-18 MED ORDER — SODIUM CHLORIDE 0.9 % IV BOLUS
500.0000 mL | Freq: Once | INTRAVENOUS | Status: AC
Start: 2021-03-18 — End: 2021-03-18
  Administered 2021-03-18: 500 mL via INTRAVENOUS

## 2021-03-18 MED ORDER — ALBUTEROL SULFATE HFA 108 (90 BASE) MCG/ACT IN AERS
2.0000 | INHALATION_SPRAY | RESPIRATORY_TRACT | 0 refills | Status: AC | PRN
Start: 2021-03-18 — End: ?

## 2021-03-18 MED ORDER — ALBUTEROL SULFATE (2.5 MG/3ML) 0.083% IN NEBU
7.5000 mg | INHALATION_SOLUTION | Freq: Once | RESPIRATORY_TRACT | Status: AC
Start: 2021-03-18 — End: 2021-03-18
  Administered 2021-03-18: 7.5 mg via RESPIRATORY_TRACT
  Filled 2021-03-18: qty 9

## 2021-03-18 MED ORDER — MAGNESIUM SULFATE IN D5W 1-5 GM/100ML-% IV SOLN
1.0000 g | Freq: Once | INTRAVENOUS | Status: AC
Start: 2021-03-18 — End: 2021-03-18
  Administered 2021-03-18: 1 g via INTRAVENOUS
  Filled 2021-03-18: qty 100

## 2021-03-18 MED ORDER — IPRATROPIUM BROMIDE 0.02 % IN SOLN
0.5000 mg | Freq: Once | RESPIRATORY_TRACT | Status: AC
Start: 2021-03-18 — End: 2021-03-18
  Administered 2021-03-18: 0.5 mg via RESPIRATORY_TRACT
  Filled 2021-03-18: qty 2.5

## 2021-03-18 MED ORDER — ALBUTEROL SULFATE (2.5 MG/3ML) 0.083% IN NEBU
7.5000 mg | INHALATION_SOLUTION | Freq: Once | RESPIRATORY_TRACT | Status: AC
Start: 2021-03-18 — End: 2021-03-18
  Administered 2021-03-18: 7.5 mg via RESPIRATORY_TRACT
  Filled 2021-03-18 (×2): qty 9

## 2021-03-18 MED ORDER — ALBUTEROL SULFATE (2.5 MG/3ML) 0.083% IN NEBU
2.5000 mg | INHALATION_SOLUTION | RESPIRATORY_TRACT | 0 refills | Status: DC | PRN
Start: 2021-03-18 — End: 2021-04-17

## 2021-03-18 NOTE — EDIE (Signed)
COLLECTIVE?NOTIFICATION?03/18/2021 09:39?Currie Paris?MRN: [id number]    Criteria Met      5 ED Visits in 12 Months    Security and Safety  No recent Security Events currently on file    ED Care Guidelines  There are currently no ED Care Guidelines for this patient. Please check your facility's medical records system.        Prescription Monitoring Program  130??- Narcotic Use Score  170??- Sedative Use Score  000??- Stimulant Use Score  310??- Overdose Risk Score  - All Scores range from 000-999 with 75% of the population scoring < 200 and on 1% scoring above 650  - The last digit of the narcotic, sedative, and stimulant score indicates the number of active prescriptions of that type  - Higher Use scores correlate with increased prescribers, pharmacies, mg equiv, and overlapping prescriptions  - Higher Overdose Risk Scores correlate with increased risk of unintentional overdose death   Concerning or unexpectedly high scores should prompt a review of the PMP record; this does not constitute checking PMP for prescribing purposes.      E.D. Visit Count (12 mo.)  Facility Visits   [facility] 2   [place] [facility] 1   [place] [facility] 5   [facility] 1   Total 9   Note: Visits indicate total known visits.     Recent Emergency Department Visit Summary  Date Facility [facility] Type Diagnoses or Chief Complaint   Mar 18, 2021 [place] [facility] [place] [facility] [place] [facility]      sob;asthma      Oct 21, 2020 [place] [facility] [place] [facility] [place] [facility]      Leg Pain      Leg Pain; Fever      Pain in left foot      Unspecified fracture of left foot, subsequent encounter for fracture with nonunion      Unspecified fracture of left foot, initial encounter for closed fracture      Aug 20, 2020 [place] [facility] [place] [facility] [place] [facility]      anxiety      Acute combined systolic (congestive) and diastolic (congestive) heart failure      Aug 20, 2020 [place] [facility]  [place] [facility] [place] [facility]      difficulty breath; heart failure      difficulty breath; heart failure; asthma      Congestive Heart Failure      Edema, unspecified      Aug 10, 2020 [place] [facility] [place] [facility] [place] [facility]      sob; difficulty breathing; diabetic; chest pain      sob; difficulty breathing; diabetic; chest pain; emesis      Emesis      Chest Pain      Shortness of Breath      Acute kidney failure, unspecified      Aug 09, 2020 [place] [facility] [place] [facility] [place] [facility]      nausea      Abdominal Cramping      Emesis      Jun 22, 2020 [facility] -[facility]. [facility]. [place] [facility]      Mental Health      Mental Health Problem      Hyperglycemia, unspecified      Major depressive disorder, single episode, unspecified      Jun 14, 2020 [facility]. [place] [facility]  Dorsalgia, unspecified      Type 2 diabetes mellitus with hyperglycemia      Long term (current) use of insulin      May 29, 2020 [facility]. [facility]marie Ax. [place] [facility]      Nondisplaced fracture of fifth metatarsal bone, left foot, initial encounter for closed fracture      Fall on same level from slipping, tripping and stumbling without subsequent striking against object, initial encounter          Recent Inpatient Visit Summary  Date Facility [facility] Type Diagnoses or Chief Complaint   Aug 10, 2020 [place] [facility] [place] [facility]      Adverse effect of unspecified drugs, medicaments and biological substances, initial encounter      Acute kidney failure, unspecified      Hyperglycemia, unspecified      Unspecified asthma with (acute) exacerbation          Care Team  Provider Specialty Phone Fax Service Dates   [facility] Clinic/Center: [facility]) [phone] Current    Ricard Dillon , MD Internal Medicine [phone] Current       Collective Portal  This patient has registered at the [facility] Emergency Department   For more information visit: [email or web address]     PLEASE NOTE:     1.   Any care recommendations and other clinical information are provided as guidelines or for historical purposes only, and providers should exercise their own clinical judgment when providing care.    2.   You may only use this information for purposes of treatment, payment or health care operations activities, and subject to the limitations of applicable Collective Policies.    3.   You should consult directly with the organization that provided a care guideline or other clinical history with any questions about additional information or accuracy or completeness of information provided.    ? 2022 [place], [employer] - [email or web address]

## 2021-03-18 NOTE — ED Triage Notes (Signed)
[name] is a 40 y.o. female who presents to ED with c/o asthma exacerbation for the past 3 days, no improvement with home inhalers or nebulizer, last used at 0600. Speaking in full sentences, hoarseness noted to voice. No fevers or CP reported.     BP 130/72   Pulse 91   Temp 98.4 F (36.9 C) (Oral)   Resp 20   Ht 5\' 6"  (1.676 m)   Wt 122.5 kg   SpO2 100%   BMI 43.58 kg/m

## 2021-03-18 NOTE — ED Provider Notes (Signed)
EMERGENCY DEPARTMENT HISTORY AND PHYSICAL EXAM     None        Date: 03/18/2021  Patient Name: [name]    History of Presenting Illness     Chief Complaint   Patient presents with   . Shortness of Breath   . Cough       History Provided By: Patient    Chief Complaint: SOB  Onset: 1 week  Timing: Constant  Quality: trouble breathing  Severity: Moderate  Exacerbating factors: none  Alleviating factors: none    [name] is a 40 y.o. female presenting to the ED with PMHx asthma, hx PE on eloquis, arthritis, CKD, CHF, DM, fibromyalgia, HTN, HLD, c/o SOB cx 1 week. Pt states it initially felt like her asthma but now feels worse. Neb helps slightly but still SOB. Denies CP. + cough. No fever. No LE edema.     PCP: Octavia Bruckner, MD  SPECIALISTS:    No current facility-administered medications for this encounter.     Current Outpatient Medications   Medication Sig Dispense Refill   . albuterol (PROVENTIL) (2.5 MG/3ML) 0.083% nebulizer solution Take 3 mLs (2.5 mg total) by nebulization every 4 (four) hours as needed for Wheezing or Shortness of Breath (coughing) 30 mL 0   . albuterol sulfate HFA (PROVENTIL) 108 (90 Base) MCG/ACT inhaler Inhale 2 puffs into the lungs every 4 (four) hours as needed for Wheezing 1 each 0   . Alcohol Swabs Pads Use as indicated for insulin injections 120 each 3   . apixaban (ELIQUIS) 5 MG Take 5 mg by mouth every 12 (twelve) hours     . aspirin 81 MG chewable tablet Chew 81 mg by mouth daily     . atorvastatin (LIPITOR) 20 MG tablet Take 20 mg by mouth nightly     . baclofen (LIORESAL) 10 MG tablet Take 10 mg by mouth as needed     . bumetanide (BUMEX) 2 MG tablet Take 1 tablet (2 mg total) by mouth 2 (two) times daily 60 tablet 0   . busPIRone HCl (BUSPAR PO) Take 25 mg by mouth daily     . carvedilol (COREG) 25 MG tablet Take 25 mg by mouth 2 (two) times daily     . folic acid (FOLVITE) 1 MG tablet Take 1 mg by mouth daily     . gabapentin (NEURONTIN) 600 MG tablet Take 600 mg by  mouth 3 (three) times daily     . hydrALAZINE (APRESOLINE) 50 MG tablet Take 50 mg by mouth 2 (two) times daily     . hydrOXYzine (ATARAX) 10 MG tablet Take 10 mg by mouth every 6 (six) hours as needed for Anxiety     . insulin detemir (Levemir) 100 UNIT/ML injection Inject 50 Units into the skin every 12 (twelve) hours 30 mL 1   . insulin lispro (HumaLOG) 100 UNIT/ML injection Inject 25 Units into the skin 3 (three) times daily before meals 30 mL 3   . Insulin Syringe-Needle U-100 27G X 1/2" 1 ML Misc Inject 1 Dose into the skin 4 (four) times daily 500 each 3   . liraglutide (VICTOZA) 18 MG/3ML injection Inject 0.6 mg into the skin daily 3 mL 2   . meclizine (ANTIVERT) 25 MG tablet Take 25 mg by mouth as needed     . montelukast (SINGULAIR) 10 MG tablet Take 10 mg by mouth nightly     . NON FORMULARY daily BRIO     .  olmesartan (BENICAR) 40 MG tablet Take 40 mg by mouth daily     . omeprazole (PriLOSEC) 40 MG capsule Take 40 mg by mouth nightly     . topiramate (TOPAMAX) 100 MG tablet Take 100 mg by mouth daily     . valACYclovir (VALTREX) 1000 MG tablet Take 1 g by mouth as needed         Past History     Past Medical History:  Past Medical History:   Diagnosis Date   . Arthritis    . Asthma    . Chronic kidney disease    . Congestive heart failure    . Diabetes mellitus    . Fibromyalgia    . Hyperlipidemia    . Hypertension    . Neuropathy    . Pulmonary embolism 08/2019   . Vertigo        Past Surgical History:  Past Surgical History:   Procedure Laterality Date   . HYSTERECTOMY  12/2019       Family History:  Family History   Problem Relation Age of Onset   . Heart disease Mother    . Hypertension Mother    . Diabetes Mother    . Asthma Father    . Hypertension Father    . Diabetes Daughter    . Asthma Son    . Cancer Maternal Aunt    . Cancer Maternal Grandmother        Social History:  Social History     Tobacco Use   . Smoking status: Never Smoker   . Smokeless tobacco: Never Used   Vaping Use   . Vaping  Use: Never used   Substance Use Topics   . Alcohol use: Never   . Drug use: Never       Allergies:  Allergies   Allergen Reactions   . Bactrim [Sulfamethoxazole-Trimethoprim] Hives, Respiratory Distress and Edema   . Glipizide Edema     Throat swelling   . Lisinopril Edema     Tongue swelling       Review of Systems     Review of Systems   Constitutional: Negative for chills and fever.   HENT: Negative for congestion and sore throat.    Respiratory: Positive for cough, shortness of breath and wheezing.    Cardiovascular: Negative for chest pain.   Gastrointestinal: Negative for abdominal pain, nausea and vomiting.   Musculoskeletal: Negative for myalgias.   All other systems reviewed and are negative.      Physical Exam   BP 117/84   Pulse 90   Temp 98.4 F (36.9 C) (Oral)   Resp 16   Ht 5\' 6"  (1.676 m)   Wt 122.5 kg   SpO2 98%   BMI 43.58 kg/m     General: Well appearing, well developed in NAD.  Head: Normocephalic, atraumatic.  Eyes: No scleral icterus.  ENT: No stridor.  Lungs: No respiratory distress. Respirations even and non-labored. +frequent dry cough especially with deep breaths. +decreased breath sounds throughout, expiratory wheezes with deep breaths.   Heart: RRR. Normal peripheral perfusion. No peripheral edema.  Abdomen: Soft, non-tender. No rebound or guarding.  MSK: No obvious deformity  Neuro: Alert. No focal motor deficits. Clear speech.   Psych: Cooperative. Normal affect.     Diagnostic Study Results     Labs -     Results     Procedure Component Value Units Date/Time    Glucose Whole Blood - POCT [[id number]] Collected:  03/18/21 1542     Updated: 03/18/21 1544     Whole Blood Glucose POCT 82 mg/dL     B-type Natriuretic Peptide [[id number]] Collected: 03/18/21 1023    Specimen: Blood Updated: 03/18/21 1118     B-Natriuretic Peptide <10 pg/mL     Narrative:      Rescheduled by [id number] at 03/18/2021 10:22 Reason: Printed by   mistake/Printing Issues.    D-Dimer [[id number]]  (Abnormal)  Collected: 03/18/21 1023     Updated: 03/18/21 1111     D-Dimer 0.83 ug/mL FEU     Narrative:      Rescheduled by [id number] at 03/18/2021 10:22 Reason: Printed by   mistake/Printing Issues.    COVID-19 (SARS-CoV-2) and Influenza A/B, NAA ([facility] Rapid)- Age 15 and above [[id number]] Collected: 03/18/21 1037    Specimen: Culturette from Nasopharyngeal Updated: 03/18/21 1107     Purpose of COVID testing Diagnostic -[facility]I     SARS-CoV-2 Specimen Source Nasal Swab     SARS CoV 2 Overall Result Not Detected     Influenza A Not Detected     Influenza B Not Detected    Narrative:      o Collect and clearly label specimen type:  o PREFERRED-Upper respiratory specimen: One Nasal Swab in  Transport Media.  o Hand deliver to laboratory ASAP  Diagnostic -[facility]I    Beta HCG Quantitative [[id number]] Collected: 03/18/21 1023     Updated: 03/18/21 1056     hCG, Quant. <2.4    Narrative:      Rescheduled by [id number] at 03/18/2021 10:22 Reason: Printed by   mistake/Printing Issues.    Comprehensive metabolic panel [[id number]]  (Abnormal) Collected: 03/18/21 1023    Specimen: Blood Updated: 03/18/21 1055     Glucose 238 mg/dL      BUN 36.6 mg/dL      Creatinine 2.1 mg/dL      Sodium 440 mEq/L      Potassium 5.1 mEq/L      Chloride 103 mEq/L      CO2 26 mEq/L      Calcium 8.4 mg/dL      Protein, Total 7.0 g/dL      Albumin 3.4 g/dL      AST (SGOT) 12 U/L      ALT 14 U/L      Alkaline Phosphatase 79 U/L      Bilirubin, Total 0.2 mg/dL      Globulin 3.6 g/dL      Albumin/Globulin Ratio 0.9     Anion Gap 8.0    Narrative:      Rescheduled by [id number] at 03/18/2021 10:22 Reason: Printed by   mistake/Printing Issues.    GFR [[id number]] Collected: 03/18/21 1023     Updated: 03/18/21 1055     EGFR 25.9    Narrative:      Rescheduled by [id number] at 03/18/2021 10:22 Reason: Printed by   mistake/Printing Issues.    CBC and differential [[id number]]  (Abnormal) Collected: 03/18/21 1023    Specimen: Blood Updated: 03/18/21 1042     WBC 9.85 x10 3/uL      Hgb 9.4 g/dL       Hematocrit 95.1 %      Platelets 410 x10 3/uL      RBC 3.57 x10 6/uL      MCV 86.8 fL      MCH 26.3 pg      MCHC 30.3 g/dL      RDW 13 %  MPV 10.3 fL      Neutrophils 56.9 %      Lymphocytes Automated 30.8 %      Monocytes 8.6 %      Eosinophils Automated 2.9 %      Basophils Automated 0.4 %      Immature Granulocytes 0.4 %      Nucleated RBC 0.0 /100 WBC      Neutrophils Absolute 5.60 x10 3/uL      Lymphocytes Absolute Automated 3.03 x10 3/uL      Monocytes Absolute Automated 0.85 x10 3/uL      Eosinophils Absolute Automated 0.29 x10 3/uL      Basophils Absolute Automated 0.04 x10 3/uL      Immature Granulocytes Absolute 0.04 x10 3/uL      Absolute NRBC 0.00 x10 3/uL     Narrative:      Rescheduled by [id number] at 03/18/2021 10:22 Reason: Printed by   mistake/Printing Issues.          Radiologic Studies -   Radiology Results (24 Hour)     Procedure Component Value Units Date/Time    NM Lung Scan Perfusion Particulate [[id number]] Collected: 03/18/21 1304    Order Status: Completed Updated: 03/18/21 1307    Narrative:      History: Shortness of breath    Routine planar and SPECT-CT perfusion scan with 5.5 mCi technetium 99  MAA demonstrates homogeneous perfusion throughout both lungs.      Impression:       Study is considered low suspicion for the presence of  pulmonary embolus    Laurena Slimmer, MD   03/18/2021 1:04 PM    NM Spect CT Fusion Sgl Area Sgl Day [[id number]] Collected: 03/18/21 1304    Order Status: Completed Updated: 03/18/21 1307    Narrative:      History: Shortness of breath    Routine planar and SPECT-CT perfusion scan with 5.5 mCi technetium 99  MAA demonstrates homogeneous perfusion throughout both lungs.      Impression:       Study is considered low suspicion for the presence of  pulmonary embolus    Laurena Slimmer, MD   03/18/2021 1:04 PM    XR Chest 2 Views [[id number]] Collected: 03/18/21 1040    Order Status: Completed Updated: 03/18/21 1042    Narrative:      History: SOB    Technique: PA and  Lateral        Findings:  The lungs appear clear.  There is no pneumothorax.  The heart is normal in size.    The mediastinum is within normal limits.          Impression:       No active disease is seen in the chest.    Laurena Slimmer, MD   03/18/2021 10:40 AM      .    Medical Decision Making   I am the first provider for this patient.    I reviewed the vital signs, available nursing notes, past medical history, past surgical history, family history and social history.    Vital Signs-Reviewed the patient's vital signs.     Patient Vitals for the past 12 hrs:   BP Temp Pulse Resp   03/18/21 1700 117/84 -- 90 16   03/18/21 1630 130/63 -- 88 --   03/18/21 1530 143/73 -- 91 16   03/18/21 1500 138/78 -- 86 18   03/18/21 1243 145/85 -- 88 20   03/18/21 0945 130/72 98.4 F (  36.9 C) 91 20       Pulse Oximetry Analysis - Normal 100% on RA    EKG:  Interpreted by the EP.   Time Interpreted: 1306   Rate: 91bpm   Rhythm: Normal Sinus Rhythm    Interpretation: NSR, nonspecific t wave inversions lead aVL. No STEMI      Personal Protective Equipment (PPE)    Face Shield, Gloves, and N95      Old Medical Records: Old medical records.  Previous electrocardiograms.  Nursing notes.     ED Course: 11:30 Lungs with improved air movement, + expiratory wheezes throughout.     12:45 pt feeling okay, still some SOB. Lungs with good air movement, + scattered expiratory wheezes and dry cough with deep breaths.     15:40 Pt feeling better but still frequent cough and need to cough with deep breaths per pt. Pts lungs with good air movement, scattered expiratory wheezes throughout. O2 sat 100% on RA. Pt declines steroids. Will give magnesium and then pt thinks she will feel good enough to go home.     17:00 Pt feeling much better, SOB much improved. I agaiun offered steroids and/or admission and pt declines. Pt well appearing in NAD. Cough improved. Pts lungs with good air mvmt, +expiratory wheezes improved but still present. Discussed with pt  that if sx change or worsen she can come back anytime for steroids and/oir admission. Has been admitted in the past for asthma. No hx intubation for asthma.         Diagnosis     Clinical Impression:   1. Asthma with acute exacerbation, unspecified asthma severity, unspecified whether persistent    2. Renal insufficiency        Treatment Plan:   ED Disposition     ED Disposition   Discharge    Condition   --    Date/Time   Mon Mar 18, 2021  5:02 PM    Comment   [name] discharge to home/self care.    Condition at disposition: Stable                  Letta Median, [place]  03/18/21 1842       Dereck Leep, MD  03/20/21 1302

## 2021-03-19 LAB — ECG 12-LEAD
Atrial Rate: 91 {beats}/min
IHS MUSE NARRATIVE AND IMPRESSION: NORMAL
P Axis: 13 degrees
P-R Interval: 134 ms
Q-T Interval: 368 ms
QRS Duration: 80 ms
QTC Calculation (Bezet): 452 ms
R Axis: -17 degrees
T Axis: 98 degrees
Ventricular Rate: 91 {beats}/min

## 2021-03-25 ENCOUNTER — Telehealth: Payer: Self-pay | Admitting: Medical

## 2021-03-25 NOTE — Telephone Encounter (Signed)
I called pt to see how she is doing s/p d/c. Pt feels about the same, still has cough. She has appt with her pulmonologist this week. She still does not want steroids. I encouraged her to come back to ED if not feeling well. Pt understands.

## 2021-03-27 ENCOUNTER — Ambulatory Visit (INDEPENDENT_AMBULATORY_CARE_PROVIDER_SITE_OTHER): Payer: BC Managed Care – PPO

## 2021-03-27 DIAGNOSIS — I10 Essential (primary) hypertension: Secondary | ICD-10-CM

## 2021-03-28 LAB — ECHOCARDIOGRAM ADULT COMPLETE W CLR/ DOPP WAVEFORM
AV Area (Cont Eq VTI): 3.02
AV Area (Cont Eq VTI): 3.026
AV Mean Gradient: 4
AV Peak Velocity: 134
IVS Diastolic Thickness (2D): 0.129
IVS Diastolic Thickness (2D): 1.2
IVS Diastolic Thickness (2D): 1.62
IVS Diastolic Thickness (2D): 1.64
LA Dimension (2D): 3.3
LA Volume Index (BP A-L): 0.014
LVID diastole (2D): 3.29
LVID diastole (2D): 4.1
LVID systole (2D): 2.47
LVID systole (2D): 3.03
MV Area (PHT): 4.145
MV E/A: 0.8
MV E/A: 0.817
MV E/e' (Average): 13.501
Mitral Valve Findings: NORMAL
Prox Ascending Aorta Diameter: 3.5
RV Basal Diastolic Dimension: 3.36
RV Function: NORMAL
Site RA Size (AS): NORMAL
Site RV Size (AS): NORMAL
TAPSE: 2.47
Tricuspid Valve Findings: NORMAL

## 2021-04-02 DIAGNOSIS — K219 Gastro-esophageal reflux disease without esophagitis: Secondary | ICD-10-CM | POA: Insufficient documentation

## 2021-04-20 DIAGNOSIS — G4733 Obstructive sleep apnea (adult) (pediatric): Secondary | ICD-10-CM | POA: Insufficient documentation

## 2021-04-22 ENCOUNTER — Ambulatory Visit (INDEPENDENT_AMBULATORY_CARE_PROVIDER_SITE_OTHER): Payer: BC Managed Care – PPO | Admitting: Cardiovascular Disease

## 2021-04-29 ENCOUNTER — Emergency Department
Admission: EM | Admit: 2021-04-29 | Discharge: 2021-04-29 | Disposition: A | Discharge status: Home / Self Care | Payer: BC Managed Care – PPO | Attending: Emergency Medicine | Admitting: Emergency Medicine

## 2021-04-29 ENCOUNTER — Emergency Department: Payer: BC Managed Care – PPO

## 2021-04-29 DIAGNOSIS — R079 Chest pain, unspecified: Secondary | ICD-10-CM | POA: Insufficient documentation

## 2021-04-29 DIAGNOSIS — R7989 Other specified abnormal findings of blood chemistry: Secondary | ICD-10-CM | POA: Insufficient documentation

## 2021-04-29 DIAGNOSIS — R55 Syncope and collapse: Secondary | ICD-10-CM | POA: Insufficient documentation

## 2021-04-29 DIAGNOSIS — R42 Dizziness and giddiness: Secondary | ICD-10-CM

## 2021-04-29 DIAGNOSIS — Z20822 Contact with and (suspected) exposure to covid-19: Secondary | ICD-10-CM | POA: Insufficient documentation

## 2021-04-29 LAB — COMPREHENSIVE METABOLIC PANEL
ALT: 11 U/L (ref 0–55)
AST (SGOT): 11 U/L (ref 5–34)
Albumin/Globulin Ratio: 0.9 (ref 0.9–2.2)
Albumin: 3.5 g/dL (ref 3.5–5.0)
Alkaline Phosphatase: 97 U/L (ref 37–117)
Anion Gap: 10 (ref 5.0–15.0)
BUN: 66 mg/dL — ABNORMAL HIGH (ref 7.0–19.0)
Bilirubin, Total: 0.3 mg/dL (ref 0.2–1.2)
CO2: 23 mEq/L (ref 22–29)
Calcium: 9.3 mg/dL (ref 8.5–10.5)
Chloride: 105 mEq/L (ref 100–111)
Creatinine: 2 mg/dL — ABNORMAL HIGH (ref 0.6–1.0)
Globulin: 3.8 g/dL — ABNORMAL HIGH (ref 2.0–3.6)
Glucose: 217 mg/dL — ABNORMAL HIGH (ref 70–100)
Potassium: 4.8 mEq/L (ref 3.5–5.1)
Protein, Total: 7.3 g/dL (ref 6.0–8.3)
Sodium: 138 mEq/L (ref 136–145)

## 2021-04-29 LAB — MAN DIFF ONLY
Band Neutrophils Absolute: 0 10*3/uL (ref 0.00–1.00)
Band Neutrophils: 0 %
Basophils Absolute Manual: 0.11 10*3/uL — ABNORMAL HIGH (ref 0.00–0.08)
Basophils Manual: 1 %
Eosinophils Absolute Manual: 0.32 10*3/uL (ref 0.00–0.44)
Eosinophils Manual: 3 %
Lymphocytes Absolute Manual: 3.91 10*3/uL — ABNORMAL HIGH (ref 0.42–3.22)
Lymphocytes Manual: 37 %
Monocytes Absolute: 0.53 10*3/uL (ref 0.21–0.85)
Monocytes Manual: 5 %
Neutrophils Absolute Manual: 5.71 10*3/uL (ref 1.10–6.33)
Segmented Neutrophils: 54 %

## 2021-04-29 LAB — COVID-19 (SARS-COV-2): SARS CoV-2: NEGATIVE

## 2021-04-29 LAB — CBC AND DIFFERENTIAL
Absolute NRBC: 0 10*3/uL (ref 0.00–0.00)
Hematocrit: 30 % — ABNORMAL LOW (ref 34.7–43.7)
Hgb: 9.2 g/dL — ABNORMAL LOW (ref 11.4–14.8)
MCH: 26 pg (ref 25.1–33.5)
MCHC: 30.7 g/dL — ABNORMAL LOW (ref 31.5–35.8)
MCV: 84.7 fL (ref 78.0–96.0)
MPV: 9.9 fL (ref 8.9–12.5)
Nucleated RBC: 0 /100 WBC (ref 0.0–0.0)
Platelets: 455 10*3/uL — ABNORMAL HIGH (ref 142–346)
RBC: 3.54 10*6/uL — ABNORMAL LOW (ref 3.90–5.10)
RDW: 12 % (ref 11–15)
WBC: 10.57 10*3/uL — ABNORMAL HIGH (ref 3.10–9.50)

## 2021-04-29 LAB — CELL MORPHOLOGY
Cell Morphology: NORMAL
Platelet Estimate: INCREASED — AB

## 2021-04-29 LAB — GFR: EGFR: 27.4

## 2021-04-29 LAB — TROPONIN I: Troponin I: <0.01 ng/mL (ref 0.00–0.05)

## 2021-04-29 MED ORDER — SODIUM CHLORIDE 0.9 % IV BOLUS
500.0000 mL | Freq: Once | INTRAVENOUS | Status: AC
Start: 2021-04-29 — End: 2021-04-29
  Administered 2021-04-29: 500 mL via INTRAVENOUS

## 2021-04-29 MED ORDER — OXYCODONE-ACETAMINOPHEN 5-325 MG PO TABS
1.0000 | ORAL_TABLET | Freq: Once | ORAL | Status: AC
Start: 2021-04-29 — End: 2021-04-29
  Administered 2021-04-29: 1 via ORAL
  Filled 2021-04-29: qty 1

## 2021-04-29 MED ORDER — LIDOCAINE 5 % EX PTCH
1.0000 | MEDICATED_PATCH | CUTANEOUS | 0 refills | Status: DC
Start: 2021-04-29 — End: 2022-11-27

## 2021-04-29 NOTE — ED Triage Notes (Signed)
PT BIBA from home. EMS reports syncope episode while sitting in bed and had a syncope episode at her MD office x 2 days ago; denies hitting head and is on eliquis. Per EMS, BG 229 and BP 120/70. Per PT, she has been feeling dizzy and light headed standing up for the past week. Denies CP and SOB.    BP 117/80   Pulse 86   Temp 98.2 F (36.8 C) (Oral)   Ht 5\' 6"  (1.676 m)   Wt 122.5 kg   SpO2 100%   BMI 43.58 kg/m

## 2021-04-29 NOTE — ED Notes (Signed)
Bed: BL25  Expected date:   Expected time:   Means of arrival:   Comments:

## 2021-04-29 NOTE — ED Notes (Signed)
Pt states that per neurologist that there is "something wrong" with her right arm. States the nerves in palm going towards her antecubital are damaged. She states her grip is weaker in both hands more so in her right hand. States she is having a hard time with words. She states she has been associated with her diabetic neuropathy. She states she wanted to make sure ED staff was aware of this information. Advised this EMT would make a note so others are aware.

## 2021-04-29 NOTE — EDIE (Signed)
COLLECTIVE?NOTIFICATION?04/29/2021 14:09?[name]?MRN: [id number]    Criteria Met      5 ED Visits in 12 Months    Security and Safety  No Security Events were found.  ED Care Guidelines  There are currently no ED Care Guidelines for this patient. Please check your facility's medical records system.        Prescription Monitoring Program  110??- Narcotic Use Score  160??- Sedative Use Score  000??- Stimulant Use Score  310??- Overdose Risk Score  - All Scores range from 000-999 with 75% of the population scoring < 200 and on 1% scoring above 650  - The last digit of the narcotic, sedative, and stimulant score indicates the number of active prescriptions of that type  - Higher Use scores correlate with increased prescribers, pharmacies, mg equiv, and overlapping prescriptions  - Higher Overdose Risk Scores correlate with increased risk of unintentional overdose death   Concerning or unexpectedly high scores should prompt a review of the PMP record; this does not constitute checking PMP for prescribing purposes.    E.D. Visit Count (12 mo.)  Facility Visits   [facility] 2   [place] - [facility] 1   [place] - [facility] 6   [facility] 1   Total 10   Note: Visits indicate total known visits.     Recent Emergency Department Visit Summary  Date Facility [facility] Type Diagnoses or Chief Complaint    Apr 29, 2021  [place] [facility] [facility]  [place]  [place]  Emergency      Syncope      Mar 18, 2021  [place] - [facility] [facility]  [place]  Emergency      sob;asthma      Shortness of Breath      Cough      Disorder of kidney and ureter, unspecified      Unspecified asthma with (acute) exacerbation      Oct 21, 2020  [place] [facility]  [facility]  [place]  Emergency      Leg Pain      Leg Pain; Fever      Pain in left foot      Unspecified fracture of left foot, subsequent encounter for fracture with nonunion      Unspecified fracture of left foot, initial encounter  for closed fracture      Aug 20, 2020  [place] [facility] [place] [facility]  [place]  [place]  Emergency      anxiety      Acute combined systolic (congestive) and diastolic (congestive) heart failure      Aug 20, 2020  [place] [facility] [facility]  [place]  [place]  Emergency      difficulty breath; heart failure      difficulty breath; heart failure; asthma      Congestive Heart Failure      Edema, unspecified      Aug 10, 2020  [place] [facility] [facility]  [facility]  [place]  Emergency      sob; difficulty breathing; diabetic; chest pain      sob; difficulty breathing; diabetic; chest pain; emesis      Emesis      Chest Pain      Shortness of Breath      Acute kidney failure, unspecified      Aug 09, 2020  [place] [facility] [facility]  [place]  [place]  Emergency      nausea  Abdominal Cramping      Emesis      Jun 22, 2020  [facility] -[facility].  Staff.  [place]  Emergency      Mental Health      Mental Health Problem      Hyperglycemia, unspecified      Major depressive disorder, single episode, unspecified      Jun 14, 2020  [facility]  [facility].  [place]  Emergency      Dorsalgia, unspecified      Type 2 diabetes mellitus with hyperglycemia      Long term (current) use of insulin      May 29, 2020  [facility].  [place] Ax.  [place]  Emergency      Nondisplaced fracture of fifth metatarsal bone, left foot, initial encounter for closed fracture      Fall on same level from slipping, tripping and stumbling without subsequent striking against object, initial encounter        Recent Inpatient Visit Summary  Date Facility [facility] Type Diagnoses or Chief Complaint    Aug 10, 2020  [place] [facility] [place] [facility]  [place]  [place]  Medical Surgical      Adverse effect of unspecified drugs, medicaments and biological substances, initial encounter      Acute kidney failure, unspecified      Hyperglycemia, unspecified      Unspecified asthma with (acute) exacerbation        Care Team  Provider Specialty Phone Fax Service Dates   [facility]  / [facility] Clinic/Center: [facility]) [phone] Current    Ricard Dillon , MD Internal Medicine [phone] Current      Collective Portal  This patient has registered at the [facility] Emergency Department   For more information visit: [email or web address]     PLEASE NOTE:     1.   Any care recommendations and other clinical information are provided as guidelines or for historical purposes only, and providers should exercise their own clinical judgment when providing care.    2.   You may only use this information for purposes of treatment, payment or health care operations activities, and subject to the limitations of applicable Collective Policies.    3.   You should consult directly with the organization that provided a care guideline or other clinical history with any questions about additional information or accuracy or completeness of information provided.    ? 2022 [place], [employer] - [email or web address]

## 2021-04-29 NOTE — ED Provider Notes (Signed)
EMERGENCY DEPARTMENT HISTORY AND PHYSICAL EXAM     None        Date: 04/29/2021  Patient Name: [name]    History of Presenting Illness     Chief Complaint   Patient presents with   . Syncope       History Provided By: patient    History: [name] is a 40 y.o. female presenting to the ED with moderate to moderate severity, intermittent, generalized lightheadedness for the past 1 week.  Patient had syncopal episode at her doctor's office 2 days ago.  She also felt lightheaded and felt she passed out in bed earlier today.  No chest pain or shortness of breath.  No leg pain or leg swelling.  No headache or neck pain.  No abdominal pain, nausea, vomiting, diarrhea, constipation.  No clear exacerbating relieving factors to symptoms.  History of pulmonary embolism on Eliquis.  No missed doses.  Also history of hypertension, CKD, diabetes on insulin. CHF on bumex 2mg  bid.       PCP: Octavia Bruckner, MD  SPECIALISTS:    No current facility-administered medications for this encounter.     Current Outpatient Medications   Medication Sig Dispense Refill   . atorvastatin (LIPITOR) 20 MG tablet 1 tablet     . albuterol sulfate HFA (PROVENTIL) 108 (90 Base) MCG/ACT inhaler Inhale 2 puffs into the lungs every 4 (four) hours as needed for Wheezing 1 each 0   . Alcohol Swabs Pads Use as indicated for insulin injections 120 each 3   . apixaban (ELIQUIS) 5 MG Take 5 mg by mouth every 12 (twelve) hours     . aspirin 81 MG chewable tablet Chew 81 mg by mouth daily     . atorvastatin (LIPITOR) 20 MG tablet Take 20 mg by mouth nightly     . baclofen (LIORESAL) 10 MG tablet Take 10 mg by mouth as needed     . bumetanide (BUMEX) 2 MG tablet Take 1 tablet (2 mg total) by mouth 2 (two) times daily 60 tablet 0   . busPIRone HCl (BUSPAR PO) Take 25 mg by mouth daily     . carvedilol (COREG) 25 MG tablet Take 25 mg by mouth 2 (two) times daily     . folic acid (FOLVITE) 1 MG tablet Take 1 mg by mouth daily     . gabapentin (NEURONTIN)  600 MG tablet Take 600 mg by mouth 3 (three) times daily     . hydrALAZINE (APRESOLINE) 50 MG tablet Take 50 mg by mouth 2 (two) times daily     . hydrOXYzine (ATARAX) 10 MG tablet Take 10 mg by mouth every 6 (six) hours as needed for Anxiety     . insulin detemir (Levemir) 100 UNIT/ML injection Inject 50 Units into the skin every 12 (twelve) hours 30 mL 1   . insulin lispro (HumaLOG) 100 UNIT/ML injection Inject 25 Units into the skin 3 (three) times daily before meals 30 mL 3   . Insulin Syringe-Needle U-100 27G X 1/2" 1 ML Misc Inject 1 Dose into the skin 4 (four) times daily 500 each 3   . lidocaine (LIDODERM) 5 % Place 1 patch onto the skin every 24 hours Remove & Discard patch within 12 hours or as directed by MD 15 patch 0   . liraglutide (VICTOZA) 18 MG/3ML injection Inject 0.6 mg into the skin daily 3 mL 2   . meclizine (ANTIVERT) 25 MG tablet Take 25 mg by  mouth as needed     . montelukast (SINGULAIR) 10 MG tablet Take 10 mg by mouth nightly     . NON FORMULARY daily BRIO     . olmesartan (BENICAR) 40 MG tablet Take 40 mg by mouth daily     . omeprazole (PriLOSEC) 40 MG capsule Take 40 mg by mouth nightly     . topiramate (TOPAMAX) 100 MG tablet Take 100 mg by mouth daily     . valACYclovir (VALTREX) 1000 MG tablet Take 1 g by mouth as needed     . Vilazodone HCl 40 MG Tab Take by mouth         Past History     Past Medical History:  Past Medical History:   Diagnosis Date   . Arthritis    . Asthma    . Chronic kidney disease    . Congestive heart failure    . Diabetes mellitus    . Fibromyalgia    . Hyperlipidemia    . Hypertension    . Neuropathy    . Pulmonary embolism 08/2019   . Vertigo        Past Surgical History:  Past Surgical History:   Procedure Laterality Date   . HYSTERECTOMY  12/2019       Family History:  Family History   Problem Relation Age of Onset   . Heart disease Mother    . Hypertension Mother    . Diabetes Mother    . Asthma Father    . Hypertension Father    . Diabetes Daughter    .  Asthma Son    . Cancer Maternal Aunt    . Cancer Maternal Grandmother        Social History:  Social History     Tobacco Use   . Smoking status: Never   . Smokeless tobacco: Never   Vaping Use   . Vaping Use: Never used   Substance Use Topics   . Alcohol use: Never   . Drug use: Never       Allergies:  Allergies   Allergen Reactions   . Bactrim [Sulfamethoxazole-Trimethoprim] Hives, Respiratory Distress and Edema   . Glipizide Edema     Throat swelling   . Lisinopril Edema     Tongue swelling       Review of Systems     Review of Systems: All other systems reviewed and negative.         Physical Exam   BP 118/77   Pulse 85   Temp 98.2 F (36.8 C) (Oral)   Resp 19   Ht 5\' 6"  (1.676 m)   Wt 122.5 kg   SpO2 99%   BMI 43.58 kg/m     Constitutional: Vital signs reviewed. Well appearing.  Well nourished.  No distress.  Head: Normocephalic, atraumatic  Eyes: Conjunctiva and sclera are normal.  No injection or discharge.  Ears, Nose, Throat:  Normal external examination of the nose and ears. No throat or oropharyngeal swelling or erythema. Midline uvula. Mucous membranes moist.  Neck: Normal range of motion. Supple, no meningeal signs. Trachea midline. No stridor. No JVD  Respiratory/Chest: Clear to auscultation. No respiratory distress.   Cardiovascular: Regular rate and rhythm. No murmurs.  Abdomen:  Bowel sounds intact. No rebound or guarding. Soft.  Non-tender.  Back: No CVA tenderness to percussion. No focal tenderness.  Upper Extremity:  No edema. No cyanosis. Bilateral radial pulses intact and equal.   Lower Extremity:  No  edema. No cyanosis. Bilateral calves symmetrical and non-tender. Bilateral femoral, DP, PT pulses intact and equal.  Skin: Warm and dry. No rash.  Neuro: Cranial nerves grossly intact.  Moves all extremities spontaneously.  Psychiatric: Normal affect.  Normal insight.      Diagnostic Study Results     Labs -     Results       Procedure Component Value Units Date/Time    COVID-19  (SARS-COV-2) [facility] Rapid) [[id number]] Collected: 04/29/21 1849    Specimen: Nasopharyngeal Swab from Nasopharynx Updated: 04/29/21 1923     Purpose of COVID testing Screening     SARS-CoV-2 Specimen Source Nasopharyngeal     SARS CoV-2 Negative    Narrative:      o Collect and clearly label specimen type:  o Upper respiratory specimen: One Nasopharyngeal Dry Swab NO  Transport Media.  o Hand deliver to laboratory ASAP  Indication for testing->Extended care facility admission to  semi private room  Screening    Cell MorpHology [[id number]]  (Abnormal) Collected: 04/29/21 1704     Updated: 04/29/21 1807     Cell Morphology Normal     Platelet Estimate Increased     Platelet Clumps Present    Manual Differential [[id number]]  (Abnormal) Collected: 04/29/21 1704     Updated: 04/29/21 1807     Segmented Neutrophils 54 %      Band Neutrophils 0 %      Lymphocytes Manual 37 %      Monocytes Manual 5 %      Eosinophils Manual 3 %      Basophils Manual 1 %      Neutrophils Absolute Manual 5.71 x10 3/uL      Band Neutrophils Absolute 0.00 x10 3/uL      Lymphocytes Absolute Manual 3.91 x10 3/uL      Monocytes Absolute 0.53 x10 3/uL      Eosinophils Absolute Manual 0.32 x10 3/uL      Basophils Absolute Manual 0.11 x10 3/uL     CBC and differential [[id number]]  (Abnormal) Collected: 04/29/21 1704    Specimen: Blood Updated: 04/29/21 1807     WBC 10.57 x10 3/uL      Hgb 9.2 g/dL      Hematocrit 76.1 %      Platelets 455 x10 3/uL      RBC 3.54 x10 6/uL      MCV 84.7 fL      MCH 26.0 pg      MCHC 30.7 g/dL      RDW 12 %      MPV 9.9 fL      Nucleated RBC 0.0 /100 WBC      Absolute NRBC 0.00 x10 3/uL     Comprehensive metabolic panel [[id number]]  (Abnormal) Collected: 04/29/21 1704    Specimen: Blood Updated: 04/29/21 1750     Glucose 217 mg/dL      BUN 69.4 mg/dL      Creatinine 2.0 mg/dL      Sodium 854 mEq/L      Potassium 4.8 mEq/L      Chloride 105 mEq/L      CO2 23 mEq/L      Calcium 9.3 mg/dL      Protein, Total 7.3 g/dL       Albumin 3.5 g/dL      AST (SGOT) 11 U/L      ALT 11 U/L      Alkaline Phosphatase 97 U/L      Bilirubin,  Total 0.3 mg/dL      Globulin 3.8 g/dL      Albumin/Globulin Ratio 0.9     Anion Gap 10.0    GFR [[id number]] Collected: 04/29/21 1704     Updated: 04/29/21 1750     EGFR 27.4       Troponin I [[id number]] Collected: 04/29/21 1704    Specimen: Blood Updated: 04/29/21 1738     Troponin I <0.01 ng/mL             Radiologic Studies -   Radiology Results (24 Hour)       Procedure Component Value Units Date/Time    Chest AP Portable [[id number]] Collected: 04/29/21 1505    Order Status: Completed Updated: 04/29/21 1507    Narrative:      XR CHEST AP PORTABLE    CLINICAL INDICATION:   Chest Pain    COMPARISON: 03/18/2021    FINDINGS: The cardiomediastinal silhouette appears within normal size  limits for portable technique and patient positioning. There is no  evidence for focal airspace consolidation, pleural effusion, or  pneumothorax. The pulmonary vascularity appears unremarkable.      Impression:       No acute pulmonary or pleural disease.    Sandie Ano, MD   04/29/2021 3:05 PM        .    Medical Decision Making   I am the first provider for this patient.    I reviewed the vital signs, available nursing notes, past medical history, past surgical history, family history and social history.    Vital Signs-Reviewed the patient's vital signs.   No data found.        EKG:  Interpreted by the EP.   Time Interpreted: 330   Rate: 81   Rhythm: NSR    Interpretation: no STEMI; LVH    Comparison: 03/18/21-- no sig change      ED Course:     600 -patient feeling much better.  Ambulating without any difficulty.  Neuro exam is nonfocal.  Reviewed all results and impressions with patient.  She would like to go home if possible.  She agrees with plan to discuss with her cardiologist prior to making decision about disposition.    655 - d/aw Dr. Malen Gauze, [facility] cardiology on call, should be safe for discharge home after IV hydration.  Hold bumex tomorrow. F/u in clinic with her cardiologist      706 -patient feeling much better.  Denies any lightheadedness.  Neurological exam remains nonfocal.  Ambulating without difficulty.  She would like to go home. Discussed results with pt and counseled on diagnosis, f/u plans, medication use, supportive care, and signs and symptoms when to return to ED immediately. Pt voices understanding and agreement with plan. All questions and concerns addressed.       Provider Notes: Patient presenting to the ED with lightheadedness and syncopal episode x2 over the past week.  Compliant with blood thinners.  Elevated BUN/creatinine ratio on lab work.  She felt much better after IV hydration.  Discussed with cardiologist on-call for her cardiologist as above.  Low clinical suspicion for acute PE given she is already anticoagulated and compliant with medications.  Doubt ACS given duration of symptoms with negative troponin.  Doubt ruptured AAA given abdominal tenderness and hypertension.  Doubt subarachnoid hemorrhage given no headache and normal neurological exam.  Suspect she may be over diuresed given elevated BUN/creatinine ratio.  She was gently hydrated in the ED and felt much better afterwards.  She is ambulating without any difficulty.  Will hold Bumex for the next 5 days and she will follow-up with primary care doctor.  Reviewed supportive care and immediate return precautions.  Patient voiced understanding agreement with plan          Diagnosis     Clinical Impression:   1. Syncope and collapse    2. Lightheadedness    3. Abnormal BUN-to-creatinine ratio        Treatment Plan:   ED Disposition       ED Disposition   Discharge    Condition   --    Date/Time   Mon Apr 29, 2021  7:06 PM    Comment   Mardel Grudzien discharge to home/self care.    Condition at disposition: Stable                   _______________________________    This note was generated by the Epic EMR system/ Dragon speech recognition and may  contain inherent errors or omissions not intended by the user. Grammatical errors, random word insertions, deletions and pronoun errors  are occasional consequences of this technology due to software limitations. Not all errors are caught or corrected. If there are questions or concerns about the content of this note or information contained within the body of this dictation they should be addressed directly with the author for clarification.      Attestations: This note is prepared by Lynnea Ferrier, MD    _______________________________         Maryella Shivers, MD  05/02/21 [phone]

## 2021-04-29 NOTE — Discharge Instructions (Signed)
Hold your doses of Bumex tomorrow.  Follow-up with cardiologist soon as possible.

## 2021-04-30 LAB — ECG 12-LEAD
Atrial Rate: 81 {beats}/min
IHS MUSE NARRATIVE AND IMPRESSION: NORMAL
P Axis: 46 degrees
P-R Interval: 170 ms
Q-T Interval: 398 ms
QRS Duration: 86 ms
QTC Calculation (Bezet): 462 ms
R Axis: -15 degrees
T Axis: 66 degrees
Ventricular Rate: 81 {beats}/min

## 2021-06-21 ENCOUNTER — Encounter (INDEPENDENT_AMBULATORY_CARE_PROVIDER_SITE_OTHER): Payer: Self-pay

## 2021-06-29 ENCOUNTER — Emergency Department: Payer: BC Managed Care – PPO

## 2021-06-29 ENCOUNTER — Emergency Department
Admission: EM | Admit: 2021-06-29 | Discharge: 2021-06-29 | Disposition: A | Discharge status: Home / Self Care | Payer: BC Managed Care – PPO | Attending: Internal Medicine | Admitting: Internal Medicine

## 2021-06-29 DIAGNOSIS — Z23 Encounter for immunization: Secondary | ICD-10-CM | POA: Insufficient documentation

## 2021-06-29 DIAGNOSIS — U071 COVID-19: Secondary | ICD-10-CM | POA: Insufficient documentation

## 2021-06-29 DIAGNOSIS — R531 Weakness: Secondary | ICD-10-CM

## 2021-06-29 DIAGNOSIS — D649 Anemia, unspecified: Secondary | ICD-10-CM

## 2021-06-29 DIAGNOSIS — N289 Disorder of kidney and ureter, unspecified: Secondary | ICD-10-CM | POA: Insufficient documentation

## 2021-06-29 LAB — CBC AND DIFFERENTIAL
Absolute NRBC: 0 10*3/uL (ref 0.00–0.00)
Basophils Absolute Automated: 0.04 10*3/uL (ref 0.00–0.08)
Basophils Automated: 0.4 %
Eosinophils Absolute Automated: 0.18 10*3/uL (ref 0.00–0.44)
Eosinophils Automated: 1.6 %
Hematocrit: 30.1 % — ABNORMAL LOW (ref 34.7–43.7)
Hgb: 9.3 g/dL — ABNORMAL LOW (ref 11.4–14.8)
Immature Granulocytes Absolute: 0.28 10*3/uL — ABNORMAL HIGH (ref 0.00–0.07)
Immature Granulocytes: 2.5 %
Lymphocytes Absolute Automated: 1.3 10*3/uL (ref 0.42–3.22)
Lymphocytes Automated: 11.6 %
MCH: 26.6 pg (ref 25.1–33.5)
MCHC: 30.9 g/dL — ABNORMAL LOW (ref 31.5–35.8)
MCV: 86 fL (ref 78.0–96.0)
MPV: 10 fL (ref 8.9–12.5)
Monocytes Absolute Automated: 0.97 10*3/uL — ABNORMAL HIGH (ref 0.21–0.85)
Monocytes: 8.7 %
Neutrophils Absolute: 8.42 10*3/uL — ABNORMAL HIGH (ref 1.10–6.33)
Neutrophils: 75.2 %
Nucleated RBC: 0 /100 WBC (ref 0.0–0.0)
Platelets: 347 10*3/uL — ABNORMAL HIGH (ref 142–346)
RBC: 3.5 10*6/uL — ABNORMAL LOW (ref 3.90–5.10)
RDW: 13 % (ref 11–15)
WBC: 11.19 10*3/uL — ABNORMAL HIGH (ref 3.10–9.50)

## 2021-06-29 LAB — COMPREHENSIVE METABOLIC PANEL
ALT: 13 U/L (ref 0–55)
AST (SGOT): 15 U/L (ref 5–34)
Albumin/Globulin Ratio: 0.8 — ABNORMAL LOW (ref 0.9–2.2)
Albumin: 3.3 g/dL — ABNORMAL LOW (ref 3.5–5.0)
Alkaline Phosphatase: 86 U/L (ref 37–117)
Anion Gap: 10 (ref 5.0–15.0)
BUN: 51 mg/dL — ABNORMAL HIGH (ref 7.0–19.0)
Bilirubin, Total: 0.2 mg/dL (ref 0.2–1.2)
CO2: 22 mEq/L (ref 22–29)
Calcium: 9 mg/dL (ref 8.5–10.5)
Chloride: 108 mEq/L (ref 100–111)
Creatinine: 1.5 mg/dL — ABNORMAL HIGH (ref 0.6–1.0)
Globulin: 3.9 g/dL — ABNORMAL HIGH (ref 2.0–3.6)
Glucose: 56 mg/dL — ABNORMAL LOW (ref 70–100)
Potassium: 4.9 mEq/L (ref 3.5–5.1)
Protein, Total: 7.2 g/dL (ref 6.0–8.3)
Sodium: 140 mEq/L (ref 136–145)

## 2021-06-29 LAB — GLUCOSE WHOLE BLOOD - POCT
Whole Blood Glucose POCT: 67 mg/dL — ABNORMAL LOW (ref 70–100)
Whole Blood Glucose POCT: 91 mg/dL (ref 70–100)

## 2021-06-29 LAB — COVID-19 (SARS-COV-2) & INFLUENZA  A/B, NAA (ROCHE LIAT)
Influenza A: NOT DETECTED
Influenza B: NOT DETECTED
SARS CoV 2 Overall Result: DETECTED — AB

## 2021-06-29 LAB — GFR: EGFR: 46.1

## 2021-06-29 LAB — LACTIC ACID, PLASMA: Lactic Acid: 0.9 mmol/L (ref 0.2–2.0)

## 2021-06-29 MED ORDER — ACETAMINOPHEN 325 MG PO TABS
650.0000 mg | ORAL_TABLET | Freq: Once | ORAL | Status: DC | PRN
Start: 2021-06-29 — End: 2021-06-30

## 2021-06-29 MED ORDER — SODIUM CHLORIDE 0.9 % IV BOLUS
500.0000 mL | Freq: Once | INTRAVENOUS | Status: DC | PRN
Start: 2021-06-29 — End: 2021-06-30

## 2021-06-29 MED ORDER — HYDROCORTISONE SOD SUC (PF) 100 MG IJ SOLR (WRAP)
100.0000 mg | Freq: Once | INTRAMUSCULAR | Status: DC | PRN
Start: 2021-06-29 — End: 2021-06-30

## 2021-06-29 MED ORDER — ONDANSETRON HCL 4 MG/2ML IJ SOLN
4.0000 mg | Freq: Once | INTRAMUSCULAR | Status: AC
Start: 2021-06-29 — End: 2021-06-29
  Administered 2021-06-29: 21:00:00 4 mg via INTRAVENOUS
  Filled 2021-06-29: qty 2

## 2021-06-29 MED ORDER — ACETAMINOPHEN 500 MG PO TABS
1000.0000 mg | ORAL_TABLET | Freq: Once | ORAL | Status: AC
Start: 2021-06-29 — End: 2021-06-29
  Administered 2021-06-29: 19:00:00 1000 mg via ORAL
  Filled 2021-06-29: qty 2

## 2021-06-29 MED ORDER — FAMOTIDINE 10 MG/ML IV SOLN (WRAP)
20.0000 mg | Freq: Once | INTRAVENOUS | Status: DC | PRN
Start: 2021-06-29 — End: 2021-06-30

## 2021-06-29 MED ORDER — DIPHENHYDRAMINE HCL 50 MG/ML IJ SOLN
50.0000 mg | Freq: Once | INTRAMUSCULAR | Status: DC | PRN
Start: 2021-06-29 — End: 2021-06-30

## 2021-06-29 MED ORDER — SODIUM CHLORIDE 0.9 % IV SOLN
100.0000 mL/h | INTRAVENOUS | Status: DC
Start: 2021-06-29 — End: 2021-06-30
  Administered 2021-06-29: 19:00:00 100 mL/h via INTRAVENOUS

## 2021-06-29 MED ORDER — EPINEPHRINE HCL 1 MG/ML ADULT ANAPHYLAXIS KIT
0.3000 mg | Freq: Once | INTRAMUSCULAR | Status: DC | PRN
Start: 2021-06-29 — End: 2021-06-30

## 2021-06-29 MED ORDER — ONDANSETRON HCL 4 MG/2ML IJ SOLN
4.0000 mg | Freq: Once | INTRAMUSCULAR | Status: DC | PRN
Start: 2021-06-29 — End: 2021-06-30

## 2021-06-29 MED ORDER — SODIUM CHLORIDE 0.9 % IV SOLN
25.0000 mL/h | INTRAVENOUS | Status: DC | PRN
Start: 2021-06-29 — End: 2021-06-30

## 2021-06-29 MED ORDER — DIPHENHYDRAMINE HCL 50 MG/ML IJ SOLN
25.0000 mg | Freq: Once | INTRAMUSCULAR | Status: DC | PRN
Start: 2021-06-29 — End: 2021-06-30

## 2021-06-29 MED ORDER — BEBTELOVIMAB 175 MG/2ML IV SOLN (EUA)
175.0000 mg | Freq: Once | INTRAVENOUS | Status: AC
Start: 2021-06-29 — End: 2021-06-29
  Administered 2021-06-29: 21:00:00 175 mg via INTRAVENOUS
  Filled 2021-06-29: qty 2

## 2021-06-29 NOTE — ED Triage Notes (Signed)
[name] is a 40 y.o. female BIBA from home Generalized weakness, Diarrhea, Nausea and Vomiting since 2 days. EMS states pt is A&OX4, hot to touch, c/o dizziness and Headache. EMS:  HR-110, BP-200/90, 100% on RA, RR-20

## 2021-06-29 NOTE — ED Notes (Signed)
Bed: GR15  Expected date:   Expected time:   Means of arrival:   Comments:  Medic 208

## 2021-06-29 NOTE — Discharge Instructions (Signed)
Know how it spreads    The best way to prevent illness is to avoid being exposed to this virus.    The virus is thought to spread from person-to-person.    Wash your hands often  Wash your hands often with soap and water for at least 20 seconds especially after you have been in a public place, or after blowing your nose, coughing, or sneezing.  If soap and water are not available, use a hand sanitizer that contains at least 60% alcohol.   Avoid touching your eyes, nose, and mouth with unwashed hands.    Physical distancing and masks  Put distance between yourself and other people outside of your home.  Stay at least 6 feet (about 2 arms' length) from other people.  Cover your face and mouth with a mask or cloth face cover when around others or out in public.  Masks or face coverings should not be placed on young children under age 2, anyone who has trouble breathing, or is unconscious, incapacitated or otherwise unable to remove the mask without assistance.  The mask or face cover will both protect you from infection and protect other people in case you are infected.  Continue to maintain 6 feet distance from others when possible. A mask or face cover is not a substitute for social distancing.      Clean and disinfect  Clean AND disinfect frequently touched surfaces regularly. This includes tables, doorknobs, light switches, countertops, handles, desks, phones, keyboards, toilets, faucets, and sinks.     What to do if you become sick  If you are sick with COVID-19 or think you might have COVID-19, follow these steps to care for yourself and to help protect other people in your home and your community:  Look for emergency warning signs* of COVID-19:   A high fever not controlled by acetaminophen (Tylenol)  Worsening shortness of breath that limits your usual activities  Worsening chest pain, pressure, or tightness  Blue, white, or gray colored lips  Cold arms, legs, hands, or feet with blue, white, or gray  skin  Severe weakness and fatigue  New confusion or sleepiness  Sudden numbness, weakness, or loss of balance  Difficulty speaking, vision problems, or severe headache  Unable to maintain SpO2 >92% on a pulse oximeter  This list is not all inclusive; please consult your medical provider for any other severe symptoms that are concerning to you.  Call 911 or call ahead to your local emergency facility: Notify the operator that you are seeking care for someone who has or may have COVID-19.  Stay home.  Do not leave your home, except to get medical care. If you have a medical appointment, call ahead to your doctor's office, and tell them you have or may have COVID-19. This will help the office protect themselves and other patients. Avoid public transportation, ridesharing, or taxis.    As much as possible, stay in a specific room and away from other people and pets in your home. If possible, you should use a separate bathroom.   If a caregiver/other person needs to clean and disinfect a sick person's bedroom or bathroom, they should wear a mask and disposable gloves prior to cleaning. They should wait as long as possible after the person who is sick has used the bathroom before coming in to clean and use the bathroom. Hands should be washed after gloves are removed.  Do not share dishes, drinking glasses, cups, eating utensils, towels, or bedding   with other people in your home.  Wash items thoroughly after using them with soap and water or put in the dishwasher.     https://www.cdc.gov/coronavirus/2019-ncov/prevent-getting-sick/prevention.html  https://www.cdc.gov/coronavirus/2019-ncov/if-you-are-sick/steps-when-sick.html     Test Results and Questions  You can receive your COVID-19 test result online through MyChart, our secure patient portal.  If you have been tested for COVID-19 and the result is still pending, please be patient, since COVID-19 test results may take several days to be processed.  If you do not  already have an account, a MyChart activation code should have been provided during your visit.   You should visit [email or web address] and follow the instructions to create an account, even if you do not have an activation code.  If you're having difficulty finding your COVID-19 test results through MyChart, or if you have additional questions, please contact your primary care provider or the medical team indicated on your After Visit Summary.     If you had a positive COVID-19 test here is a link to take you to a fact sheet.  [email or web address]

## 2021-06-29 NOTE — EDIE (Signed)
COLLECTIVE?NOTIFICATION?06/29/2021 17:05?ASJIA, BERRIOS T?MRN: [id number]    Criteria Met      5 ED Visits in 12 Months    Security and Safety  No Security Events were found.  ED Care Guidelines  There are currently no ED Care Guidelines for this patient. Please check your facility's medical records system.        Prescription Monitoring Program  040??- Narcotic Use Score  020??- Sedative Use Score  000??- Stimulant Use Score  190??- Overdose Risk Score  - All Scores range from 000-999 with 75% of the population scoring < 200 and on 1% scoring above 650  - The last digit of the narcotic, sedative, and stimulant score indicates the number of active prescriptions of that type  - Higher Use scores correlate with increased prescribers, pharmacies, mg equiv, and overlapping prescriptions  - Higher Overdose Risk Scores correlate with increased risk of unintentional overdose death   Concerning or unexpectedly high scores should prompt a review of the PMP record; this does not constitute checking PMP for prescribing purposes.    E.D. Visit Count (12 mo.)  Facility Visits   [facility] 1   [place] [facility] 1   [place] [facility] 7   Total 9   Note: Visits indicate total known visits.     Recent Emergency Department Visit Summary  Date Facility [facility] Type Diagnoses or Chief Complaint    Jun 29, 2021  [place] [facility] [place] [facility]  [place]  [place]  Emergency      body aches, vomiting, diarrhea      Apr 29, 2021  [place] [facility] [place] [facility]  [place]  [place]  Emergency      Syncope      Syncope and collapse      Dizziness and giddiness      Abnormal finding of blood chemistry, unspecified      Other specified abnormal findings of blood chemistry      Apr 24, 2021  [facility] [place]  [place]  Emergency      1. Syncope and collapse      2. 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  Heart failure, unspecified      4. Type 1 diabetes mellitus with diabetic chronic kidney disease      5. Long term (current) use of insulin      6. Long term (current) use of anticoagulants      7. Contact with and (suspected) exposure to COVID-19      8. Personal history of pulmonary embolism      9. Other specified postprocedural states      10. Allergy status to other antibiotic agents      Mar 18, 2021  [place] [facility] [place] [facility]  [place]  [place]  Emergency      sob;asthma      Shortness of Breath      Cough      Disorder of kidney and ureter, unspecified      Unspecified asthma with (acute) exacerbation      Oct 21, 2020  [place] [facility]  [place]  [place]  Emergency      Leg Pain      Leg Pain; Fever      Pain in left foot      Unspecified fracture of left foot, subsequent encounter  for fracture with nonunion      Unspecified fracture of left foot, initial encounter for closed fracture      Aug 20, 2020  [place] [facility] [place] [facility]  [place]  [place]  Emergency      anxiety      Acute combined systolic (congestive) and diastolic (congestive) heart failure      Aug 20, 2020  [place] [facility] [place] [facility]  [place]  [place]  Emergency      difficulty breath; heart failure      difficulty breath; heart failure; asthma      Congestive Heart Failure      Edema, unspecified      Aug 10, 2020  [place] [facility] [place] [facility]  [place]  [place]  Emergency      sob; difficulty breathing; diabetic; chest pain      sob; difficulty breathing; diabetic; chest pain; emesis      Emesis      Chest Pain      Shortness of Breath      Acute kidney failure, unspecified      Aug 09, 2020  [place] [facility] [place] [facility]  [place]  [place]  Emergency      nausea      Abdominal Cramping      Emesis        Recent Inpatient Visit Summary  Date Facility [facility] Type Diagnoses or Chief Complaint    Aug 10, 2020  [place] [facility] [place] [facility]  [place]  [place]  Medical Surgical      Adverse effect of unspecified drugs, medicaments and biological substances, initial encounter      Acute kidney failure, unspecified       Hyperglycemia, unspecified      Unspecified asthma with (acute) exacerbation        Care Team  Provider Specialty Phone Fax Service Dates   [facility] Clinic/Center: [facility]) [phone] Current    Ricard Dillon , MD Internal Medicine [phone] Current      Collective Portal  This patient has registered at the [facility] Emergency Department   For more information visit: [email or web address]     PLEASE NOTE:     1.   Any care recommendations and other clinical information are provided as guidelines or for historical purposes only, and providers should exercise their own clinical judgment when providing care.    2.   You may only use this information for purposes of treatment, payment or health care operations activities, and subject to the limitations of applicable Collect[employer] Policies.    3.   You should consult directly with the organization that provided a care guideline or other clinical history with any questions about additional information or accuracy or completeness of information provided.    ? 2022 [place], [employer] - [email or web address]

## 2021-06-29 NOTE — ED Provider Notes (Signed)
EMERGENCY DEPARTMENT HISTORY AND PHYSICAL EXAM        Date: 06/29/2021  Patient Name: [name]    History of Presenting Illness         History Provided By: pt    Chief Complaint: fever  Onset: couple days ago  Timing: acute  Location: systemic   Quality: elevated  Severity: moderate  Modifying Factors: alka seltzer cold and flu  Associated Symptoms: body aches, headache, sore throat, vomiting and diarrhea     Additional History: [name] is a 40 y.o. female with a h/o diabetes, renal insufficiency, htn presents with fever, headache, sore throat and myalgias for the past couple days. She has been vaccinated for covid.  No cough or dysuria.     PCP: Octavia Bruckner, MD      No current facility-administered medications for this encounter.     No current outpatient medications on file.       Past History     Past Medical History:  History reviewed. No pertinent past medical history.    Past Surgical History:  History reviewed. No pertinent surgical history.    Family History:  History reviewed. No pertinent family history.    Social History:  Social History     Tobacco Use   . Smoking status: Never   . Smokeless tobacco: Never   Vaping Use   . Vaping Use: Never used   Substance Use Topics   . Alcohol use: Never   . Drug use: Never       Allergies:  Allergies   Allergen Reactions   . Bactrim [Sulfamethoxazole-Trimethoprim]        Review of Systems   Review of Systems   Constitutional:  Positive for fever and malaise/fatigue.   HENT:  Positive for sore throat.    Eyes:  Negative for photophobia.   Respiratory:  Positive for cough.    Cardiovascular:  Negative for chest pain.   Gastrointestinal:  Positive for diarrhea and vomiting.   Genitourinary:  Negative for dysuria.   Musculoskeletal:  Positive for myalgias. Negative for neck pain.   Skin:  Negative for itching.   Neurological:  Positive for headaches. Negative for speech change and focal weakness.   Endo/Heme/Allergies:  Does not bruise/bleed  easily.   Psychiatric/Behavioral:  Negative for memory loss.       Physical Exam   BP 146/67   Pulse 97   Temp (!) 100.7 F (38.2 C) (Oral)   Resp 17   Ht 5\' 6"  (1.676 m)   Wt 122.5 kg   SpO2 97%   BMI 43.58 kg/m   Physical Exam  Vitals reviewed.   Constitutional:       General: She is in acute distress.      Appearance: She is ill-appearing.   HENT:      Head: Normocephalic and atraumatic.      Nose: No congestion.   Eyes:      Conjunctiva/sclera: Conjunctivae normal.   Cardiovascular:      Rate and Rhythm: Regular rhythm. Tachycardia present.   Pulmonary:      Effort: Pulmonary effort is normal.      Breath sounds: Normal breath sounds.   Abdominal:      General: Bowel sounds are normal.      Palpations: Abdomen is soft.      Tenderness: There is no abdominal tenderness.   Musculoskeletal:      Cervical back: Normal range of motion and neck supple.  Right lower leg: No edema.      Left lower leg: No edema.   Skin:     General: Skin is warm and dry.   Neurological:      Mental Status: She is alert and oriented to person, place, and time.   Psychiatric:         Mood and Affect: Mood normal.         Behavior: Behavior normal.         Thought Content: Thought content normal.         Judgment: Judgment normal.       Diagnostic Study Results     Labs -     Results       Procedure Component Value Units Date/Time    Glucose Whole Blood - POCT [[id number]] Collected: 06/29/21 2007     Updated: 06/29/21 2009     Whole Blood Glucose POCT 91 mg/dL     Glucose Whole Blood - POCT [[id number]]  (Abnormal) Collected: 06/29/21 1840     Updated: 06/29/21 1842     Whole Blood Glucose POCT 67 mg/dL     Comprehensive metabolic panel [[id number]]  (Abnormal) Collected: 06/29/21 1754    Specimen: Blood Updated: 06/29/21 1817     Glucose 56 mg/dL      BUN 08.6 mg/dL      Creatinine 1.5 mg/dL      Sodium 578 mEq/L      Potassium 4.9 mEq/L      Chloride 108 mEq/L      CO2 22 mEq/L      Calcium 9.0 mg/dL      Protein, Total 7.2  g/dL      Albumin 3.3 g/dL      AST (SGOT) 15 U/L      ALT 13 U/L      Alkaline Phosphatase 86 U/L      Bilirubin, Total 0.2 mg/dL      Globulin 3.9 g/dL      Albumin/Globulin Ratio 0.8     Anion Gap 10.0    GFR [[id number]] Collected: 06/29/21 1754     Updated: 06/29/21 1817     EGFR 46.1    COVID-19 (SARS-CoV-2) and Influenza A/B, NAA ([place] Rapid)- Age 38 and above [[id number]]  (Abnormal) Collected: 06/29/21 1739    Specimen: Culturette from Nasopharyngeal Updated: 06/29/21 1814     Purpose of COVID testing Diagnostic -[facility]I     SARS-CoV-2 Specimen Source Nasal Swab     SARS CoV 2 Overall Result Detected     Influenza A Not Detected     Influenza B Not Detected    Narrative:      o Collect and clearly label specimen type:  o PREFERRED-Upper respiratory specimen: One Nasal Swab in  Transport Media.  o Hand deliver to laboratory ASAP  Diagnostic -[facility]    Lactic Acid [[id number]] Collected: 06/29/21 1754    Specimen: Blood Updated: 06/29/21 1804     Lactic Acid 0.9 mmol/L     CBC and differential [[id number]]  (Abnormal) Collected: 06/29/21 1754    Specimen: Blood Updated: 06/29/21 1802     WBC 11.19 x10 3/uL      Hgb 9.3 g/dL      Hematocrit 63.8 %      Platelets 347 x10 3/uL      RBC 3.50 x10 6/uL      MCV 86.0 fL      MCH 26.6 pg      MCHC 30.9 g/dL  RDW 13 %      MPV 10.0 fL      Neutrophils 75.2 %      Lymphocytes Automated 11.6 %      Monocytes 8.7 %      Eosinophils Automated 1.6 %      Basophils Automated 0.4 %      Immature Granulocytes 2.5 %      Nucleated RBC 0.0 /100 WBC      Neutrophils Absolute 8.42 x10 3/uL      Lymphocytes Absolute Automated 1.30 x10 3/uL      Monocytes Absolute Automated 0.97 x10 3/uL      Eosinophils Absolute Automated 0.18 x10 3/uL      Basophils Absolute Automated 0.04 x10 3/uL      Immature Granulocytes Absolute 0.28 x10 3/uL      Absolute NRBC 0.00 x10 3/uL             Radiologic Studies -   Radiology Results (24 Hour)       Procedure Component Value Units Date/Time    Chest AP  Portable [[id number]] Collected: 06/29/21 1835    Order Status: Completed Updated: 06/29/21 1837    Narrative:      CLINICAL INDICATION: Sepsis, eval for pneumonia     COMPARISON: None available    INTERPRETATION:   A single frontal view of the chest was obtained. .     The cardiomediastinal contour  is within normal limits for age. The  lungs are clear and the sulci are sharp.      Impression:        No acute cardiopulmonary process.    Wyatt Portela, MD   06/29/2021 6:35 PM        .      Medical Decision Making   I am the first provider for this patient.    Vital Signs-Reviewed the patient's vital signs.       Pulse Oximetry Analysis - Normal 100 % on ra      EKG:  Interpreted by the EP.   Time Interpreted:    Rate: 109   Rhythm: Sinus Tachycardia    Interpretation: no acute changes        ED Course: discussed option of receiving monoclonal antibodies for covid and pt would like to .            Provider Notes: feeling better prior to discharge. Discussed results and out pt f/u, return for any problems.         Diagnosis     Clinical Impression:   1. COVID-19 virus infection    2. Renal insufficiency    3. Anemia, unspecified type        _______________________________    Attestations:  This note is prepared by Avanell Shackleton, MD.     Avanell Shackleton, MD.  I confirm that the note above accurately reflects all work, treatment, procedures, and medical decision making performed by me.    _______________________________         Azzie Glatter, MD  07/01/21 [phone]

## 2021-06-30 LAB — ECG 12-LEAD
Atrial Rate: 109 {beats}/min
P Axis: 46 degrees
P-R Interval: 158 ms
Q-T Interval: 342 ms
QRS Duration: 74 ms
QTC Calculation (Bezet): 460 ms
R Axis: 6 degrees
T Axis: 78 degrees
Ventricular Rate: 109 {beats}/min

## 2021-08-20 DIAGNOSIS — G5620 Lesion of ulnar nerve, unspecified upper limb: Secondary | ICD-10-CM | POA: Insufficient documentation

## 2021-10-01 ENCOUNTER — Emergency Department: Payer: No Typology Code available for payment source

## 2021-10-01 ENCOUNTER — Emergency Department
Admission: EM | Admit: 2021-10-01 | Discharge: 2021-10-01 | Disposition: A | Discharge status: Home / Self Care | Payer: No Typology Code available for payment source | Attending: Emergency Medicine | Admitting: Emergency Medicine

## 2021-10-01 DIAGNOSIS — G5621 Lesion of ulnar nerve, right upper limb: Secondary | ICD-10-CM | POA: Insufficient documentation

## 2021-10-01 DIAGNOSIS — M25521 Pain in right elbow: Secondary | ICD-10-CM | POA: Insufficient documentation

## 2021-10-01 DIAGNOSIS — S66911A Strain of unspecified muscle, fascia and tendon at wrist and hand level, right hand, initial encounter: Secondary | ICD-10-CM | POA: Insufficient documentation

## 2021-10-01 DIAGNOSIS — W19XXXA Unspecified fall, initial encounter: Secondary | ICD-10-CM | POA: Insufficient documentation

## 2021-10-01 MED ORDER — METHYLPREDNISOLONE 4 MG PO TBPK
ORAL_TABLET | ORAL | 0 refills | Status: DC
Start: 2021-10-01 — End: 2021-11-14

## 2021-10-01 MED ORDER — OXYCODONE-ACETAMINOPHEN 5-325 MG PO TABS
1.0000 | ORAL_TABLET | Freq: Once | ORAL | Status: AC
Start: 2021-10-01 — End: 2021-10-01
  Administered 2021-10-01: 1 via ORAL
  Filled 2021-10-01: qty 1

## 2021-10-01 MED ORDER — IBUPROFEN 600 MG PO TABS
600.0000 mg | ORAL_TABLET | Freq: Once | ORAL | Status: AC
Start: 2021-10-01 — End: 2021-10-01
  Administered 2021-10-01: 600 mg via ORAL
  Filled 2021-10-01: qty 1

## 2021-10-01 MED ORDER — HYDROCODONE-ACETAMINOPHEN 5-325 MG PO TABS
1.0000 | ORAL_TABLET | Freq: Four times a day (QID) | ORAL | 0 refills | Status: AC | PRN
Start: 2021-10-01 — End: 2021-10-08

## 2021-10-01 NOTE — ED Triage Notes (Signed)
[facility] EMERGENCY DEPARTMENT Provider in Triage Note        Patient Name: [name]    Chief Complaint:   Chief Complaint   Patient presents with    Arm Injury       HPI: [name] is a 40 y.o. female, who has had a rapid medical screening evaluation initiated by myself for the chief complaint of right arm pain x 4 days s/p fall.  Patient reports she fell on her hands and knees on the train.  Denies head strike.  Reports R ulnar and median nerve issues; seeing neurology and supposed to have ulnar and carpal tunnel releases, but needed new neurologist 2/2 insurance changes. Tried gabapentin and symbalta w/o relief.  Denies CP, dyspnea, fever.      ROS: Please see HPI.     Medical/Surgical/Social history: as per HPI    Vitals: BP (!) 173/105    Pulse 94    Temp 98.8 F (37.1 C)    Resp 18    Wt 133.4 kg    SpO2 97%    BMI 47.45 kg/m     Pertinent brief exam:   Constitutional: No acute distress.Well-nourished.  Cardiovascular: Normal heart rate.   Pulmonary: Normal effort.  No respiratory distress. No stridor.  Skin: Normal skin color. No diaphoresis.  Psych: Normal affect.     Additional pertinent physical exam findings: holding right arm in flexion. +ulnar tinnel and median tinnel sign.     Preliminary orders: none    Symptom based diagnosis: right arm pain      Patient advised to remain in the ED until further evaluation can be performed. Patient instructed to notify staff of any changes in condition while waiting.    This assessment is an initial evaluation to expedite care.

## 2021-10-01 NOTE — EDIE (Signed)
COLLECTIVE?NOTIFICATION?10/01/2021 17:16?[name], [name]?MRN: [id number]    Criteria Met      5 ED Visits in 12 Months    Security and Safety  No Security Events were found.  ED Care Guidelines  There are currently no ED Care Guidelines for this patient. Please check your facility's medical records system.        Prescription Monitoring Program  040??- Narcotic Use Score  020??- Sedative Use Score  000??- Stimulant Use Score  190??- Overdose Risk Score  - All Scores range from 000-999 with 75% of the population scoring < 200 and on 1% scoring above 650  - The last digit of the narcotic, sedative, and stimulant score indicates the number of active prescriptions of that type  - Higher Use scores correlate with increased prescribers, pharmacies, mg equiv, and overlapping prescriptions  - Higher Overdose Risk Scores correlate with increased risk of unintentional overdose death   Concerning or unexpectedly high scores should prompt a review of the PMP record; this does not constitute checking PMP for prescribing purposes.    E.D. Visit Count (12 mo.)  Facility Visits   [facility]   [facility] [facility] 1   [facility] 4   Total 6   Note: Visits indicate total known visits.     Recent Emergency Department Visit Summary  Date Facility [facility] Type Diagnoses or Chief Complaint    Oct 01, 2021  [place] [facility] [place] [facility]  [place]  [place]  Emergency      Pinched Nerve Right side/Stiff      Aug 16, 2021  [facility]  [place]  Urgent Care      Localized edema      Other forms of dyspnea      Jun 29, 2021  [place] [facility] [place] [facility]  [place]  [place]  Emergency      body aches, vomiting, diarrhea      Generalized Body Aches      Diarrhea      Emesis      Dizziness      Headache      COVID-19      Disorder of kidney and ureter, unspecified      Anemia, unspecified      Apr 29, 2021  [place] [facility] [place] [facility]  [place]  [place]  Emergency      Syncope      Syncope and collapse      Dizziness and  giddiness      Abnormal finding of blood chemistry, unspecified      Other specified abnormal findings of blood chemistry      Apr 24, 2021  [facility] [place]  [place]  Emergency      1. Syncope and collapse      2. 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 Heart failure, unspecified      4. Type 1 diabetes mellitus with diabetic chronic kidney disease      5. Long term (current) use of insulin      6. Long term (current) use of anticoagulants      7. Contact with and (suspected) exposure to COVID-19      8. Personal history of pulmonary embolism      9. Other specified postprocedural states      10. Allergy status  to other antibiotic agents      Mar 18, 2021  [employer] [facility] [place] [facility]  [place]  [place]  Emergency      sob;asthma      Shortness of Breath      Cough      Disorder of kidney and ureter, unspecified      Unspecified asthma with (acute) exacerbation      Oct 21, 2020  [place] [facility]  [place]  [place]  Emergency      Leg Pain      Leg Pain; Fever      Pain in left foot      Unspecified fracture of left foot, subsequent encounter for fracture with nonunion      Unspecified fracture of left foot, initial encounter for closed fracture        Recent Inpatient Visit Summary  No Recent Inpatient Visits were found.  Care Team  Provider Specialty Phone Fax Service Dates   [facility] Clinic/Center: [facility]) [phone] Current    Ricard Dillon , MD Internal Medicine [phone] Current      Collective Portal  This patient has registered at the [facility] Emergency Department   For more information visit: [email or web address]     PLEASE NOTE:     1.   Any care recommendations  and other clinical information are provided as guidelines or for historical purposes only, and providers should exercise their own clinical judgment when providing care.    2.   You may only use this information for purposes of treatment, payment or health care operations activities, and subject to the limitations of applicable Collective Policies.    3.   You should consult directly with the organization that provided a care guideline or other clinical history with any questions about additional information or accuracy or completeness of information provided.    ? 2022 [place] - [email or web address]

## 2021-10-01 NOTE — ED Triage Notes (Signed)
[facility] EMERGENCY DEPARTMENT TRIAGE NOTE     [name] is a 40 y.o. female who presents to the ED with a chief complaint of: R shoulder/arm pain      Onset: x4 days   Pt presents from home w/ c/o R shoulder/arm pain x4 days s/p fall. States tripped & fell while on train & landed on hand. Pt reports has pinched ulnar nerve in R arm & has been seeing neurologist for pain/pain management. States pain has been significantly worse after fall.     LOC: A&Ox4    Vitals: There were no vitals taken for this visit.    Meds taken prior to ED arrival: N/A

## 2021-10-05 ENCOUNTER — Emergency Department: Payer: No Typology Code available for payment source

## 2021-10-05 ENCOUNTER — Emergency Department
Admission: EM | Admit: 2021-10-05 | Discharge: 2021-10-05 | Disposition: A | Discharge status: Home / Self Care | Payer: No Typology Code available for payment source | Attending: Emergency Medicine | Admitting: Emergency Medicine

## 2021-10-05 DIAGNOSIS — J029 Acute pharyngitis, unspecified: Secondary | ICD-10-CM | POA: Insufficient documentation

## 2021-10-05 DIAGNOSIS — N289 Disorder of kidney and ureter, unspecified: Secondary | ICD-10-CM

## 2021-10-05 DIAGNOSIS — I130 Hypertensive heart and chronic kidney disease with heart failure and stage 1 through stage 4 chronic kidney disease, or unspecified chronic kidney disease: Secondary | ICD-10-CM | POA: Insufficient documentation

## 2021-10-05 DIAGNOSIS — B349 Viral infection, unspecified: Secondary | ICD-10-CM | POA: Insufficient documentation

## 2021-10-05 DIAGNOSIS — I10 Essential (primary) hypertension: Secondary | ICD-10-CM

## 2021-10-05 DIAGNOSIS — I509 Heart failure, unspecified: Secondary | ICD-10-CM | POA: Insufficient documentation

## 2021-10-05 DIAGNOSIS — N189 Chronic kidney disease, unspecified: Secondary | ICD-10-CM | POA: Insufficient documentation

## 2021-10-05 DIAGNOSIS — E1122 Type 2 diabetes mellitus with diabetic chronic kidney disease: Secondary | ICD-10-CM | POA: Insufficient documentation

## 2021-10-05 HISTORY — DX: Other specified diabetes mellitus with unspecified diabetic retinopathy without macular edema: E13.319

## 2021-10-05 LAB — CBC AND DIFFERENTIAL
Absolute NRBC: 0 10*3/uL (ref 0.00–0.00)
Basophils Absolute Automated: 0.07 10*3/uL (ref 0.00–0.08)
Basophils Automated: 0.6 %
Eosinophils Absolute Automated: 0.31 10*3/uL (ref 0.00–0.44)
Eosinophils Automated: 2.6 %
Hematocrit: 28.5 % — ABNORMAL LOW (ref 34.7–43.7)
Hgb: 8.7 g/dL — ABNORMAL LOW (ref 11.4–14.8)
Immature Granulocytes Absolute: 0.06 10*3/uL (ref 0.00–0.07)
Immature Granulocytes: 0.5 %
Lymphocytes Absolute Automated: 3.4 10*3/uL — ABNORMAL HIGH (ref 0.42–3.22)
Lymphocytes Automated: 28.7 %
MCH: 26.3 pg (ref 25.1–33.5)
MCHC: 30.5 g/dL — ABNORMAL LOW (ref 31.5–35.8)
MCV: 86.1 fL (ref 78.0–96.0)
MPV: 10.7 fL (ref 8.9–12.5)
Monocytes Absolute Automated: 0.71 10*3/uL (ref 0.21–0.85)
Monocytes: 6 %
Neutrophils Absolute: 7.31 10*3/uL — ABNORMAL HIGH (ref 1.10–6.33)
Neutrophils: 61.6 %
Nucleated RBC: 0 /100 WBC (ref 0.0–0.0)
Platelets: 355 10*3/uL — ABNORMAL HIGH (ref 142–346)
RBC: 3.31 10*6/uL — ABNORMAL LOW (ref 3.90–5.10)
RDW: 13 % (ref 11–15)
WBC: 11.86 10*3/uL — ABNORMAL HIGH (ref 3.10–9.50)

## 2021-10-05 LAB — ECG 12-LEAD
Atrial Rate: 91 {beats}/min
IHS MUSE NARRATIVE AND IMPRESSION: NORMAL
P Axis: 57 degrees
P-R Interval: 154 ms
Q-T Interval: 366 ms
QRS Duration: 78 ms
QTC Calculation (Bezet): 450 ms
R Axis: -5 degrees
T Axis: 102 degrees
Ventricular Rate: 91 {beats}/min

## 2021-10-05 LAB — BASIC METABOLIC PANEL
Anion Gap: 8 (ref 5.0–15.0)
BUN: 42 mg/dL — ABNORMAL HIGH (ref 7.0–21.0)
CO2: 21 mEq/L (ref 17–29)
Calcium: 8.8 mg/dL (ref 8.5–10.5)
Chloride: 108 mEq/L (ref 99–111)
Creatinine: 1.6 mg/dL — ABNORMAL HIGH (ref 0.4–1.0)
Glucose: 209 mg/dL — ABNORMAL HIGH (ref 70–100)
Potassium: 4.5 mEq/L (ref 3.5–5.3)
Sodium: 137 mEq/L (ref 135–145)

## 2021-10-05 LAB — COVID-19 (SARS-COV-2) & INFLUENZA  A/B, NAA (ROCHE LIAT)
Influenza A: NOT DETECTED
Influenza B: NOT DETECTED
SARS CoV 2 Overall Result: NOT DETECTED

## 2021-10-05 LAB — TROPONIN I: Troponin I: <0.01 ng/mL (ref 0.00–0.05)

## 2021-10-05 LAB — GFR: EGFR: 35.3

## 2021-10-05 NOTE — Discharge Instructions (Signed)
Dear Ms. Adelsberger:    Thank you for choosing the [facility] Emergency Department, the premier emergency department in the [place] area.  I hope your visit today was EXCELLENT. You will receive a survey via text message that will give you the opportunity to provide feedback to your team about your visit. Please do not hesitate to reach out with any questions!    Specific instructions for your visit today:        IF YOU DO NOT CONTINUE TO IMPROVE OR YOUR CONDITION WORSENS, PLEASE CONTACT YOUR DOCTOR OR RETURN IMMEDIATELY TO THE EMERGENCY DEPARTMENT.    Sincerely,  Ria Bush, MD  Attending Emergency Physician  [facility] Emergency Department      OBTAINING A PRIMARY CARE APPOINTMENT    Primary care physicians (PCPs, also known as primary care doctors) are either internists or family medicine doctors. Both types of PCPs focus on health promotion, disease prevention, patient education and counseling, and treatment of acute and chronic medical conditions.    If you need a primary care doctor, please call the below number and ask who is receiving new patients.     [facility]  Telephone:  [phone]  [email or web address]    DOCTOR REFERRALS  Call [phone] (available 24 hours a day, 7 days a week) if you need any further referrals and we can help you find a primary care doctor or specialist.  Also, available online at:  [email or web address]    YOUR CONTACT INFORMATION  Before leaving please check with registration to make sure we have an up-to-date contact number.  You can call registration at [phone] to update your information.  For questions about your hospital bill, please call [phone].  For questions about your Emergency Dept Physician bill please call [phone].      FREE HEALTH SERVICES  If you need help with health or social services, please call 2-1-1 for a free referral to resources in your area.  2-1-1 is a free service connecting people with  information on health insurance, free clinics, pregnancy, mental health, dental care, food assistance, housing, and substance abuse counseling.  Also, available online at:  http://www.[place].org    ORTHOPEDIC INJURY   Please know that significant injuries can exist even when an initial x-ray is read as normal or negative.  This can occur because some fractures (broken bones) are not initially visible on x-rays.  For this reason, close outpatient follow-up with your primary care doctor or bone specialist (orthopedist) is required.    MEDICATIONS AND FOLLOWUP  Please be aware that some prescription medications can cause drowsiness.  Use caution when driving or operating machinery.    The examination and treatment you have received in our Emergency Department is provided on an emergency basis, and is not intended to be a substitute for your primary care physician.  It is important that your doctor checks you again and that you report any new or remaining problems at that time.      ASSISTANCE WITH INSURANCE    Affordable Care Act  [facility])  Call to start or finish an application, compare plans, enroll or ask a question.  [phone]  TTY: [phone]  Web:  Healthcare.gov    Help Enrolling in [facility]  [employer] [place]  226-087-1693 (TOLL-FREE)  ([phone]) 020-8827 (TTY)  Web:  Http://www.coverva.org    Local Help Enrolling in the [facility]  [place] [facility]  4065700048 (MAIN)  Email:  health-help@nvfs .org  Web:  BlackjackMyths.is  Address:  [address], [address] [place],  [address]    SEDATING MEDICATIONS  Sedating medications include strong pain medications (e.g. narcotics), muscle relaxers, benzodiazepines (used for anxiety and as muscle relaxers), Benadryl/diphenhydramine and other antihistamines for allergic reactions/itching, and other medications.  If you are unsure if you have received a sedating medication, please ask your physician or nurse.  If you received a sedating  medication: DO NOT drive a car. DO NOT operate machinery. DO NOT perform jobs where you need to be alert.  DO NOT drink alcoholic beverages while taking this medicine.     If you get dizzy, sit or lie down at the first signs. Be careful going up and down stairs.  Be extra careful to prevent falls.     Never give this medicine to others.     Keep this medicine out of reach of children.     Do not take or save old medicines. Throw them away when outdated.     Keep all medicines in a cool, dry place. DO NOT keep them in your bathroom medicine cabinet or in a cabinet above the stove.    MEDICATION REFILLS  Please be aware that we cannot refill any prescriptions through the ER. If you need further treatment from what is provided at your ER visit, please follow up with your primary care doctor or your pain management specialist.    FREESTANDING EMERGENCY DEPARTMENTS OF [facility]  Did you know [facility] has two freestanding ERs located just a few miles away?  [facility] [place] and [facility] [place] have short wait times, easy free parking directly in front of the building and top patient satisfaction scores - and the same Board Certified Emergency Medicine doctors as  [facility].

## 2021-10-05 NOTE — ED Provider Notes (Signed)
[facility] EMERGENCY DEPARTMENT H&P         CLINICAL SUMMARY          Diagnosis:    .     Final diagnoses:   Viral syndrome   Renal insufficiency   Hypertension, unspecified type         MDM Notes:      Preliminary:  #Shortness of breath chest pain body aches joint pain sore throat cough scant sputum.  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  Differential Diagnosis:  1) rule out viral illness influenza COVID rule out UTI rule out pneumonia  2) constellation is not highly suggestive of pulmonary embolism despite her known history of previous pulmonary embolism.  Notably she is on anticoagulation which makes breakthrough clot unlikely.  Pulm embolism would not explain her body aches joint pain and other symptoms  3)   4)     Assessment and Plan:  Labs, XR, Viral studies    Summary:   Body aches body pain chest discomfort shortness of breath "feeling like shocking pain in her joints.".  Emergency department evaluation identified no emergent condition.  Suspect underlying viral illness.  Negative for flu and COVID chest x-ray unrevealing  Renal insufficiency consistent with previous baseline numbers.  Patient felt better in the emergency department discharge outpatient follow-up    Disposition:         Discharge         Discharge Prescriptions    None                   CLINICAL INFORMATION        HPI:      Chief Complaint: Shortness of Breath and Chest Pain  .    [name] is a 40 y.o. female with PMHx of CHF, CKD, PE, DM, HTN, HLD, vertigo, arthritis who presents with sore throat chest pain body aches headache dizziness joint pain states that she has chronic renal insufficiency and is followed closely at feels that her whole body is swollen she history of previous pulmonary embolism for which she is on Eliquis.    History obtained from: patient, review of prior chart      ROS:      Positive and negative ROS elements as per HPI.  All other systems  reviewed and negative.      Physical Exam:      Pulse (!) 101  BP (!) 195/94  Resp 20  SpO2 98 %  Temp 99.1 F (37.3 C)    Physical Exam  Vitals and nursing note reviewed.   Constitutional:       General: She is not in acute distress.     Appearance: She is well-developed. She is not diaphoretic.   HENT:      Head: Normocephalic and atraumatic.      Comments: Uvula midline no appreciable tenderness no exudate     Right Ear: External ear normal.      Left Ear: External ear normal.   Eyes:      Conjunctiva/sclera: Conjunctivae normal.      Pupils: Pupils are equal, round, and reactive to light.   Neck:      Thyroid: No thyromegaly.      Trachea: No tracheal deviation.   Cardiovascular:      Rate and Rhythm: Normal rate and regular rhythm.      Heart sounds: Normal heart sounds. No murmur heard.    No friction rub. No  gallop.   Pulmonary:      Effort: Pulmonary effort is normal. No respiratory distress.      Breath sounds: Normal breath sounds. No stridor. No wheezing or rales.   Abdominal:      General: There is no distension.      Palpations: Abdomen is soft.      Tenderness: There is no abdominal tenderness. There is no guarding or rebound.   Musculoskeletal:         General: No tenderness. Normal range of motion.      Cervical back: Normal range of motion and neck supple.      Comments: No appreciable pitting edema patient states calf tenderness but has tenderness everywhere she is touched.   Skin:     General: Skin is warm and dry.      Findings: No erythema or rash.   Neurological:      Mental Status: She is alert and oriented to person, place, and time.      Cranial Nerves: No cranial nerve deficit.      Coordination: Coordination normal.   Psychiatric:         Behavior: Behavior normal.         Thought Content: Thought content normal.         Judgment: Judgment normal.                 PAST HISTORY        Primary Care Provider: Milus Banister, MD        PMH/PSH:    .     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      Social/Family History:      She reports that she has never smoked. She has never used smokeless tobacco. She reports that she does not drink alcohol and does not use drugs.    Family History   Problem Relation Age of Onset    Heart disease Mother     Hypertension Mother     Diabetes Mother     Asthma Father     Hypertension Father     Diabetes Daughter     Asthma Son     Cancer Maternal Aunt     Cancer Maternal Grandmother          Listed Medications on Arrival:    .     Previous Medications    ALBUTEROL SULFATE HFA (PROVENTIL) 108 (90 BASE) MCG/ACT INHALER    Inhale 2 puffs into the lungs every 4 (four) hours as needed for Wheezing    ALCOHOL SWABS PADS    Use as indicated for insulin injections    APIXABAN (ELIQUIS) 5 MG    Take 5 mg by mouth every 12 (twelve) hours    ASPIRIN 81 MG CHEWABLE TABLET    Chew 81 mg by mouth daily    ATORVASTATIN (LIPITOR) 20 MG TABLET    Take 20 mg by mouth nightly  ATORVASTATIN (LIPITOR) 20 MG TABLET    1 tablet    BACLOFEN (LIORESAL) 10 MG TABLET    Take 10 mg by mouth as needed    BUMETANIDE (BUMEX) 2 MG TABLET    Take 1 tablet (2 mg total) by mouth 2 (two) times daily    BUSPIRONE HCL (BUSPAR PO)    Take 25 mg by mouth daily    CARVEDILOL (COREG) 25 MG TABLET    Take 25 mg by mouth 2 (two) times daily    FOLIC ACID (FOLVITE) 1 MG TABLET    Take 1 mg by mouth daily    GABAPENTIN (NEURONTIN) 600 MG TABLET    Take 800 mg by mouth 3 (three) times daily    HYDRALAZINE (APRESOLINE) 50 MG TABLET    Take 50 mg by mouth 2 (two) times daily    HYDROCODONE-ACETAMINOPHEN (NORCO) 5-325 MG  PER TABLET    Take 1 tablet by mouth every 6 (six) hours as needed for Pain    HYDROXYZINE (ATARAX) 10 MG TABLET    Take 10 mg by mouth every 6 (six) hours as needed for Anxiety    INSULIN DETEMIR (LEVEMIR) 100 UNIT/ML INJECTION    Inject 50 Units into the skin every 12 (twelve) hours    INSULIN LISPRO (HUMALOG) 100 UNIT/ML INJECTION    Inject 25 Units into the skin 3 (three) times daily before meals    INSULIN SYRINGE-NEEDLE U-100 27G X 1/2" 1 ML MISC    Inject 1 Dose into the skin 4 (four) times daily    LIDOCAINE (LIDODERM) 5 %    Place 1 patch onto the skin every 24 hours Remove & Discard patch within 12 hours or as directed by MD    LIRAGLUTIDE (VICTOZA) 18 MG/3ML INJECTION    Inject 0.6 mg into the skin daily    MECLIZINE (ANTIVERT) 25 MG TABLET    Take 25 mg by mouth as needed    METHYLPREDNISOLONE (MEDROL DOSEPAK) 4 MG TABLET    Use as directed    MONTELUKAST (SINGULAIR) 10 MG TABLET    Take 10 mg by mouth nightly    NON FORMULARY    daily BRIO    OLMESARTAN (BENICAR) 40 MG TABLET    Take 40 mg by mouth daily    OMEPRAZOLE (PRILOSEC) 40 MG CAPSULE    Take 40 mg by mouth nightly    TOPIRAMATE (TOPAMAX) 100 MG TABLET    Take 100 mg by mouth daily    VALACYCLOVIR (VALTREX) 1000 MG TABLET    Take 1 g by mouth as needed    VILAZODONE HCL 40 MG TAB    Take by mouth      Allergies: She is allergic to bactrim [sulfamethoxazole-trimethoprim], bactrim [sulfamethoxazole-trimethoprim], glipizide, and lisinopril.            VISIT INFORMATION        Clinical Course in the ED:            Medications Given in the ED:    .     ED Medication Orders (From admission, onward)      None              Procedures:      Procedures      Interpretations:      O2 sat-           saturation: 98 %; Oxygen use: room air; Interpretation: Normal    EKG -  interpreted by me: normal sinus at 90 QRS axis 0 left ventricular hypertrophy no appreciable ST segment elevation or depression is noted.    Monitor -         interpreted by me:  normal sinus at 90s.               RESULTS        Lab Results:      Results       Procedure Component Value Units Date/Time    COVID-19 (SARS-CoV-2) and Influenza A/B, NAA ([place])- Age 71 and above [[id number]] Collected: 10/05/21 1426    Specimen: Culturette from Nasopharyngeal Updated: 10/05/21 1554     Purpose of COVID testing Diagnostic -PUI     SARS-CoV-2 Specimen Source Nasal Swab     SARS CoV 2 Overall Result Not Detected     Influenza A Not Detected     Influenza B Not Detected    Narrative:      o Collect and clearly label specimen type:  o PREFERRED-Upper respiratory specimen: One Nasal Swab in  Transport Media.  o Hand deliver to laboratory ASAP  Diagnostic -PUI    Troponin I [[id number]] Collected: 10/05/21 1426    Specimen: Blood Updated: 10/05/21 1545     Troponin I <0.01 ng/mL     Basic Metabolic Panel [[id number]]  (Abnormal) Collected: 10/05/21 1426    Specimen: Blood Updated: 10/05/21 1540     Glucose 209 mg/dL      BUN 78.4 mg/dL      Creatinine 1.6 mg/dL      Calcium 8.8 mg/dL      Sodium 696 mEq/L      Potassium 4.5 mEq/L      Chloride 108 mEq/L      CO2 21 mEq/L      Anion Gap 8.0    GFR [[id number]] Collected: 10/05/21 1426     Updated: 10/05/21 1540     EGFR 35.3       CBC and differential [[id number]]  (Abnormal) Collected: 10/05/21 1426    Specimen: Blood Updated: 10/05/21 1511     WBC 11.86 x10 3/uL      Hgb 8.7 g/dL      Hematocrit 36.6 %      Platelets 355 x10 3/uL      RBC 3.31 x10 6/uL      MCV 86.1 fL      MCH 26.3 pg      MCHC 30.5 g/dL      RDW 13 %      MPV 10.7 fL      Neutrophils 61.6 %      Lymphocytes Automated 28.7 %      Monocytes 6.0 %      Eosinophils Automated 2.6 %      Basophils Automated 0.6 %      Immature Granulocytes 0.5 %      Nucleated RBC 0.0 /100 WBC      Neutrophils Absolute 7.31 x10 3/uL      Lymphocytes Absolute Automated 3.40 x10 3/uL      Monocytes Absolute Automated 0.71 x10 3/uL      Eosinophils Absolute Automated 0.31 x10 3/uL      Basophils Absolute  Automated 0.07 x10 3/uL      Immature Granulocytes Absolute 0.06 x10 3/uL      Absolute NRBC 0.00 x10 3/uL                 Radiology Results:  Chest 2 Views   Final Result      No acute cardiopulmonary process.      Clide Cliff, MD    10/05/2021 4:11 PM                  Scribe Attestation:      I was acting as a Neurosurgeon for Ria Bush, MD on [name]  Treatment Team: Scribe: Felipa Emory    I am the first provider for this patient and I personally performed the services documented. Treatment Team: Scribe: Felipa Emory is scribing for me on [name]. This note accurately reflects work and decisions made by me.  Ria Bush, MD       *This note was generated by the Epic EMR system/ Dragon speech recognition and may contain inherent errors or omissions not intended by the user. Grammatical errors, random word insertions, deletions, pronoun errors and incomplete sentences are occasional consequences of this technology due to software limitations. Not all errors are caught or corrected. If there are questions or concerns about the content of this note or information contained within the body of this dictation they should be addressed directly with the author for clarification.       Ria Bush, MD  10/06/21 [phone]

## 2021-10-05 NOTE — EDIE (Signed)
COLLECTIVE?NOTIFICATION?10/05/2021 13:47?[name], [name]?MRN: [id number]    Criteria Met      5 ED Visits in 12 Months    3 Different Facilities in 90 Days    Security and Safety  No Security Events were found.  ED Care Guidelines  There are currently no ED Care Guidelines for this patient. Please check your facility's medical records system.        Prescription Monitoring Program  110??- Narcotic Use Score  060??- Sedative Use Score  000??- Stimulant Use Score  140??- Overdose Risk Score  - All Scores range from 000-999 with 75% of the population scoring < 200 and on 1% scoring above 650  - The last digit of the narcotic, sedative, and stimulant score indicates the number of active prescriptions of that type  - Higher Use scores correlate with increased prescribers, pharmacies, mg equiv, and overlapping prescriptions  - Higher Overdose Risk Scores correlate with increased risk of unintentional overdose death   Concerning or unexpectedly high scores should prompt a review of the PMP record; this does not constitute checking PMP for prescribing purposes.    E.D. Visit Count (12 mo.)  Facility Visits   [facility] 1   [place] [facility] 1   [place] [facility] 4   [facility] 1   Total 7   Note: Visits indicate total known visits.     Recent Emergency Department Visit Summary  Date Facility [facility] Type Diagnoses or Chief Complaint    Oct 05, 2021  [facility]  [place]  [place]  Emergency      triage      Oct 01, 2021  [place] [facility] [place] [facility]  [place]  [place]  Emergency      Pinched Nerve Right side/Stiff      Arm Injury      Unspecified fall, initial encounter      Strain of unspecified muscle, fascia and tendon at wrist and hand level, right hand, initial encounter      Lesion of ulnar nerve, right upper limb      Aug 16, 2021  [facility] - Visits  [place]  [place]  Urgent Care      Localized edema      Other forms of dyspnea      Jun 29, 2021  [place] [facility] [place] [facility]  [place]   [place]  Emergency      body aches, vomiting, diarrhea      Generalized Body Aches      Diarrhea      Emesis      Dizziness      Headache      COVID-19      Disorder of kidney and ureter, unspecified      Anemia, unspecified      Apr 29, 2021   - [place] [facility]  [place]  [place]  Emergency      Syncope      Syncope and collapse      Dizziness and giddiness      Abnormal finding of blood chemistry, unspecified      Other specified abnormal findings of blood chemistry      Apr 24, 2021  [facility]  [place]  Emergency      1. Syncope and collapse      2. Hypertensive heart and chronic kidney disease with heart failure and stage 1 through stage 4 chronic kidney disease, or unspecified chronic kidney disease  3. Heart failure, unspecified      4. Type 1 diabetes mellitus with diabetic chronic kidney disease      5. Long term (current) use of insulin      6. Long term (current) use of anticoagulants      7. Contact with and (suspected) exposure to COVID-19      8. Personal history of pulmonary embolism      9. Other specified postprocedural states      10. Allergy status to other antibiotic agents      Mar 18, 2021  [place] [facility] [place] [facility]  [place]  [place]  Emergency      sob;asthma      Shortness of Breath      Cough      Disorder of kidney and ureter, unspecified      Unspecified asthma with (acute) exacerbation      Oct 21, 2020  [place] [facility]  [place]  [place]  Emergency      Leg Pain      Leg Pain; Fever      Pain in left foot      Unspecified fracture of left foot, subsequent encounter for fracture with nonunion      Unspecified fracture of left foot, initial encounter for closed fracture        Recent Inpatient Visit Summary  No Recent Inpatient Visits were found.  Care Team  Provider Specialty Phone Fax Service Dates   [facility] Clinic/Center: [facility]) [phone] Current     Ricard Dillon , MD Internal Medicine [phone] Current      Collective Portal  This patient has registered at the [facility] Emergency Department   For more information visit: [email or web address]     PLEASE NOTE:     1.   Any care recommendations and other clinical information are provided as guidelines or for historical purposes only, and providers should exercise their own clinical judgment when providing care.    2.   You may only use this information for purposes of treatment, payment or health care operations activities, and subject to the limitations of applicable Collective Policies.    3.   You should consult directly with the organization that provided a care guideline or other clinical history with any questions about additional information or accuracy or completeness of information provided.    ? 2022 [place], [employer] - [email or web address]

## 2021-10-07 ENCOUNTER — Encounter (INDEPENDENT_AMBULATORY_CARE_PROVIDER_SITE_OTHER): Payer: Self-pay

## 2021-10-08 NOTE — ED Provider Notes (Signed)
EMERGENCY DEPARTMENT HISTORY AND PHYSICAL EXAM     None        Date: 10/01/2021  Patient Name: [name]    History of Presenting Illness     Chief Complaint   Patient presents with    Arm Injury       History Provided By: Patient    Chief Complaint: right arm pain  Onset: chronic, worse x 4 days   Timing: Waxing and Waning  Location: right elbow and wrist  Quality: throbbing   Severity: Severe  Exacerbating factors: movement  Alleviating factors: none, taking prescribed gabapentin with little relief  Associated Symptoms: numbness in right hand  Pertinent Negatives: No weakness, no head injury, no chest pain or shortness of breath    Additional History: [name] is a 40 y.o. female presenting to the ED with a chief complaint of worsening right arm pain.  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  Had previously been seen by a hand surgeon at [facility] with plans for surgery but then her insurance changed and she has not been able to find a new Careers adviser.  Reports chronic moderate to severe pain in the right elbow and arm and chronic numbness in the right hand.  She fell 4 days ago, landed on her outstretched right hand on a train.  She did not hit her head.  Denies any symptoms preceding her fall.  She has been having worsening pain since the fall.  Prescribed gabapentin which has not helped, no other pain medications taken today.  No other injuries.    PCP: Milus Banister, MD  SPECIALISTS:    No current facility-administered medications for this encounter.     Current Outpatient Medications   Medication Sig Dispense Refill    albuterol sulfate HFA (PROVENTIL) 108 (90 Base) MCG/ACT inhaler Inhale 2 puffs into the lungs every 4 (four) hours as needed for Wheezing 1 each 0    Alcohol Swabs Pads Use as indicated for insulin injections 120 each 3    apixaban (ELIQUIS) 5 MG Take 5 mg by mouth every 12 (twelve) hours      aspirin 81 MG  chewable tablet Chew 81 mg by mouth daily      atorvastatin (LIPITOR) 20 MG tablet Take 20 mg by mouth nightly      atorvastatin (LIPITOR) 20 MG tablet 1 tablet      baclofen (LIORESAL) 10 MG tablet Take 10 mg by mouth as needed      bumetanide (BUMEX) 2 MG tablet Take 1 tablet (2 mg total) by mouth 2 (two) times daily 60 tablet 0    busPIRone HCl (BUSPAR PO) Take 25 mg by mouth daily      carvedilol (COREG) 25 MG tablet Take 25 mg by mouth 2 (two) times daily      folic acid (FOLVITE) 1 MG tablet Take 1 mg by mouth daily      gabapentin (NEURONTIN) 600 MG tablet Take 800 mg by mouth 3 (three) times daily      hydrALAZINE (APRESOLINE) 50 MG tablet Take 50 mg by mouth 2 (two) times daily      HYDROcodone-acetaminophen (NORCO) 5-325 MG per tablet Take 1 tablet by mouth every 6 (six) hours as needed for Pain 12 tablet 0    hydrOXYzine (ATARAX) 10 MG tablet Take 10 mg by mouth every 6 (six) hours as needed for Anxiety  insulin detemir (Levemir) 100 UNIT/ML injection Inject 50 Units into the skin every 12 (twelve) hours (Patient taking differently: Inject 53 Units into the skin every 12 (twelve) hours) 30 mL 1    insulin lispro (HumaLOG) 100 UNIT/ML injection Inject 25 Units into the skin 3 (three) times daily before meals (Patient taking differently: Inject 25 Units into the skin 3 (three) times daily before meals) 30 mL 3    Insulin Syringe-Needle U-100 27G X 1/2" 1 ML Misc Inject 1 Dose into the skin 4 (four) times daily 500 each 3    lidocaine (LIDODERM) 5 % Place 1 patch onto the skin every 24 hours Remove & Discard patch within 12 hours or as directed by MD 15 patch 0    liraglutide (VICTOZA) 18 MG/3ML injection Inject 0.6 mg into the skin daily 3 mL 2    meclizine (ANTIVERT) 25 MG tablet Take 25 mg by mouth as needed      methylPREDNISolone (MEDROL DOSEPAK) 4 MG tablet Use as directed 21 tablet 0    montelukast (SINGULAIR) 10 MG tablet Take 10 mg by mouth nightly      NON FORMULARY daily BRIO      olmesartan  (BENICAR) 40 MG tablet Take 40 mg by mouth daily      omeprazole (PriLOSEC) 40 MG capsule Take 40 mg by mouth nightly      topiramate (TOPAMAX) 100 MG tablet Take 100 mg by mouth daily      valACYclovir (VALTREX) 1000 MG tablet Take 1 g by mouth as needed      Vilazodone HCl 40 MG Tab Take by mouth         Past History     Past Medical History:  Past Medical History:   Diagnosis Date    Arthritis     Asthma     Chronic kidney disease     Congestive heart failure     Diabetes mellitus     Fibromyalgia     Hyperlipidemia     Hypertension     Neuropathy     Pulmonary embolism 08/2019    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     Vertigo        Past Surgical History:  Past Surgical History:   Procedure Laterality Date    ABLATION ENDOMETRIAL      CESAREAN SECTION      CHOLECYSTECTOMY      EYE SURGERY      HYSTERECTOMY  12/2019       Family History:  Family History   Problem Relation Age of Onset    Heart disease Mother     Hypertension Mother     Diabetes Mother     Asthma Father     Hypertension Father     Diabetes Daughter     Asthma Son     Cancer Maternal Aunt     Cancer Maternal Grandmother        Social History:  Social History     Tobacco Use    Smoking status: Never    Smokeless tobacco: Never   Vaping Use    Vaping Use: Never used   Substance Use Topics    Alcohol use: Never    Drug use: Never       Allergies:  Allergies   Allergen Reactions    Bactrim [Sulfamethoxazole-Trimethoprim] Hives, Respiratory Distress and Edema    Bactrim [Sulfamethoxazole-Trimethoprim]     Glipizide Edema     Throat swelling  Lisinopril Edema     Tongue swelling       Review of Systems         Review of Systems   Constitutional:  Negative for activity change, appetite change and fever.   HENT:  Negative for congestion.    Eyes:  Negative for visual disturbance.   Respiratory:  Negative for shortness of breath.    Cardiovascular:  Negative for chest pain.   Gastrointestinal:  Negative for abdominal pain.   Genitourinary:  Negative for  flank pain.   Musculoskeletal:  Positive for arthralgias. Negative for back pain.   Skin:  Negative for color change.   Neurological:  Positive for numbness. Negative for weakness.   Hematological:  Does not bruise/bleed easily.   Psychiatric/Behavioral:  Negative for confusion.        Physical Exam   BP (!) 173/105   Pulse 94   Temp 98.8 F (37.1 C)   Resp 18   Wt 133.4 kg   SpO2 97%   BMI 47.45 kg/m         Physical Exam  Vitals and nursing note reviewed.   Constitutional:       Appearance: Normal appearance. She is obese.   HENT:      Head: Normocephalic and atraumatic.      Right Ear: External ear normal.      Left Ear: External ear normal.      Nose: Nose normal.      Mouth/Throat:      Mouth: Mucous membranes are moist.      Pharynx: Oropharynx is clear.   Eyes:      Conjunctiva/sclera: Conjunctivae normal.      Pupils: Pupils are equal, round, and reactive to light.   Cardiovascular:      Rate and Rhythm: Normal rate and regular rhythm.      Pulses: Normal pulses.      Heart sounds: Normal heart sounds. No murmur heard.    No friction rub. No gallop.   Pulmonary:      Effort: Pulmonary effort is normal.      Breath sounds: Normal breath sounds. No wheezing, rhonchi or rales.   Musculoskeletal:         General: Tenderness present.      Cervical back: Normal range of motion and neck supple. No tenderness.      Right lower leg: No edema.      Left lower leg: No edema.      Comments: Tenderness over the right elbow and right wrist, no tenderness in the anatomical snuffbox, no pain with axial thumb load  2+ ulna and radial pulses, full range of motion, no swelling, redness, warmth over joint   Skin:     General: Skin is warm and dry.      Capillary Refill: Capillary refill takes less than 2 seconds.   Neurological:      Mental Status: She is alert and oriented to person, place, and time.      Cranial Nerves: No cranial nerve deficit.      Sensory: Sensory deficit present.      Motor: No weakness.       Comments: Diminished sensation to light touch right ulna and median nerve distributions, positive Tinel test, +phalen test    Psychiatric:         Mood and Affect: Mood normal.         Behavior: Behavior normal.         Diagnostic Study  Results     Labs -     Results       ** No results found for the last 24 hours. **            Radiologic Studies -   Radiology Results (24 Hour)       Procedure Component Value Units Date/Time    Elbow Right AP Lateral and Obliques [[id number]] Collected: 10/01/21 1924    Order Status: Completed Updated: 10/01/21 1926    Narrative:      XR ELBOW RIGHT AP LATERAL AND OBLIQUES  CLINICAL INDICATION: fall, elbow pain    COMPARISON: None available.      Impression:      FINDINGS/ 4 views  were obtained.     There is no fracture. The joint alignment is well maintained. No  radiopaque foreign body is demonstrated.     No  fracture. No acute process.    Heron Nay, MD   10/01/2021 7:24 PM    Wrist Right PA Lateral And Oblique [[id number]] Collected: 10/01/21 1923    Order Status: Completed Updated: 10/01/21 1926    Narrative:      XR WRIST RIGHT 3+ VIEWS  CLINICAL INDICATION: fall, wrist pain    COMPARISON: None available.      Impression:      FINDINGS/ 4 views  were obtained.     There is no fracture. The joint alignment is well maintained. No  radiopaque foreign body is demonstrated.     No  fracture. No acute process.    Heron Nay, MD   10/01/2021 7:23 PM        .    Medical Decision Making   I am the first provider for this patient.    I reviewed the vital signs, available nursing notes, past medical history, past surgical history, family history and social history.    Vital Signs-Reviewed the patient's vital signs.   No data found.    Pulse Oximetry Analysis - Normal 97% on RA      Old Medical Records: Old medical records.  Nursing notes.     Procedure:     ED Course:     7:39PM-  Counseled on diagnosis, f/u plans, medication use, home/self care, and signs and symptoms when to  return to ED. Discussed diagnostic uncertainty and possibility of progression of illness. All questions from patient solicited and addressed. Pt expresses understanding, is stable, and amenable to discharge.         Provider Notes:   Patient presents with exacerbation of her chronic right arm and wrist pain after a fall.  Hypertensive, otherwise normal vital signs.  Patient physical exam consistent with ulnar and median neuropathy, normal strength, normal pulses, soft compartments.  X-rays negative for fracture or other traumatic injury.  Plan to prescribe pain medication, patient was given information for outpatient hand surgery follow-up for routine management of her neuropathy.        Diagnosis     Clinical Impression:   1. Fall, initial encounter    2. Muscle strain of right wrist, initial encounter    3. Ulnar neuropathy of right upper extremity        Treatment Plan:   ED Disposition       ED Disposition   Discharge    Condition   --    Date/Time   Tue Oct 01, 2021  7:39 PM    Comment   [name] discharge to home/self care.  Condition at disposition: Stable                   _______________________________      Dr. Fonnie Mu, MD, is the primary provider for this patient.   _______________________________     Debbora Lacrosse, MD  10/08/21 [phone]

## 2021-10-31 DIAGNOSIS — I5032 Chronic diastolic (congestive) heart failure: Secondary | ICD-10-CM | POA: Insufficient documentation

## 2021-10-31 DIAGNOSIS — E782 Mixed hyperlipidemia: Secondary | ICD-10-CM | POA: Insufficient documentation

## 2021-11-01 DIAGNOSIS — I7121 Aneurysm of the ascending aorta, without rupture: Secondary | ICD-10-CM | POA: Insufficient documentation

## 2021-11-14 ENCOUNTER — Ambulatory Visit: Payer: No Typology Code available for payment source

## 2021-11-14 NOTE — Pre-Procedure Instructions (Signed)
Important Instructions Before Your Procedure        Your case is currently scheduled for 11/21/2021 with Lynn Muster, MD.      No solid food after 11 pm the night before surgery.  May have clear liquids up to 4 hours prior to surgery.     The date and/or time of your surgery may change.  Your surgeon's office will notify you, up until the business day before surgery, if there is any change to your surgery date or time.  Please don't hesitate to call your surgeon's office directly with any questions.        IMPORTANT You must visit the Preparing for Your Procedure guide link below for additional instructions including fasting guidelines and directions before your procedure. If you received fasting or skin preparation instructions from your surgeon or pre-procedural provider, please follow those specific instructions. The instructions here are general instructions that do not pertain to all patients.  [email or web address]    If the link doesn't open, please copy and paste in your browser    QUESTIONS?  If you have any questions about your pre-procedural evaluation, please call the clinic location where your appointment was performed:     [facility]: [phone]  [facility]: [phone]  [facility]: [phone]  [place]: [phone]  [place]: [phone]

## 2021-11-14 NOTE — PSS Phone Screening (Signed)
Pre-Anesthesia Evaluation    Pre-op phone visit requested by:   Reason for pre-op phone visit: Patient anticipating REPAIR, RIGHT ULNAR NERVE AND NEUROPLASTY @ ELBOW AND WRIST procedure.    History of Present Illness/Summary:        Problem List:  Medical Problems       Hospital Problem List  Date Reviewed: 01/21/2021   None        Non-Hospital Problem List  Date Reviewed: 01/21/2021            ICD-10-CM Priority Class Noted    Acute renal failure superimposed on stage 3 chronic kidney disease N17.9, N18.30   08/11/2020    Drug-induced hyperglycemia R73.9, T50.905A   08/11/2020    Peripheral edema R60.9   08/20/2020    CHF NYHA class III, acute, combined I50.41   08/20/2020    Morbid obesity E66.01   08/21/2020    Hypertension I10   08/21/2020    Fibromyalgia M79.7   08/21/2020    Diabetic neuropathy associated with type 2 diabetes mellitus E11.40   08/21/2020    Type II diabetes mellitus, uncontrolled [id number]   01/21/2021    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   01/21/2021        Medical History   Diagnosis Date    Anemia     Arthritis     Asthma     Chronic kidney disease     Stage III    Congestive heart failure     Diabetes mellitus     Fibromyalgia     Gastroesophageal reflux disease     Hyperlipidemia     Hypertension     Neuropathy     Pulmonary embolism 08/2019    Renal insufficiency     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     Scoliosis     Sleep apnea     no cpap    Type 2 diabetes mellitus, controlled     Vertigo      Past Surgical History:   Procedure Laterality Date    ABLATION ENDOMETRIAL      APPENDECTOMY (OPEN)      CESAREAN SECTION      x 2    CHOLECYSTECTOMY      EYE SURGERY Bilateral     cataract    HYSTERECTOMY  12/2019    UTERINE RUPTURE         Current Outpatient Medications:     albuterol sulfate HFA (PROVENTIL) 108 (90 Base) MCG/ACT inhaler, Inhale 2 puffs into the lungs every 4 (four) hours as needed for Wheezing, Disp: 1 each, Rfl: 0    Alcohol Swabs Pads, Use as  indicated for insulin injections, Disp: 120 each, Rfl: 3    apixaban (ELIQUIS) 5 MG, Take 5 mg by mouth every 12 (twelve) hours, Disp: , Rfl:     ARIPiprazole (ABILIFY) 5 MG tablet, Take 10 mg by mouth daily, Disp: , Rfl:     aspirin 81 MG chewable tablet, Chew 81 mg by mouth daily, Disp: , Rfl:     atorvastatin (LIPITOR) 40 MG tablet, Take 80 mg by mouth nightly, Disp: , Rfl:     Azelastine-Fluticasone 137-50 MCG/ACT Suspension, 1 spray by Nasal route 2 (two) times daily, Disp: , Rfl:     baclofen (LIORESAL) 10 MG tablet, Take 10 mg by mouth as needed, Disp: , Rfl:     bumetanide (BUMEX) 2 MG tablet, Take 1 tablet (2 mg total) by mouth 2 (  two) times daily, Disp: 60 tablet, Rfl: 0    carvedilol (COREG) 25 MG tablet, Take 25 mg by mouth 2 (two) times daily, Disp: , Rfl:     cetirizine (ZyrTEC) 5 MG tablet, Take 5 mg by mouth daily, Disp: , Rfl:     diclofenac (VOLTAREN) 50 MG EC tablet, Take 50 mg by mouth daily, Disp: , Rfl:     docusate sodium (COLACE) 100 MG capsule, Take 100 mg by mouth nightly, Disp: , Rfl:     Dulera 200-5 MCG/ACT Aerosol, Inhale 2 puffs into the lungs daily, Disp: , Rfl:     DULoxetine (CYMBALTA) 60 MG capsule, Take 60 mg by mouth daily, Disp: , Rfl:     EPINEPHrine 0.15 MG/0.3ML injection, Inject 0.15 mg into the muscle as needed, Disp: , Rfl:     folic acid (FOLVITE) 1 MG tablet, Take 1 mg by mouth daily, Disp: , Rfl:     gabapentin (NEURONTIN) 800 MG tablet, Take 800 mg by mouth 3 (three) times daily, Disp: , Rfl:     hydrALAZINE (APRESOLINE) 50 MG tablet, Take 50 mg by mouth 2 (two) times daily, Disp: , Rfl:     hydrOXYzine (ATARAX) 10 MG tablet, Take 10 mg by mouth every 6 (six) hours as needed for Anxiety, Disp: , Rfl:     insulin detemir (Levemir) 100 UNIT/ML injection, Inject 50 Units into the skin every 12 (twelve) hours (Patient taking differently: Inject 53 Units into the skin every 12 (twelve) hours), Disp: 30 mL, Rfl: 1    insulin lispro (HumaLOG) 100 UNIT/ML injection, Inject 25  Units into the skin 3 (three) times daily before meals (Patient taking differently: Inject 25 Units into the skin 3 (three) times daily before meals), Disp: 30 mL, Rfl: 3    Insulin Syringe-Needle U-100 27G X 1/2" 1 ML Misc, Inject 1 Dose into the skin 4 (four) times daily, Disp: 500 each, Rfl: 3    lidocaine (LIDODERM) 5 %, Place 1 patch onto the skin every 24 hours Remove & Discard patch within 12 hours or as directed by MD, Disp: 15 patch, Rfl: 0    meclizine (ANTIVERT) 25 MG tablet, Take 25 mg by mouth as needed, Disp: , Rfl:     metOLazone (ZAROXOLYN) 10 MG tablet, Take 10 mg by mouth daily, Disp: , Rfl:     montelukast (SINGULAIR) 10 MG tablet, Take 10 mg by mouth nightly, Disp: , Rfl:     olmesartan (BENICAR) 40 MG tablet, Take 40 mg by mouth daily, Disp: , Rfl:     omeprazole (PriLOSEC) 40 MG capsule, Take 40 mg by mouth nightly, Disp: , Rfl:     topiramate (TOPAMAX) 100 MG tablet, Take 100 mg by mouth daily, Disp: , Rfl:     Trulicity 1.5 MG/0.5ML Solution Pen-injector, Inject 1.5 mg into the skin Once each week on Monday, Disp: , Rfl:     valACYclovir (VALTREX) 1000 MG tablet, Take 1 g by mouth as needed, Disp: , Rfl:     Vilazodone HCl 40 MG Tab, Take 1 tablet by mouth daily, Disp: , Rfl:      Medication List            Accurate as of November 14, 2021  4:25 PM. Always use your most recent med list.                albuterol sulfate HFA 108 (90 Base) MCG/ACT inhaler  Inhale 2 puffs into the lungs every 4 (four)  hours as needed for Wheezing  Commonly known as: PROVENTIL  Medication Adjustments for Surgery: Take as needed     Alcohol Swabs Pads  Use as indicated for insulin injections  Medication Adjustments for Surgery: Take as prescribed     apixaban 5 MG  Take 5 mg by mouth every 12 (twelve) hours  Commonly known as: ELIQUIS  Notes to patient: Follow MD instructions prior to surgery     ARIPiprazole 5 MG tablet  Take 10 mg by mouth daily  Commonly known as: ABILIFY  Medication Adjustments for Surgery: Take  morning of surgery     aspirin 81 MG chewable tablet  Chew 81 mg by mouth daily  Notes to patient: Follow MD instructions prior to surgery     atorvastatin 40 MG tablet  Take 80 mg by mouth nightly  Commonly known as: LIPITOR  Medication Adjustments for Surgery: Take as prescribed     Azelastine-Fluticasone 137-50 MCG/ACT Susp  1 spray by Nasal route 2 (two) times daily  Medication Adjustments for Surgery: Hold day of surgery     baclofen 10 MG tablet  Take 10 mg by mouth as needed  Commonly known as: LIORESAL  Medication Adjustments for Surgery: Hold day of surgery     bumetanide 2 MG tablet  Take 1 tablet (2 mg total) by mouth 2 (two) times daily  Commonly known as: BUMEX  Medication Adjustments for Surgery: Take morning of surgery     carvedilol 25 MG tablet  Take 25 mg by mouth 2 (two) times daily  Commonly known as: COREG  Medication Adjustments for Surgery: Take morning of surgery     cetirizine 5 MG tablet  Take 5 mg by mouth daily  Commonly known as: ZyrTEC  Medication Adjustments for Surgery: Hold day of surgery     diclofenac 50 MG EC tablet  Take 50 mg by mouth daily  Commonly known as: VOLTAREN  Medication Adjustments for Surgery: Stop 7 days before surgery     docusate sodium 100 MG capsule  Take 100 mg by mouth nightly  Commonly known as: COLACE  Medication Adjustments for Surgery: Take as prescribed     Dulera 200-5 MCG/ACT Aero  Inhale 2 puffs into the lungs daily  Generic drug: Mometasone Furo-Formoterol Fum  Medication Adjustments for Surgery: Take morning of surgery     DULoxetine 60 MG capsule  Take 60 mg by mouth daily  Commonly known as: CYMBALTA  Medication Adjustments for Surgery: Take morning of surgery     EPINEPHrine 0.15 MG/0.3ML injection  Inject 0.15 mg into the muscle as needed  Medication Adjustments for Surgery: Take as needed     folic acid 1 MG tablet  Take 1 mg by mouth daily  Commonly known as: FOLVITE  Medication Adjustments for Surgery: Hold day of surgery     gabapentin 800 MG  tablet  Take 800 mg by mouth 3 (three) times daily  Commonly known as: NEURONTIN  Notes to patient: Follow MD instructions prior to surgery     hydrALAZINE 50 MG tablet  Take 50 mg by mouth 2 (two) times daily  Commonly known as: APRESOLINE  Medication Adjustments for Surgery: Take morning of surgery     hydrOXYzine 10 MG tablet  Take 10 mg by mouth every 6 (six) hours as needed for Anxiety  Commonly known as: ATARAX  Medication Adjustments for Surgery: Take as needed     insulin lispro 100 UNIT/ML injection  Inject 25 Units into the  skin 3 (three) times daily before meals  Commonly known as: HumaLOG  Medication Adjustments for Surgery: Hold day of surgery     Insulin Syringe-Needle U-100 27G X 1/2" 1 ML Misc  Inject 1 Dose into the skin 4 (four) times daily  Medication Adjustments for Surgery: Take as prescribed     Levemir 100 UNIT/ML injection  Inject 50 Units into the skin every 12 (twelve) hours  Generic drug: insulin detemir  Notes to patient: Follow MD instructions prior to surgery     lidocaine 5 %  Place 1 patch onto the skin every 24 hours Remove & Discard patch within 12 hours or as directed by MD  Commonly known as: LIDODERM  Medication Adjustments for Surgery: Hold day of surgery     meclizine 25 MG tablet  Take 25 mg by mouth as needed  Commonly known as: ANTIVERT  Medication Adjustments for Surgery: Hold day of surgery     metOLazone 10 MG tablet  Take 10 mg by mouth daily  Commonly known as: ZAROXOLYN  Notes to patient: Follow MD instructions prior to surgery     montelukast 10 MG tablet  Take 10 mg by mouth nightly  Commonly known as: SINGULAIR  Medication Adjustments for Surgery: Take as prescribed     olmesartan 40 MG tablet  Take 40 mg by mouth daily  Commonly known as: BENICAR  Medication Adjustments for Surgery: Hold day of surgery     omeprazole 40 MG capsule  Take 40 mg by mouth nightly  Commonly known as: PriLOSEC  Medication Adjustments for Surgery: Take as prescribed     topiramate 100 MG  tablet  Take 100 mg by mouth daily  Commonly known as: TOPAMAX  Medication Adjustments for Surgery: Take morning of surgery     Trulicity 1.5 MG/0.5ML Sopn  Inject 1.5 mg into the skin Once each week on Monday  Generic drug: Dulaglutide  Medication Adjustments for Surgery: Take as prescribed     valACYclovir 1000 MG tablet  Take 1 g by mouth as needed  Commonly known as: VALTREX  Medication Adjustments for Surgery: Hold day of surgery     Vilazodone HCl 40 MG Tabs  Take 1 tablet by mouth daily  Medication Adjustments for Surgery: Take morning of surgery            Allergies   Allergen Reactions    Bactrim [Sulfamethoxazole-Trimethoprim] Hives, Respiratory Distress and Edema    Glipizide Edema     Throat swelling    Lisinopril Edema     Tongue swelling     Family History   Problem Relation Age of Onset    Heart disease Mother     Hypertension Mother     Diabetes Mother     Asthma Father     Hypertension Father     Diabetes Daughter     Asthma Son     Cancer Maternal Aunt     Cancer Maternal Grandmother      Social History     Occupational History    Not on file   Tobacco Use    Smoking status: Never    Smokeless tobacco: Never   Vaping Use    Vaping Use: Never used   Substance and Sexual Activity    Alcohol use: Never    Drug use: Never    Sexual activity: Not on file       Menstrual History:   LMP / Status  Hysterectomy  No LMP recorded. Patient has had a hysterectomy.    Tubal Ligation?  No valid surgical or medical questions entered.             Exam Scores:   SDB score Risk Category: OSA Diagnosed No PAP    PONV score Nausea Risk: VERY SEVERE RISK    MST score MST Score: 0    Allergy score Risk Category: Low Risk    Frailty score         Visit Vitals  Ht 1.676 m (5\' 6" )   Wt 133.4 kg (294 lb)   BMI 47.45 kg/m       Recent Labs   CBC (last 180 days) 06/29/21  1754 10/05/21  1426   WBC 11.19* 11.86*   RBC 3.50* 3.31*   Hgb 9.3* 8.7*   Hematocrit 30.1* 28.5*   MCV 86.0 86.1   MCH 26.6 26.3   MCHC 30.9* 30.5*    RDW 13 13   Platelets 347* 355*   MPV 10.0 10.7   Nucleated RBC 0.0 0.0   Absolute NRBC 0.00 0.00     Recent Labs   BMP (last 180 days) 06/29/21  1754 10/05/21  1426   Glucose 56* 209*   BUN 51.0* 42.0*   Creatinine 1.5* 1.6*   Sodium 140 137   Potassium 4.9 4.5   Chloride 108 108   CO2 22 21   Calcium 9.0 8.8   Anion Gap 10.0 8.0   EGFR 46.1 35.3         Recent Labs   Other (last 180 days) 06/29/21  1754   ALT 13   AST (SGOT) 15   Protein, Total 7.2       Bedside glucose dos

## 2021-12-04 NOTE — Anesthesia Preprocedure Evaluation (Signed)
Anesthesia Evaluation    AIRWAY    Mallampati: I    TM distance: >3 FB  Neck ROM: full  Mouth Opening:full   CARDIOVASCULAR    cardiovascular exam normal, regular and normal       DENTAL    no notable dental hx     PULMONARY    pulmonary exam normal and clear to auscultation     OTHER FINDINGS    Braces              Relevant Problems   CARDIO   (+) Hypertension      GU/RENAL   (+) Acute renal failure superimposed on stage 3 chronic kidney disease      ENDO   (+) Type 2 diabetes mellitus with diabetic chronic kidney disease               Anesthesia Plan    ASA 3     general                     intravenous induction   Detailed anesthesia plan: general endotracheal and general LMA      Post Op: plan for postoperative opioid use        informed consent obtained    Plan discussed with CRNA.    ECG reviewed  pertinent labs reviewed             Signed by: Freddrick March, DO 12/04/21 10:43 PM  .    ===============================================================    ADDITIONAL MEDICAL HISTORY      No LMP recorded. Patient has had a hysterectomy.    There is no height or weight on file to calculate BMI.                     Scheduled  Procedure(s):  REPAIR, RIGHT ULNAR NERVE AND NEUROPLASTY @ ELBOW AND WRIST, RIGHT OPEN CARPAL TUNNEL RELEASE  Attending Physician: Cathrine Muster, MD  Admission Date:(Not on file)      Assessment:      Patient Active Problem List   Diagnosis    Acute renal failure superimposed on stage 3 chronic kidney disease    Drug-induced hyperglycemia    Peripheral edema    CHF NYHA class III, acute, combined    Morbid obesity    Hypertension    Fibromyalgia    Diabetic neuropathy associated with type 2 diabetes mellitus    Type II diabetes mellitus, uncontrolled    Type 2 diabetes mellitus with diabetic chronic kidney disease          Past Medical History:   Diagnosis Date    Anemia     Arthritis     Asthma     Chronic kidney disease     Stage III    Congestive heart failure     Diabetes  mellitus     Fibromyalgia     Gastroesophageal reflux disease     Hyperlipidemia     Hypertension     Neuropathy     Pulmonary embolism 08/2019    Renal insufficiency     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     Scoliosis     Sleep apnea     no cpap    Type 2 diabetes mellitus, controlled     Vertigo             Past Surgical History:   Procedure Laterality Date    ABLATION ENDOMETRIAL  APPENDECTOMY (OPEN)      CESAREAN SECTION      x 2    CHOLECYSTECTOMY      EYE SURGERY Bilateral     cataract    HYSTERECTOMY  12/2019    UTERINE RUPTURE         Social History     Social History     Tobacco Use    Smoking status: Never    Smokeless tobacco: Never   Substance Use Topics    Alcohol use: Never      Allergies:     Allergies   Allergen Reactions    Bactrim [Sulfamethoxazole-Trimethoprim] Hives, Respiratory Distress and Edema    Glipizide Edema     Throat swelling    Lisinopril Edema     Tongue swelling     Hospital Medications:     Home Medications:     No medications prior to admission.     Coreg, hydralazine, albuterol, montelukast, eliquis, gabapentin, olmesartan, hydroxyzine      Vitals:   There were no vitals filed for this visit.    Labs:     Results     ** No results found for the last 24 hours. **               Lab Results   Component Value Date    HGB 8.7 (L) 10/05/2021    PLT 355 (H) 10/05/2021       Lab Results   Component Value Date    NA 137 10/05/2021    CL 108 10/05/2021    CO2 21 10/05/2021    K 4.5 10/05/2021    CREAT 1.6 (H) 10/05/2021    BUN 42.0 (H) 10/05/2021    GLU 209 (H) 10/05/2021       Lab Results   Component Value Date    AST 15 06/29/2021    ALT 13 06/29/2021    TROPI <0.01 10/05/2021       Lab Results   Component Value Date    PT 13.6 (H) 08/11/2020    INR 1.2 (H) 08/11/2020       Lab Results   Component Value Date    COVID Nasal Swab 10/05/2021    COVID Not Detected 10/05/2021       Rads:     Radiology Results (24 Hour)     ** No results found for the last 24 hours.  **        Narrative & Impression    NORMAL SINUS RHYTHM  LEFT VENTRICULAR HYPERTROPHY  NONSPECIFIC T WAVE ABNORMALITY  ABNORMAL ECG  WHEN COMPARED WITH ECG OF 29-Apr-2021 14:51,  NO SIGNIFICANT CHANGE WAS FOUND  Confirmed by SLOTTOW MD, TINA ([id number]) on 10/05/2021 9:03:40 PM       Seen & cleared by cardiology 12/03/2021    A1C 8.0 down from 11.0      Patient has been off of anticoagulation since last week as the surgery was originally scheduled for last Thursday and rescheduled due to administrative issues. Patient discussed with PCP who prescribes.     Patient denies any palpitations since regularly taking anxiolytic medications

## 2021-12-05 ENCOUNTER — Encounter
Admission: RE | Disposition: A | Discharge status: Home / Self Care | Payer: Self-pay | Source: Ambulatory Visit | Attending: Plastic Surgery

## 2021-12-05 ENCOUNTER — Ambulatory Visit: Payer: No Typology Code available for payment source | Admitting: Certified Registered"

## 2021-12-05 ENCOUNTER — Encounter: Payer: Self-pay | Admitting: Plastic Surgery

## 2021-12-05 ENCOUNTER — Ambulatory Visit
Admission: RE | Admit: 2021-12-05 | Discharge: 2021-12-05 | Disposition: A | Discharge status: Home / Self Care | Payer: No Typology Code available for payment source | Source: Ambulatory Visit | Attending: Plastic Surgery | Admitting: Plastic Surgery

## 2021-12-05 DIAGNOSIS — M7701 Medial epicondylitis, right elbow: Secondary | ICD-10-CM | POA: Insufficient documentation

## 2021-12-05 DIAGNOSIS — G5621 Lesion of ulnar nerve, right upper limb: Secondary | ICD-10-CM | POA: Insufficient documentation

## 2021-12-05 HISTORY — PX: REPAIR, UPPER EXTREMITY, TENDON & NERVE: SHX5338

## 2021-12-05 SURGERY — REPAIR, UPPER EXTREMITY, TENDON & NERVE
Anesthesia: Anesthesia General | Site: Arm Lower | Laterality: Right | Wound class: Clean

## 2021-12-05 MED ORDER — ROCURONIUM BROMIDE 50 MG/5ML IV SOLN
INTRAVENOUS | Status: DC | PRN
Start: 2021-12-05 — End: 2021-12-05
  Administered 2021-12-05: 10 mg via INTRAVENOUS

## 2021-12-05 MED ORDER — FENTANYL CITRATE (PF) 50 MCG/ML IJ SOLN (WRAP)
50.0000 ug | INTRAMUSCULAR | Status: DC | PRN
Start: 2021-12-05 — End: 2021-12-05

## 2021-12-05 MED ORDER — PROPOFOL INFUSION 10 MG/ML
INTRAVENOUS | Status: DC | PRN
Start: 2021-12-05 — End: 2021-12-05
  Administered 2021-12-05: 08:00:00 50 ug/kg/min via INTRAVENOUS

## 2021-12-05 MED ORDER — SUCCINYLCHOLINE CHLORIDE 20 MG/ML IJ SOLN
INTRAMUSCULAR | Status: DC | PRN
Start: 2021-12-05 — End: 2021-12-05
  Administered 2021-12-05: 140 mg via INTRAVENOUS

## 2021-12-05 MED ORDER — MIDAZOLAM HCL 1 MG/ML IJ SOLN (WRAP)
INTRAMUSCULAR | Status: AC
Start: 2021-12-05 — End: ?
  Filled 2021-12-05: qty 2

## 2021-12-05 MED ORDER — OXYCODONE HCL 5 MG PO TABS
5.0000 mg | ORAL_TABLET | Freq: Four times a day (QID) | ORAL | Status: DC | PRN
Start: 2021-12-05 — End: 2021-12-05

## 2021-12-05 MED ORDER — EPHEDRINE SULFATE 50 MG/ML IJ/IV SOLN (WRAP)
Status: DC | PRN
Start: 2021-12-05 — End: 2021-12-05
  Administered 2021-12-05 (×4): 10 mg via INTRAVENOUS

## 2021-12-05 MED ORDER — PROPOFOL INFUSION 10 MG/ML
INTRAVENOUS | Status: DC | PRN
Start: 2021-12-05 — End: 2021-12-05
  Administered 2021-12-05: 30 mg via INTRAVENOUS
  Administered 2021-12-05: 200 mg via INTRAVENOUS
  Administered 2021-12-05: 30 mg via INTRAVENOUS

## 2021-12-05 MED ORDER — CEFAZOLIN SODIUM-DEXTROSE 2-5 GM/50ML-% IV SOLN
2.0000 g | Freq: Once | INTRAVENOUS | Status: AC
Start: 2021-12-05 — End: 2021-12-05
  Administered 2021-12-05: 08:00:00 3 g via INTRAVENOUS

## 2021-12-05 MED ORDER — FAMOTIDINE 10 MG/ML IV SOLN (WRAP)
INTRAVENOUS | Status: AC
Start: 2021-12-05 — End: ?
  Filled 2021-12-05: qty 2

## 2021-12-05 MED ORDER — FENTANYL CITRATE (PF) 50 MCG/ML IJ SOLN (WRAP)
INTRAMUSCULAR | Status: AC
Start: 2021-12-05 — End: ?
  Filled 2021-12-05: qty 1

## 2021-12-05 MED ORDER — HYDROMORPHONE HCL 0.5 MG/0.5 ML IJ SOLN
0.5000 mg | INTRAMUSCULAR | Status: DC | PRN
Start: 2021-12-05 — End: 2021-12-05

## 2021-12-05 MED ORDER — OXYCODONE-ACETAMINOPHEN 5-325 MG PO TABS
1.0000 | ORAL_TABLET | Freq: Once | ORAL | Status: DC | PRN
Start: 2021-12-05 — End: 2021-12-05

## 2021-12-05 MED ORDER — GLYCOPYRROLATE 0.2 MG/ML IJ SOLN (WRAP)
INTRAMUSCULAR | Status: DC | PRN
Start: 2021-12-05 — End: 2021-12-05
  Administered 2021-12-05: .1 mg via INTRAVENOUS

## 2021-12-05 MED ORDER — SODIUM CHLORIDE 0.9 % IV SOLN
INTRAVENOUS | Status: DC
Start: 2021-12-05 — End: 2021-12-05

## 2021-12-05 MED ORDER — HYDROMORPHONE HCL 2 MG PO TABS
2.0000 mg | ORAL_TABLET | Freq: Once | ORAL | Status: DC | PRN
Start: 2021-12-05 — End: 2021-12-05

## 2021-12-05 MED ORDER — PHENYLEPHRINE 100 MCG/ML IV SYRINGE FOR INFUSION (ANESTHESIA)
PREFILLED_SYRINGE | INTRAVENOUS | Status: DC | PRN
Start: 2021-12-05 — End: 2021-12-05
  Administered 2021-12-05: 25 ug/min via INTRAVENOUS

## 2021-12-05 MED ORDER — OXYCODONE HCL 5 MG PO TABS
5.0000 mg | ORAL_TABLET | Freq: Four times a day (QID) | ORAL | 0 refills | Status: AC | PRN
Start: 2021-12-05 — End: 2021-12-12

## 2021-12-05 MED ORDER — CEFAZOLIN SODIUM 1 G IJ SOLR
INTRAMUSCULAR | Status: AC
Start: 2021-12-05 — End: ?
  Filled 2021-12-05: qty 2000

## 2021-12-05 MED ORDER — ONDANSETRON HCL 4 MG/2ML IJ SOLN
INTRAMUSCULAR | Status: DC | PRN
Start: 2021-12-05 — End: 2021-12-05
  Administered 2021-12-05: 4 mg via INTRAVENOUS

## 2021-12-05 MED ORDER — ONDANSETRON HCL 4 MG/2ML IJ SOLN
4.0000 mg | Freq: Once | INTRAMUSCULAR | Status: DC | PRN
Start: 2021-12-05 — End: 2021-12-05

## 2021-12-05 MED ORDER — LIDOCAINE HCL 2 % IJ SOLN
INTRAMUSCULAR | Status: DC | PRN
Start: 2021-12-05 — End: 2021-12-05
  Administered 2021-12-05: 100 mg via INTRAVENOUS
  Administered 2021-12-05: 80 mg via INTRAVENOUS

## 2021-12-05 MED ORDER — MIDAZOLAM HCL 1 MG/ML IJ SOLN (WRAP)
INTRAMUSCULAR | Status: DC | PRN
Start: 2021-12-05 — End: 2021-12-05
  Administered 2021-12-05: 2 mg via INTRAVENOUS

## 2021-12-05 MED ORDER — BUPIVACAINE HCL (PF) 0.5 % IJ SOLN
INTRAMUSCULAR | Status: AC
Start: 2021-12-05 — End: ?
  Filled 2021-12-05: qty 30

## 2021-12-05 MED ORDER — METOCLOPRAMIDE HCL 5 MG/ML IJ SOLN
10.0000 mg | Freq: Once | INTRAMUSCULAR | Status: DC | PRN
Start: 2021-12-05 — End: 2021-12-05

## 2021-12-05 MED ORDER — CEFAZOLIN SODIUM 1 G IJ SOLR
INTRAMUSCULAR | Status: AC
Start: 2021-12-05 — End: ?
  Filled 2021-12-05: qty 1000

## 2021-12-05 MED ORDER — BUPIVACAINE HCL 0.5 % IJ SOLN
INTRAMUSCULAR | Status: DC | PRN
Start: 2021-12-05 — End: 2021-12-05
  Administered 2021-12-05: 30 mL

## 2021-12-05 MED ORDER — FENTANYL CITRATE (PF) 50 MCG/ML IJ SOLN (WRAP)
INTRAMUSCULAR | Status: DC | PRN
Start: 2021-12-05 — End: 2021-12-05
  Administered 2021-12-05 (×2): 50 ug via INTRAVENOUS

## 2021-12-05 MED ORDER — PHENYLEPHRINE HCL 10 MG/ML IV SOLN (WRAP)
Status: DC | PRN
Start: 2021-12-05 — End: 2021-12-05
  Administered 2021-12-05 (×5): 50 ug via INTRAVENOUS

## 2021-12-05 SURGICAL SUPPLY — 91 items
APPLICATOR CHLORAPREP 26 ML 70% ISOPROPYL ALCOHOL 2% CHLORHEXIDINE (Applicator) ×1 IMPLANT
APPLICATOR PRP 70% ISPRP 2% CHG 26ML (Applicator) ×2
BANDAGE CMPR CFLX NL 5YDX4IN LF STRL (Procedure Accessories) ×2
BANDAGE CMPR ESMK 9FTX4IN LF STRL ELC (Procedure Accessories) ×1
BANDAGE CMPR PLSTR CTTN MED MTRX 5YDX6IN (Bandage) ×1
BANDAGE CMPR POR THN CBN 5YDX3IN LTX (Dressing) ×1
BANDAGE COBAN THIN COMPRESSION L5 YD X W3 IN SELF ADHERENT WRAP (Dressing) ×1 IMPLANT
BANDAGE COFLEX NL COMPRESSION L5 YD X W4 IN COHESIVE SOFT FOAM TAN (Procedure Accessories) IMPLANT
BANDAGE ELASTIC L5 YD X W6 IN W/SELF-CLOSURE HOOK (Bandage) ×1 IMPLANT
BANDAGE ESMARK COMPRESSION L9 FT X W4 IN (Procedure Accessories) ×1
BANDAGE ESMARK COMPRESSION L9 FT X W4 IN ELASTIC BLUE (Procedure Accessories) IMPLANT
BANDAGE MEDIUM ELASTIC MATRIX POLYESTER COTTON L5 YD X W6 IN (Bandage) IMPLANT
BANDAGE MEDLINE MEDIUM COMPRESSION L5 YD (Bandage) ×1
CORD ELECTROSURGICAL L12 FT BIPOLAR (Instrument) ×1
CORD ELECTROSURGICAL L12 FT BIPOLAR FORCEPS FOOTSWITCH VALLEYLAB (Instrument) ×1 IMPLANT
CORD ESURG VLAB 12FT LF STRL BP FCP (Instrument) ×1
DRAPE SRG STERI LG W/TWL 23X17IN STRL (Drape) ×2
DRAPE SRG STERI MINOR PRCDR 25X22IN LF STRL (Drape) ×1
DRAPE SRGCL TOWEL PLSTC ADHSV L23 IN X W17 IN STERI-DRAPE LG (Drape) ×2 IMPLANT
DRAPE SURGICAL ADHESIVE APERTURE FLUID (Drape) ×1
DRAPE SURGICAL ADHESIVE APERTURE FLUID CONTROL L25 IN X W22 IN (Drape) ×1 IMPLANT
DRAPE SURGICAL TOWEL PLASTIC ADHESIVE (Drape) ×2
DRESSING KERLIX AMD ROLL 4.1YD (Dressing) ×1
DRESSING PETRO 3% BI 3BRM GZE XR 8X1IN (Dressing) ×1
DRESSING PETROLATUM XEROFORM L8 IN X W1 (Dressing) ×1
DRESSING PETROLATUM XEROFORM L8 IN X W1 IN 3% BISMUTH TRIBROMOPHENATE (Dressing) ×2 IMPLANT
DRESSING WOUND KERLIX L4.125 YD X W4.5 (Dressing) ×1
DRESSING WOUND KERLIX L4.125 YD X W4.5 IN 6 PLY ANTIMICROBIAL (Dressing) IMPLANT
ELECTRODE ADULT PATIENT RETURN L9 FT REM POLYHESIVE ACRYLIC FOAM (Procedure Accessories) ×1 IMPLANT
ELECTRODE PATIENT RETURN L9 FT VALLEYLAB (Procedure Accessories) ×1
ELECTRODE PT RTN RM PHSV ACRL FM C30- LB (Procedure Accessories) ×1
GLOVE SRG 7 BGL SRG LTX STRL PF BEAD CUF (Glove) ×2
GLOVE SURGICAL 7 BIOGEL SURGEONS POWDER (Glove) ×2
GLOVE SURGICAL 7 BIOGEL SURGEONS POWDER FREE BEAD CUFF TEXTURE SURFACE (Glove) ×2 IMPLANT
GOWN SRG XL SMARTGOWN LF STRL LVL 4 (Gown) ×3
GOWN SURGICAL XL SMARTGOWN LEVEL 4 (Gown) ×3
GOWN SURGICAL XL SMARTGOWN LEVEL 4 BREATHABLE (Gown) ×1 IMPLANT
KIT RM TURNOVER LF NS DISP (Kits) ×2
KIT ROOM TURNOVER NONSTERILE LATEX FREE DISPOSABLE (Kits) ×1 IMPLANT
LOOP VESSEL MICRO BLUE 2PK (Procedure Accessories) IMPLANT
LOOP VESSEL MINI ELLIPTICAL L406 MM X (External Fixator) ×1
LOOP VESSEL MINI ELLIPTICAL L406 MM X W.8 MM RED 2 POUCH RADIOPAQUE (External Fixator) ×1 IMPLANT
LOOP VSL SIL MN ELIP STERION 406X.8MM LF (External Fixator) ×1
PADDING CAST L4 YD X W3 IN UNDERCAST (Cast) ×1
PADDING CAST L4 YD X W4 IN COHESION HAND TEARABLE SELF BOND SPECIALIST (Procedure Accessories) ×2 IMPLANT
PADDING CAST L4 YD X W6 IN UNDERCAST (Bandage) ×1
PADDING CAST L4 YD X W6 IN UNDERCAST HAND TEARABLE SPECIALIST 100 (Bandage) IMPLANT
PADDING CAST L4YD XW3IN UNDERCAST MILD STRETCH CHSV WEBRIL COTTON (Cast) IMPLANT
PADDING CST CTTN SPCLST 100 4YDX4IN LF (Procedure Accessories) ×4
PADDING CST CTTN SPCLST 100 4YDX6IN LF (Bandage) ×1
PADDING CST CTTN WBRL 4YDX3IN LF STRL (Cast) ×1
POSITIONER HEAD DONUT 9IN (Positioning Supplies) ×2
POSITIONER HEAD FOAM POSITIONING DONUT OD9 IN (Positioning Supplies) ×1 IMPLANT
RULER SRG SIL 1SQ MM MCGRD 30X15X.4MM (Procedure Accessories)
RULER SURGICAL L30 MM X W15 MM X H.4 MM (Procedure Accessories) IMPLANT
SLEEVE CMPR MED KN LGTH KDL SCD 21- IN (Sleeve) ×2
SLEEVE COMPRESSION MEDIUM KNEE LENGTH KENDALL SEQUENTIAL OD21- IN (Sleeve) ×1 IMPLANT
SOLUTION IRR 0.9% NACL 1000ML LF STRL (Irrigation Solutions) ×1
SOLUTION IRRIGATION 0.9% SODIUM CHLORIDE (Irrigation Solutions) ×1
SOLUTION IRRIGATION 0.9% SODIUM CHLORIDE 1000 ML PLASTIC POUR BOTTLE (Irrigation Solutions) ×1 IMPLANT
SPLINT ORTH PLSTR OF PARIS SPCLST 15X4IN (Cast) ×2
SPLINT ORTHOPEDIC 15X4IN FAST SET PLASTER OF PARIS SPECIALIST LF (Cast) IMPLANT
SPONGE GAUZE L4 IN X W4 IN 16 PLY (Dressing) ×1
SPONGE GAUZE L4 IN X W4 IN 16 PLY MAXIMUM ABSORBENT USP TYPE VII (Dressing) IMPLANT
SPONGE GZE CTTN CRTY 4X4IN LF NS 16 PLY (Dressing) ×1
STAPLER SKIN L3.9 MM X W6.9 MM WIDE 35 (Skin Closure) ×1
STAPLER SKIN L3.9 MM X W6.9 MM WIDE 35 COUNT FIX HEAD RATCHET (Skin Closure) ×1 IMPLANT
STAPLER SKN SS W PRX PX .58MM 3.9X6.9MM (Skin Closure) ×1
STOCKINETTE COTTON L4 YD X W6 IN BIAS (Dressing) ×1
STOCKINETTE COTTON L4 YD X W6 IN BIAS ABSORPTIVE ORTHOPEDIC (Dressing) IMPLANT
STOCKINETTE ORTH CTTN 4YDX6IN LF STRL (Dressing) ×1
STRIP SKIN CLOSURE L4 IN X W1/2 IN (Dressing) ×1
STRIP SKIN CLOSURE L4 IN X W1/2 IN REINFORCE STERI-STRIP POLYESTER (Dressing) IMPLANT
STRIP SKNCLS PLSTR STRSTRP 4X.5IN LF (Dressing) ×1
SUTURE ABS 3-0 PS2 MNCRL MTPS 27IN MFL (Suture) ×1
SUTURE ETHIBOND EXCEL GREEN 0 SH L30 IN (Suture)
SUTURE ETHIBOND EXCEL GREEN 0 SH L30 IN BRAID NONABSORBABLE (Suture) IMPLANT
SUTURE ETHILON BLACK 4-0 PS-2 L19 IN (Suture) ×1
SUTURE ETHILON BLACK 4-0 PS-2 L19 IN MONOFILAMENT NONABSORBABLE (Suture) ×1 IMPLANT
SUTURE MONOCRYL 3-0 PS-2 L27 IN (Suture) ×1
SUTURE MONOCRYL 3-0 PS-2 L27 IN MONOFILAMENT UNDYED ABSORBABLE (Suture) ×1 IMPLANT
SUTURE NABSB 0 SH EBND EXC 30IN BRD GRN (Suture)
SUTURE NABSB 4-0 PS2 ETH MTPS 19IN MFL (Suture) ×1
TRAY SRG HAND UPPER EXTREMITY ~~LOC~~ (Pack) ×1
TRAY SRG OR GENERAL IMVH (Pack) ×2
TRAY SURGICAL HAND UPPER EXTREMITY ~~LOC~~ (Pack) ×1 IMPLANT
TRAY SURGICAL OR (Pack) ×1 IMPLANT
WRAP CMPR POR CBN 5YDX4IN LF STRL SLFADH (Bandage) ×2
WRAP COMPRESSION L5 YD X W3 IN THIN SELF (Dressing) ×1
WRAP COMPRESSION L5 YD X W4 IN SELF (Bandage) ×2
WRAP COMPRESSION L5 YD X W4 IN SELF ADHERENT ELASTIC LIGHTWEIGHT HAND (Bandage) IMPLANT

## 2021-12-05 NOTE — Progress Notes (Signed)
Fall Precautions interventions include the following due to sedation/anesthesia for procedures:    -Physical environment is free of clutter  -The patient is oriented to the environment  -Family members may be at the bedside when appropriate  -Stretchers are in the lowest position with wheels locked  -Non-skid socks are provided as foot covers  -Staff members assist the patient with ambulation to the bathroom  -Call bell in reach if staff member is away

## 2021-12-05 NOTE — Op Note (Signed)
FULL OPERATIVE NOTE    Date Time: 12/05/21 9:02 AM  Patient Name: [name], [name] (MRN: [id number])  Attending Physician: Cathrine Muster, MD    Date of Operation:   12/05/2021    Providers Performing:   Surgeon(s) and Role:     * Cathrine Muster, MD - Primary    Surgical First Assistant(s):   First Assistant: Raylene Miyamoto    Operative Procedure:   REPAIR, RIGHT ULNAR NERVE AND NEUROPLASTY @ ELBOW AND WRIST, RIGHT OPEN CARPAL TUNNEL RELEASE: 64721 (CPT)  Neuroplasty ulnar nerve at right elbow  Medial epicondylectomy at right elbow  Neuroplasty ulnar nerve at right wrist (Guyon's canal)  Right open carpal tunnel release  Creation of custom short arm splint    Preoperative Diagnosis:   Pre-Op Diagnosis Codes:     * 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  Plan for incisions at his elbow as well as his volar wrist were agreed upon.  He was taken to the operating theater.  Timeout procedure was performed.  He was appropriately intubated.  The right upper extremity was fully extended on an arm board.  The arm was then prepped and draped to the level of the axilla.   A sterile tourniquet was placed proximally on the upper arm.  After exsanguination with an Esmarch, the tourniquet was inflated to 250 mmHg.  The elbow was then flexed and externally rotated to allow for access to the inferior medial portion.  A 15-blade scalpel was used to incise a curvilinear incision just distal and proximal to the medial epicondyle.  Sharp dissection through the subcutaneous tissues was performed until the ulnar nerve was  identified proximal to the elbow within the muscular fascia.  This was traced to the level of the medial epicondyle which was significantly inflamed.  There was clear compression of the ulnar nerve across this segment and release of the ligament provided significant improvement in immediate compression of the nerve.  Distal neurolysis was also performed for assurance of no fascial compression proximally more distal to the elbow.  Inspection of the medial epicondyle revealed a highly sharp and fragmented portion consistent with medial epicondylitis.  This was trimmed away with an osteotome for significant smoothing of the bony and cartilaginous portion.   The wound was copiously irrigated and the skin was closed with interrupted and running Monocryl suture.      Attention was then paid towards the volar wrist portion.  A curvilinear incision was made over the expected position of the hook of the hamate bone on the ulnar volar palm.  Sharp dissection through the subcutaneous tissues was performed with a 15-blade scalpel until the transverse carpal ligament was identified.  This was divided with a fresh blade scalpel under direct visualization.  Once entering the carpal tunnel, a Therapist, nutritional was used for retraction of the contents in a radial direction for protection of the nerve and flexor tendons.  Completion release of the entire transverse carpal ligament proximally and distally was  performed for exposure of the entire area.  Attention was then paid towards the ulnar portion.  The ulnar neurovascular bundle was identified distal to Guyon's canal and a retrograde neurolysis was performed of the ulnar nerve until the bifurcation between the sensory and motor component.  The sensory was completely released from all surrounding fibrinous structures.  The posterior aspect of the hypothenar eminence was also released and division of the posterior fascia with a tenotomy scissors was performed with significant reduction  and compression of the the motor fascicles.       This wound was copiously irrigated.  The skin was closed with interrupted 4-0 nylon suture in standard fashion.  Injection of 30 mL of 0.5% Marcaine without epinephrine was then performed for ulnar and median nerve block both proximal to the elbow and at the level of the wrist for reduction of complex regional pain syndrome flare.  Xeroform was placed on both incisions, circumferential Kerlix, circumferential Webril, and a short-arm forearm based fiberglass flexion blocking splint was created prior to wrapping with Coban and deflation of the tourniquet, which was at 38 minutes.  She tolerated this with no difficulties and was awakened and taken to recovery in stable condition with perfusion to all fingers.    Estimated Blood Loss:   * No values recorded between 12/05/2021  7:39 AM and 12/05/2021  8:55 AM *    Implants:   * No implants in log *    Drains:   Drains: no    Specimens:   * No specimens in log *    Complications:   None    Signed by: Cathrine Muster, MD  [facility]

## 2021-12-05 NOTE — Brief Op Note (Signed)
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]    Findings:   Highly compressed ulnar nerve at the elbow and inflamed ulnar and median nerves at the wrist    Anesthesia:   General    Estimated Blood Loss:    * No values recorded between 12/05/2021  7:39 AM and 12/05/2021  8:55 AM *    Implants:   * No implants in log *    Drains:   Drains: no    Intentionally Retained Foreign Objects/Wound Packing:   Intentionally Retained Foreign Objects     Active IRFO     None     Specimens:   * No specimens in log *       Complications:   None     Signed by: Cathrine Muster, MD                                                                           [facility]

## 2021-12-05 NOTE — Transfer of Care (Signed)
Anesthesia Transfer of Care Note    Patient: [name]    Procedures performed: Procedure(s):  REPAIR, RIGHT ULNAR NERVE AND NEUROPLASTY @ ELBOW AND WRIST, RIGHT OPEN CARPAL TUNNEL RELEASE    Anesthesia type: General ETT    Patient location:Phase I PACU    Last vitals:   Vitals:    12/05/21 0902   BP: 130/66   Pulse: 85   Resp: (!) 24   Temp:    SpO2: 100%       Post pain: Patient not complaining of pain, continue current therapy      Mental Status:awake    Respiratory Function: tolerating face mask    Cardiovascular: stable    Nausea/Vomiting: patient not complaining of nausea or vomiting    Hydration Status: adequate    Post assessment: no apparent anesthetic complications, no reportable events and no evidence of recall    Signed by: Laretta Alstrom, CRNA  12/05/21 9:02 AM

## 2021-12-05 NOTE — Interval H&P Note (Signed)
Patient seen and personally examined and no relevant changes to H&P on record

## 2021-12-05 NOTE — Discharge Instr - AVS First Page (Addendum)
Reason for your Hospital Admission:  Release of the nerves in your right arm      Instructions for after your discharge:  Continue the dressing at all times until your follow-up appointment next week  Do not get the dressing wet or soiled (cover with plastic bag in the shower)    Keep the hand elevated at all times to reduce pain and swelling.  Gravity is the reason swelling continues and the pain is often related to swelling.  This is important  You can use ICE (20 minutes of ice in a ziplock bag) over the dressing 4-6 times daily    Take Ibuprofen and Tylenol at alternating times according to the need, usually every 3 hours in addition to your stronger pain medications  Take the strong pain medications by prescription as needed only

## 2021-12-05 NOTE — Anesthesia Postprocedure Evaluation (Signed)
Anesthesia Post Evaluation    Patient: [name]    Procedure(s):  REPAIR, RIGHT ULNAR NERVE AND NEUROPLASTY @ ELBOW AND WRIST, RIGHT OPEN CARPAL TUNNEL RELEASE    Anesthesia type: general    Last Vitals:   Vitals Value Taken Time   BP 134/73 12/05/21 0915   Temp 36.8 C (98.3 F) 12/05/21 0915   Pulse 88 12/05/21 0915   Resp 18 12/05/21 0915   SpO2 96 % 12/05/21 0915                 Anesthesia Post Evaluation:     Patient Evaluated: PACU  Patient Participation: complete - patient participated  Level of Consciousness: awake and alert  Pain Score: 3  Pain Management: adequate    Airway Patency: patent    Anesthetic complications: No      PONV Status: none    Cardiovascular status: acceptable  Respiratory status: acceptable  Hydration status: acceptable  Comments: Patient seen and examined at bedside. All questions answered. Patient acceptable for discharge        Signed by: Freddrick March, DO, 12/05/2021 10:50 AM

## 2021-12-09 ENCOUNTER — Encounter: Payer: Self-pay | Admitting: Plastic Surgery

## 2021-12-17 ENCOUNTER — Emergency Department: Payer: No Typology Code available for payment source

## 2021-12-17 ENCOUNTER — Emergency Department
Admission: EM | Admit: 2021-12-17 | Discharge: 2021-12-18 | Disposition: A | Discharge status: Home / Self Care | Payer: No Typology Code available for payment source | Attending: Emergency Medicine | Admitting: Emergency Medicine

## 2021-12-17 DIAGNOSIS — R0989 Other specified symptoms and signs involving the circulatory and respiratory systems: Secondary | ICD-10-CM

## 2021-12-17 DIAGNOSIS — Z9889 Other specified postprocedural states: Secondary | ICD-10-CM | POA: Insufficient documentation

## 2021-12-17 DIAGNOSIS — Z5189 Encounter for other specified aftercare: Secondary | ICD-10-CM | POA: Insufficient documentation

## 2021-12-17 DIAGNOSIS — M79601 Pain in right arm: Secondary | ICD-10-CM | POA: Insufficient documentation

## 2021-12-17 DIAGNOSIS — Z4889 Encounter for other specified surgical aftercare: Secondary | ICD-10-CM

## 2021-12-17 LAB — COMPREHENSIVE METABOLIC PANEL
ALT: 8 U/L (ref 0–55)
AST (SGOT): 14 U/L (ref 5–41)
Albumin/Globulin Ratio: 0.8 — ABNORMAL LOW (ref 0.9–2.2)
Albumin: 3 g/dL — ABNORMAL LOW (ref 3.5–5.0)
Alkaline Phosphatase: 91 U/L (ref 37–117)
Anion Gap: 10 (ref 5.0–15.0)
BUN: 41 mg/dL — ABNORMAL HIGH (ref 7.0–21.0)
Bilirubin, Total: 0.2 mg/dL (ref 0.2–1.2)
CO2: 21 mEq/L (ref 17–29)
Calcium: 8.9 mg/dL (ref 8.5–10.5)
Chloride: 106 mEq/L (ref 99–111)
Creatinine: 1.6 mg/dL — ABNORMAL HIGH (ref 0.4–1.0)
Globulin: 3.6 g/dL (ref 2.0–3.6)
Glucose: 171 mg/dL — ABNORMAL HIGH (ref 70–100)
Potassium: 5.2 mEq/L (ref 3.5–5.3)
Protein, Total: 6.6 g/dL (ref 6.0–8.3)
Sodium: 137 mEq/L (ref 135–145)

## 2021-12-17 LAB — CBC AND DIFFERENTIAL
Absolute NRBC: 0 10*3/uL (ref 0.00–0.00)
Basophils Absolute Automated: 0.08 10*3/uL (ref 0.00–0.08)
Basophils Automated: 0.5 %
Eosinophils Absolute Automated: 0.36 10*3/uL (ref 0.00–0.44)
Eosinophils Automated: 2.3 %
Hematocrit: 27.6 % — ABNORMAL LOW (ref 34.7–43.7)
Hgb: 8.7 g/dL — ABNORMAL LOW (ref 11.4–14.8)
Immature Granulocytes Absolute: 0.1 10*3/uL — ABNORMAL HIGH (ref 0.00–0.07)
Immature Granulocytes: 0.6 %
Instrument Absolute Neutrophil Count: 9.83 10*3/uL — ABNORMAL HIGH (ref 1.10–6.33)
Lymphocytes Absolute Automated: 4.51 10*3/uL — ABNORMAL HIGH (ref 0.42–3.22)
Lymphocytes Automated: 28.5 %
MCH: 26.8 pg (ref 25.1–33.5)
MCHC: 31.5 g/dL (ref 31.5–35.8)
MCV: 84.9 fL (ref 78.0–96.0)
MPV: 9.9 fL (ref 8.9–12.5)
Monocytes Absolute Automated: 0.97 10*3/uL — ABNORMAL HIGH (ref 0.21–0.85)
Monocytes: 6.1 %
Neutrophils Absolute: 9.83 10*3/uL — ABNORMAL HIGH (ref 1.10–6.33)
Neutrophils: 62 %
Nucleated RBC: 0 /100 WBC (ref 0.0–0.0)
Platelets: 413 10*3/uL — ABNORMAL HIGH (ref 142–346)
RBC: 3.25 10*6/uL — ABNORMAL LOW (ref 3.90–5.10)
RDW: 13 % (ref 11–15)
WBC: 15.85 10*3/uL — ABNORMAL HIGH (ref 3.10–9.50)

## 2021-12-17 LAB — GFR: EGFR: 35.3

## 2021-12-17 LAB — LACTIC ACID, PLASMA: Lactic Acid: 1.1 mmol/L (ref 0.2–2.0)

## 2021-12-17 MED ORDER — CEPHALEXIN 500 MG PO CAPS
500.0000 mg | ORAL_CAPSULE | Freq: Once | ORAL | Status: AC
Start: 2021-12-17 — End: 2021-12-18
  Administered 2021-12-18: 500 mg via ORAL
  Filled 2021-12-17: qty 1

## 2021-12-17 MED ORDER — OXYCODONE-ACETAMINOPHEN 5-325 MG PO TABS
1.0000 | ORAL_TABLET | Freq: Once | ORAL | Status: AC
Start: 2021-12-17 — End: 2021-12-17
  Administered 2021-12-17: 1 via ORAL
  Filled 2021-12-17: qty 1

## 2021-12-17 MED ORDER — KETOROLAC TROMETHAMINE 30 MG/ML IJ SOLN
30.0000 mg | Freq: Once | INTRAMUSCULAR | Status: AC
Start: 2021-12-17 — End: 2021-12-17
  Administered 2021-12-17: 30 mg via INTRAVENOUS
  Filled 2021-12-17: qty 1

## 2021-12-17 NOTE — ED Provider Notes (Signed)
EMERGENCY DEPARTMENT HISTORY AND PHYSICAL EXAM        Date: 12/17/2021  Patient Name: [name]  Attending Physician: Loman Brooklyn, DO   Advanced Practice Provider: Shanda Bumps PA-C    History of Presenting Illness       History Provided By: Patient      Additional History: [name] is a 41 y.o. female with PMH DVT, DM, HTN, CHF, POD #12 for repair right ulnar nerve and neuroplasty at elbow and wrist for carpal tunnel syndrome presents today with right wrist and right elbow surgery presenting to the emergency department with c/o right wrist pain, swelling and redness.    PCP: Lynn Sjogren, NP  SPECIALISTS:    No current facility-administered medications for this encounter.     Current Outpatient Medications   Medication Sig Dispense Refill    albuterol sulfate HFA (PROVENTIL) 108 (90 Base) MCG/ACT inhaler Inhale 2 puffs into the lungs every 4 (four) hours as needed for Wheezing 1 each 0    Alcohol Swabs Pads Use as indicated for insulin injections 120 each 3    apixaban (ELIQUIS) 5 MG Take 5 mg by mouth every 12 (twelve) hours      ARIPiprazole (ABILIFY) 5 MG tablet Take 10 mg by mouth daily      aspirin 81 MG chewable tablet Chew 81 mg by mouth daily      atorvastatin (LIPITOR) 40 MG tablet Take 80 mg by mouth nightly      Azelastine-Fluticasone 137-50 MCG/ACT Suspension 1 spray by Nasal route 2 (two) times daily      baclofen (LIORESAL) 10 MG tablet Take 10 mg by mouth as needed      bumetanide (BUMEX) 2 MG tablet Take 1 tablet (2 mg total) by mouth 2 (two) times daily 60 tablet 0    carvedilol (COREG) 25 MG tablet Take 25 mg by mouth 2 (two) times daily      cephalexin (KEFLEX) 500 MG capsule Take 1 capsule (500 mg) by mouth 4 (four) times daily for 7 days 28 capsule 0    cetirizine (ZyrTEC) 5 MG tablet Take 5 mg by mouth daily      diclofenac (VOLTAREN) 50 MG EC tablet Take 50 mg by mouth daily      docusate sodium (COLACE) 100 MG capsule Take 100 mg by mouth nightly      Dulera  200-5 MCG/ACT Aerosol Inhale 2 puffs into the lungs daily      DULoxetine (CYMBALTA) 60 MG capsule Take 60 mg by mouth daily      EPINEPHrine 0.15 MG/0.3ML injection Inject 0.15 mg into the muscle as needed      folic acid (FOLVITE) 1 MG tablet Take 1 mg by mouth daily      gabapentin (NEURONTIN) 800 MG tablet Take 800 mg by mouth 3 (three) times daily      hydrALAZINE (APRESOLINE) 50 MG tablet Take 50 mg by mouth 2 (two) times daily      hydrOXYzine (ATARAX) 10 MG tablet Take 10 mg by mouth every 6 (six) hours as needed for Anxiety      insulin detemir (Levemir) 100 UNIT/ML injection Inject 50 Units into the skin every 12 (twelve) hours (Patient taking differently: Inject 53 Units into the skin every 12 (twelve) hours) 30 mL 1    insulin lispro (HumaLOG) 100 UNIT/ML injection Inject 25 Units into the skin 3 (three) times daily before meals (Patient taking differently: Inject 25 Units into the skin  3 (three) times daily before meals) 30 mL 3    Insulin Syringe-Needle U-100 27G X 1/2" 1 ML Misc Inject 1 Dose into the skin 4 (four) times daily 500 each 3    lidocaine (LIDODERM) 5 % Place 1 patch onto the skin every 24 hours Remove & Discard patch within 12 hours or as directed by MD 15 patch 0    meclizine (ANTIVERT) 25 MG tablet Take 25 mg by mouth as needed      metOLazone (ZAROXOLYN) 10 MG tablet Take 10 mg by mouth daily      montelukast (SINGULAIR) 10 MG tablet Take 10 mg by mouth nightly      olmesartan (BENICAR) 40 MG tablet Take 40 mg by mouth daily      omeprazole (PriLOSEC) 40 MG capsule Take 40 mg by mouth nightly      oxyCODONE-acetaminophen (PERCOCET) 5-325 MG per tablet Take 1 tablet by mouth every 4 (four) hours as needed for Pain 12 tablet 0    Semaglutide,0.25 or 0.5MG /DOS, (Ozempic, 0.25 or 0.5 MG/DOSE,) 2 MG/1.5ML Solution Pen-injector Inject into the skin      topiramate (TOPAMAX) 100 MG tablet Take 100 mg by mouth daily      traZODone (DESYREL) 150 MG tablet Take 150 mg by mouth      Trulicity 1.5  MG/0.5ML Solution Pen-injector Inject 1.5 mg into the skin Once each week on Monday      valACYclovir (VALTREX) 1000 MG tablet Take 1 g by mouth as needed      Vilazodone HCl 40 MG Tab Take 1 tablet by mouth daily         Past History     Past Medical History:  Past Medical History:   Diagnosis Date    Anemia     Arthritis     Asthma     Chronic kidney disease     Stage III    Congestive heart failure     Diabetes mellitus     Fibromyalgia     Gastroesophageal reflux disease     Hyperlipidemia     Hypertension     Neuropathy     Pulmonary embolism 08/2019    Renal insufficiency     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     Scoliosis     Sleep apnea     no cpap    Type 2 diabetes mellitus, controlled     Vertigo        Past Surgical History:  Past Surgical History:   Procedure Laterality Date    ABLATION ENDOMETRIAL      APPENDECTOMY (OPEN)      CESAREAN SECTION      x 2    CHOLECYSTECTOMY      EYE SURGERY Bilateral     cataract    HYSTERECTOMY  12/2019    REPAIR, UPPER EXTREMITY, TENDON & NERVE Right 12/05/2021    Procedure: REPAIR, RIGHT ULNAR NERVE AND NEUROPLASTY @ ELBOW AND WRIST, RIGHT OPEN CARPAL TUNNEL RELEASE;  Surgeon: Cathrine Muster, MD;  Location: [facility];  Service: Plastics;  Laterality: Right;    UTERINE RUPTURE         Family History:  Family History   Problem Relation Age of Onset    Heart disease Mother     Hypertension Mother     Diabetes Mother     Asthma Father     Hypertension Father     Diabetes Daughter     Asthma  Son     Cancer Maternal Aunt     Cancer Maternal Grandmother        Social History:  Social History     Socioeconomic History    Marital status: Single     Spouse name: None    Number of children: None    Years of education: None    Highest education level: None   Occupational History    None   Tobacco Use    Smoking status: Never    Smokeless tobacco: Never   Vaping Use    Vaping Use: Never used   Substance and Sexual Activity    Alcohol use: Never    Drug use: Never    Sexual  activity: None   Other Topics Concern    None   Social History Narrative    ** Merged History Encounter **          Social Determinants of Health     Financial Resource Strain: Not on file   Food Insecurity: Not on file   Transportation Needs: Not on file   Physical Activity: Not on file   Stress: Not on file   Social Connections: Not on file   Intimate Partner Violence: Not on file   Housing Stability: Not on file       Allergies:  Allergies   Allergen Reactions    Bactrim [Sulfamethoxazole-Trimethoprim] Hives, Respiratory Distress and Edema    Glipizide Edema     Throat swelling    Lisinopril Edema     Tongue swelling       Review of Systems     Review of Systems  Please see HPI for review of systems    Physical Exam     Vitals:    12/17/21 1903 12/17/21 2234 12/18/21 0024   BP: (!) 189/100 (!) 184/105 (!) 169/111   Pulse: 94 95 85   Resp: 18 18    Temp: 98.6 F (37 C) 98.8 F (37.1 C) 98.2 F (36.8 C)   TempSrc: Oral Oral Oral   SpO2: 100% 100% 100%   Weight: 135.4 kg     Height: 5\' 6"  (1.676 m)         Physical Exam    CONSTITUTIONAL: Well-developed, well-nourished. Alert, cooperative and in no acute distress. Vital signs reviewed.   HEAD: Normocephalic.  HENT: Atraumatic to external nose and ears, oral mucosa moist. Normal nares without rhinorrhea.  EYES: Conjunctiva clear. No icterus. No discharge or tearing.   NECK: Supple, non tender, trachea midline  RESPIRATORY: Normal work of breathing, no respiratory distress.  CARDIAC: Regular rate. 2+ distal pulses bilaterally  GI: soft, nttp  MS: No bony tenderness. Full ROM grossly intact. No CT  SKIN: Warm and dry.    NEURO: Alert, awake, and appropriate. Normal speech. Motor normal. No focal deficits.  PSYCH: appropriate mood and affect. alert and oriented to person, place and time  Diagnostic Study Results     Labs -     Results       Procedure Component Value Units Date/Time    Comprehensive metabolic panel [[id number]]  (Abnormal) Collected: 12/17/21 2217     Specimen: Blood Updated: 12/17/21 2255     Glucose 171 mg/dL      BUN 40.9 mg/dL      Creatinine 1.6 mg/dL      Sodium 811 mEq/L      Potassium 5.2 mEq/L      Chloride 106 mEq/L      CO2  21 mEq/L      Calcium 8.9 mg/dL      Protein, Total 6.6 g/dL      Albumin 3.0 g/dL      AST (SGOT) 14 U/L      ALT 8 U/L      Alkaline Phosphatase 91 U/L      Bilirubin, Total 0.2 mg/dL      Globulin 3.6 g/dL      Albumin/Globulin Ratio 0.8     Anion Gap 10.0    GFR [[id number]] Collected: 12/17/21 2217     Updated: 12/17/21 2255     EGFR 35.3       Lactic Acid [[id number]] Collected: 12/17/21 2217    Specimen: Blood Updated: 12/17/21 2232     Lactic Acid 1.1 mmol/L     CBC and differential [[id number]]  (Abnormal) Collected: 12/17/21 2217    Specimen: Blood Updated: 12/17/21 2230     WBC 15.85 x10 3/uL      Hgb 8.7 g/dL      Hematocrit 29.5 %      Platelets 413 x10 3/uL      RBC 3.25 x10 6/uL      MCV 84.9 fL      MCH 26.8 pg      MCHC 31.5 g/dL      RDW 13 %      MPV 9.9 fL      Instrument Absolute Neutrophil Count 9.83 x10 3/uL      Neutrophils 62.0 %      Lymphocytes Automated 28.5 %      Monocytes 6.1 %      Eosinophils Automated 2.3 %      Basophils Automated 0.5 %      Immature Granulocytes 0.6 %      Nucleated RBC 0.0 /100 WBC      Neutrophils Absolute 9.83 x10 3/uL      Lymphocytes Absolute Automated 4.51 x10 3/uL      Monocytes Absolute Automated 0.97 x10 3/uL      Eosinophils Absolute Automated 0.36 x10 3/uL      Basophils Absolute Automated 0.08 x10 3/uL      Immature Granulocytes Absolute 0.10 x10 3/uL      Absolute NRBC 0.00 x10 3/uL     Culture Blood Aerobic and Anaerobic [[id number]] Collected: 12/17/21 2217    Specimen: Arm from Blood, Venipuncture Updated: 12/17/21 2217    Narrative:      1 BLUE+1 PURPLE    Culture Blood Aerobic and Anaerobic [[id number]] Collected: 12/17/21 2217    Specimen: Arm from Blood, Venipuncture Updated: 12/17/21 2217    Narrative:      1 BLUE+1 PURPLE            Radiologic Studies -    Radiology Results (24 Hour)       Procedure Component Value Units Date/Time    [place] VenoDopp Upper Extremity Right [[id number]] Resulted: 12/17/21 2257    Order Status: Sent Updated: 12/17/21 2322    Hand Right PA Lateral and Oblique [[id number]] Collected: 12/17/21 1941    Order Status: Completed Updated: 12/17/21 1944    Narrative:      HISTORY: Cellulitis status post procedure    TECHNIQUE: Three views     FINDINGS:  There is no radiographic evidence for acute bony, soft tissue  or joint space abnormality.      Impression:       Negative    Lorenda Peck, MD   12/17/2021 7:41 PM        .  Medical Decision Making     I reviewed the vital signs, available nursing notes, past medical history, past surgical history, family history and social history.    Vital Signs-Reviewed the patient's vital signs.   Patient Vitals for the past 12 hrs:   BP Temp Pulse Resp   12/18/21 0024 (!) 169/111 98.2 F (36.8 C) 85 --   12/17/21 2234 (!) 184/105 98.8 F (37.1 C) 95 18   12/17/21 1903 (!) 189/100 98.6 F (37 C) 94 18       Pulse oximetry analysis - Normal SpO2: SpO2: 100 % on RA    Personal Protective Equipment:  I was wearing the following personal protective equipment at the time of patient evaluation and for all face-to-face interactions:   Regular surgical mask, face shield, surgical cap, and gloves.     Pandemic Caveat:  The patient was seen and evaluated during the time of COVID pandemic.  Significant limitations can be present in the evaluation and management of emergency department patients during pandemic conditions, including but not limited to lack of testing, rapidly changing IHS protocols, and/or limited follow-up resources.       Procedures:   Procedures    Old Medical Records: Old medical records, Nursing notes.       ED Course:   ED Course as of 12/18/21 0035   Tue Dec 17, 2021   2303 WBC(!): 15.85 [RS]   2303 Creatinine(!): 1.6  baseline [RS]   2352 Patient and findings d/w Dr Georgeanne Nim. Swelling is within  reason, recommends elevation, oral kelfex and f/u in clinic this morning.  [SS]   Wed Dec 18, 2021   0034 Patient states she has not yet taken her evening dose of antihypertensive.  States will dressing if she gets home. [SS]      ED Course User Index  [RS] Shahin, Rawan, DO  [SS] Dannie Woolen, Burlene Arnt, [place]       Medical Decision Making  Provider Notes:   41 y.o. female with PMH DVT/PE, DM, HTN, CH, MRSA, POD #12 s/p repair right ulnar nerve and neuroplasty at elbow and wrist for carpal tunnel syndrome presents c/o right wrist pain, redness and swelling x 3 days. Patient states pain radiates up to forearm.  Denies any pain in the elbow, elbow incisional pain/redness/swelling. No CP, no SOB, palpitations. No signs of abscess/cellulitis. Leukocytosis 15, BUN/CR 41/1.6 which seems to be her baseline, glucose 171, lactic acid negative.  Right wrist x-ray negative.  RUE venous doppler negative for DVT. Patient's plastic surgeon Dr. Benna Dunks consulted, recommendation is for Keflex and will see patient in his office in the morning. Strict return precautions provided. All questions answered.Findings/imaging results d/w patient. Patient counseled on diagnosis, treatment plan, f/u plans, and signs and symptoms when to return to ED. Pt is stable and ready for discharge, VSS. No further work up/treatment indicated at this time.    Problems Addressed:  Encounter for post surgical wound check: acute illness or injury  Right arm pain: acute illness or injury  Suspected DVT (deep vein thrombosis): acute illness or injury    Amount and/or Complexity of Data Reviewed  Labs:  Decision-making details documented in ED Course.  ECG/medicine tests: ordered.  Discussion of management or test interpretation with external provider(s): Plastics/ hand specialist Dr Georgeanne Nim    Risk  Prescription drug management.    D/w Loman Brooklyn, DO    Discharge Prescriptions       Medication Sig Dispense Auth. Provider    cephalexin (  KEFLEX) 500 MG capsule Take  1 capsule (500 mg) by mouth 4 (four) times daily for 7 days 28 capsule Luccia Reinheimer M, PA    oxyCODONE-acetaminophen (PERCOCET) 5-325 MG per tablet Take 1 tablet by mouth every 4 (four) hours as needed for Pain 12 tablet Xayden Linsey M, PA            Diagnosis     Clinical Impression:   1. Right arm pain    2. Suspected DVT (deep vein thrombosis)    3. Encounter for post surgical wound check        Treatment Plan:   ED Disposition       ED Disposition   Discharge    Condition   --    Date/Time   Wed Dec 18, 2021 12:14 AM    Comment   [name] discharge to home/self care.    Condition at disposition: Stable                   _______________________________    CHART OWNERSHIP: I, Shanda Bumps, PA-C, am the primary clinician of record.  _______________________________     Margarite Gouge, PA  12/18/21 0037       Loman Brooklyn, DO  12/18/21 5643

## 2021-12-17 NOTE — ED Triage Notes (Signed)
[name] is a 41 y.o. female who presents to ED with c/o worsening pain and swelling to right wrist worse over the past 2 days. Pt had ulner nerve surgery with Dr Georgeanne Nim on 1/26. Pt denies any recent fall or injury, no pain meds taken PTA. Denies fevers, no streaking or drainage to surgical incision.     BP (!) 189/100   Pulse 94   Temp 98.6 F (37 C) (Oral)   Resp 18   Ht 5\' 6"  (1.676 m)   Wt 135.4 kg   SpO2 100%   BMI 48.18 kg/m

## 2021-12-17 NOTE — EDIE (Signed)
COLLECTIVE?NOTIFICATION?12/17/2021 18:41?[name], [name]?MRN: [id number]    Criteria Met      5 ED Visits in 12 Months    Security and Safety  No Security Events were found.  ED Care Guidelines  There are currently no ED Care Guidelines for this patient. Please check your facility's medical records system.        Prescription Monitoring Program  130??- Narcotic Use Score  070??- Sedative Use Score  000??- Stimulant Use Score  240??- Overdose Risk Score  - All Scores range from 000-999 with 75% of the population scoring < 200 and on 1% scoring above 650  - The last digit of the narcotic, sedative, and stimulant score indicates the number of active prescriptions of that type  - Higher Use scores correlate with increased prescribers, pharmacies, mg equiv, and overlapping prescriptions  - Higher Overdose Risk Scores correlate with increased risk of unintentional overdose death   Concerning or unexpectedly high scores should prompt a review of the PMP record; this does not constitute checking PMP for prescribing purposes.    E.D. Visit Count (12 mo.)  Facility Visits   [facility] 1   [place] [facility] 5   [place] [facility] 1   Total 7   Note: Visits indicate total known visits.     Recent Emergency Department Visit Summary  Date Facility [facility] Type Diagnoses or Chief Complaint    Dec 17, 2021  [place] [facility]  [place]  [place]  Emergency      wound check post surgery      Oct 05, 2021  [place] [facility]  [place]  [place]  Emergency      triage      triage - cp, sob, edema      Shortness of Breath      Chest Pain      Viral infection, unspecified      Disorder of kidney and ureter, unspecified      Essential (primary) hypertension      Oct 01, 2021  [place] [facility]  [place]  [place]  Emergency      Pinched Nerve Right side/Stiff      Arm Injury      Unspecified fall, initial encounter      Strain of unspecified muscle, fascia and tendon at wrist and hand level, right hand, initial encounter       Lesion of ulnar nerve, right upper limb      Aug 16, 2021  [facility]  [place]  [place]  Urgent Care      Localized edema      Other forms of dyspnea      Jun 29, 2021  [place] [facility]  [place]  [place]  Emergency      body aches, vomiting, diarrhea      Generalized Body Aches      Diarrhea      Emesis      Dizziness      Headache      COVID-19      Disorder of kidney and ureter, unspecified      Anemia, unspecified      Apr 29, 2021  [place] [facility]  [place]  [place]  Emergency      Syncope      Syncope and collapse      Dizziness and giddiness      Abnormal finding of blood chemistry, unspecified  Other specified abnormal findings of blood chemistry      Apr 24, 2021  [facility] [place] [place]  [place]  Emergency      1. Syncope and collapse      2. 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 Heart failure, unspecified      4. Type 1 diabetes mellitus with diabetic chronic kidney disease      5. Long term (current) use of insulin      6. Long term (current) use of anticoagulants      7. Contact with and (suspected) exposure to COVID-19      8. Personal history of pulmonary embolism      9. Other specified postprocedural states      10. Allergy status to other antibiotic agents      Mar 18, 2021  [place] [facility]  [place]  [place]  Emergency      sob;asthma      Shortness of Breath      Cough      Disorder of kidney and ureter, unspecified      Unspecified asthma with (acute) exacerbation        Recent Inpatient Visit Summary  No Recent Inpatient Visits were found.  Care Team  Provider Specialty Phone Fax Service Dates   [facility] Clinic/Center: [address] [facility]) [phone] Current    Ricard Dillon , MD Internal Medicine [phone] Current       Collective Portal  This patient has registered at the [facility] Emergency Department   For more information visit: [email or web address]     PLEASE NOTE:     1.   Any care recommendations and other clinical information are provided as guidelines or for historical purposes only, and providers should exercise their own clinical judgment when providing care.    2.   You may only use this information for purposes of treatment, payment or health care operations activities, and subject to the limitations of applicable Collective Policies.    3.   You should consult directly with the organization that provided a care guideline or other clinical history with any questions about additional information or accuracy or completeness of information provided.    ? 2023 [place], [employer] - [email or web address]

## 2021-12-18 MED ORDER — CEPHALEXIN 500 MG PO CAPS
500.0000 mg | ORAL_CAPSULE | Freq: Four times a day (QID) | ORAL | 0 refills | Status: AC
Start: 2021-12-18 — End: 2021-12-25

## 2021-12-18 MED ORDER — OXYCODONE-ACETAMINOPHEN 5-325 MG PO TABS
1.0000 | ORAL_TABLET | ORAL | 0 refills | Status: AC | PRN
Start: 2021-12-18 — End: 2021-12-25

## 2022-02-02 ENCOUNTER — Emergency Department: Payer: BC Managed Care – PPO

## 2022-02-02 ENCOUNTER — Inpatient Hospital Stay
Admission: EM | Admit: 2022-02-02 | Discharge: 2022-02-06 | DRG: 682 | Discharge status: Against Medical Advice | Payer: BC Managed Care – PPO | Attending: Internal Medicine | Admitting: Internal Medicine

## 2022-02-02 DIAGNOSIS — E871 Hypo-osmolality and hyponatremia: Secondary | ICD-10-CM | POA: Diagnosis present

## 2022-02-02 DIAGNOSIS — Z833 Family history of diabetes mellitus: Secondary | ICD-10-CM

## 2022-02-02 DIAGNOSIS — Z90710 Acquired absence of both cervix and uterus: Secondary | ICD-10-CM

## 2022-02-02 DIAGNOSIS — E785 Hyperlipidemia, unspecified: Secondary | ICD-10-CM | POA: Diagnosis present

## 2022-02-02 DIAGNOSIS — I82412 Acute embolism and thrombosis of left femoral vein: Secondary | ICD-10-CM | POA: Diagnosis present

## 2022-02-02 DIAGNOSIS — J45909 Unspecified asthma, uncomplicated: Secondary | ICD-10-CM | POA: Diagnosis present

## 2022-02-02 DIAGNOSIS — N183 Chronic kidney disease, stage 3 unspecified: Secondary | ICD-10-CM | POA: Diagnosis present

## 2022-02-02 DIAGNOSIS — E611 Iron deficiency: Secondary | ICD-10-CM | POA: Diagnosis present

## 2022-02-02 DIAGNOSIS — I5043 Acute on chronic combined systolic (congestive) and diastolic (congestive) heart failure: Secondary | ICD-10-CM | POA: Diagnosis present

## 2022-02-02 DIAGNOSIS — G4733 Obstructive sleep apnea (adult) (pediatric): Secondary | ICD-10-CM | POA: Diagnosis present

## 2022-02-02 DIAGNOSIS — D72829 Elevated white blood cell count, unspecified: Secondary | ICD-10-CM | POA: Diagnosis present

## 2022-02-02 DIAGNOSIS — M797 Fibromyalgia: Secondary | ICD-10-CM | POA: Diagnosis present

## 2022-02-02 DIAGNOSIS — Z7951 Long term (current) use of inhaled steroids: Secondary | ICD-10-CM

## 2022-02-02 DIAGNOSIS — E11319 Type 2 diabetes mellitus with unspecified diabetic retinopathy without macular edema: Secondary | ICD-10-CM | POA: Diagnosis present

## 2022-02-02 DIAGNOSIS — Z6841 Body Mass Index (BMI) 40.0 and over, adult: Secondary | ICD-10-CM

## 2022-02-02 DIAGNOSIS — R079 Chest pain, unspecified: Secondary | ICD-10-CM

## 2022-02-02 DIAGNOSIS — Z794 Long term (current) use of insulin: Secondary | ICD-10-CM

## 2022-02-02 DIAGNOSIS — E11649 Type 2 diabetes mellitus with hypoglycemia without coma: Secondary | ICD-10-CM | POA: Diagnosis present

## 2022-02-02 DIAGNOSIS — Z86711 Personal history of pulmonary embolism: Secondary | ICD-10-CM

## 2022-02-02 DIAGNOSIS — H6592 Unspecified nonsuppurative otitis media, left ear: Secondary | ICD-10-CM | POA: Diagnosis present

## 2022-02-02 DIAGNOSIS — F419 Anxiety disorder, unspecified: Secondary | ICD-10-CM | POA: Diagnosis present

## 2022-02-02 DIAGNOSIS — I130 Hypertensive heart and chronic kidney disease with heart failure and stage 1 through stage 4 chronic kidney disease, or unspecified chronic kidney disease: Secondary | ICD-10-CM | POA: Diagnosis present

## 2022-02-02 DIAGNOSIS — Z20822 Contact with and (suspected) exposure to covid-19: Secondary | ICD-10-CM | POA: Diagnosis present

## 2022-02-02 DIAGNOSIS — Z79899 Other long term (current) drug therapy: Secondary | ICD-10-CM

## 2022-02-02 DIAGNOSIS — E1122 Type 2 diabetes mellitus with diabetic chronic kidney disease: Secondary | ICD-10-CM | POA: Diagnosis present

## 2022-02-02 DIAGNOSIS — D631 Anemia in chronic kidney disease: Secondary | ICD-10-CM | POA: Diagnosis present

## 2022-02-02 DIAGNOSIS — N2581 Secondary hyperparathyroidism of renal origin: Secondary | ICD-10-CM | POA: Diagnosis present

## 2022-02-02 DIAGNOSIS — N179 Acute kidney failure, unspecified: Principal | ICD-10-CM | POA: Diagnosis present

## 2022-02-02 DIAGNOSIS — Z8249 Family history of ischemic heart disease and other diseases of the circulatory system: Secondary | ICD-10-CM

## 2022-02-02 DIAGNOSIS — Z7901 Long term (current) use of anticoagulants: Secondary | ICD-10-CM

## 2022-02-02 DIAGNOSIS — I248 Other forms of acute ischemic heart disease: Secondary | ICD-10-CM | POA: Diagnosis present

## 2022-02-02 DIAGNOSIS — Z5329 Procedure and treatment not carried out because of patient's decision for other reasons: Secondary | ICD-10-CM | POA: Diagnosis present

## 2022-02-02 DIAGNOSIS — E1165 Type 2 diabetes mellitus with hyperglycemia: Secondary | ICD-10-CM | POA: Diagnosis present

## 2022-02-02 DIAGNOSIS — E1140 Type 2 diabetes mellitus with diabetic neuropathy, unspecified: Secondary | ICD-10-CM | POA: Diagnosis present

## 2022-02-02 DIAGNOSIS — E8809 Other disorders of plasma-protein metabolism, not elsewhere classified: Secondary | ICD-10-CM | POA: Diagnosis present

## 2022-02-02 DIAGNOSIS — F32A Depression, unspecified: Secondary | ICD-10-CM | POA: Diagnosis present

## 2022-02-02 DIAGNOSIS — Z9114 Patient's other noncompliance with medication regimen: Secondary | ICD-10-CM

## 2022-02-02 DIAGNOSIS — Z7982 Long term (current) use of aspirin: Secondary | ICD-10-CM

## 2022-02-02 LAB — CBC AND DIFFERENTIAL
Absolute NRBC: 0 10*3/uL (ref 0.00–0.00)
Basophils Absolute Automated: 0.09 10*3/uL — ABNORMAL HIGH (ref 0.00–0.08)
Basophils Automated: 0.6 %
Eosinophils Absolute Automated: 0.31 10*3/uL (ref 0.00–0.44)
Eosinophils Automated: 2.1 %
Hematocrit: 28.2 % — ABNORMAL LOW (ref 34.7–43.7)
Hgb: 9 g/dL — ABNORMAL LOW (ref 11.4–14.8)
Immature Granulocytes Absolute: 0.07 10*3/uL (ref 0.00–0.07)
Immature Granulocytes: 0.5 %
Instrument Absolute Neutrophil Count: 9.06 10*3/uL — ABNORMAL HIGH (ref 1.10–6.33)
Lymphocytes Absolute Automated: 4.46 10*3/uL — ABNORMAL HIGH (ref 0.42–3.22)
Lymphocytes Automated: 29.8 %
MCH: 26.9 pg (ref 25.1–33.5)
MCHC: 31.9 g/dL (ref 31.5–35.8)
MCV: 84.2 fL (ref 78.0–96.0)
MPV: 10.7 fL (ref 8.9–12.5)
Monocytes Absolute Automated: 0.99 10*3/uL — ABNORMAL HIGH (ref 0.21–0.85)
Monocytes: 6.6 %
Neutrophils Absolute: 9.06 10*3/uL — ABNORMAL HIGH (ref 1.10–6.33)
Neutrophils: 60.4 %
Nucleated RBC: 0 /100 WBC (ref 0.0–0.0)
Platelets: 356 10*3/uL — ABNORMAL HIGH (ref 142–346)
RBC: 3.35 10*6/uL — ABNORMAL LOW (ref 3.90–5.10)
RDW: 12 % (ref 11–15)
WBC: 14.98 10*3/uL — ABNORMAL HIGH (ref 3.10–9.50)

## 2022-02-02 LAB — COMPREHENSIVE METABOLIC PANEL
ALT: 9 U/L (ref 0–55)
AST (SGOT): 9 U/L (ref 5–41)
Albumin/Globulin Ratio: 0.6 — ABNORMAL LOW (ref 0.9–2.2)
Albumin: 2.1 g/dL — ABNORMAL LOW (ref 3.5–5.0)
Alkaline Phosphatase: 113 U/L (ref 37–117)
Anion Gap: 11 (ref 5.0–15.0)
BUN: 44 mg/dL — ABNORMAL HIGH (ref 7.0–21.0)
Bilirubin, Total: 0.1 mg/dL — ABNORMAL LOW (ref 0.2–1.2)
CO2: 20 mEq/L (ref 17–29)
Calcium: 8 mg/dL — ABNORMAL LOW (ref 8.5–10.5)
Chloride: 103 mEq/L (ref 99–111)
Creatinine: 2.8 mg/dL — ABNORMAL HIGH (ref 0.4–1.0)
Globulin: 3.4 g/dL (ref 2.0–3.6)
Glucose: 375 mg/dL — ABNORMAL HIGH (ref 70–100)
Potassium: 4.7 mEq/L (ref 3.5–5.3)
Protein, Total: 5.5 g/dL — ABNORMAL LOW (ref 6.0–8.3)
Sodium: 134 mEq/L — ABNORMAL LOW (ref 135–145)

## 2022-02-02 LAB — PROBNP: NT-proBNP: 572 pg/mL — ABNORMAL HIGH (ref 0–125)

## 2022-02-02 LAB — HIGH SENSITIVITY TROPONIN-I: hs Troponin-I: 30.9 ng/L — AB

## 2022-02-02 LAB — GFR: EGFR: 18.5

## 2022-02-02 MED ORDER — APIXABAN 5 MG PO TABS
10.0000 mg | ORAL_TABLET | Freq: Once | ORAL | Status: AC
Start: 2022-02-02 — End: 2022-02-03
  Administered 2022-02-03: 10 mg via ORAL
  Filled 2022-02-02: qty 2

## 2022-02-02 NOTE — EDIE (Signed)
COLLECTIVE?NOTIFICATION?02/02/2022 20:52?[name], [name]?MRN: [id number]    Criteria Met      5 ED Visits in 12 Months    Security and Safety  No Security Events were found.  ED Care Guidelines  There are currently no ED Care Guidelines for this patient. Please check your facility's medical records system.        Prescription Monitoring Program  160??- Narcotic Use Score  090??- Sedative Use Score  000??- Stimulant Use Score  230??- Overdose Risk Score  - All Scores range from 000-999 with 75% of the population scoring < 200 and on 1% scoring above 650  - The last digit of the narcotic, sedative, and stimulant score indicates the number of active prescriptions of that type  - Higher Use scores correlate with increased prescribers, pharmacies, mg equiv, and overlapping prescriptions  - Higher Overdose Risk Scores correlate with increased risk of unintentional overdose death   Concerning or unexpectedly high scores should prompt a review of the PMP record; this does not constitute checking PMP for prescribing purposes.    E.D. Visit Count (12 mo.)  Facility Visits   [facility]   [place] [facility] 5   [place] [facility] 1   [place] [facility] 1   Total 8   Note: Visits indicate total known visits.     Recent Emergency Department Visit Summary  Date Facility [facility] Type Diagnoses or Chief Complaint    Feb 02, 2022  [facility]  [place]  [place]  Emergency      Neck, back , Flank pain; Fever; leg pain; Hx of Blood clot      Dec 17, 2021  [place] [facility] [place] [facility]  [place]  [place]  Emergency      wound check post surgery      Post-op Problem      Wrist Pain      Pain in right arm      Other specified symptoms and signs involving the circulatory and respiratory systems      Encounter for other specified surgical aftercare      Oct 05, 2021  [place] [facility]  [place]  [place]  Emergency      triage      triage - cp, sob, edema      Shortness of Breath      Chest Pain      Viral infection,  unspecified      Disorder of kidney and ureter, unspecified      Essential (primary) hypertension      Oct 01, 2021  [place] [facility] [place] [facility]  [place]  [place]  Emergency      Pinched Nerve Right side/Stiff      Arm Injury      Unspecified fall, initial encounter      Strain of unspecified muscle, fascia and tendon at wrist and hand level, right hand, initial encounter      Lesion of ulnar nerve, right upper limb      Aug 16, 2021  [facility]  [place]  [place]  [facility]gent Care      Localized edema      Other forms of dyspnea      Jun 29, 2021   [facility] [place] [facility]  [place]    Emergency      body aches, vomiting, diarrhea      Generalized Body Aches      Diarrhea      Emesis  Dizziness      Headache      COVID-19      Disorder of kidney and ureter, unspecified      Anemia, unspecified      Apr 29, 2021  [place] [facility] [place] [facility]  [place]  [place]  Emergency      Syncope      Syncope and collapse      Dizziness and giddiness      Abnormal finding of blood chemistry, unspecified      Other specified abnormal findings of blood chemistry      Apr 24, [facility]  [place]  [place]  Emergency      1. Syncope and collapse      2. 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 Heart failure, unspecified      4. Type 1 diabetes mellitus with diabetic chronic kidney disease      5. Long term (current) use of insulin      6. Long term (current) use of anticoagulants      7. Contact with and (suspected) exposure to COVID-19      8. Personal history of pulmonary embolism      9. Other specified postprocedural states      10. Allergy status to other antibiotic agents      Mar 18, 2021  [place] [facility] [place] [facility]  [place]  [place]  Emergency      sob;asthma      Shortness of Breath      Cough      Disorder of kidney and ureter, unspecified      Unspecified asthma with (acute) exacerbation        Recent Inpatient Visit  Summary  No Recent Inpatient Visits were found.  Care Team  Provider Specialty Phone Fax Service Dates   [facility] Clinic[facility]: [facility] [phone] Current    Ricard DillonHAN, JANET , MD Internal Medicine [phone] Current      Collective Portal  This patient has registered at the [facility] Emergency Department   For more information visit: [email or web address]     PLEASE NOTE:     1.   Any care recommendations and other clinical information are provided as guidelines or for historical purposes only, and providers should exercise their own clinical judgment when providing care.    2.   You may only use this information for purposes of treatment, payment or health care operations activities, and subject to the limitations of applicable Collective Policies.    3.   You should consult directly with the organization that provided a care guideline or other clinical history with any questions about additional information or accuracy or completeness of information provided.    ? 2023 [employer] - [email or web address]

## 2022-02-02 NOTE — ED Provider Notes (Signed)
EMERGENCY DEPARTMENT HISTORY AND PHYSICAL EXAM      Patient Information     Patient Name: [name], [name]  Encounter Date:  02/02/2022  Patient DOB:  03/30/1981  MRN:  [id number]  Room:  24/B24  Rendering Provider: Louie Casa, MD    History of Presenting Illness     Chief Complaint:   Chief Complaint   Patient presents with    Otalgia    Back Pain    Knee Pain       HPI Comments:   41 y.o. female for evaluation of multiple complaints.  The patient reports that she has been feeling somewhat unwell for the past week.  She states that she has had fluctuating lower extremity edema.  She reports a history of CHF and renal insufficiency.  She takes Bumex, as well as several antihypertensives.  She states her blood pressures been very poorly controlled this week.  She denies any new shortness of breath or chest discomfort.  She states she has developed some left-sided ear pain, and is concerned for concurrent ear infection.  She states she had a posterior headache rating into her neck as well, but no pain with neck movement.  Patient also reports that she had hand surgery in January, she discontinued her Eliquis, which she was previously taking for DVT/PE, was never told to restart the Eliquis was been off this as well.  She does describe some posterior left knee pain, similar to when she had blood clots in the past.      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     Scoliosis     Sleep apnea     no cpap    Type 2 diabetes mellitus, controlled     Vertigo        Past Surgical History     Past Surgical History:   Procedure Laterality Date    ABLATION  ENDOMETRIAL      APPENDECTOMY (OPEN)      CESAREAN SECTION      x 2    CHOLECYSTECTOMY      EYE SURGERY Bilateral     cataract    HYSTERECTOMY  12/2019    REPAIR, UPPER EXTREMITY, TENDON & NERVE Right 12/05/2021    Procedure: REPAIR, RIGHT ULNAR NERVE AND NEUROPLASTY @ ELBOW AND WRIST, RIGHT OPEN CARPAL TUNNEL RELEASE;  Surgeon: Cathrine Muster, MD;  Location: [facility];  Service: Plastics;  Laterality: Right;    UTERINE RUPTURE         Family History     Family History   Problem Relation Age of Onset    Heart disease Mother     Hypertension Mother     Diabetes Mother     Asthma Father     Hypertension Father     Diabetes Daughter     Asthma Son     Cancer Maternal Aunt  Cancer Maternal Grandmother        Social History     Social History     Socioeconomic History    Marital status: Single     Spouse name: Not on file    Number of children: Not on file    Years of education: Not on file    Highest education level: Not on file   Occupational History    Not on file   Tobacco Use    Smoking status: Never    Smokeless tobacco: Never   Vaping Use    Vaping status: Never Used   Substance and Sexual Activity    Alcohol use: Never    Drug use: Never    Sexual activity: Not on file   Other Topics Concern    Not on file   Social History Narrative    ** Merged History Encounter **          Social Determinants of Health     Financial Resource Strain: Not on file   Food Insecurity: Not on file   Transportation Needs: Not on file   Physical Activity: Not on file   Stress: Not on file   Social Connections: Not on file   Intimate Partner Violence: Not on file   Housing Stability: Not on file       Allergies     Allergies   Allergen Reactions    Bactrim [Sulfamethoxazole-Trimethoprim] Hives, Respiratory Distress and Edema    Glipizide Edema     Throat swelling    Lisinopril Edema     Tongue swelling       Home Medications     Prior to Admission medications    Medication Sig Start Date End Date Taking? Authorizing Provider    albuterol sulfate HFA (PROVENTIL) 108 (90 Base) MCG/ACT inhaler Inhale 2 puffs into the lungs every 4 (four) hours as needed for Wheezing 03/18/21   Maurice SmallFarrell, Jackelyn R, PA   Alcohol Swabs Pads Use as indicated for insulin injections 08/13/20   Anola GurneyMorales, Mario A, MD   apixaban (ELIQUIS) 5 MG Take 5 mg by mouth every 12 (twelve) hours    [provider]   ARIPiprazole (ABILIFY) 5 MG tablet Take 10 mg by mouth daily 09/24/21   [provider]   aspirin 81 MG chewable tablet Chew 81 mg by mouth daily 12/25/20   [provider]   atorvastatin (LIPITOR) 40 MG tablet Take 80 mg by mouth nightly 09/05/21   [provider]   Azelastine-Fluticasone 137-50 MCG/ACT Suspension 1 spray by Nasal route 2 (two) times daily 05/17/21   [provider]   baclofen (LIORESAL) 10 MG tablet Take 10 mg by mouth as needed    [provider]   bumetanide (BUMEX) 2 MG tablet Take 1 tablet (2 mg total) by mouth 2 (two) times daily 08/21/20   Pilar PlateSkeiky, Kadijah, NP   carvedilol (COREG) 25 MG tablet Take 25 mg by mouth 2 (two) times daily    [provider]   cetirizine (ZyrTEC) 5 MG tablet Take 5 mg by mouth daily 05/20/21 05/20/22  [provider]   diclofenac (VOLTAREN) 50 MG EC tablet Take 50 mg by mouth daily 09/05/21   [provider]   docusate sodium (COLACE) 100 MG capsule Take 100 mg by mouth nightly 09/05/21   [provider]   Elwin Sleightulera 200-5 MCG/ACT Aerosol Inhale 2 puffs into the lungs daily 09/05/21   [provider]   DULoxetine (CYMBALTA)  60 MG capsule Take 60 mg by mouth daily 09/24/21   [provider]   EPINEPHrine 0.15 MG/0.3ML injection Inject 0.15 mg into the muscle as needed 09/05/21   [provider]   folic acid (FOLVITE) 1 MG tablet Take 1 mg by mouth daily    [provider]   gabapentin (NEURONTIN) 800 MG tablet Take 800 mg by mouth 3 (three) times daily 09/10/21   [provider]   hydrALAZINE  (APRESOLINE) 50 MG tablet Take 50 mg by mouth 2 (two) times daily    [provider]   hydrOXYzine (ATARAX) 10 MG tablet Take 10 mg by mouth every 6 (six) hours as needed for Anxiety    [provider]   insulin detemir (Levemir) 100 UNIT/ML injection Inject 50 Units into the skin every 12 (twelve) hours  Patient taking differently: Inject 53 Units into the skin every 12 (twelve) hours 08/13/20   Anola Gurney, MD   insulin lispro (HumaLOG) 100 UNIT/ML injection Inject 25 Units into the skin 3 (three) times daily before meals  Patient taking differently: Inject 25 Units into the skin 3 (three) times daily before meals 08/13/20   Anola Gurney, MD   Insulin Syringe-Needle U-100 27G X 1/2" 1 ML Misc Inject 1 Dose into the skin 4 (four) times daily 08/13/20   Anola Gurney, MD   lidocaine (LIDODERM) 5 % Place 1 patch onto the skin every 24 hours Remove & Discard patch within 12 hours or as directed by MD 04/29/21   Maryella Shivers, MD   meclizine (ANTIVERT) 25 MG tablet Take 25 mg by mouth as needed    [provider]   metOLazone (ZAROXOLYN) 10 MG tablet Take 10 mg by mouth daily 12/25/20   [provider]   montelukast (SINGULAIR) 10 MG tablet Take 10 mg by mouth nightly    [provider]   olmesartan (BENICAR) 40 MG tablet Take 40 mg by mouth daily    [provider]   omeprazole (PriLOSEC) 40 MG capsule Take 40 mg by mouth nightly    [provider]   Semaglutide,0.25 or 0.5MG /DOS, (Ozempic, 0.25 or 0.5 MG/DOSE,) 2 MG/1.5ML Solution Pen-injector Inject into the skin    [provider]   topiramate (TOPAMAX) 100 MG tablet Take 100 mg by mouth daily    [provider]   traZODone (DESYREL) 150 MG tablet Take 150 mg by mouth    [provider]   Trulicity 1.5 MG/0.5ML Solution Pen-injector Inject 1.5 mg into the skin Once each week on Monday 09/06/21   [provider]   valACYclovir (VALTREX) 1000 MG tablet Take 1 g  by mouth as needed    [provider]   Vilazodone HCl 40 MG Tab Take 1 tablet by mouth daily    [provider]         Review of Systems     Review of systems:    Constitutional: No fever or chills  Eye: No visual disturbances, no eye pain  HEENT: No nasal congestion, no sore throat.  Left ear pain  Respiratory: No cough, no shortness of breath, no sputum production  Cardiovascular: No chest pain, no palpitations  Gastrointestinal: No abdominal pain, no nausea or vomiting  Genitourinary: no dysuria, no hematuria  Hematologic: No bleeding or bruising tendency  Immunologic: No malaise, no swollen glands  Musculoskeletal: No back pain, lower extremity swelling and left posterior knee pain  Integumentary: No  rash, no abrasions  Neurologic: Posterior headache, no focal numbness or weakness    Physical Exam     Patient Vitals for the past 24 hrs:   BP Temp Temp src Pulse Resp SpO2 Weight   02/03/22 0030 187/81 98.6 F (37 C) Oral 86 21 100 % --   02/02/22 2351 -- -- -- 88 17 100 % --   02/02/22 2305 186/84 -- -- 92 21 -- --   02/02/22 2151 (!) 193/99 -- -- 95 -- 100 % --   02/02/22 2112 (!) 196/103 99 F (37.2 C) Oral (!) 102 18 99 % --   02/02/22 2109 -- -- -- -- -- -- 135.4 kg       General: Alert and oriented, no acute distress  Eye: Normal conjunctiva, pupils equal and reactive  HEENT: Oral mucosa is moist.  Left TM with mild mount of fluid posteriorly, no erythema or bulging  Respiratory: Lungs are clear to auscultation, respirations are nonlabored  Cardiovascular: Normal rate, regular rhythm  Gastrointestinal: Soft, nontender, nondistended  Musculoskeletal: Mild pitting edema to bilateral lower extremities, posterior popliteal fossa tenderness to palpation  Integumentary: Warm, dry  Neurologic: Alert, oriented, no focal deficits.  Psychiatric: Cooperative.    Orders Placed During This Encounter     Orders Placed This Encounter   Procedures    COVID-19 (SARS-CoV-2) only ([facility]) asymptomatic  admission    XR Chest  AP Portable    [place] VenoDopp Low Extremity Left    CBC and differential    Comprehensive metabolic panel    High Sensitivity Troponin-I at 0 hrs    High Sensitivity Troponin-I at 2 hrs with calculated Delta    NT-proBNP    GFR    ECG 12 lead    Adult Admit to Observation       ED Medications Administered     ED Medication Orders (From admission, onward)      Start Ordered     Status Ordering Provider    02/02/22 2358 02/02/22 2357  apixaban (ELIQUIS) tablet 10 mg  Once in ED        Route: Oral  Ordered Dose: 10 mg     Last MAR action: Given Jariah Tarkowski, [facility] M            Diagnostic Study Results and Data Review     The results of the diagnostic studies below were reviewed by the ED provider:    Labs  Results       Procedure Component Value Units Date/Time    COVID-19 (SARS-CoV-2) only ([facility]) asymptomatic admission [[id number]] Collected: 02/03/22 0027    Specimen: Nasopharyngeal Updated: 02/03/22 0031     Purpose of COVID testing Screening     SARS-CoV-2 Specimen Source Nasal Swab    Narrative:      o Collect and clearly label specimen type:  o PREFERRED-Upper respiratory specimen: One Nasal Swab in  Transport Media.  o Hand deliver to laboratory ASAP  Indication for testing->Extended care facility admission to  semi private room  Screening    NT-proBNP [[id number]]  (Abnormal) Collected: 02/02/22 2306     Updated: 02/02/22 2340     NT-proBNP 572 pg/mL     High Sensitivity Troponin-I at 0 hrs [[id number]]  (Abnormal) Collected: 02/02/22 2306    Specimen: Blood Updated: 02/02/22 2334     hs Troponin-I 30.9 ng/L     Comprehensive metabolic panel [[id number]]  (Abnormal) Collected: 02/02/22 2306    Specimen: Blood Updated: 02/02/22 2328  Glucose 375 mg/dL      BUN 16.1 mg/dL      Creatinine 2.8 mg/dL      Sodium 096 mEq/L      Potassium 4.7 mEq/L      Chloride 103 mEq/L      CO2 20 mEq/L      Calcium 8.0 mg/dL      Protein, Total 5.5 g/dL      Albumin 2.1 g/dL      AST (SGOT) 9 U/L      ALT 9  U/L      Alkaline Phosphatase 113 U/L      Bilirubin, Total 0.1 mg/dL      Globulin 3.4 g/dL      Albumin/Globulin Ratio 0.6     Anion Gap 11.0    GFR [[id number]] Collected: 02/02/22 2306     Updated: 02/02/22 2328     EGFR 18.5       CBC and differential [[id number]]  (Abnormal) Collected: 02/02/22 2306    Specimen: Blood Updated: 02/02/22 2314     WBC 14.98 x10 3/uL      Hgb 9.0 g/dL      Hematocrit 21.3 %      Platelets 356 x10 3/uL      RBC 3.35 x10 6/uL      MCV 84.2 fL      MCH 26.9 pg      MCHC 31.9 g/dL      RDW 12 %      MPV 10.7 fL      Instrument Absolute Neutrophil Count 9.06 x10 3/uL      Neutrophils 60.4 %      Lymphocytes Automated 29.8 %      Monocytes 6.6 %      Eosinophils Automated 2.1 %      Basophils Automated 0.6 %      Immature Granulocytes 0.5 %      Nucleated RBC 0.0 /100 WBC      Neutrophils Absolute 9.06 x10 3/uL      Lymphocytes Absolute Automated 4.46 x10 3/uL      Monocytes Absolute Automated 0.99 x10 3/uL      Eosinophils Absolute Automated 0.31 x10 3/uL      Basophils Absolute Automated 0.09 x10 3/uL      Immature Granulocytes Absolute 0.07 x10 3/uL      Absolute NRBC 0.00 x10 3/uL             Radiologic Studies  Radiology Results (24 Hour)       Procedure Component Value Units Date/Time    [place] VenoDopp Low Extremity Left [[id number]] Collected: 02/02/22 2322    Order Status: Completed Updated: 02/02/22 2332    Narrative:      HISTORY: Left lower extremity edema and pain.    COMPARISON: None available.    TECHNIQUE: Wallace Cullens scale, color flow, and spectral Doppler waveform  analysis was performed on the LEFT lower extremity veins described  below. There is normal compressibility, phasic flow, and response to  augmentation unless otherwise noted.    FINDINGS: Limited exam secondary to patient body habitus.    LEFT LOWER EXTREMITY:  Common femoral vein: Normal  Deep femoral vein (proximal portion): Normal  Greater saphenous vein at the saphenofemoral junction: Normal    Femoral vein  (proximal): Normal  Femoral vein (mid): Partially compressible, nonocclusive thrombus.  Femoral vein (distal): Partially compressible, nonocclusive thrombus.    Popliteal vein: Normal    Peroneal veins: Normal  Posterior tibial veins:  Normal    Soft tissues: No Baker's cyst identified.    The right common femoral vein (imaged per protocol) is patent.        Impression:           1. Nonocclusive deep venous thrombosis of the LEFT lower extremity mid  and distal superficial femoral vein.    Critical results were discussed with Dr. Lorene Dy by Dr. Jackey Loge at 11:20  PM on 02/02/2022.    Judd Gaudier, MD  02/02/2022 11:30 PM    XR Chest  AP Portable [[id number]] Collected: 02/02/22 2314    Order Status: Completed Updated: 02/02/22 2317    Narrative:      HISTORY:  Edema    COMPARISON: 08/05/2021    FINDINGS: Cardiac silhouette is borderline enlarged. Pulmonary  vasculature is within normal limits. No consolidation or pleural  effusion is identified. Hila are unremarkable.      Impression:       Negative for acute process         Gerlene Burdock, MD  02/02/2022 11:15 PM              Monitors, EKG, Critical Care     EKG:  Per my interpretation:  Date and time: 02/02/2022 2202  Rate: Normal  Rhythm: Normal sinus  Axis: Normal  Intervals: Normal  ST-T wave changes: No evidence of ST segment elevation, nonspecific ST abnormality with inverted T wave aVL, not changed from prior  Prior for comparison: 10/05/2021  Monitor:    Procedures         MDM and Clinical Notes     Nursing records reviewed and agree: YES    Clinical Notes & Decision Making:     This is a 41 year old female presents for evaluation of headache, blurred vision, fluctuating lower extremity edema, and left lower extremity pain.  On exam here she is afebrile, initially quite hypertensive with systolic blood pressure greater than 200.  Blood pressure did improve without additional intervention, remainder vital signs are unremarkable.  Differential diagnosis includes  hypertensive emergency versus urgency, pulmonary edema, kidney dysfunction, heart strain, ACS.  EKG without acute ischemic changes.  CBC shows a chronic leukocytosis.  CMP with creatinine of 2.8, baseline appears to be 1.6.  Normal electrolytes.  Troponin and BNP are slightly elevated as well.  Chest x-ray without edema.  Doppler ultrasound of the left lower extremity shows a nonocclusive DVT, and patient will be restarted on her previous Eliquis.  Given worsening renal function in the setting of uncontrolled hypertension, recommended admission and patient is agreeable.  Accepted to the hospitalist service.    Prescriptions       New Prescriptions    No medications on file       Diagnosis and Disposition     Clinical Impression:  1. AKI (acute kidney injury)      Final diagnoses:   AKI (acute kidney injury)       Disposition:  ED Disposition       ED Disposition   Observation    Condition   --    Date/Time   Mon Feb 03, 2022 12:11 AM    Comment   Admitting Physician: Hope Budds ADEL [[id number]]   Service:: Medicine [[id number]]   Estimated Length of Stay: < 2 midnights   Tentative Discharge Plan?: Home or Self Care [1]   Does patient need telemetry?: Yes   Is patient 18 yrs or greater?: Yes   Telemetry type (separate Telemetry order is also  required):: Adult telemetry                       Rendering Provider: Louie Casa, MD         Donalynn Furlong, MD  02/03/22 [phone]

## 2022-02-03 DIAGNOSIS — N183 Chronic kidney disease, stage 3 unspecified: Secondary | ICD-10-CM

## 2022-02-03 DIAGNOSIS — N179 Acute kidney failure, unspecified: Secondary | ICD-10-CM | POA: Diagnosis present

## 2022-02-03 DIAGNOSIS — E1149 Type 2 diabetes mellitus with other diabetic neurological complication: Secondary | ICD-10-CM

## 2022-02-03 DIAGNOSIS — I5041 Acute combined systolic (congestive) and diastolic (congestive) heart failure: Secondary | ICD-10-CM

## 2022-02-03 DIAGNOSIS — E8770 Fluid overload, unspecified: Secondary | ICD-10-CM

## 2022-02-03 LAB — CBC
Absolute NRBC: 0 10*3/uL (ref 0.00–0.00)
Hematocrit: 26.6 % — ABNORMAL LOW (ref 34.7–43.7)
Hgb: 8.4 g/dL — ABNORMAL LOW (ref 11.4–14.8)
MCH: 26.7 pg (ref 25.1–33.5)
MCHC: 31.6 g/dL (ref 31.5–35.8)
MCV: 84.4 fL (ref 78.0–96.0)
MPV: 10.9 fL (ref 8.9–12.5)
Nucleated RBC: 0 /100 WBC (ref 0.0–0.0)
Platelets: 336 10*3/uL (ref 142–346)
RBC: 3.15 10*6/uL — ABNORMAL LOW (ref 3.90–5.10)
RDW: 12 % (ref 11–15)
WBC: 13.35 10*3/uL — ABNORMAL HIGH (ref 3.10–9.50)

## 2022-02-03 LAB — IRON PROFILE
Iron Saturation: 18 % (ref 15–50)
Iron: 28 ug/dL — ABNORMAL LOW (ref 40–145)
TIBC: 154 ug/dL — ABNORMAL LOW (ref 265–497)
UIBC: 126 ug/dL (ref 126–382)

## 2022-02-03 LAB — VITAMIN B12: Vitamin B-12: 554 pg/mL (ref 211–911)

## 2022-02-03 LAB — MAGNESIUM
Magnesium: 1.5 mg/dL — ABNORMAL LOW (ref 1.6–2.6)
Magnesium: 1.8 mg/dL (ref 1.6–2.6)

## 2022-02-03 LAB — COMPREHENSIVE METABOLIC PANEL
ALT: 8 U/L (ref 0–55)
AST (SGOT): 7 U/L (ref 5–41)
Albumin/Globulin Ratio: 0.6 — ABNORMAL LOW (ref 0.9–2.2)
Albumin: 1.9 g/dL — ABNORMAL LOW (ref 3.5–5.0)
Alkaline Phosphatase: 96 U/L (ref 37–117)
Anion Gap: 9 (ref 5.0–15.0)
BUN: 46 mg/dL — ABNORMAL HIGH (ref 7.0–21.0)
Bilirubin, Total: 0.1 mg/dL — ABNORMAL LOW (ref 0.2–1.2)
CO2: 18 mEq/L (ref 17–29)
Calcium: 7.8 mg/dL — ABNORMAL LOW (ref 8.5–10.5)
Chloride: 105 mEq/L (ref 99–111)
Creatinine: 2.5 mg/dL — ABNORMAL HIGH (ref 0.4–1.0)
Globulin: 3 g/dL (ref 2.0–3.6)
Glucose: 427 mg/dL — ABNORMAL HIGH (ref 70–100)
Potassium: 4.5 mEq/L (ref 3.5–5.3)
Protein, Total: 4.9 g/dL — ABNORMAL LOW (ref 6.0–8.3)
Sodium: 132 mEq/L — ABNORMAL LOW (ref 135–145)

## 2022-02-03 LAB — ECG 12-LEAD
Atrial Rate: 91 {beats}/min
IHS MUSE NARRATIVE AND IMPRESSION: NORMAL
P Axis: 61 degrees
P-R Interval: 150 ms
Q-T Interval: 382 ms
QRS Duration: 78 ms
QTC Calculation (Bezet): 469 ms
R Axis: 11 degrees
T Axis: 88 degrees
Ventricular Rate: 91 {beats}/min

## 2022-02-03 LAB — COVID-19 (SARS-COV-2): SARS CoV 2 Overall Result: NOT DETECTED

## 2022-02-03 LAB — BASIC METABOLIC PANEL
Anion Gap: 9 (ref 5.0–15.0)
BUN: 47 mg/dL — ABNORMAL HIGH (ref 7.0–21.0)
CO2: 20 mEq/L (ref 17–29)
Calcium: 8.1 mg/dL — ABNORMAL LOW (ref 8.5–10.5)
Chloride: 107 mEq/L (ref 99–111)
Creatinine: 2.4 mg/dL — ABNORMAL HIGH (ref 0.4–1.0)
Glucose: 225 mg/dL — ABNORMAL HIGH (ref 70–100)
Potassium: 4.3 mEq/L (ref 3.5–5.3)
Sodium: 136 mEq/L (ref 135–145)

## 2022-02-03 LAB — GLUCOSE WHOLE BLOOD - POCT
Whole Blood Glucose POCT: 342 mg/dL — ABNORMAL HIGH (ref 70–100)
Whole Blood Glucose POCT: 322 mg/dL — ABNORMAL HIGH (ref 70–100)
Whole Blood Glucose POCT: 197 mg/dL — ABNORMAL HIGH (ref 70–100)
Whole Blood Glucose POCT: 391 mg/dL — ABNORMAL HIGH (ref 70–100)
Whole Blood Glucose POCT: 260 mg/dL — ABNORMAL HIGH (ref 70–100)

## 2022-02-03 LAB — PT AND APTT
PT INR: 1.2 — ABNORMAL HIGH (ref 0.9–1.1)
PT: 14 s — ABNORMAL HIGH (ref 10.1–12.9)
PTT: 41 s — ABNORMAL HIGH (ref 27–39)

## 2022-02-03 LAB — HIGH SENSITIVITY TROPONIN-I WITH DELTA
hs Troponin-I Delta: 0.7 ng/L
hs Troponin-I: 31.6 ng/L — AB

## 2022-02-03 LAB — HEMOGLOBIN A1C
Average Estimated Glucose: 223.1 mg/dL
Hemoglobin A1C: 9.4 % — ABNORMAL HIGH (ref 4.6–5.9)

## 2022-02-03 LAB — PHOSPHORUS: Phosphorus: 4.2 mg/dL (ref 2.3–4.7)

## 2022-02-03 LAB — TSH: TSH: 2.94 u[IU]/mL (ref 0.35–4.94)

## 2022-02-03 LAB — HIGH SENSITIVITY TROPONIN-I: hs Troponin-I: 19.2 ng/L — AB

## 2022-02-03 LAB — HEMOLYSIS INDEX
Hemolysis Index: 19 Index (ref 0–24)
Hemolysis Index: 27 Index — ABNORMAL HIGH (ref 0–24)

## 2022-02-03 LAB — GFR
EGFR: 21.1
EGFR: 22.1

## 2022-02-03 MED ORDER — GABAPENTIN 300 MG PO CAPS
600.0000 mg | ORAL_CAPSULE | Freq: Two times a day (BID) | ORAL | Status: DC
Start: 2022-02-03 — End: 2022-02-03
  Administered 2022-02-03: 600 mg via ORAL
  Filled 2022-02-03: qty 2

## 2022-02-03 MED ORDER — DEXTROSE 10 % IV BOLUS
12.5000 g | INTRAVENOUS | Status: DC | PRN
Start: 2022-02-03 — End: 2022-02-03

## 2022-02-03 MED ORDER — APIXABAN 5 MG PO TABS
10.0000 mg | ORAL_TABLET | Freq: Two times a day (BID) | ORAL | Status: DC
Start: 2022-02-03 — End: 2022-02-06
  Administered 2022-02-03 – 2022-02-06 (×6): 10 mg via ORAL
  Filled 2022-02-03 (×6): qty 2

## 2022-02-03 MED ORDER — ARIPIPRAZOLE 5 MG PO TABS
10.0000 mg | ORAL_TABLET | Freq: Every day | ORAL | Status: DC
Start: 2022-02-03 — End: 2022-02-06
  Administered 2022-02-03 – 2022-02-06 (×4): 10 mg via ORAL
  Filled 2022-02-03 (×4): qty 2

## 2022-02-03 MED ORDER — HYDROXYZINE HCL 10 MG PO TABS
10.0000 mg | ORAL_TABLET | Freq: Four times a day (QID) | ORAL | Status: DC | PRN
Start: 2022-02-03 — End: 2022-02-06
  Filled 2022-02-03: qty 1

## 2022-02-03 MED ORDER — ASPIRIN 81 MG PO CHEW
81.0000 mg | CHEWABLE_TABLET | Freq: Every day | ORAL | Status: DC
Start: 2022-02-03 — End: 2022-02-06
  Administered 2022-02-03 – 2022-02-06 (×4): 81 mg via ORAL
  Filled 2022-02-03 (×4): qty 1

## 2022-02-03 MED ORDER — APIXABAN 5 MG PO TABS
5.0000 mg | ORAL_TABLET | Freq: Two times a day (BID) | ORAL | Status: DC
Start: 2022-02-09 — End: 2022-02-06

## 2022-02-03 MED ORDER — GLUCAGON 1 MG IJ SOLR (WRAP)
1.0000 mg | INTRAMUSCULAR | Status: DC | PRN
Start: 2022-02-03 — End: 2022-02-03

## 2022-02-03 MED ORDER — POLYETHYLENE GLYCOL 3350 17 G PO PACK
17.0000 g | PACK | Freq: Every day | ORAL | Status: DC | PRN
Start: 2022-02-03 — End: 2022-02-06
  Administered 2022-02-06: 17 g via ORAL
  Filled 2022-02-03: qty 1

## 2022-02-03 MED ORDER — HYDRALAZINE HCL 50 MG PO TABS
75.0000 mg | ORAL_TABLET | Freq: Two times a day (BID) | ORAL | Status: DC
Start: 2022-02-03 — End: 2022-02-05
  Filled 2022-02-03 (×2): qty 1

## 2022-02-03 MED ORDER — BUTALBITAL-APAP-CAFFEINE 50-325-40 MG PO TABS
1.0000 | ORAL_TABLET | Freq: Once | ORAL | Status: AC
Start: 2022-02-03 — End: 2022-02-03
  Administered 2022-02-03: 1 via ORAL
  Filled 2022-02-03: qty 1

## 2022-02-03 MED ORDER — TRAZODONE HCL 50 MG PO TABS
150.0000 mg | ORAL_TABLET | Freq: Every evening | ORAL | Status: DC
Start: 2022-02-03 — End: 2022-02-06
  Administered 2022-02-03 – 2022-02-05 (×3): 150 mg via ORAL
  Filled 2022-02-03 (×3): qty 3

## 2022-02-03 MED ORDER — GLUCOSE 40 % PO GEL (WRAP)
15.0000 g | ORAL | Status: DC | PRN
Start: 2022-02-03 — End: 2022-02-06

## 2022-02-03 MED ORDER — BACLOFEN 10 MG PO TABS
10.0000 mg | ORAL_TABLET | ORAL | Status: DC | PRN
Start: 2022-02-03 — End: 2022-02-03

## 2022-02-03 MED ORDER — ACETAMINOPHEN 650 MG RE SUPP
650.0000 mg | Freq: Four times a day (QID) | RECTAL | Status: DC | PRN
Start: 2022-02-03 — End: 2022-02-06

## 2022-02-03 MED ORDER — GABAPENTIN 300 MG PO CAPS
300.0000 mg | ORAL_CAPSULE | Freq: Two times a day (BID) | ORAL | Status: DC
Start: 2022-02-03 — End: 2022-02-06
  Administered 2022-02-03 – 2022-02-06 (×6): 300 mg via ORAL
  Filled 2022-02-03 (×6): qty 1

## 2022-02-03 MED ORDER — ATORVASTATIN CALCIUM 20 MG PO TABS
80.0000 mg | ORAL_TABLET | Freq: Every evening | ORAL | Status: DC
Start: 2022-02-03 — End: 2022-02-06
  Administered 2022-02-03 – 2022-02-05 (×3): 80 mg via ORAL
  Filled 2022-02-03 (×3): qty 4

## 2022-02-03 MED ORDER — BUMETANIDE 1 MG PO TABS
2.0000 mg | ORAL_TABLET | Freq: Two times a day (BID) | ORAL | Status: DC
Start: 2022-02-03 — End: 2022-02-04
  Administered 2022-02-03 (×2): 2 mg via ORAL
  Filled 2022-02-03 (×4): qty 2

## 2022-02-03 MED ORDER — OXYCODONE HCL 5 MG PO TABS
5.0000 mg | ORAL_TABLET | Freq: Four times a day (QID) | ORAL | Status: DC | PRN
Start: 2022-02-03 — End: 2022-02-06
  Administered 2022-02-03 – 2022-02-06 (×8): 5 mg via ORAL
  Filled 2022-02-03 (×8): qty 1

## 2022-02-03 MED ORDER — HYDRALAZINE HCL 50 MG PO TABS
50.0000 mg | ORAL_TABLET | Freq: Three times a day (TID) | ORAL | Status: DC
Start: 2022-02-03 — End: 2022-02-03

## 2022-02-03 MED ORDER — INSULIN GLARGINE 100 UNIT/ML SC SOLN
35.0000 [IU] | Freq: Two times a day (BID) | SUBCUTANEOUS | Status: DC
Start: 2022-02-03 — End: 2022-02-03
  Administered 2022-02-03: 35 [IU] via SUBCUTANEOUS
  Filled 2022-02-03 (×2): qty 35

## 2022-02-03 MED ORDER — DIPHENHYDRAMINE HCL 50 MG/ML IJ SOLN
12.5000 mg | Freq: Once | INTRAMUSCULAR | Status: DC
Start: 2022-02-03 — End: 2022-02-06

## 2022-02-03 MED ORDER — FUROSEMIDE 10 MG/ML IJ SOLN
20.0000 mg | Freq: Once | INTRAMUSCULAR | Status: AC
Start: 2022-02-03 — End: 2022-02-03
  Administered 2022-02-03: 20 mg via INTRAVENOUS
  Filled 2022-02-03: qty 2

## 2022-02-03 MED ORDER — DULOXETINE HCL 60 MG PO CPEP
60.0000 mg | ORAL_CAPSULE | Freq: Every day | ORAL | Status: DC
Start: 2022-02-03 — End: 2022-02-06
  Administered 2022-02-03 – 2022-02-06 (×4): 60 mg via ORAL
  Filled 2022-02-03 (×4): qty 1

## 2022-02-03 MED ORDER — INSULIN LISPRO 100 UNIT/ML SOLN (WRAP)
1.0000 [IU] | Freq: Three times a day (TID) | Status: DC
Start: 2022-02-03 — End: 2022-02-06
  Administered 2022-02-03: 7 [IU] via SUBCUTANEOUS
  Administered 2022-02-03: 1 [IU] via SUBCUTANEOUS
  Administered 2022-02-03: 8 [IU] via SUBCUTANEOUS
  Administered 2022-02-04 – 2022-02-06 (×4): 3 [IU] via SUBCUTANEOUS
  Filled 2022-02-03: qty 24
  Filled 2022-02-03: qty 3
  Filled 2022-02-03: qty 6
  Filled 2022-02-03: qty 21

## 2022-02-03 MED ORDER — IRON SUCROSE 20 MG/ML IV SOLN
200.0000 mg | INTRAVENOUS | Status: DC
Start: 2022-02-03 — End: 2022-02-06
  Administered 2022-02-03 – 2022-02-06 (×4): 200 mg via INTRAVENOUS
  Filled 2022-02-03 (×4): qty 10

## 2022-02-03 MED ORDER — INSULIN LISPRO 100 UNIT/ML SOLN (WRAP)
1.0000 [IU] | Freq: Once | Status: AC
Start: 2022-02-03 — End: 2022-02-03
  Administered 2022-02-03: 4 [IU] via SUBCUTANEOUS
  Filled 2022-02-03: qty 12

## 2022-02-03 MED ORDER — BACLOFEN 10 MG PO TABS
10.0000 mg | ORAL_TABLET | Freq: Three times a day (TID) | ORAL | Status: DC | PRN
Start: 2022-02-03 — End: 2022-02-06

## 2022-02-03 MED ORDER — HYDRALAZINE HCL 50 MG PO TABS
50.0000 mg | ORAL_TABLET | Freq: Two times a day (BID) | ORAL | Status: DC
Start: 2022-02-03 — End: 2022-02-03
  Administered 2022-02-03 (×2): 50 mg via ORAL
  Filled 2022-02-03 (×2): qty 1

## 2022-02-03 MED ORDER — GLUCOSE 40 % PO GEL (WRAP)
15.0000 g | ORAL | Status: DC | PRN
Start: 2022-02-03 — End: 2022-02-03

## 2022-02-03 MED ORDER — PROMETHAZINE HCL 25 MG PO TABS
25.0000 mg | ORAL_TABLET | Freq: Four times a day (QID) | ORAL | Status: DC | PRN
Start: 2022-02-03 — End: 2022-02-06
  Administered 2022-02-05: 09:00:00 25 mg via ORAL
  Filled 2022-02-03 (×2): qty 1

## 2022-02-03 MED ORDER — PROMETHAZINE HCL 25 MG RE SUPP
25.0000 mg | Freq: Four times a day (QID) | RECTAL | Status: DC | PRN
Start: 2022-02-03 — End: 2022-02-06

## 2022-02-03 MED ORDER — BUPROPION HCL ER (XL) 150 MG PO TB24
150.0000 mg | ORAL_TABLET | Freq: Every day | ORAL | Status: DC
Start: 2022-02-03 — End: 2022-02-06
  Administered 2022-02-03 – 2022-02-06 (×4): 150 mg via ORAL
  Filled 2022-02-03 (×4): qty 1

## 2022-02-03 MED ORDER — CARVEDILOL 25 MG PO TABS
25.0000 mg | ORAL_TABLET | Freq: Two times a day (BID) | ORAL | Status: DC
Start: 2022-02-03 — End: 2022-02-06
  Administered 2022-02-03 – 2022-02-06 (×6): 25 mg via ORAL
  Filled 2022-02-03 (×7): qty 1

## 2022-02-03 MED ORDER — PANTOPRAZOLE SODIUM 40 MG PO TBEC
40.0000 mg | DELAYED_RELEASE_TABLET | Freq: Every morning | ORAL | Status: DC
Start: 2022-02-03 — End: 2022-02-06
  Administered 2022-02-03 – 2022-02-06 (×4): 40 mg via ORAL
  Filled 2022-02-03 (×4): qty 1

## 2022-02-03 MED ORDER — PROCHLORPERAZINE EDISYLATE 10 MG/2ML IJ SOLN
5.0000 mg | Freq: Once | INTRAMUSCULAR | Status: AC
Start: 2022-02-03 — End: 2022-02-03
  Administered 2022-02-03: 5 mg via INTRAVENOUS
  Filled 2022-02-03 (×2): qty 2

## 2022-02-03 MED ORDER — MONTELUKAST SODIUM 10 MG PO TABS
10.0000 mg | ORAL_TABLET | Freq: Every evening | ORAL | Status: DC
Start: 2022-02-03 — End: 2022-02-06
  Administered 2022-02-03 – 2022-02-05 (×3): 10 mg via ORAL
  Filled 2022-02-03 (×3): qty 1

## 2022-02-03 MED ORDER — NALOXONE HCL 0.4 MG/ML IJ SOLN (WRAP)
0.2000 mg | INTRAMUSCULAR | Status: DC | PRN
Start: 2022-02-03 — End: 2022-02-06

## 2022-02-03 MED ORDER — DOCUSATE SODIUM 100 MG PO CAPS
100.0000 mg | ORAL_CAPSULE | Freq: Two times a day (BID) | ORAL | Status: DC
Start: 2022-02-04 — End: 2022-02-06
  Administered 2022-02-04 – 2022-02-06 (×5): 100 mg via ORAL
  Filled 2022-02-03 (×5): qty 1

## 2022-02-03 MED ORDER — DEXTROSE 10 % IV BOLUS
12.5000 g | INTRAVENOUS | Status: DC | PRN
Start: 2022-02-03 — End: 2022-02-06

## 2022-02-03 MED ORDER — GLUCAGON 1 MG IJ SOLR (WRAP)
1.0000 mg | INTRAMUSCULAR | Status: DC | PRN
Start: 2022-02-03 — End: 2022-02-06

## 2022-02-03 MED ORDER — ALBUTEROL SULFATE HFA 108 (90 BASE) MCG/ACT IN AERS
2.0000 | INHALATION_SPRAY | Freq: Four times a day (QID) | RESPIRATORY_TRACT | Status: DC | PRN
Start: 2022-02-03 — End: 2022-02-06

## 2022-02-03 MED ORDER — INSULIN LISPRO 100 UNIT/ML SOLN (WRAP)
10.0000 [IU] | Freq: Three times a day (TID) | Status: DC
Start: 2022-02-03 — End: 2022-02-06
  Administered 2022-02-03 – 2022-02-05 (×7): 10 [IU] via SUBCUTANEOUS
  Filled 2022-02-03 (×6): qty 30

## 2022-02-03 MED ORDER — FOLIC ACID 1 MG PO TABS
1.0000 mg | ORAL_TABLET | Freq: Every day | ORAL | Status: DC
Start: 2022-02-03 — End: 2022-02-06
  Administered 2022-02-03 – 2022-02-06 (×4): 1 mg via ORAL
  Filled 2022-02-03 (×4): qty 1

## 2022-02-03 MED ORDER — INSULIN LISPRO 100 UNIT/ML SOLN (WRAP)
1.0000 [IU] | Freq: Every evening | Status: DC
Start: 2022-02-03 — End: 2022-02-06
  Administered 2022-02-03: 3 [IU] via SUBCUTANEOUS
  Administered 2022-02-04: 2 [IU] via SUBCUTANEOUS
  Filled 2022-02-03: qty 6
  Filled 2022-02-03: qty 9

## 2022-02-03 MED ORDER — FLUTICASONE PROPIONATE 50 MCG/ACT NA SUSP
2.0000 | Freq: Every day | NASAL | Status: DC
Start: 2022-02-03 — End: 2022-02-06
  Administered 2022-02-03 – 2022-02-06 (×4): 2 via NASAL
  Filled 2022-02-03: qty 16

## 2022-02-03 MED ORDER — GABAPENTIN 300 MG PO CAPS
800.0000 mg | ORAL_CAPSULE | Freq: Three times a day (TID) | ORAL | Status: DC
Start: 2022-02-03 — End: 2022-02-03

## 2022-02-03 MED ORDER — INSULIN GLARGINE 100 UNIT/ML SC SOLN
35.0000 [IU] | Freq: Two times a day (BID) | SUBCUTANEOUS | Status: DC
Start: 2022-02-03 — End: 2022-02-06
  Administered 2022-02-03 – 2022-02-05 (×4): 35 [IU] via SUBCUTANEOUS
  Filled 2022-02-03 (×6): qty 35

## 2022-02-03 MED ORDER — VITAMIN D (ERGOCALCIFEROL) 1.25 MG (50000 UT) PO CAPS
50000.0000 [IU] | ORAL_CAPSULE | ORAL | Status: DC
Start: 2022-02-03 — End: 2022-02-06
  Administered 2022-02-03: 50000 [IU] via ORAL
  Filled 2022-02-03: qty 1

## 2022-02-03 MED ORDER — CETIRIZINE HCL 10 MG PO TABS
5.0000 mg | ORAL_TABLET | Freq: Every evening | ORAL | Status: DC
Start: 2022-02-03 — End: 2022-02-06
  Administered 2022-02-03 – 2022-02-05 (×3): 5 mg via ORAL
  Filled 2022-02-03 (×3): qty 1

## 2022-02-03 MED ORDER — DEXTROSE 50 % IV SOLN
12.5000 g | INTRAVENOUS | Status: DC | PRN
Start: 2022-02-03 — End: 2022-02-03

## 2022-02-03 MED ORDER — INSULIN GLARGINE 100 UNIT/ML SC SOLN
40.0000 [IU] | Freq: Two times a day (BID) | SUBCUTANEOUS | Status: DC
Start: 2022-02-03 — End: 2022-02-03

## 2022-02-03 MED ORDER — DEXTROSE 50 % IV SOLN
12.5000 g | INTRAVENOUS | Status: DC | PRN
Start: 2022-02-03 — End: 2022-02-06

## 2022-02-03 MED ORDER — MAGNESIUM SULFATE IN D5W 1-5 GM/100ML-% IV SOLN
1.0000 g | Freq: Once | INTRAVENOUS | Status: AC
Start: 2022-02-03 — End: 2022-02-03
  Administered 2022-02-03: 1 g via INTRAVENOUS
  Filled 2022-02-03: qty 100

## 2022-02-03 MED ORDER — ACETAMINOPHEN 325 MG PO TABS
650.0000 mg | ORAL_TABLET | Freq: Four times a day (QID) | ORAL | Status: DC | PRN
Start: 2022-02-03 — End: 2022-02-06
  Filled 2022-02-03: qty 2

## 2022-02-03 MED ORDER — BUDESONIDE-FORMOTEROL FUMARATE 160-4.5 MCG/ACT IN AERO
2.0000 | INHALATION_SPRAY | Freq: Two times a day (BID) | RESPIRATORY_TRACT | Status: DC
Start: 2022-02-03 — End: 2022-02-06
  Administered 2022-02-03 – 2022-02-06 (×7): 2 via RESPIRATORY_TRACT
  Filled 2022-02-03: qty 6

## 2022-02-03 NOTE — Plan of Care (Signed)
Problem: Pain interferes with ability to perform ADL  Goal: Pain at adequate level as identified by patient  Outcome: Progressing  Flowsheets (Taken 02/03/2022 2300)  Pain at adequate level as identified by patient:   Identify patient comfort function goal   Assess for risk of opioid induced respiratory depression, including snoring/sleep apnea. Alert healthcare team of risk factors identified.   Assess pain on admission, during daily assessment and/or before any "as needed" intervention(s)   Reassess pain within 30-60 minutes of any procedure/intervention, per Pain Assessment, Intervention, Reassessment (AIR) Cycle   Evaluate if patient comfort function goal is met   Evaluate patient's satisfaction with pain management progress

## 2022-02-03 NOTE — ED Notes (Signed)
[facility] EMERGENCY DEPARTMENT  ED NURSING NOTE FOR THE RECEIVING INPATIENT NURSE   ED NURSE Merlyn Lot [phone]   ED CHARGE RN Kal    ADMISSION INFORMATION   [name] is a 41 y.o. female admitted with an ED diagnosis of:    1. AKI (acute kidney injury)         Isolation: None   Allergies: Bactrim [sulfamethoxazole-trimethoprim], Glipizide, and Lisinopril   Holding Orders confirmed? Yes   Belongings Documented? Yes   Home medications sent to pharmacy confirmed? N/A   NURSING CARE   Patient Comes From:   Mental Status: Home Independent  alert and oriented   ADL: Independent with all ADLs   Ambulation: mild difficulty   Pertinent Information  and Safety Concerns: N/a     CT / NIH   CT Head ordered on this patient?  N/A   NIH/Dysphagia assessment done prior to admission? N/A   VITAL SIGNS (at the time of this note)      Vitals:    02/03/22 0030   BP: 187/81   Pulse: 86   Resp: 21   Temp: 98.6 F (37 C)   SpO2: 100%

## 2022-02-03 NOTE — PT Eval Note (Signed)
Physical Therapy Evaluation    [name]    Unit: [facility]  Bed: J478/G956-21      Post Acute Care Therapy Recommendations:   Discharge Recommendations:  Home with supervision, Home with outpatient PT  DME needs IF patient is discharging home: Bariatric FWW vs. Rollator (pt prefers rollator)    Therapy discharge recommendations may change with patient status.  Please refer to most recent note for up-to-date recommendations.    Recommended (non-medical) mode of transportation at discharge:  car    PMP - Progressive Mobility Protocol   PMP Activity: Step 7 - Walks out of Room  Distance Walked (ft) (Step 6,7): 50 Feet      Evaluation:   Consult received for [name] for PT evaluation and treatment.  Chart reviewed.  Patient's medical condition is appropriate for Physical Therapy intervention at this time.     Medical Diagnosis: AKI (acute kidney injury) [N17.9]    Therapy Diagnosis: difficulty walking, generalized muscle weakness    Precautions  Weight Bearing Status: no restrictions  Other Precautions: Falls    History of Present Illness: [name] is a 41 y.o. female admitted on 02/02/2022 with  LLE pain.  + DVT.  Pt started on [facility] yesterday.       Patient Active Problem List   Diagnosis    Acute renal failure superimposed on stage 3 chronic kidney disease    Drug-induced hyperglycemia    Peripheral edema    CHF NYHA class III, acute, combined    Morbid obesity    Hypertension    Fibromyalgia    Diabetic neuropathy associated with type 2 diabetes mellitus    Type II diabetes mellitus, uncontrolled    Type 2 diabetes mellitus with diabetic chronic kidney disease    AKI (acute kidney injury)     Past Medical History:   Diagnosis Date    Anemia     Arthritis     Asthma     Chronic kidney disease     Stage III    Congestive heart failure     Diabetes mellitus     Fibromyalgia     Gastroesophageal reflux disease     Hyperlipidemia     Hypertension     Neuropathy     Pulmonary embolism  08/2019    Renal insufficiency     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     Scoliosis     Sleep apnea     no cpap    Type 2 diabetes mellitus, controlled     Vertigo      Past Surgical History:   Procedure Laterality Date    ABLATION ENDOMETRIAL      APPENDECTOMY (OPEN)      CESAREAN SECTION      x 2    CHOLECYSTECTOMY      EYE SURGERY Bilateral     cataract    HYSTERECTOMY  12/2019    REPAIR, UPPER EXTREMITY, TENDON & NERVE Right 12/05/2021    Procedure: REPAIR, RIGHT ULNAR NERVE AND NEUROPLASTY @ ELBOW AND WRIST, RIGHT OPEN CARPAL TUNNEL RELEASE;  Surgeon: Cathrine Muster, MD;  Location: [facility];  Service: Plastics;  Laterality: Right;    UTERINE RUPTURE       Previous Functional Level/Home Living Situation  Prior Level of Function  Prior level of function: Ambulates independently;Needs assistance with ADLs  Baseline Activity Level: Household ambulation  Cooking: light meal prep  DME Currently at Home:  (none)  Home Living Arrangements  Living Arrangements:  (God child who works as a Lawyer)  Type of Home: [employer] Layout: One level (They live on basement floor apt in house w/ no stairs to enter)  [employer] Shower/Tub: Pension scheme manager: Standard  DME Currently at Home:  (none)    Subjective: Patient is agreeable to participation in the therapy session. Nursing clears patient for therapy.   Pain Assessment  Pain Assessment: Numeric Scale (0-10)  Pain Score: 3-mild pain     Objective:  Patient is in bed with telemetry in place.    Observation of patient/vitals     VSS       Cognition/Neuro Status  Arousal/Alertness: Appropriate responses to stimuli  Attention Span: Appears intact  Orientation Level: Oriented X4  Behavior: calm;cooperative  Motor Planning: intact  Coordination: intact    Musculoskeletal Examination  Gross ROM  Right Lower Extremity ROM: within functional limits  Left Lower Extremity ROM: within functional limits     Gross Strength  Right Lower Extremity Strength: 4+/5  Left  Lower Extremity Strength: 4-/5     Sensation: reports baseline B peripheral neuropathy in feet     Functional Mobility  Rolling: Supervision  Supine to Sit: Supervision  Scooting to HOB: Supervision  Scooting to EOB: Supervision  Sit to Supine: Supervision  Sit to Stand: Supervision  Stand to Sit: Supervision    AM-PAC Basic Mobility  Turning Over in Bed: A little  Sitting Down On/Standing From Armchair: A little  Lying on Back to Sitting on Side of Bed: A little  Assist Moving to/from Bed to Chair: A little  Assist to Walk in Hospital Room: A little  Assist to Climb 3-5 Steps with Railing: A little  PT Basic Mobility Raw Score: 18    Locomotion  Ambulation: with front-wheeled walker;Stand by Assist (66ft)  Pattern: decreased cadence;decreased step length;R foot decreased clearance;L foot decreased clearance    Participation and Endurance  Participation Effort: good  Endurance: Endurance does not limit participation in activity     Treatment:    PT Evaluation     Assessment:    Assessment: Decreased balance;Decreased functional mobility;Gait impairment;Decreased sensation    Patient History: (Low = none; Mod = 1-2; High = 3 or more)   Personal factors and pre-existing co-morbidities affecting plan of care include baseline co morbidities     Examination: (Low = 1-2; Mod = 3 or more; High = 4 or more)   Plan of care will address impairments in the follow body systems: pain, cardiopulmonary system, range of motion, strength, and gait as demonstrated by these objective measures: pain scale, vital signs, manual muscle test, AM-PAC, and gait analysis    Clinical presentation is evolving due to abnormal cardiovascular response to activity, abnormal lab values, and testing in progress  Plan of care complexity impacted by situation that requires coordination with other healthcare professional(s), close monitoring of vitals signs, and close monitoring of labs  Prognosis: Good;With continued PT status post acute  discharge    Plan:     Risks/Benefits/POC Discussed with Pt/Family: With patient   Treatment/Interventions: Exercise;Gait training;Stair training;Neuromuscular re-education;LE strengthening/ROM    Plan  Risks/Benefits/POC Discussed with Pt/Family: With patient  Treatment/Interventions: Exercise;Gait training;Stair training;Neuromuscular re-education;LE strengthening/ROM    Goals   Goals  Goal Formulation: With patient  Time for Goal Acheivement: 5 visits  Goals: Select goal  Pt Will Transfer Bed/Chair: independent  Pt Will Ambulate: > 200 feet;with rolling walker;modified independent  Interdisciplinary Communication:   Pt left up in bed with alarm activated.  Updated white communication board in room with patient's current mobility status. Communicated with RN via FTF regarding pt mobility.      Education:   Educated patient  to role of physical therapy, plan of care, transfer training techniques, falls prevention, gait training techniques with RW, home safety, next appropriate level of care.      Patient  demonstrated good understanding.      Discussed goals of therapy with patient , in agreement with plan.      Time Calculation  PT Received On: 02/03/22  Start Time: 1738  Stop Time: 1754  Time Calculation (min): 16 min    Signature:  Lona Millard, PT, DPT   Unit: [facility] TELEMETRY  Bed: U272/Z366-44

## 2022-02-03 NOTE — Plan of Care (Signed)
Problem: Moderate/High Fall Risk Score >5  Goal: Patient will remain free of falls  Outcome: Progressing  Flowsheets (Taken 02/03/2022 0303)  Moderate Risk (6-13):   MOD-Apply bed exit alarm if patient is confused   MOD-Use gait belt when appropriate   MOD-Perform dangle, stand, walk (DSW) prior to mobilization   MOD-Remain with patient during toileting     Problem: Venous Thrombotic Event  Goal: Knowledge deficit/VTE Prevention  Outcome: Progressing  Flowsheets (Taken 02/03/2022 0302)  Knowledge deficit/ VTE Prevention:   Reinforce signs/symptoms to report to physician   Reinforce personal safety measures while on anticoagulant therapy   Review medications: to include dose, frequency, drug interactions and side effects   Discuss medications to avoid without physician approval   Discuss importance of follow up lab studies as directed by physician if discharged on anticoagulants

## 2022-02-03 NOTE — Consults (Signed)
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    Collier Flowers, MD  [facility] - [phone], or Jeanella Cara [facility] [phone]       History:   Azusena Jackson is a 41 y.o. female admitted with concerns about not feeling well with aches/headache etc - found to have DVT of LLE. She has known CKD @ baseline and noted to have elevated creatinine. She has long standing h/o DM/HTN and I have been requested to help manage renal issues while inpatient.    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     Scoliosis     Sleep apnea     no cpap    Type 2 diabetes mellitus, controlled     Vertigo      Past Surgical History:   Procedure Laterality Date    ABLATION ENDOMETRIAL      APPENDECTOMY (OPEN)      CESAREAN SECTION      x 2    CHOLECYSTECTOMY      EYE SURGERY Bilateral     cataract    HYSTERECTOMY  12/2019    REPAIR, UPPER EXTREMITY, TENDON & NERVE Right 12/05/2021    Procedure: REPAIR, RIGHT ULNAR NERVE AND NEUROPLASTY @ ELBOW AND WRIST, RIGHT OPEN CARPAL TUNNEL RELEASE;  Surgeon: Cathrine Muster, MD;  Location: [facility];  Service: Plastics;  Laterality: Right;    UTERINE RUPTURE       Family History:  Family History   Problem Relation Age of Onset    Heart disease Mother     Hypertension Mother     Diabetes Mother     Asthma Father     Hypertension Father     Diabetes Daughter     Asthma Son     Cancer Maternal Aunt     Cancer Maternal Grandmother      Social History:     Social History     Socioeconomic History    Marital status: Single   Tobacco Use    Smoking status: Never    Smokeless tobacco: Never   Vaping Use    Vaping status: Never Used   Substance and Sexual Activity    Alcohol use: Never    Drug use: Never   Social History Narrative    ** Merged History Encounter **          Allergies:     Allergies   Allergen Reactions    Bactrim [Sulfamethoxazole-Trimethoprim] Hives, Respiratory Distress and Edema    Glipizide Edema     Throat swelling    Lisinopril Edema     Tongue swelling     Medications:     Current Facility-Administered Medications   Medication Dose Route Frequency    apixaban  10 mg Oral Q12H SCH    Followed by    Melene Muller ON 02/09/2022] apixaban  5 mg Oral Q12H SCH    ARIPiprazole  10 mg Oral Daily    aspirin  81 mg Oral Daily    atorvastatin  80 mg Oral QHS    budesonide-formoterol  2 puff Inhalation BID    bumetanide  2 mg Oral BID    buPROPion XL  150 mg Oral Daily    carvedilol  25 mg Oral Q12H SCH     cetirizine  5 mg Oral QHS    diphenhydrAMINE  12.5 mg Intravenous Once    [START ON 02/04/2022] docusate sodium  100 mg Oral Q12H    DULoxetine  60 mg Oral Daily    fluticasone  2 spray Each Nare Daily    folic acid  1 mg Oral Daily    gabapentin  600 mg Oral BID    hydrALAZINE  75 mg Oral Q12H    insulin glargine  35 Units Subcutaneous Q12H    insulin lispro  1-4 Units Subcutaneous QHS    insulin lispro  1-8 Units Subcutaneous TID AC    insulin lispro  10 Units Subcutaneous TID AC    montelukast  10 mg Oral QHS    pantoprazole  40 mg Oral QAM AC    traZODone  150 mg Oral QHS     Review of Systems:   ROS is otherwise negative  Physical Exam:   Temp:  [97.5 F (36.4 C)-99 F (37.2 C)] 98.1 F (36.7 C)  Heart Rate:  [80-102] 80  Resp Rate:  [17-21] 17  BP: (116-196)/(76-103) 116/76    Intake and Output Summary (Last 24 hours) at Date Time    Intake/Output Summary (Last 24 hours) at 02/03/2022 1427  Last data filed at 02/03/2022 0500  Gross per 24 hour   Intake 240 ml   Output --   Net 240 ml       Gen: No acute distress  Eyes: Pallor, anicteric  CVS: S1 S2 present, no rub  RS: Dec AE @ bases  Abd: Soft, non-tender  Skin: Warm and dry  Psych: Flat affect  CNS: Grossly non  focal    Labs:     Recent Labs   Lab 02/03/22  1048 02/03/22  0436 02/02/22  2306   Glucose 225* 427* 375*   BUN 47.0* 46.0* 44.0*   Creatinine 2.4* 2.5* 2.8*   Calcium 8.1* 7.8* 8.0*   Sodium 136 132* 134*   Potassium 4.3 4.5 4.7   Chloride 107 105 103   CO2 20 18 20    Albumin  --  1.9* 2.1*   Phosphorus  --  4.2  --    Magnesium 1.8 1.5*  --      Recent Labs   Lab 02/03/22  0436 02/02/22  2306   WBC 13.35* 14.98*   RBC 3.15* 3.35*   Hgb 8.4* 9.0*   Hematocrit 26.6* 28.2*   MCV 84.4 84.2   MCH 26.7 26.9   MCHC 31.6 31.9   RDW 12 12   MPV 10.9 10.7   Platelets 336 356*     Radiology:   Radiological studies reviewed  Prior Records:   I reviewed old records

## 2022-02-03 NOTE — H&P (Signed)
[facility]      Patient: [name]  Date: 02/02/2022   DOB: 1981/06/12  Admission Date: 02/02/2022   MRN: [id number]  Attending: Ron Parker, MD       Chief Complaint   Patient presents with    Otalgia    Back Pain    Knee Pain      History Gathered From: Self    HISTORY AND PHYSICAL     [name] is a 41 y.o. female with PMH HFrEF, h/o PE/DVT, CKD3, poorly controlled T2DM with diabetic retinopathy on long-term insulin, uncontrolled HTN, HLD, OSA not on CPAP, neuropathy, fibromyalgia, OA, asthma, anemia, depression/anxiety, obesity class 3 (BMI>48), 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, who presented with concerns of feeling all around unwell over the past week. She had posterior HA radiating into her neck, with achiness in her upper and mid back and shoulders. She had some left ear pain, "bronchitis type coughing," intermittent sweats like she might have fever, higher than usual blood pressures and blood sugars with feeling more tired. She also reports chronic nausea, but denies emesis, diarrhea, chest pain, SOB, abdominal pain, urinary symptoms. She became most concerned, however, when she developed worsening posterior left knee and thigh pain that reminded her of when she had DVT and PE in 2020, for which she had chronically been on Eliquis. She states she resumed Eliquis as advised post-operatively, but then stopped Eliquis 3 weeks ago after the incision on her right hand from her carpal tunnel release surgery 12/06/21 would not close, kept bleeding, and got infected. She was placed on Keflex and the incision healed, but she did not know when to resume Eliquis. She states this is only the second instance of DVT so it is unclear why she remained on Eliquis indefinitely after initial diagnosis of non-occlusive RLL PE October 2020. I was unable to find records for review of her visit detailing first diagnosis of PE.    Of note, her previous providers for Neurology,  Nephrology, Endocrinology, and Pulmonology were with [facility] but she has had since had change in insurance and is working on finding new providers through [employer]. She was seen by Dr. Verdell Face in 2021 when she was admitted with acute HF but it does not appear she has been following with him. She was referred to [facility], but it is unclear who she has seen for hematology and she cannot remember the name of who she most recently saw on telemedicine visit for hematology. She has followed with [facility] for her HF and HTN.    In ED,   Labs remarkable for elevated WBC 14.98, Hb 9 at baseline, mildly elevated PLT  Cr 2.8 with BUN 44.   Glucose 375. Mild hyponatremia 134. Hypocalcemia 8. Albumin 2.1  UA +glucose, prot  Hs Trop mildly elevated 30.9, likely demand ischemia. BNP 572  CXR without acute process  [place] BLE: Nonocclusive DVT of LEFT lower extremity mid and distal superficial femoral vein.  Echo May 2022: LVEF 50-55%. G1DD. No significant valvular dysfunction.    SH: no tobacco, alcohol, substance use    FH: mother - aneurysm, HTN, T2DM, CAD. father - HTN, deceased from asthma. daughter - T1DM. Son - asthma    Past Medical History:   Diagnosis Date    Anemia     Arthritis     Asthma     Chronic kidney disease     Stage III  Congestive heart failure     Diabetes mellitus     Fibromyalgia     Gastroesophageal reflux disease     Hyperlipidemia     Hypertension     Neuropathy     Pulmonary embolism 08/2019    Renal insufficiency     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     Scoliosis     Sleep apnea     no cpap    Type 2 diabetes mellitus, controlled     Vertigo        Past Surgical History:   Procedure Laterality Date    ABLATION ENDOMETRIAL      APPENDECTOMY (OPEN)      CESAREAN SECTION      x 2    CHOLECYSTECTOMY      EYE SURGERY Bilateral     cataract    HYSTERECTOMY  12/2019    REPAIR, UPPER EXTREMITY, TENDON & NERVE Right 12/05/2021    Procedure: REPAIR, RIGHT ULNAR NERVE AND NEUROPLASTY  @ ELBOW AND WRIST, RIGHT OPEN CARPAL TUNNEL RELEASE;  Surgeon: Cathrine Muster, MD;  Location: [facility];  Service: Plastics;  Laterality: Right;    UTERINE RUPTURE         Prior to Admission medications    Medication Sig Start Date End Date Taking? Authorizing Provider   albuterol sulfate HFA (PROVENTIL) 108 (90 Base) MCG/ACT inhaler Inhale 2 puffs into the lungs every 4 (four) hours as needed for Wheezing 03/18/21   Maurice Small R, PA   Alcohol Swabs Pads Use as indicated for insulin injections 08/13/20   Anola Gurney, MD   apixaban (ELIQUIS) 5 MG Take 5 mg by mouth every 12 (twelve) hours    [provider]   ARIPiprazole (ABILIFY) 5 MG tablet Take 10 mg by mouth daily 09/24/21   [provider]   aspirin 81 MG chewable tablet Chew 81 mg by mouth daily 12/25/20   [provider]   atorvastatin (LIPITOR) 40 MG tablet Take 80 mg by mouth nightly 09/05/21   [provider]   Azelastine-Fluticasone 137-50 MCG/ACT Suspension 1 spray by Nasal route 2 (two) times daily 05/17/21   [provider]   baclofen (LIORESAL) 10 MG tablet Take 10 mg by mouth as needed    [provider]   bumetanide (BUMEX) 2 MG tablet Take 1 tablet (2 mg total) by mouth 2 (two) times daily 08/21/20   Pilar Plate, NP   carvedilol (COREG) 25 MG tablet Take 25 mg by mouth 2 (two) times daily    [provider]   cetirizine (ZyrTEC) 5 MG tablet Take 5 mg by mouth daily 05/20/21 05/20/22  [provider]   diclofenac (VOLTAREN) 50 MG EC tablet Take 50 mg by mouth daily 09/05/21   [provider]   docusate sodium (COLACE) 100 MG capsule Take 100 mg by mouth nightly 09/05/21   [provider]   Elwin Sleight 200-5 MCG/ACT Aerosol Inhale 2 puffs into the lungs daily 09/05/21   [provider]   DULoxetine (CYMBALTA) 60 MG capsule Take 60 mg by mouth daily 09/24/21   [provider]   EPINEPHrine 0.15 MG/0.3ML injection Inject 0.15 mg into  the muscle as needed 09/05/21   [provider]   folic acid (FOLVITE) 1 MG tablet Take 1 mg by mouth daily    [provider]   gabapentin (NEURONTIN) 800 MG tablet Take 800 mg by mouth 3 (three) times daily 09/10/21  [provider]   hydrALAZINE (APRESOLINE) 50 MG tablet Take 50 mg by mouth 2 (two) times daily    [provider]   hydrOXYzine (ATARAX) 10 MG tablet Take 10 mg by mouth every 6 (six) hours as needed for Anxiety    [provider]   insulin detemir (Levemir) 100 UNIT/ML injection Inject 50 Units into the skin every 12 (twelve) hours  Patient taking differently: Inject 53 Units into the skin every 12 (twelve) hours 08/13/20   Anola Gurney, MD   insulin lispro (HumaLOG) 100 UNIT/ML injection Inject 25 Units into the skin 3 (three) times daily before meals  Patient taking differently: Inject 25 Units into the skin 3 (three) times daily before meals 08/13/20   Anola Gurney, MD   Insulin Syringe-Needle U-100 27G X 1/2" 1 ML Misc Inject 1 Dose into the skin 4 (four) times daily 08/13/20   Anola Gurney, MD   lidocaine (LIDODERM) 5 % Place 1 patch onto the skin every 24 hours Remove & Discard patch within 12 hours or as directed by MD 04/29/21   Maryella Shivers, MD   meclizine (ANTIVERT) 25 MG tablet Take 25 mg by mouth as needed    [provider]   metOLazone (ZAROXOLYN) 10 MG tablet Take 10 mg by mouth daily 12/25/20   [provider]   montelukast (SINGULAIR) 10 MG tablet Take 10 mg by mouth nightly    [provider]   olmesartan (BENICAR) 40 MG tablet Take 40 mg by mouth daily    [provider]   omeprazole (PriLOSEC) 40 MG capsule Take 40 mg by mouth nightly    [provider]   Semaglutide,0.25 or 0.5MG /DOS, (Ozempic, 0.25 or 0.5 MG/DOSE,) 2 MG/1.5ML Solution Pen-injector Inject into the skin    [provider]   topiramate (TOPAMAX) 100 MG tablet Take 100 mg by mouth daily    [provider]   traZODone (DESYREL) 150 MG tablet Take 150 mg by mouth    [provider]   Trulicity 1.5 MG/0.5ML Solution Pen-injector Inject 1.5 mg into the skin Once each week on Monday 09/06/21   [provider]   valACYclovir (VALTREX) 1000 MG tablet Take 1 g by mouth as needed    [provider]   Vilazodone HCl 40 MG Tab Take 1 tablet by mouth daily    [provider]       Allergies   Allergen Reactions    Bactrim [Sulfamethoxazole-Trimethoprim] Hives, Respiratory Distress and Edema    Glipizide Edema     Throat swelling    Lisinopril Edema     Tongue swelling       Family History   Problem Relation Age of Onset    Heart disease Mother     Hypertension Mother     Diabetes Mother     Asthma Father     Hypertension Father     Diabetes Daughter     Asthma Son     Cancer Maternal Aunt     Cancer Maternal Grandmother        Social History     Tobacco Use    Smoking status: Never    Smokeless tobacco: Never   Vaping Use    Vaping status: Never Used   Substance Use Topics    Alcohol use: Never    Drug use: Never       REVIEW OF SYSTEMS     Ten point  review of systems negative or as per HPI and below endorsements.    PHYSICAL EXAM     Vital Signs (most recent): BP 187/81   Pulse 86   Temp 98.6 F (37 C) (Oral)   Resp 21   Wt 135.4 kg (298 lb 8.1 oz)   SpO2 100%   BMI 48.18 kg/m   Constitutional: No apparent distress. Patient speaks freely in full sentences.   HEENT: Pupils round, equal, reactive. Anicteric sclera. No nasal discharge. MMM  Neck: supple, FROM  Cardiovascular: RRR, normal S1 S2, soft flow murmur  Respiratory: Normal rate. No retractions or increased work of breathing. Diminished bases otherwise CTAB  Gastrointestinal: +BS, soft, non-distended, non-tender. Exam limited by central adiposity  Genitourinary: no suprapubic or costovertebral angle tenderness  Musculoskeletal: Right hand with healed incision on palm, posterior right elbow. Bilateral LE with  generalized pitting edema, L>R from knee distally. Palpable pedal pulses. NVS intact. FROM of lower extremities  Skin: hyperpigmented old healed lesions BLE  Neurologic: EOMI, CN 2-12 grossly intact. no gross motor or sensory deficits,   Psychiatric: affect and mood appropriate. The patient is alert and interactive    LABS & IMAGING     Recent Results (from the past 24 hour(s))   CBC and differential    Collection Time: 02/02/22 11:06 PM   Result Value Ref Range    WBC 14.98 (H) 3.10 - 9.50 x10 3/uL    Hgb 9.0 (L) 11.4 - 14.8 g/dL    Hematocrit 54.0 (L) 34.7 - 43.7 %    Platelets 356 (H) 142 - 346 x10 3/uL    RBC 3.35 (L) 3.90 - 5.10 x10 6/uL    MCV 84.2 78.0 - 96.0 fL    MCH 26.9 25.1 - 33.5 pg    MCHC 31.9 31.5 - 35.8 g/dL    RDW 12 11 - 15 %    MPV 10.7 8.9 - 12.5 fL    Instrument Absolute Neutrophil Count 9.06 (H) 1.10 - 6.33 x10 3/uL    Neutrophils 60.4 None %    Lymphocytes Automated 29.8 None %    Monocytes 6.6 None %    Eosinophils Automated 2.1 None %    Basophils Automated 0.6 None %    Immature Granulocytes 0.5 None %    Nucleated RBC 0.0 0.0 - 0.0 /100 WBC    Neutrophils Absolute 9.06 (H) 1.10 - 6.33 x10 3/uL    Lymphocytes Absolute Automated 4.46 (H) 0.42 - 3.22 x10 3/uL    Monocytes Absolute Automated 0.99 (H) 0.21 - 0.85 x10 3/uL    Eosinophils Absolute Automated 0.31 0.00 - 0.44 x10 3/uL    Basophils Absolute Automated 0.09 (H) 0.00 - 0.08 x10 3/uL    Immature Granulocytes Absolute 0.07 0.00 - 0.07 x10 3/uL    Absolute NRBC 0.00 0.00 - 0.00 x10 3/uL   Comprehensive metabolic panel    Collection Time: 02/02/22 11:06 PM   Result Value Ref Range    Glucose 375 (H) 70 - 100 mg/dL    BUN 98.1 (H) 7.0 - 19.1 mg/dL    Creatinine 2.8 (H) 0.4 - 1.0 mg/dL    Sodium 478 (L) 295 - 145 mEq/L    Potassium 4.7 3.5 - 5.3 mEq/L    Chloride 103 99 - 111 mEq/L    CO2 20 17 - 29 mEq/L    Calcium 8.0 (L) 8.5 - 10.5 mg/dL    Protein, Total 5.5 (L) 6.0 - 8.3 g/dL    Albumin 2.1 (  L) 3.5 - 5.0 g/dL    AST (SGOT) 9 5 - 41 U/L     ALT 9 0 - 55 U/L    Alkaline Phosphatase 113 37 - 117 U/L    Bilirubin, Total 0.1 (L) 0.2 - 1.2 mg/dL    Globulin 3.4 2.0 - 3.6 g/dL    Albumin/Globulin Ratio 0.6 (L) 0.9 - 2.2    Anion Gap 11.0 5.0 - 15.0   High Sensitivity Troponin-I at 0 hrs    Collection Time: 02/02/22 11:06 PM   Result Value Ref Range    hs Troponin-I 30.9 (A) SEE BELOW ng/L   NT-proBNP    Collection Time: 02/02/22 11:06 PM   Result Value Ref Range    NT-proBNP 572 (H) 0 - 125 pg/mL   GFR    Collection Time: 02/02/22 11:06 PM   Result Value Ref Range    EGFR 18.5     COVID-19 (SARS-CoV-2) only ([facility]) asymptomatic admission    Collection Time: 02/03/22 12:27 AM    Specimen: Nasopharyngeal   Result Value Ref Range    Purpose of COVID testing Screening     SARS-CoV-2 Specimen Source Nasal Swab     SARS CoV 2 Overall Result Not Detected    High Sensitivity Troponin-I at 2 hrs with calculated Delta    Collection Time: 02/03/22  1:12 AM   Result Value Ref Range    hs Troponin-I 31.6 (A) SEE BELOW ng/L    hs Troponin-I Delta 0.7 ng/L       IMAGING (xray images personally reviewed and concur with radiologist unless otherwise stated below):  XR Chest  AP Portable   Final Result    Negative for acute process             Gerlene BurdockJohn Lee, MD   02/02/2022 11:15 PM      US VenoDopp Low Extremity Left   Final Result          1. Nonocclusive deep venous thrombosis of the LEFT lower extremity mid   and distal superficial femoral vein.      Critical results were discussed with Dr. Lorene Dyhristie by Dr. Jackey LogeGerst at 11:20   PM on 02/02/2022.      Judd GaudierMonica Gerst, MD   02/02/2022 11:30 PM          CARDIAC:  EKG Interpretation (on my review 3):  NSR HR 91, no acute ST-T elevation or depression. Mildly prolonged QT. Similar to prior EKG Nov 2022    Markers:  Recent Labs   Lab 02/03/22  0112 02/02/22  2306   hs Troponin-I 31.6* 30.9*   hs Troponin-I Delta 0.7  --        EMERGENCY DEPARTMENT COURSE:  Orders Placed This Encounter   Procedures    COVID-19 (SARS-CoV-2) only ([facility]) asymptomatic admission    XR Chest  AP Portable    US VenoDopp Low Extremity Left    CBC and differential    Comprehensive metabolic panel    High Sensitivity Troponin-I at 0 hrs    High Sensitivity Troponin-I at 2 hrs with calculated Delta    NT-proBNP    GFR    Full Code    ECG 12 lead    Adult Admit to Observation       ASSESSMENT & PLAN     [name] is a 41 y.o. female admitted under OBSERVATION with...    Nonocclusive DVT of LEFT lower extremity, provoked  Prior h/o PE/DVT, normally  chronically on Eliquis  Leukocytosis, likely reactive  - [place] report shows LLE non-occlusive DVT. Right LE patent, no e/o DVT  - Eliquis re-started at 10mg  loading dose for next 7d, followed by 5mg   - Oxycodone 5mg  q6h prn pain  - Leukocytosis likely reactive 2/to DVT as no signs of infection  - Consult Hematology in AM  - Bleeding precautions    Acute on chronic HFpEF  Mildly elevated troponin likely demand ischemia  Suspected cardiorenal syndrome  Uncontrolled HTN  - Echo May 2022: LVEF 50-55%. G1DD. No significant valvular dysfunction.  - BNP mildly elevated, CXR without acute process  - Trop mildly elevated though flat x 2, will check 3rd in 6h  - Single dose furosemide 20mg  IV  - Continue Bumex 2mg  BID  - Continue carvedilol, hydralazine, low dose aspirin  - Hold olmesartan, metolazone due to AKI  - Consider consulting [facility]    HLD  - Check lipid profile  - Continue atorvastatin 80mg  qHS    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  Sodium corrected with hyperglycemia, in range  Corrected calcium with hypoalbuminemia, in range  - Cr 2.8, b/l 1.5-1.6  - Trend renal function and lytes  - Renally dose medications  - Avoid nephrotoxins  - Consult Nephrology in AM    Poorly controlled T2DM with hyperglycemia, diabetic retinopathy, on long-term insulin  - Continue Lantus at reduced dose 35u BID  - Continue preprandial Lispro at reduced dose 10u TID with meals  - Check BG before meals, at bedtime  - Add PSSI coverage or  uptitrate as warranted  - Check A1c    Neuropathy  Fibromyalgia  OA  - Continue gabapentin, Cymbalta  - Baclofen prn muscle spasms    Anemia of chronic disease  - Hb stable around b/l 9  - Continue folic acid  - Check iron profile, vitamin B12  - Requires Aranesp monthly, occasional Venofer infusions per patient    Asthma, unspecified type  Seasonal and environmental allergies  Left otitis media with effusion  - Continue Dulera (sub Symbicort) scheduled, Albuterol prn  - Continue cetirizine, montelukast, Flonase    Depression/anxiety  - Continue Abilify, bupropion daily  - Continue trazodone for sleep    OSA not on CPAP, noted    Obesity Class 3, BMI>48  - Lifestyle modifications recommended    FEN  Fluid: SL  Electrolytes: Replete to keep K>4, Mag>2  Nutrition: Consistent carb, cardiac diet    Monitoring  Continuous pulse oximetry: no  Telemetry needed: yes  Level of care: n/a    CODE STATUS: full code  Surrogate decision maker or Advanced Care Plan: goddaughter Shannan Harper [phone]    PRIMARY CARE MD: Louann Sjogren, NP    Safety Checklist  DVT prophylaxis: Chemical and / or mechanical ppx NOT indicated or contraindicated: Fully anticoagulated  - Eliquis resumed   Foley: Not present   IVs:  Peripheral IV   PT/OT: Not Ordered   Daily CBC & or Chem ordered: Yes, due to clinical and lab instability       Patient Lines/Drains/Airways Status       Active PICC Line / CVC Line / PIV Line / Drain / Airway / Intraosseous Line / Epidural Line / ART Line / Line / Wound / Pressure Ulcer / NG/OG Tube       Name Placement date Placement time Site Days    Wound 12/05/21 Surgical Incision Arm Right 12/05/21  0837  Arm  59  Rolm Gala, FNP  02/03/2022 2:21 AM

## 2022-02-03 NOTE — Progress Notes (Signed)
Admission Note-  Patient admitted to PCU #  [id number] at 0230.   Patient connected to tele monitor, confirmed with monitor tech.   Patient oriented to room, bed in lowest position, bed alarm activated, call bell within reach.   Fall prevention contract completed. Yes/No  Patient informed of visitor policy.  Belongings at bedside and documented.     Brief Clinical Picture:  Neuro AXO4,RA  Skin Assessment  Two nurse skin assessment performed with: Hannah,RN    Areas observed with redness or injury: None    Devices present:NA   skin integrity under device: Within Normal Limits    Braden score <15. Yes/No    Actions taken: N/A    Any significant admission event NA**

## 2022-02-03 NOTE — UM Notes (Addendum)
PATIENT:  [name]  DOB:         1981/08/30    PRESENTED TO ED: 02/02/2022  9:27 PM    ADMITTED TO CLASS:  Observation   UNIT:  PROGRESSIVE CARE UNIT        Reason For Hospitalization:  41 y.o. 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      ED Triage Vitals   Enc Vitals Group      BP 02/02/22 2112 (!) 196/103      Heart Rate 02/02/22 2112 (!) 102      Resp Rate 02/02/22 2112 18      Temp 02/02/22 2112 99 F (37.2 C)      Temp Source 02/02/22 2112 Oral      SpO2 02/02/22 2112 99 %      Weight 02/02/22 2109 135.4 kg (298 lb 8.1 oz)      Height 02/03/22 0230 1.676 m (5\' 6" )      Head Circumference --       Peak Flow --       Pain Score 02/02/22 2109 9      Pain Loc --       Pain Edu? --       Excl. in GC? --           LABS:    Results       Procedure Component Value Units Date/Time    Glucose Whole Blood - POCT [[id number]]  (Abnormal) Collected: 02/03/22 0837     Updated: 02/03/22 0840     Whole Blood Glucose POCT 322 mg/dL     GFR [[id number]] Collected: 02/03/22 0436     Updated: 02/03/22 0608     EGFR 21.1       Comprehensive metabolic panel [[id number]]  (Abnormal) Collected: 02/03/22 0436    Specimen: Blood Updated: 02/03/22 0608     Glucose 427 mg/dL      BUN 62.9 mg/dL       Creatinine 2.5 mg/dL      Sodium 528 mEq/L      Potassium 4.5 mEq/L      Chloride 105 mEq/L      CO2 18 mEq/L      Calcium 7.8 mg/dL      Protein, Total 4.9 g/dL      Albumin 1.9 g/dL      AST (SGOT) 7 U/L      ALT 8 U/L      Alkaline Phosphatase 96 U/L      Bilirubin, Total 0.1 mg/dL      Globulin 3.0 g/dL      Albumin/Globulin Ratio 0.6     Anion Gap 9.0    Magnesium [[id number]]  (Abnormal) Collected: 02/03/22 0436    Specimen: Blood Updated: 02/03/22 0608     Magnesium 1.5 mg/dL     Phosphorus [[id number]] Collected: 02/03/22 0436  Specimen: Blood Updated: 02/03/22 0608     Phosphorus 4.2 mg/dL     PT/APTT [[id number]]  (Abnormal) Collected: 02/03/22 0436     Updated: 02/03/22 0527     PT 14.0 sec      PT INR 1.2     PTT 41 sec     CBC without differential [[id number]]  (Abnormal) Collected: 02/03/22 0436    Specimen: Blood Updated: 02/03/22 0521     WBC 13.35 x10 3/uL      Hgb 8.4 g/dL      Hematocrit 78.2 %      Platelets 336 x10 3/uL      RBC 3.15 x10 6/uL      MCV 84.4 fL      MCH 26.7 pg      MCHC 31.6 g/dL      RDW 12 %      MPV 10.9 fL      Nucleated RBC 0.0 /100 WBC      Absolute NRBC 0.00 x10 3/uL     Hemoglobin A1C [[id number]] Collected: 02/03/22 0436    Specimen: Blood Updated: 02/03/22 0507    IRON PROFILE [[id number]] Collected: 02/03/22 0436     Updated: 02/03/22 0438    TSH [[id number]] Collected: 02/03/22 0436    Specimen: Blood Updated: 02/03/22 0438    Vitamin B12 [[id number]] Collected: 02/03/22 0436    Specimen: Blood Updated: 02/03/22 0438    Glucose Whole Blood - POCT [[id number]]  (Abnormal) Collected: 02/03/22 0302     Updated: 02/03/22 0308     Whole Blood Glucose POCT 342 mg/dL     High Sensitivity Troponin-I at 2 hrs with calculated Delta [[id number]]  (Abnormal) Collected: 02/03/22 0112    Specimen: Blood Updated: 02/03/22 0142     hs Troponin-I 31.6 ng/L      hs Troponin-I Delta 0.7 ng/L     COVID-19 (SARS-CoV-2) only ([facility]) asymptomatic admission [[id number]] Collected: 02/03/22 0027     Specimen: Nasopharyngeal Updated: 02/03/22 0136     Purpose of COVID testing Screening     SARS-CoV-2 Specimen Source Nasal Swab     SARS CoV 2 Overall Result Not Detected    Narrative:      o Collect and clearly label specimen type:  o PREFERRED-Upper respiratory specimen: One Nasal Swab in  Transport Media.  o Hand deliver to laboratory ASAP  Indication for testing->Extended care facility admission to  semi private room  Screening    NT-proBNP [[id number]]  (Abnormal) Collected: 02/02/22 2306     Updated: 02/02/22 2340     NT-proBNP 572 pg/mL     High Sensitivity Troponin-I at 0 hrs [[id number]]  (Abnormal) Collected: 02/02/22 2306    Specimen: Blood Updated: 02/02/22 2334     hs Troponin-I 30.9 ng/L     Comprehensive metabolic panel [[id number]]  (Abnormal) Collected: 02/02/22 2306    Specimen: Blood Updated: 02/02/22 2328     Glucose 375 mg/dL      BUN 27.0 mg/dL      Creatinine 2.8 mg/dL      Sodium 623 mEq/L      Potassium 4.7 mEq/L      Chloride 103 mEq/L      CO2 20 mEq/L      Calcium 8.0 mg/dL      Protein, Total 5.5 g/dL      Albumin 2.1 g/dL      AST (SGOT) 9 U/L      ALT 9 U/L      Alkaline  Phosphatase 113 U/L      Bilirubin, Total 0.1 mg/dL      Globulin 3.4 g/dL      Albumin/Globulin Ratio 0.6     Anion Gap 11.0    GFR [[id number]] Collected: 02/02/22 2306     Updated: 02/02/22 2328     EGFR 18.5       CBC and differential [[id number]]  (Abnormal) Collected: 02/02/22 2306    Specimen: Blood Updated: 02/02/22 2314     WBC 14.98 x10 3/uL      Hgb 9.0 g/dL      Hematocrit 78.2 %      Platelets 356 x10 3/uL      RBC 3.35 x10 6/uL      MCV 84.2 fL      MCH 26.9 pg      MCHC 31.9 g/dL      RDW 12 %      MPV 10.7 fL      Instrument Absolute Neutrophil Count 9.06 x10 3/uL      Neutrophils 60.4 %      Lymphocytes Automated 29.8 %      Monocytes 6.6 %      Eosinophils Automated 2.1 %      Basophils Automated 0.6 %      Immature Granulocytes 0.5 %      Nucleated RBC 0.0 /100 WBC      Neutrophils Absolute 9.06 x10  3/uL      Lymphocytes Absolute Automated 4.46 x10 3/uL      Monocytes Absolute Automated 0.99 x10 3/uL      Eosinophils Absolute Automated 0.31 x10 3/uL      Basophils Absolute Automated 0.09 x10 3/uL      Immature Granulocytes Absolute 0.07 x10 3/uL      Absolute NRBC 0.00 x10 3/uL                   IMAGING:    [place] VenoDopp Low Extremity Left    Result Date: 02/02/2022   1. Nonocclusive deep venous thrombosis of the LEFT lower extremity mid and distal superficial femoral vein. Critical results were discussed with Dr. Lorene Dy by Dr. Jackey Loge at 11:20 PM on 02/02/2022. Judd Gaudier, MD 02/02/2022 11:30 PM    XR Chest  AP Portable    Result Date: 02/02/2022   Negative for acute process  Gerlene Burdock, MD 02/02/2022 11:15 PM          PLAN:   Nonocclusive DVT of LEFT lower extremity, provoked  Prior h/o PE/DVT, normally chronically on Eliquis  Leukocytosis, likely reactive  - [place] report shows LLE non-occlusive DVT. Right LE patent, no e/o DVT  - Eliquis re-started at 10mg  loading dose for next 7d, followed by 5mg   - Oxycodone 5mg  q6h prn pain  - Leukocytosis likely reactive 2/to DVT as no signs of infection  - Consult Hematology in AM  - Bleeding precautions     Acute on chronic HFpEF  Mildly elevated troponin likely demand ischemia  Suspected cardiorenal syndrome  Uncontrolled HTN  - Echo May 2022: LVEF 50-55%. G1DD. No significant valvular dysfunction.  - BNP mildly elevated, CXR without acute process  - Trop mildly elevated though flat x 2, will check 3rd in 6h  - Single dose furosemide 20mg  IV  - Continue Bumex 2mg  BID  - Continue carvedilol, hydralazine, low dose aspirin  - Hold olmesartan, metolazone due to AKI  - Consider consulting [facility] Cardiology     HLD  - Check lipid profile  -  Continue atorvastatin 80mg  qHS     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  Sodium corrected with hyperglycemia, in range  Corrected calcium with hypoalbuminemia, in range  - Cr 2.8, b/l 1.5-1.6  - Trend renal function and lytes  - Renally dose medications  - Avoid  nephrotoxins  - Consult Nephrology in AM     Poorly controlled T2DM with hyperglycemia, diabetic retinopathy, on long-term insulin  - Continue Lantus at reduced dose 35u BID  - Continue preprandial Lispro at reduced dose 10u TID with meals  - Check BG before meals, at bedtime  - Add PSSI coverage or uptitrate as warranted  - Check A1c     Neuropathy  Fibromyalgia  OA  - Continue gabapentin, Cymbalta  - Baclofen prn muscle spasms     Anemia of chronic disease  - Hb stable around b/l 9  - Continue folic acid  - Check iron profile, vitamin B12  - Requires Aranesp monthly, occasional Venofer infusions per patient     Asthma, unspecified type  Seasonal and environmental allergies  Left otitis media with effusion  - Continue Dulera (sub Symbicort) scheduled, Albuterol prn  - Continue cetirizine, montelukast, Flonase     Depression/anxiety  - Continue Abilify, bupropion daily  - Continue trazodone for sleep     OSA not on CPAP, noted     Obesity Class 3, BMI>48  - Lifestyle modifications recommended     Nutrition: Consistent carb, cardiac diet        Current Facility-Administered Medications   Medication Dose Route Frequency    apixaban  10 mg Oral Q12H SCH    Followed by    Melene Muller ON 02/09/2022] apixaban  5 mg Oral Q12H SCH    ARIPiprazole  10 mg Oral Daily    aspirin  81 mg Oral Daily    atorvastatin  80 mg Oral QHS    budesonide-formoterol  2 puff Inhalation BID    bumetanide  2 mg Oral BID    buPROPion XL  150 mg Oral Daily    carvedilol  25 mg Oral Q12H SCH    cetirizine  5 mg Oral QHS    [START ON 02/04/2022] docusate sodium  100 mg Oral Q12H    DULoxetine  60 mg Oral Daily    fluticasone  2 spray Each Nare Daily    folic acid  1 mg Oral Daily    gabapentin  600 mg Oral BID    hydrALAZINE  50 mg Oral BID    insulin glargine  35 Units Subcutaneous Q12H    insulin lispro  1-4 Units Subcutaneous QHS    insulin lispro  1-8 Units Subcutaneous TID AC    insulin lispro  10 Units Subcutaneous TID AC    magnesium sulfate  1 g  Intravenous Once    montelukast  10 mg Oral QHS    pantoprazole  40 mg Oral QAM AC    traZODone  150 mg Oral QHS             ==========Day 2:  February 03, 2022==========  Vitals:    02/03/22 0030 02/03/22 0230 02/03/22 0320 02/03/22 0837   BP: 187/81 (!) 161/93 146/86 (!) 159/99   Pulse: 86 87 90 91   Resp: 21 18 18 18    Temp: 98.6 F (37 C) 98.6 F (37 C) 97.9 F (36.6 C) 97.5 F (36.4 C)   TempSrc: Oral Oral Oral Oral   SpO2: 100% 99% 99% 98%   Weight:  Height:  1.676 m (5\' 6" )            Current Facility-Administered Medications   Medication Dose Route Frequency    apixaban  10 mg Oral Q12H SCH    Followed by    Melene Muller ON 02/09/2022] apixaban  5 mg Oral Q12H SCH    ARIPiprazole  10 mg Oral Daily    aspirin  81 mg Oral Daily    atorvastatin  80 mg Oral QHS    budesonide-formoterol  2 puff Inhalation BID    bumetanide  2 mg Oral BID    buPROPion XL  150 mg Oral Daily    carvedilol  25 mg Oral Q12H SCH    cetirizine  5 mg Oral QHS    [START ON 02/04/2022] docusate sodium  100 mg Oral Q12H    DULoxetine  60 mg Oral Daily    fluticasone  2 spray Each Nare Daily    folic acid  1 mg Oral Daily    gabapentin  600 mg Oral BID    hydrALAZINE  50 mg Oral BID    insulin glargine  35 Units Subcutaneous Q12H    insulin lispro  1-4 Units Subcutaneous QHS    insulin lispro  1-8 Units Subcutaneous TID AC    insulin lispro  10 Units Subcutaneous TID AC    magnesium sulfate  1 g Intravenous Once    montelukast  10 mg Oral QHS    pantoprazole  40 mg Oral QAM AC    traZODone  150 mg Oral QHS         PER HEME:  1.Left Lower Ext DVT   2.HFrEF  3.h/o PE/DVT  4.CKD3  5. poorly controlled T2DM   6. diabetic retinopathy o  7.DM ON  insulin  8.uncontrolled HTN  9.HLD  10.OSA not on CPAP  11.fibromyalgia  12. OA  13.asthma  14anemia  15.depression/anxiety  16. obesity class 3 (BMI>48  17. 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 **  Athena Baltz "Lisa" Clydene Laming RN, BSN, ACM-RN  Utilization Review Nurse [facility]  [address] [place]  [place], [place] [address]  Phone: [phone]  Main Office Phone: [phone]  Fax: [phone]  Tax ID:  [id number]  NPI:  [id number]

## 2022-02-03 NOTE — Progress Notes (Addendum)
OBS Assessment    Introduced [employer] and self to patient and/or family Educational psychologist (name and contact tel #)   y  Discussed plan of care and possible discharge needs with patient/family/care-giver (Y,     Patient has cognitive ability to participate in care decisions and follow-up care as needed (Y,     Verified (and corrected if needed) demographics and PCP on facesheet (Y,   Assessed for lack of insurance or underinsured. (Y,   If no insurance or underinsured, patient is high risk and needs high risk assessment     Patient is NOT identified as a Medicare focused patient or high risk for failed discharge plan or readmission as detailed in the High Risk Patient Screening policy (Y,     Plan of care: pending pt/ot eval    Discharge Plan A: home with god-daughter Alphonzo Lemmings- [phone]  Discharge Plan B: tbd    Expected discharge date: tbd    Barriers to discharge:              a) Barriers needing escalation: none at this time              b) Escalated to:       Tentative d/c date and CM # on white board:    Comments:   This case manager conducted a telephonic assessment with patient/ to review information and discuss discharge planning.   Patient/ verified name,date of birth, address, emergency contacts, PCP provider and insurance.    Patient lives with god-daughter Alphonzo Lemmings who is a CNA that helps patient with ADLs- Alphonzo Lemmings works during the night and helps patient during the day.  Patient stated she needs a w/c and a nebulizer  Upon d/c patient daughter Ladona Mow will transport patient home  Case Management will continue to monitor patient's progression of care, d/c needs and possible barriers to discharge.

## 2022-02-03 NOTE — Consults (Signed)
[facility] CONSULTATION    Date Time: 02/03/22 11:23 AM  Patient Name: [name]  Requesting Physician: Betsey Holiday, MD  Admission Date: 02/02/2022   Primary Cardiologist:  Josiah Lobo, MD  [facility])    Reason for Consultation:   Acute on chronic HF  History:   Shahidah Nesbitt is a 41 y.o. female with a history of HFpEF (Echo 03/2021 grade 1 diastolic dysfunction, LVEF 50 to 55%), HTN, HLD, DVT/PE (2021), CKD, OSA, vertigo, anemia, and asthma who presented to the hospital on 02/02/2022 with left leg pain and back pain.  Patient had right hand surgery on 12/06/2021.  She had held her Eliquis for 1 week prior to surgery and 4 days after surgery.  It was stopped again 3 weeks ago due to poor hand healing/bleeding.  Bilateral lower extremity dopplers this admission showed nonocclusive DVT of the left lower extremity mid and distal superficial femoral vein.    On admission, troponin 31.6>19.2.  NT proBNP 572.  CXR negative for acute process.  EKG - SR, 80 bpm.  Nonspecific ST and T wave abnormality.  Patient denies chest pain, shortness of breath, and orthopnea.  She has a history of vertigo and states she is always dizzy.    Home medications include: Bumex 2 mg BID, carvedilol 25 mg twice daily, hydralazine 75 mg twice daily, olmesartan 40 mg daily.  She is noted to be intolerant to amlodipine in the past.  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 She has a past surgical history that includes Hysterectomy (12/2019); Cesarean section; Cholecystectomy; ABLATION ENDOMETRIAL; Eye surgery (Bilateral); APPENDECTOMY (OPEN); UTERINE RUPTURE;  and REPAIR, UPPER EXTREMITY, TENDON & NERVE (Right, 12/05/2021).  Medications:   Home Meds:   Medications Prior to Admission   Medication Sig    buPROPion XL (WELLBUTRIN XL) 150 MG 24 hr tablet Take 150 mg by mouth daily    albuterol sulfate HFA (PROVENTIL) 108 (90 Base) MCG/ACT inhaler Inhale 2 puffs into the lungs every 4 (four) hours as needed for Wheezing    Alcohol Swabs Pads Use as indicated for insulin injections    apixaban (ELIQUIS) 5 MG Take 5 mg by mouth every 12 (twelve) hours    ARIPiprazole (ABILIFY) 5 MG tablet Take 10 mg by mouth daily    aspirin 81 MG chewable tablet Chew 81 mg by mouth daily    atorvastatin (LIPITOR) 40 MG tablet Take 80 mg by mouth nightly    Azelastine-Fluticasone 137-50 MCG/ACT Suspension 1 spray by Nasal route 2 (two) times daily    baclofen (LIORESAL) 10 MG tablet Take 10 mg by mouth as needed    bumetanide (BUMEX) 2 MG tablet Take 1 tablet (2 mg total) by mouth 2 (two) times daily    carvedilol (COREG) 25 MG tablet Take 25 mg by mouth 2 (two) times daily    cetirizine (ZyrTEC) 5 MG tablet Take 5 mg by mouth daily    diclofenac (VOLTAREN) 50 MG EC tablet Take 50 mg by mouth daily    docusate sodium (COLACE) 100 MG capsule Take 100 mg by mouth nightly    Dulera 200-5 MCG/ACT Aerosol Inhale 2 puffs into the lungs daily    DULoxetine (CYMBALTA) 60 MG capsule Take 60 mg by  mouth daily    EPINEPHrine 0.15 MG/0.3ML injection Inject 0.15 mg into the muscle as needed    folic acid (FOLVITE) 1 MG tablet Take 1 mg by mouth daily    gabapentin (NEURONTIN) 800 MG tablet Take 800 mg by mouth 3 (three) times daily    hydrALAZINE (APRESOLINE) 50 MG tablet Take 50 mg by mouth 2 (two) times daily    hydrOXYzine (ATARAX) 10 MG tablet Take 10 mg by mouth every 6 (six) hours as needed for Anxiety    insulin detemir (Levemir) 100 UNIT/ML injection Inject 50 Units into the skin every 12 (twelve) hours (Patient taking differently: Inject 53 Units into the skin every 12 (twelve) hours)    insulin  lispro (HumaLOG) 100 UNIT/ML injection Inject 25 Units into the skin 3 (three) times daily before meals (Patient taking differently: Inject 25 Units into the skin 3 (three) times daily before meals)    Insulin Syringe-Needle U-100 27G X 1/2" 1 ML Misc Inject 1 Dose into the skin 4 (four) times daily    lidocaine (LIDODERM) 5 % Place 1 patch onto the skin every 24 hours Remove & Discard patch within 12 hours or as directed by MD    meclizine (ANTIVERT) 25 MG tablet Take 25 mg by mouth as needed    metOLazone (ZAROXOLYN) 10 MG tablet Take 10 mg by mouth daily    montelukast (SINGULAIR) 10 MG tablet Take 10 mg by mouth nightly    olmesartan (BENICAR) 40 MG tablet Take 40 mg by mouth daily    omeprazole (PriLOSEC) 40 MG capsule Take 40 mg by mouth nightly    Semaglutide,0.25 or 0.5MG /DOS, (Ozempic, 0.25 or 0.5 MG/DOSE,) 2 MG/1.5ML Solution Pen-injector Inject into the skin    topiramate (TOPAMAX) 100 MG tablet Take 100 mg by mouth daily    traZODone (DESYREL) 150 MG tablet Take 150 mg by mouth    Trulicity 1.5 MG/0.5ML Solution Pen-injector Inject 1.5 mg into the skin Once each week on Monday    valACYclovir (VALTREX) 1000 MG tablet Take 1 g by mouth as needed      Scheduled Meds:     apixaban, 10 mg, Oral, Q12H SCH   Followed by  Melene Muller ON 02/09/2022] apixaban, 5 mg, Oral, Q12H SCH  ARIPiprazole, 10 mg, Oral, Daily  aspirin, 81 mg, Oral, Daily  atorvastatin, 80 mg, Oral, QHS  budesonide-formoterol, 2 puff, Inhalation, BID  bumetanide, 2 mg, Oral, BID  buPROPion XL, 150 mg, Oral, Daily  butalbital-acetaminophen-caffeine, 1 tablet, Oral, Once  carvedilol, 25 mg, Oral, Q12H SCH  cetirizine, 5 mg, Oral, QHS  diphenhydrAMINE, 12.5 mg, Intravenous, Once  [START ON 02/04/2022] docusate sodium, 100 mg, Oral, Q12H  DULoxetine, 60 mg, Oral, Daily  fluticasone, 2 spray, Each Nare, Daily  folic acid, 1 mg, Oral, Daily  gabapentin, 600 mg, Oral, BID  hydrALAZINE, 50 mg, Oral, BID  insulin glargine, 35 Units, Subcutaneous,  Q12H  insulin lispro, 1-4 Units, Subcutaneous, QHS  insulin lispro, 1-8 Units, Subcutaneous, TID AC  insulin lispro, 10 Units, Subcutaneous, TID AC  montelukast, 10 mg, Oral, QHS  pantoprazole, 40 mg, Oral, QAM AC  traZODone, 150 mg, Oral, QHS      Continuous Infusions:      PRN Meds:  acetaminophen **OR** acetaminophen, albuterol sulfate HFA, baclofen, dextrose **OR** dextrose **OR** dextrose **OR** glucagon (rDNA), hydrOXYzine, naloxone, oxyCODONE, polyethylene glycol, promethazine **OR** promethazine    Physical Exam:   Vital Signs: BP (!) 159/99   Pulse 91   Temp 97.5  F (36.4 C) (Oral)   Resp 18   Ht 1.676 m (5\' 6" )   Wt 135.4 kg (298 lb 8.1 oz)   SpO2 98%   BMI 48.18 kg/m      Intake/Output Summary (Last 24 hours) at 02/03/2022 0700  Last data filed at 02/03/2022 0500  Gross per 24 hour   Intake 240 ml   Output --   Net 240 ml     General: awake, alert, no acute distress.  Cardiovascular: regular rate and rhythm, no murmurs, HJR+  Lungs: clear to auscultation bilaterally  Abdomen: soft, non-tender, non-distended  Extremities: trace to 1+ BLE edema   Cardiographics:   ECG: SR, 80 bpm.  Nonspecific ST and T wave abnormality.  Telemetry: Sinus rhythm, 71 bpm.  Episode of bradycardia while vomiting with heart rate down to 41 bpm.  Second pause.  Echocardiogram: 03/28/2021  Summary    * Left ventricular wall thickness is mildly increased.    * Left ventricular ejection fraction is lower limit of normal with an  estimated ejection fraction of 50-55%.    * Left ventricular diastolic filling parameters are consistent with Grade I  diastolic dysfunction (impaired relaxation pattern).    * No significant valvular dysfunction.    * mildly dilated aortic root and ascending aorta, and in normal range when  indexed to body surface area.    30-day cardiac monitor 11/05/2021 to 11/08/2021  Predominant underlying rhythm was sinus   No atrial fibrillation, flutter, pauses, AV block, SVT, VT   Rare PACs and PVCs   No  patient recorded events   Nuclear stress test: None  Catheterization: None  Labs Reviewed:     Recent Labs     02/03/22  0436 02/02/22  2306   WBC 13.35* 14.98*   Hgb 8.4* 9.0*   Hematocrit 26.6* 28.2*   Platelets 336 356*     Recent Labs     02/03/22  1041 02/03/22  0112 02/02/22  2306   hs Troponin-I 19.2* 31.6* 30.9*   hs Troponin-I Delta  --  0.7  --    NT-proBNP  --   --  572*    Recent Labs     02/03/22  0436 02/02/22  2306   Sodium 132* 134*   Potassium 4.5 4.7   CO2 18 20   BUN 46.0* 44.0*   Creatinine 2.5* 2.8*   Glucose 427* 375*   Magnesium 1.5*  --     Estimated Creatinine Clearance: 41.5 mL/min (A) (based on SCr of 2.5 mg/dL (H)).   Recent Labs     02/03/22  0436   PT INR 1.2*   TSH 2.94        Rads:     Radiology Results (24 Hour)       Procedure Component Value Units Date/Time    [place] VenoDopp Low Extremity Left [[id number]] Collected: 02/02/22 2322    Order Status: Completed Updated: 02/02/22 2332    Narrative:      HISTORY: Left lower extremity edema and pain.    COMPARISON: None available.    TECHNIQUE: Wallace Cullens scale, color flow, and spectral Doppler waveform  analysis was performed on the LEFT lower extremity veins described  below. There is normal compressibility, phasic flow, and response to  augmentation unless otherwise noted.    FINDINGS: Limited exam secondary to patient body habitus.    LEFT LOWER EXTREMITY:  Common femoral vein: Normal  Deep femoral vein (proximal portion): Normal  Greater saphenous vein at  the saphenofemoral junction: Normal    Femoral vein (proximal): Normal  Femoral vein (mid): Partially compressible, nonocclusive thrombus.  Femoral vein (distal): Partially compressible, nonocclusive thrombus.    Popliteal vein: Normal    Peroneal veins: Normal  Posterior tibial veins: Normal    Soft tissues: No Baker's cyst identified.    The right common femoral vein (imaged per protocol) is patent.        Impression:           1. Nonocclusive deep venous thrombosis of the LEFT lower  extremity mid  and distal superficial femoral vein.    Critical results were discussed with Dr. Lorene Dy by Dr. Jackey Loge at 11:20  PM on 02/02/2022.    Judd Gaudier, MD  02/02/2022 11:30 PM    XR Chest  AP Portable [[id number]] Collected: 02/02/22 2314    Order Status: Completed Updated: 02/02/22 2317    Narrative:      HISTORY:  Edema    COMPARISON: 08/05/2021    FINDINGS: Cardiac silhouette is borderline enlarged. Pulmonary  vasculature is within normal limits. No consolidation or pleural  effusion is identified. Hila are unremarkable.      Impression:       Negative for acute process         Gerlene Burdock, MD  02/02/2022 11:15 PM          Assessment:   Shirel Mallis is a 41 y.o. female with a history of HFpEF (Echo 03/2021 grade 1 diastolic dysfunction, LVEF 50 to 55%), HTN, HLD, DVT/PE (2021), CKD, OSA, vertigo, anemia, and asthma who presented to the hospital on 02/02/2022 with left leg pain.  Patient had right hand surgery on 12/06/2021.  She had held her Eliquis for 1 week prior to surgery and 4 days after surgery.  It was stopped again 3 weeks ago due to poor hand healing/bleeding.  Bilateral lower extremity dopplers this admission showed nonocclusive DVT of the left lower extremity mid and distal superficial femoral vein.    Acute on chronic HFpEF.  Mild, NT proBNP 572.   It appears mildly volume overloaded on exam.  She received IV Lasix 20 mg at 0314 this am.  Could be related to worsening renal function.  Left leg DVT. Nonocclusive DVT of the left lower extremity mid and distal superficial femoral vein.  AKI on CKD.  Baseline creatinine 1.6.  Mildly elevated hs troponin.  Peaked at 31.6.  Denies chest pain. Likely related to kidney failure and demand.   History of DVT/PE (2021).  Patient was on Eliquis 5 mg twice daily with interruption for hand surgery in January 2023 and discontinuation 3 weeks ago due to nonhealing left hand.  HTN.  Not well controlled carvedilol 25 mg twice daily, hydralazine 50 mg twice  daily, and olmesartan.   HLD.  On atorvastatin 80 mg.  OSA  Vertigo  Anemia.  Hgb 8.4.   Hypomagnesia.  Magnesium 1.5.  Replaced.  Hyponatremia.  Sodium  on admit 132.  Resolved.   Asthma  DM type II.  Insulin-dependent.  Hemoglobin A1c (02/03/2022) 9.4.  History of dilated ascending aorta.     Plan:   Diuresis per nephrology .  She appears volume overloaded and would benefit from IV diuretics for today. If OK with nephrology suggest to give another dose of IV lasix 20 mg today and reassess the kidney function and volume status tomorrow to adjust the diuretic dosage.    Strict I/Os, standing weights, replete electrolytes with goal K>4 and  Mg.2.    Increase hydralazine to 75 mg BID (home dose). BP goal <130/80.    Continue carvedilol 25 mg twice daily.  Continue to hold olmesartan due to AKI.  Continue atorvastatin 80 mg daily and ASA 81 mg daily.  DVT treatment per primary team.  Eliquis 10 mg twice daily for 1 week, then 5 mg twice daily. Monitior Hgb closely.     I saw and examined the patient and spent 35 minutes providing care on the day of service including time spent in chart and data review, documentation, ordering, speaking with the patient and family, and communicating with the care team.    Signed by: Netta Neat, FNP  Attending Addendum:  I have personally seen and examined this patient and have participated in the care. I agree with the clinical information, as documented by the NP. I have edited this note to reflect my findings and plan as well as to incorporate any new data. I have personally conducted the physical exam in its entirety.  I have performed the substantive portion of this visit and spent 50 minutes, which is more than 50% of the total time.         Signed by: Craig Staggers, MD, MPH, [facility]   [place] [facility] [employer] weekday, [phone]  (M-F 8 am-5 pm)  [facility] weekend, [phone] or [phone] (weekends 8 am-5 pm)    [facility] [phone] or [phone] (8 am-5 pm)   After Hours Non-Urgent Consult Voicemail for [facility]: [phone]   After Hours On-Call Physician: [phone]               LO:VFIEP, Leida Lauth, NP

## 2022-02-03 NOTE — Progress Notes (Signed)
[facility]  [facility]ESS [facility]      Patient: [name]  Date: 02/03/2022   LOS: 0 Days  Admission Date: 02/02/2022   MRN: [id number]  Attending:  MD     ASSESSMENT/PLAN     Lynn Marinesha Talley Sonnenberg is a 41 y.o. 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 Right LE patent, no e/o DVT  - Eliquis re-started at 10mg  loading dose for next 7d, followed by 5mg   - Hypercoagulable workup as per heme as outpt  - Will need repeat doppler in 6 weeks to monitor clot  - Oxycodone 5mg  q6h prn pain  - Leukocytosis likely reactive 2/to DVT as no signs of infection     Acute on chronic HFpEF  Mildly elevated troponin likely demand ischemia  Suspected cardiorenal syndrome  Uncontrolled HTN  - Echo May 2022: LVEF 50-55%. G1DD. No significant valvular dysfunction.  - BNP mildly elevated, CXR without acute process  - Trop mildly elevated though flat x 2, will check 3rd in 6h  - Single dose furosemide 20mg  IV last night, cardiology would like to give another dose of IV lasix today, d/w nephro they are ok with this plan as well  - Continue Bumex 2mg  BID  - Continue carvedilol, hydralazine, low dose aspirin  - Hold olmesartan, metolazone due to AKI  - Appreciate cardiology input     HLD  - Check lipid profile  - Continue atorvastatin 80mg  qHS     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  Sodium corrected with hyperglycemia, in range  Corrected calcium with hypoalbuminemia, in range  - Cr 2.5 today, monitor in AM  Nephrology consulted, appreciate input  - Trend renal function and lytes  - Renally dose medications, gabapentin dose reduced for renal fx  - may be NSAID induced, as pt states she was using Ibuprofen for past 2 weeks after her carpal tunnel surgery       Poorly controlled T2DM with hyperglycemia, diabetic retinopathy,  on long-term insulin  - Continue Lantus at reduced dose 35u BID  - Continue preprandial Lispro at reduced dose 10u TID with meals  - Check BG before meals, at bedtime  - Add PSSI coverage or uptitrate as warranted  - Check A1c     Neuropathy  Fibromyalgia  OA  - Continue gabapentin, Cymbalta  - Baclofen prn muscle spasms     Anemia of chronic disease  - Hb stable around b/l 9  - Continue folic acid  - Check iron profile, vitamin B12  - Requires Aranesp monthly, occasional Venofer infusions per patient     Asthma, unspecified type  Seasonal and environmental allergies  Left otitis media with effusion  - Continue Dulera (sub Symbicort) scheduled, Albuterol prn  - Continue cetirizine, montelukast, Flonase     Depression/anxiety  - Continue Abilify, bupropion daily  - Continue trazodone for sleep     OSA not on CPAP, noted     Obesity Class 3, BMI>48  - Lifestyle modifications recommended    Near syncope  - small pause today during episode of vomitus, where she became bradycardic  - likely vasovagal response w/ reversal of bradycardia once vomiting stopped  - cont to monitor on tele       Analgesia: oxycodone    Nutrition: diabetic/cardiac  Safety Checklist  DVT prophylaxis: Chemical and / or mechanical ppx NOT indicated or contraindicated: Fully anticoagulated    Foley: Not present   IVs:  Peripheral IV   PT/OT: Not needed   Daily CBC & or Chem ordered: Yes, due to clinical and lab instability       Patient Lines/Drains/Airways Status       Active PICC Line / CVC Line / PIV Line / Drain / Airway / Intraosseous Line / Epidural Line / ART Line / Line / Wound / Pressure Ulcer / NG/OG Tube       Name Placement date Placement time Site Days    Peripheral IV 02/02/22 22 G Posterior;Right Hand 02/02/22  2300  Hand  less than 1    Wound 12/05/21 Surgical Incision Arm Right 12/05/21  0837  Arm  60                    MD/RN rounds: yes       Code Status: full code    DISPO: home with family    Family Contact: per  chart    Care Plan discussed with nursing, consultants, case manager.       SUBJECTIVE     [name] states she has a headache, and feels nauseous this morning    MEDICATIONS     Current Facility-Administered Medications   Medication Dose Route Frequency    apixaban  10 mg Oral Q12H SCH    Followed by    Melene Muller ON 02/09/2022] apixaban  5 mg Oral Q12H SCH    ARIPiprazole  10 mg Oral Daily    aspirin  81 mg Oral Daily    atorvastatin  80 mg Oral QHS    budesonide-formoterol  2 puff Inhalation BID    bumetanide  2 mg Oral BID    buPROPion XL  150 mg Oral Daily    carvedilol  25 mg Oral Q12H SCH    cetirizine  5 mg Oral QHS    diphenhydrAMINE  12.5 mg Intravenous Once    [START ON 02/04/2022] docusate sodium  100 mg Oral Q12H    DULoxetine  60 mg Oral Daily    fluticasone  2 spray Each Nare Daily    folic acid  1 mg Oral Daily    gabapentin  300 mg Oral BID    hydrALAZINE  75 mg Oral Q12H    insulin glargine  35 Units Subcutaneous Q12H    insulin lispro  1-4 Units Subcutaneous QHS    insulin lispro  1-8 Units Subcutaneous TID AC    insulin lispro  10 Units Subcutaneous TID AC    iron sucrose  200 mg Intravenous Q24H SCH    montelukast  10 mg Oral QHS    pantoprazole  40 mg Oral QAM AC    traZODone  150 mg Oral QHS    vitamin D (ergocalciferol)  50,000 Units Oral Weekly       ROS     Remainder of 10 point ROS as above or otherwise negative    PHYSICAL EXAM     Vitals:    02/03/22 1553   BP: 133/89   Pulse: 80   Resp: 18   Temp: 98 F (36.7 C)   SpO2: 97%       Temperature: Temp  Min: 97.5 F (36.4 C)  Max: 99 F (37.2 C)  Pulse: Pulse  Min: 80  Max: 102  Respiratory: Resp  Min: 17  Max: 21  Non-Invasive BP: BP  Min: 116/76  Max: 196/103  Pulse Oximetry SpO2  Min: 96 %  Max: 100 %    Intake and Output Summary (Last 24 hours) at Date Time    Intake/Output Summary (Last 24 hours) at 02/03/2022 1558  Last data filed at 02/03/2022 0500  Gross per 24 hour   Intake 240 ml   Output --   Net 240 ml       GEN APPEARANCE:  Normal;  A&OX3, obese  HEENT: PERLA; EOMI; Conjunctiva Clear  NECK: Supple; No bruits  CVS: RRR, S1, S2; No M/G/R  LUNGS: CTAB; No Wheezes; No Rhonchi: No rales  ABD: Soft; No TTP; + Normoactive BS  EXT: trace b/l LE edema; Pulses 2+ and intact, normal capillary refill  SKIN: No rash or Lesions, scar on right hand from carpal tunnel surgery  NEURO: CN 2-12 intact; No Focal neurological deficits      LABS     Recent Labs   Lab 02/03/22  0436 02/02/22  2306   WBC 13.35* 14.98*   RBC 3.15* 3.35*   Hgb 8.4* 9.0*   Hematocrit 26.6* 28.2*   MCV 84.4 84.2   Platelets 336 356*       Recent Labs   Lab 02/03/22  1048 02/03/22  0436 02/02/22  2306   Sodium 136 132* 134*   Potassium 4.3 4.5 4.7   Chloride 107 105 103   CO2 20 18 20    BUN 47.0* 46.0* 44.0*   Creatinine 2.4* 2.5* 2.8*   Glucose 225* 427* 375*   Calcium 8.1* 7.8* 8.0*   Magnesium 1.8 1.5*  --        Recent Labs   Lab 02/03/22  0436 02/02/22  2306   ALT 8 9   AST (SGOT) 7 9   Bilirubin, Total 0.1* 0.1*   Albumin 1.9* 2.1*   Alkaline Phosphatase 96 113       Recent Labs   Lab 02/03/22  1041 02/03/22  0112   hs Troponin-I 19.2* 31.6*   hs Troponin-I Delta  --  0.7       Recent Labs   Lab 02/03/22  0436   PT INR 1.2*   PT 14.0*   PTT 41*       Microbiology Results (last 15 days)       Procedure Component Value Units Date/Time    COVID-19 (SARS-CoV-2) only (Liat Rapid) asymptomatic admission [[id number]] Collected: 02/03/22 0027    Order Status: Completed Specimen: Nasopharyngeal Updated: 02/03/22 0136     Purpose of COVID testing Screening     SARS-CoV-2 Specimen Source Nasal Swab     SARS CoV 2 Overall Result Not Detected     Comment: __________________________________________________  -A result of "Detected" indicates POSITIVE for the    presence of SARS CoV-2 RNA  -A result of "Not Detected" indicates NEGATIVE for the    presence of SARS CoV-2 RNA  __________________________________________________________  Test performed using the Roche cobas Liat SARS-CoV-2 assay.  This assay is  only for use under the Food and Drug Administration's Emergency Use  Authorization. This is a real-time RT-PCR assay for the qualitative  detection of SARS-CoV-2 RNA. Viral nucleic acids may persist in vivo,  independent of viability. Detection of viral nucleic acid does not imply the  presence of infectious virus, or that virus nucleic acid is the cause of  clinical symptoms. Negative results do not preclude SARS-CoV-2 infection and  should not be used  as the sole basis for diagnosis, treatment or other  patient management decisions. Negative results must be combined with  clinical observations, patient history, and/or epidemiological information.  Invalid results may be due to inhibiting substances in the specimen and  recollection should occur. Please see Fact Sheets for patients and providers  located:  WirelessDSLBlog.no         Narrative:      o Collect and clearly label specimen type:  o PREFERRED-Upper respiratory specimen: One Nasal Swab in  Transport Media.  o Hand deliver to laboratory ASAP  Indication for testing->Extended care facility admission to  semi private room  Screening             RADIOLOGY     Radiological Procedure xray personally reviewed and concur with radiologist reports unless stated otherwise.    XR Chest  AP Portable   Final Result    Negative for acute process             Gerlene Burdock, MD   02/02/2022 11:15 PM      [place] VenoDopp Low Extremity Left   Final Result          1. Nonocclusive deep venous thrombosis of the LEFT lower extremity mid   and distal superficial femoral vein.      Critical results were discussed with Dr. Lorene Dy by Dr. Jackey Loge at 11:20   PM on 02/02/2022.      Judd Gaudier, MD   02/02/2022 11:30 PM          Signed,  Zacarias Pontes PA-C  3:58 PM 02/03/2022     This note was generated by the [facility] EMR system/Dragon speech recognition and may contain inherent errors or omissions not intended by the user. Grammatical errors, random  word insertions, deletions, pronoun errors and incomplete sentences are occasional consequences of this technology due to software limitations. Not all errors are caught or corrected. If there are questions or concerns about the content of this note or information contained within the body of this dictation they should be addressed directly with the author for clarification.

## 2022-02-03 NOTE — Plan of Care (Signed)
Problem: Moderate/High Fall Risk Score >5  Goal: Patient will remain free of falls  Outcome: Progressing  Flowsheets  Taken 02/03/2022 1416  VH Moderate Risk (6-13): ALL REQUIRED LOW INTERVENTIONS  Taken 02/03/2022 0900  Moderate Risk (6-13):   MOD-Apply bed exit alarm if patient is confused   MOD-Consider a move closer to [employer]   MOD-Remain with patient during toileting     Problem: Venous Thrombotic Event  Goal: Knowledge deficit/VTE Prevention  Outcome: Progressing  Flowsheets (Taken 02/03/2022 0302 by Lanna Poche, RN)  Knowledge deficit/ VTE Prevention:   Reinforce signs/symptoms to report to physician   Reinforce personal safety measures while on anticoagulant therapy   Review medications: to include dose, frequency, drug interactions and side effects   Discuss medications to avoid without physician approval   Discuss importance of follow up lab studies as directed by physician if discharged on anticoagulants     Problem: Pain interferes with ability to perform ADL  Goal: Pain at adequate level as identified by patient  Outcome: Progressing  Flowsheets (Taken 02/03/2022 1416)  Pain at adequate level as identified by patient:   Identify patient comfort function goal   Assess for risk of opioid induced respiratory depression, including snoring/sleep apnea. Alert healthcare team of risk factors identified.   Assess pain on admission, during daily assessment and/or before any "as needed" intervention(s)   Reassess pain within 30-60 minutes of any procedure/intervention, per Pain Assessment, Intervention, Reassessment (AIR) Cycle   Evaluate if patient comfort function goal is met   Evaluate patient's satisfaction with pain management progress   Offer non-pharmacological pain management interventions   Consult/collaborate with Pain Service   Consult/collaborate with Physical Therapy, Occupational Therapy, and/or Speech Therapy   Include patient/patient care companion in decisions related to pain management  as needed

## 2022-02-03 NOTE — OT Eval Note (Signed)
Occupational Therapy Eval - [facility]  Patient: [name]    Bed: Z610/R604-54    Post Acute Care Therapy Recommendations:   Discharge Recommendation: Home with supervision    DME Recommended for Discharge: Front wheel walker, Shower chair     Assessment:   Pt is M/I w/ ADLs and mobility w/ DME/AE.  No con't OT needs.   Discharge from therapy.    Interdisciplinary Communication   Patient is in bed and call bell/bed alarm set within reach.  Spoke with RN regarding request for pain meds.    Plan:   Discharge from Occupational Therapy.    Evaluation:     Medical Diagnosis: AKI (acute kidney injury) [N17.9]    History of Present Illness: [name] is a 41 y.o. female admitted on 02/02/2022 with LLE pain.  + DVT.  Pt started on [facility] yesterday.     Patient Active Problem List   Diagnosis    Acute renal failure superimposed on stage 3 chronic kidney disease    Drug-induced hyperglycemia    Peripheral edema    CHF NYHA class III, acute, combined    Morbid obesity    Hypertension    Fibromyalgia    Diabetic neuropathy associated with type 2 diabetes mellitus    Type II diabetes mellitus, uncontrolled    Type 2 diabetes mellitus with diabetic chronic kidney disease    AKI (acute kidney injury)        Past Medical/Surgical History:  Past Medical History:   Diagnosis Date    Anemia     Arthritis     Asthma     Chronic kidney disease     Stage III    Congestive heart failure     Diabetes mellitus     Fibromyalgia     Gastroesophageal reflux disease     Hyperlipidemia     Hypertension     Neuropathy     Pulmonary embolism 08/2019    Renal insufficiency     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     Scoliosis     Sleep apnea     no cpap    Type 2 diabetes mellitus, controlled     Vertigo       Past Surgical History:   Procedure Laterality Date    ABLATION ENDOMETRIAL      APPENDECTOMY (OPEN)      CESAREAN SECTION      x 2    CHOLECYSTECTOMY      EYE SURGERY Bilateral     cataract    HYSTERECTOMY  12/2019     REPAIR, UPPER EXTREMITY, TENDON & NERVE Right 12/05/2021    Procedure: REPAIR, RIGHT ULNAR NERVE AND NEUROPLASTY @ ELBOW AND WRIST, RIGHT OPEN CARPAL TUNNEL RELEASE;  Surgeon: Cathrine Muster, MD;  Location: [facility];  Service: Plastics;  Laterality: Right;    UTERINE RUPTURE           Social History/Prior level of function:  Prior Level of Function  Prior level of function: Ambulates independently;Needs assistance with ADLs  Baseline Activity Level: Household ambulation  Dressing - Upper Body: independent  Dressing - Lower Body:  (w/ assist)  Cooking: light meal prep  Feeding: independent  Bathing: supervision  Grooming: independent  Toileting: independent  Home Living Arrangements  Living Arrangements:  (God daugther who works as a Lawyer)  Type of Home: [employer] Layout: One level (They live on basement floor apt in house w/ no  stairs to enter)  Bathroom Shower/Tub: Pension scheme manager: Standard    Orientation/Cognition: A&O x 4    Pain: 6/10 - RN notified for meds    Gross UE ROM: WFL    Gross UE strength: L shldr 4/5  R hand 4/5 - recent carpal tunnel sx (12/06/21).  She is to start OP therapy next week.    ADLs: min A LB. Ind toileting    Functional mobility: Ind bed mobility, sit-stand.  Pt states she falls at home due to neuropathy.  Used RW, able to use M/I in room.  Will need ongoing assessment w/ PT for safety and need for home.      Patient Goal: Safety w/ walking  Goals  Patient will dress lower body: Modified Independent;with AE  Pt will transfer bed to toilet: Modified Independent;with rolling walker        Treatment: Educated pt and god daughter on AE for LB ADLs.  She demo good use of reacher and sock aid.  Educated on fine motor exercises for hand that don't use resistance.  Also educated on shower chairs for home use.   Minutes: 20 min    Signature: Kern Alberta, OT

## 2022-02-03 NOTE — Consults (Signed)
CONSULTATION    Date Time: 02/03/22 1:09 PM  Patient Name: [name]  Requesting Physician: Betsey Holiday, MD      Reason for Consultation:    Nonocclusive deep venous thrombosis of the LEFT lower extremity mid and distal superficial femoral Vein     Assessment:   1.Left Lower Ext DVT   2.HFrEF  3.h/o PE/DVT  4.CKD3  5. poorly controlled T2DM   6. diabetic retinopathy o  7.DM ON  insulin  8.uncontrolled HTN  9.HLD  10.OSA not on CPAP  11.fibromyalgia  12. OA  13.asthma  14anemia  15.depression/anxiety  16. obesity class 3 (BMI>48  17. 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 female who presents to the hospital on 02/02/2022 with concerns of feeling  unwell over the past week. She had posterior HA radiating into her neck, with achiness in her upper and mid back and shoulders. She had some left ear pain, "bronchitis type coughing," intermittent sweats like she might have fever, higher than usual blood pressures and blood sugars with feeling more tired. She also reports chronic nausea, but denies emesis, diarrhea, chest pain, SOB, abdominal pain, urinary symptoms. She became most concerned, however, when she developed worsening posterior left knee and thigh pain that reminded her of when she had DVT and PE in 2020, for which she had chronically been on Eliquis. She states she resumed Eliquis as advised post-operatively, but then stopped Eliquis 3 weeks ago after the incision on her right hand from her carpal tunnel release surgery 12/06/21 would not close, kept bleeding, and got infected. She was placed on Keflex and the incision healed, but she did not know when to resume  Eliquis.  In ED,   Labs remarkable for elevated WBC 14.98, Hb 9 at baseline, mildly elevated PLT  Cr 2.8 with BUN 44.   Glucose 375. Mild hyponatremia 134. Hypocalcemia 8. Albumin 2.1  UA +glucose, prot  Hs Trop mildly elevated 30.9, likely demand ischemia. BNP 572  CXR without acute process  [place] BLE: Nonocclusive DVT of LEFT lower extremity mid and distal superficial femoral vein.  Echo May 2022: LVEF 50-55%. G1DD. No significant valvular dysfunction.hematology was consulted for further [facility], cont to have pain in the leg, no Wt loss, no other Gi pr GI symptoms        Past Medical History:     Past Medical History:   Diagnosis Date    Anemia     Arthritis     Asthma     Chronic kidney disease     Stage III    Congestive heart failure     Diabetes mellitus     Fibromyalgia     Gastroesophageal reflux disease     Hyperlipidemia     Hypertension     Neuropathy     Pulmonary embolism 08/2019    Renal insufficiency     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     Scoliosis     Sleep apnea     no cpap    Type  2 diabetes mellitus, controlled     Vertigo        Past Surgical History:     Past Surgical History:   Procedure Laterality Date    ABLATION ENDOMETRIAL      APPENDECTOMY (OPEN)      CESAREAN SECTION      x 2    CHOLECYSTECTOMY      EYE SURGERY Bilateral     cataract    HYSTERECTOMY  12/2019    REPAIR, UPPER EXTREMITY, TENDON & NERVE Right 12/05/2021    Procedure: REPAIR, RIGHT ULNAR NERVE AND NEUROPLASTY @ ELBOW AND WRIST, RIGHT OPEN CARPAL TUNNEL RELEASE;  Surgeon: Cathrine Muster, MD;  Location: [facility];  Service: Plastics;  Laterality: Right;    UTERINE RUPTURE         Family History:     Family History   Problem Relation Age of Onset    Heart disease Mother     Hypertension Mother     Diabetes Mother     Asthma Father     Hypertension Father     Diabetes Daughter     Asthma Son     Cancer Maternal Aunt     Cancer Maternal Grandmother        Social History:     Social History     Socioeconomic History    Marital  status: Single     Spouse name: Not on file    Number of children: Not on file    Years of education: Not on file    Highest education level: Not on file   Occupational History    Not on file   Tobacco Use    Smoking status: Never    Smokeless tobacco: Never   Vaping Use    Vaping status: Never Used   Substance and Sexual Activity    Alcohol use: Never    Drug use: Never    Sexual activity: Not on file   Other Topics Concern    Not on file   Social History Narrative    ** Merged History Encounter **          Social Determinants of Health     Financial Resource Strain: Not on file   Food Insecurity: Not on file   Transportation Needs: Not on file   Physical Activity: Not on file   Stress: Not on file   Social Connections: Not on file   Intimate Partner Violence: Not on file   Housing Stability: Not on file       Allergies:     Allergies   Allergen Reactions    Bactrim [Sulfamethoxazole-Trimethoprim] Hives, Respiratory Distress and Edema    Glipizide Edema     Throat swelling    Lisinopril Edema     Tongue swelling       Medications:     Current Facility-Administered Medications   Medication Dose Route Frequency    apixaban  10 mg Oral Q12H SCH    Followed by    Melene Muller ON 02/09/2022] apixaban  5 mg Oral Q12H SCH    ARIPiprazole  10 mg Oral Daily    aspirin  81 mg Oral Daily    atorvastatin  80 mg Oral QHS    budesonide-formoterol  2 puff Inhalation BID    bumetanide  2 mg Oral BID    buPROPion XL  150 mg Oral Daily    carvedilol  25 mg Oral Q12H SCH    cetirizine  5 mg Oral QHS    diphenhydrAMINE  12.5 mg Intravenous Once    [START ON 02/04/2022] docusate sodium  100 mg Oral Q12H    DULoxetine  60 mg Oral Daily    fluticasone  2 spray Each Nare Daily    folic acid  1 mg Oral Daily    gabapentin  600 mg Oral BID    hydrALAZINE  75 mg Oral Q12H    insulin glargine  35 Units Subcutaneous Q12H    insulin lispro  1-4 Units Subcutaneous QHS    insulin lispro  1-8 Units Subcutaneous TID AC    insulin lispro  10 Units  Subcutaneous TID AC    montelukast  10 mg Oral QHS    pantoprazole  40 mg Oral QAM AC    traZODone  150 mg Oral QHS       Review of Systems:   Ten point review of systems negative or as per HPI and below endorsements.      Physical Exam:     Vital Signs (most recent): BP 116/76   Pulse 80   Temp 98.1 F (36.7 C) (Oral)   Resp 17   Ht 1.676 m (5\' 6" )   Wt 135.4 kg (298 lb 8.1 oz)   SpO2 96%   BMI 48.18 kg/m     Constitutional: No apparent distress.  Patient speaks freely in full sentences.   HEENT: NC/AT, PERRL, no scleral icterus or conjunctival pallor, no nasal discharge, MMM, oropharynx without erythema or exudate  Neck: trachea midline, supple, no cervical or supraclavicular lymphadenopathy or masses  Cardiovascular: RRR, normal S1 S2, no murmurs, gallops, palpable thrills, no JVD, Non-displaced PMI.  Respiratory: Normal rate. No retractions or increased work of breathing. Clear to auscultation and percussion bilaterally.  Gastrointestinal: +BS, non-distended, soft, non-tender, no rebound or guarding, no hepatosplenomegaly  Genitourinary: no suprapubic or costovertebral angle tenderness  Musculoskeletal: ROM and motor strength grossly normal. No clubbing, edema, or cyanosis. DP and radial pulses 2+ and symmetric.  Skin: no rashes, jaundice or other lesions  Neurologic: EOMI, CN 2-12 grossly intact. no gross motor or sensory deficits, patellar and bicep DTR 2+ and symmetric, downward plantar reflexes  Psychiatric: AAOx3, affect and mood appropriate. The patient is alert, interactive, appropriate.    Labs Reviewed:     Results       Procedure Component Value Units Date/Time    Magnesium [[id number]] Collected: 02/03/22 1048    Specimen: Blood Updated: 02/03/22 1202     Magnesium 1.8 mg/dL     GFR [[id number]] Collected: 02/03/22 1048     Updated: 02/03/22 1202     EGFR 22.1       Basic Metabolic Panel [[id number]]  (Abnormal) Collected: 02/03/22 1048    Specimen: Blood Updated: 02/03/22 1202     Glucose 225  mg/dL      BUN 46.9 mg/dL      Creatinine 2.4 mg/dL      Calcium 8.1 mg/dL      Sodium 629 mEq/L      Potassium 4.3 mEq/L      Chloride 107 mEq/L      CO2 20 mEq/L      Anion Gap 9.0    Glucose Whole Blood - POCT [[id number]]  (Abnormal) Collected: 02/03/22 1131     Updated: 02/03/22 1133     Whole Blood Glucose POCT 197 mg/dL     High Sensitivity Troponin-I [[id number]]  (Abnormal) Collected: 02/03/22 1041    Specimen: Blood Updated:  02/03/22 1111     hs Troponin-I 19.2 ng/L     Vitamin B12 [[id number]] Collected: 02/03/22 0436    Specimen: Blood Updated: 02/03/22 1053     Vitamin B-12 554 pg/mL     TSH [[id number]] Collected: 02/03/22 0436    Specimen: Blood Updated: 02/03/22 1047     TSH 2.94 uIU/mL     IRON PROFILE [[id number]]  (Abnormal) Collected: 02/03/22 0436     Updated: 02/03/22 1000     Iron 28 ug/dL      UIBC 086 ug/dL      TIBC 578 ug/dL      Iron Saturation 18 %     Hemolysis index [[id number]]  (Abnormal) Collected: 02/03/22 0436     Updated: 02/03/22 1000     Hemolysis Index 27 Index     Hemoglobin A1C [[id number]]  (Abnormal) Collected: 02/03/22 0436    Specimen: Blood Updated: 02/03/22 1000     Hemoglobin A1C 9.4 %      Average Estimated Glucose 223.1 mg/dL     Hemolysis index [[id number]] Collected: 02/03/22 0436     Updated: 02/03/22 0951     Hemolysis Index 19 Index     Glucose Whole Blood - POCT [[id number]]  (Abnormal) Collected: 02/03/22 0837     Updated: 02/03/22 0840     Whole Blood Glucose POCT 322 mg/dL     GFR [[id number]] Collected: 02/03/22 0436     Updated: 02/03/22 0608     EGFR 21.1       Comprehensive metabolic panel [[id number]]  (Abnormal) Collected: 02/03/22 0436    Specimen: Blood Updated: 02/03/22 0608     Glucose 427 mg/dL      BUN 60.6 mg/dL      Creatinine 2.5 mg/dL      Sodium 301 mEq/L      Potassium 4.5 mEq/L      Chloride 105 mEq/L      CO2 18 mEq/L      Calcium 7.8 mg/dL      Protein, Total 4.9 g/dL      Albumin 1.9 g/dL      AST (SGOT) 7 U/L      ALT 8 U/L      Alkaline  Phosphatase 96 U/L      Bilirubin, Total 0.1 mg/dL      Globulin 3.0 g/dL      Albumin/Globulin Ratio 0.6     Anion Gap 9.0    Magnesium [[id number]]  (Abnormal) Collected: 02/03/22 0436    Specimen: Blood Updated: 02/03/22 0608     Magnesium 1.5 mg/dL     Phosphorus [[id number]] Collected: 02/03/22 0436    Specimen: Blood Updated: 02/03/22 0608     Phosphorus 4.2 mg/dL     PT/APTT [[id number]]  (Abnormal) Collected: 02/03/22 0436     Updated: 02/03/22 0527     PT 14.0 sec      PT INR 1.2     PTT 41 sec     CBC without differential [[id number]]  (Abnormal) Collected: 02/03/22 0436    Specimen: Blood Updated: 02/03/22 0521     WBC 13.35 x10 3/uL      Hgb 8.4 g/dL      Hematocrit 07.3 %      Platelets 336 x10 3/uL      RBC 3.15 x10 6/uL      MCV 84.4 fL      MCH 26.7 pg      MCHC 31.6 g/dL      RDW 12 %  MPV 10.9 fL      Nucleated RBC 0.0 /100 WBC      Absolute NRBC 0.00 x10 3/uL     Glucose Whole Blood - POCT [[id number]]  (Abnormal) Collected: 02/03/22 0302     Updated: 02/03/22 0308     Whole Blood Glucose POCT 342 mg/dL     High Sensitivity Troponin-I at 2 hrs with calculated Delta [[id number]]  (Abnormal) Collected: 02/03/22 0112    Specimen: Blood Updated: 02/03/22 0142     hs Troponin-I 31.6 ng/L      hs Troponin-I Delta 0.7 ng/L     COVID-19 (SARS-CoV-2) only ([facility]) asymptomatic admission [[id number]] Collected: 02/03/22 0027    Specimen: Nasopharyngeal Updated: 02/03/22 0136     Purpose of COVID testing Screening     SARS-CoV-2 Specimen Source Nasal Swab     SARS CoV 2 Overall Result Not Detected    Narrative:      o Collect and clearly label specimen type:  o PREFERRED-Upper respiratory specimen: One Nasal Swab in  Transport Media.  o Hand deliver to laboratory ASAP  Indication for testing->Extended care facility admission to  semi private room  Screening    NT-proBNP [[id number]]  (Abnormal) Collected: 02/02/22 2306     Updated: 02/02/22 2340     NT-proBNP 572 pg/mL     High Sensitivity Troponin-I at 0  hrs [[id number]]  (Abnormal) Collected: 02/02/22 2306    Specimen: Blood Updated: 02/02/22 2334     hs Troponin-I 30.9 ng/L     Comprehensive metabolic panel [[id number]]  (Abnormal) Collected: 02/02/22 2306    Specimen: Blood Updated: 02/02/22 2328     Glucose 375 mg/dL      BUN 03.4 mg/dL      Creatinine 2.8 mg/dL      Sodium 742 mEq/L      Potassium 4.7 mEq/L      Chloride 103 mEq/L      CO2 20 mEq/L      Calcium 8.0 mg/dL      Protein, Total 5.5 g/dL      Albumin 2.1 g/dL      AST (SGOT) 9 U/L      ALT 9 U/L      Alkaline Phosphatase 113 U/L      Bilirubin, Total 0.1 mg/dL      Globulin 3.4 g/dL      Albumin/Globulin Ratio 0.6     Anion Gap 11.0    GFR [[id number]] Collected: 02/02/22 2306     Updated: 02/02/22 2328     EGFR 18.5       CBC and differential [[id number]]  (Abnormal) Collected: 02/02/22 2306    Specimen: Blood Updated: 02/02/22 2314     WBC 14.98 x10 3/uL      Hgb 9.0 g/dL      Hematocrit 41.6 %      Platelets 356 x10 3/uL      RBC 3.35 x10 6/uL      MCV 84.2 fL      MCH 26.9 pg      MCHC 31.9 g/dL      RDW 12 %      MPV 10.7 fL      Instrument Absolute Neutrophil Count 9.06 x10 3/uL      Neutrophils 60.4 %      Lymphocytes Automated 29.8 %      Monocytes 6.6 %      Eosinophils Automated 2.1 %      Basophils Automated 0.6 %  Immature Granulocytes 0.5 %      Nucleated RBC 0.0 /100 WBC      Neutrophils Absolute 9.06 x10 3/uL      Lymphocytes Absolute Automated 4.46 x10 3/uL      Monocytes Absolute Automated 0.99 x10 3/uL      Eosinophils Absolute Automated 0.31 x10 3/uL      Basophils Absolute Automated 0.09 x10 3/uL      Immature Granulocytes Absolute 0.07 x10 3/uL      Absolute NRBC 0.00 x10 3/uL             Recent CBC   Recent Labs     02/03/22  0436   RBC 3.15*   Hgb 8.4*   Hematocrit 26.6*   MCV 84.4   MCH 26.7   MCHC 31.6   RDW 12   MPV 10.9     Recent BMP   Recent Labs     02/03/22  1048   Glucose 225*   BUN 47.0*   Creatinine 2.4*   Calcium 8.1*   Sodium 136   Potassium 4.3   Chloride 107    CO2 20     Recent CMP   Recent Labs     02/03/22  1048 02/03/22  0436   Glucose 225* 427*   BUN 47.0* 46.0*   Creatinine 2.4* 2.5*   Sodium 136 132*   CO2 20 18   Calcium 8.1* 7.8*   Albumin  --  1.9*   AST (SGOT)  --  7   ALT  --  8   Globulin  --  3.0       Rads:   [place] VenoDopp Low Extremity Left    Result Date: 02/02/2022   1. Nonocclusive deep venous thrombosis of the LEFT lower extremity mid and distal superficial femoral vein. Critical results were discussed with Dr. Lorene Dy by Dr. Jackey Loge at 11:20 PM on 02/02/2022. Judd Gaudier, MD 02/02/2022 11:30 PM    XR Chest  AP Portable    Result Date: 02/02/2022   Negative for acute process  Gerlene Burdock, MD 02/02/2022 11:15 PM   Radiological Procedure reviewed.     Signed by: Robert Bellow, MD

## 2022-02-04 ENCOUNTER — Observation Stay (HOSPITAL_BASED_OUTPATIENT_CLINIC_OR_DEPARTMENT_OTHER): Payer: BC Managed Care – PPO

## 2022-02-04 DIAGNOSIS — R609 Edema, unspecified: Secondary | ICD-10-CM

## 2022-02-04 DIAGNOSIS — I82462 Acute embolism and thrombosis of left calf muscular vein: Secondary | ICD-10-CM

## 2022-02-04 DIAGNOSIS — I5033 Acute on chronic diastolic (congestive) heart failure: Secondary | ICD-10-CM

## 2022-02-04 DIAGNOSIS — I951 Orthostatic hypotension: Secondary | ICD-10-CM

## 2022-02-04 DIAGNOSIS — I517 Cardiomegaly: Secondary | ICD-10-CM

## 2022-02-04 DIAGNOSIS — R778 Other specified abnormalities of plasma proteins: Secondary | ICD-10-CM

## 2022-02-04 DIAGNOSIS — I455 Other specified heart block: Secondary | ICD-10-CM

## 2022-02-04 DIAGNOSIS — N1831 Chronic kidney disease, stage 3a: Secondary | ICD-10-CM

## 2022-02-04 DIAGNOSIS — N170 Acute kidney failure with tubular necrosis: Secondary | ICD-10-CM

## 2022-02-04 DIAGNOSIS — E1122 Type 2 diabetes mellitus with diabetic chronic kidney disease: Secondary | ICD-10-CM

## 2022-02-04 DIAGNOSIS — I509 Heart failure, unspecified: Secondary | ICD-10-CM

## 2022-02-04 LAB — ECHOCARDIOGRAM ADULT COMPLETE W CLR/ DOPP WAVEFORM
AV Area (Cont Eq VTI): 2.53
AV Area (Cont Eq VTI): 2.531
AV Area (Cont Eq VTI): 3
AV Mean Gradient: 5
AV Peak Velocity: 140
Ao Root Diameter (2D): 3.6
BP Mod LV Ejection Fraction: 65.375
IVS Diastolic Thickness (2D): 1.21
LA Dimension (2D): 3.9
LA Volume Index (BP A-L): 0.026
LVID diastole (2D): 4.9
LVID systole (2D): 3.09
MV Area (PHT): 2.625
MV E/A: 1.088
MV E/A: 1.1
MV E/e' (Average): 15.144
Mitral Valve Findings: NORMAL
Prox Ascending Aorta Diameter: 3.6
RV Basal Diastolic Dimension: 3.26
RV Function: NORMAL
RV Systolic Pressure: 28.976
Site RA Size (AS): NORMAL
Site RV Size (AS): NORMAL
TAPSE: 2.67
Tricuspid Valve Findings: NORMAL

## 2022-02-04 LAB — GLUCOSE WHOLE BLOOD - POCT
Whole Blood Glucose POCT: 244 mg/dL — ABNORMAL HIGH (ref 70–100)
Whole Blood Glucose POCT: 211 mg/dL — ABNORMAL HIGH (ref 70–100)
Whole Blood Glucose POCT: 202 mg/dL — ABNORMAL HIGH (ref 70–100)
Whole Blood Glucose POCT: 209 mg/dL — ABNORMAL HIGH (ref 70–100)

## 2022-02-04 LAB — RENAL FUNCTION PANEL
Albumin: 1.9 g/dL — ABNORMAL LOW (ref 3.5–5.0)
Anion Gap: 7 (ref 5.0–15.0)
BUN: 61 mg/dL — ABNORMAL HIGH (ref 7.0–21.0)
CO2: 20 mEq/L (ref 17–29)
Calcium: 7.9 mg/dL — ABNORMAL LOW (ref 8.5–10.5)
Chloride: 104 mEq/L (ref 99–111)
Creatinine: 3.1 mg/dL — ABNORMAL HIGH (ref 0.4–1.0)
Glucose: 265 mg/dL — ABNORMAL HIGH (ref 70–100)
Phosphorus: 5.7 mg/dL — ABNORMAL HIGH (ref 2.3–4.7)
Potassium: 5.1 mEq/L (ref 3.5–5.3)
Sodium: 131 mEq/L — ABNORMAL LOW (ref 135–145)

## 2022-02-04 LAB — MICROALBUMIN, RANDOM URINE: Urine Microalbumin, Random: >2000 ug/ml — ABNORMAL HIGH (ref 0.0–30.0)

## 2022-02-04 LAB — ECG 12-LEAD
Atrial Rate: 68 {beats}/min
P Axis: 44 degrees
P-R Interval: 168 ms
Q-T Interval: 444 ms
QRS Duration: 80 ms
QTC Calculation (Bezet): 472 ms
R Axis: -14 degrees
T Axis: 145 degrees
Ventricular Rate: 68 {beats}/min

## 2022-02-04 LAB — CBC
Absolute NRBC: 0 10*3/uL (ref 0.00–0.00)
Hematocrit: 26 % — ABNORMAL LOW (ref 34.7–43.7)
Hgb: 8.2 g/dL — ABNORMAL LOW (ref 11.4–14.8)
MCH: 26.6 pg (ref 25.1–33.5)
MCHC: 31.5 g/dL (ref 31.5–35.8)
MCV: 84.4 fL (ref 78.0–96.0)
MPV: 10.2 fL (ref 8.9–12.5)
Nucleated RBC: 0 /100 WBC (ref 0.0–0.0)
Platelets: 322 10*3/uL (ref 142–346)
RBC: 3.08 10*6/uL — ABNORMAL LOW (ref 3.90–5.10)
RDW: 12 % (ref 11–15)
WBC: 10.9 10*3/uL — ABNORMAL HIGH (ref 3.10–9.50)

## 2022-02-04 LAB — PROTEIN / CREATININE RATIO, URINE
Urine Creatinine, Random: 119.1 mg/dL
Urine Protein Random: 479 mg/dL — ABNORMAL HIGH (ref 1.0–14.0)
Urine Protein/Creatinine Ratio: 4

## 2022-02-04 LAB — URINALYSIS, REFLEX TO MICROSCOPIC EXAM IF INDICATED
Bilirubin, UA: NEGATIVE
Glucose, UA: >=500 — AB
Ketones UA: NEGATIVE
Leukocyte Esterase, UA: NEGATIVE
Nitrite, UA: NEGATIVE
Protein, UR: >=500 — AB
Specific Gravity UA: 1.013 (ref 1.001–1.035)
Urine pH: 5 (ref 5.0–8.0)
Urobilinogen, UA: NORMAL mg/dL (ref 0.2–2.0)

## 2022-02-04 LAB — MAGNESIUM: Magnesium: 1.8 mg/dL (ref 1.6–2.6)

## 2022-02-04 LAB — HIGH SENSITIVITY TROPONIN-I: hs Troponin-I: 13.9 ng/L

## 2022-02-04 LAB — PTH, INTACT: PTH Intact: 230.9 pg/mL — ABNORMAL HIGH (ref 17.7–84.5)

## 2022-02-04 LAB — GFR: EGFR: 16.4

## 2022-02-04 MED ORDER — SODIUM CHLORIDE 0.9 % IV BOLUS
250.0000 mL | Freq: Once | INTRAVENOUS | Status: DC
Start: 2022-02-04 — End: 2022-02-06

## 2022-02-04 MED ORDER — BUMETANIDE 1 MG PO TABS
2.0000 mg | ORAL_TABLET | Freq: Two times a day (BID) | ORAL | Status: DC
Start: 2022-02-05 — End: 2022-02-05
  Filled 2022-02-04: qty 2

## 2022-02-04 NOTE — Nursing Progress Note (Signed)
Patient received from tele unit by wheelchair. Pt. Report received from [place], [place]. Orient the pt. To the room and call bell system, welcome package provided.    Patient is A&O x4 on RA. VSS. Skin assessment performed with Rene Kocher, RN.  Patient has scattered Tattoos, scar on right hand and abdomen, Abrasion on bilateral shin, non-blanchable ulcer on right 3rd toe, cracking on B/L heels and scattered Abrasion on right foot.     Fall precaution intervention on placed. Call bell within reach.

## 2022-02-04 NOTE — Plan of Care (Signed)
Problem: Moderate/High Fall Risk Score >5  Goal: Patient will remain free of falls  Outcome: Progressing  Flowsheets (Taken 02/03/2022 1416)  VH Moderate Risk (6-13): ALL REQUIRED LOW INTERVENTIONS     Problem: Venous Thrombotic Event  Goal: Knowledge deficit/VTE Prevention  Outcome: Progressing  Flowsheets (Taken 02/04/2022 0948)  Knowledge deficit/ VTE Prevention:   Reinforce signs/symptoms to report to physician   Reinforce personal safety measures while on anticoagulant therapy   Review medications: to include dose, frequency, drug interactions and side effects   Discuss medications to avoid without physician approval   Reinforce that patient must tell their doctor about all medications they are taking including OTC, supplements and herbal preparations   Discuss importance of follow up lab studies as directed by physician if discharged on anticoagulants   Reinforce need for patient to tell their doctor or dentist that they are taking an anticoagulant   Review dietary restrictions if on Warfarin as directed by dietitian   Self-administration of injections and tobacco cessation counseling as applicable  Goal: Free from evidence of bleeding  Outcome: Progressing  Flowsheets (Taken 02/04/2022 0948)  Free from evidence of bleeding:   Administer VTE prophylaxis as prescribed   Administer Anticoagulants as prescribed   Initiate anticoagulation precautions   Assess patient's risk for falls and implement fall prevention plan of care per policy   Monitor for signs and symptoms of bleeding   Monitor for adverse reactions to anticoagulant   Monitor results of ordered laboratory studies   Obtain nutritional consult if patient to be discharged on Warfarin for dietary teaching   Increase activity as tolerated/progressive mobility     Problem: Pain interferes with ability to perform ADL  Goal: Pain at adequate level as identified by patient  Outcome: Progressing  Flowsheets (Taken 02/04/2022 0948)  Pain at adequate level as  identified by patient:   Identify patient comfort function goal   Assess for risk of opioid induced respiratory depression, including snoring/sleep apnea. Alert healthcare team of risk factors identified.   Assess pain on admission, during daily assessment and/or before any "as needed" intervention(s)   Reassess pain within 30-60 minutes of any procedure/intervention, per Pain Assessment, Intervention, Reassessment (AIR) Cycle   Evaluate if patient comfort function goal is met   Evaluate patient's satisfaction with pain management progress   Offer non-pharmacological pain management interventions   Consult/collaborate with Pain Service   Consult/collaborate with Physical Therapy, Occupational Therapy, and/or Speech Therapy   Include patient/patient care companion in decisions related to pain management as needed

## 2022-02-04 NOTE — PT Progress Note (Signed)
Physical Therapy Note    Physical Therapy Treatment    [name]    Unit: [facility] TELEMETRY  Bed: N562/Z308-65      Post Acute Care Therapy Recommendations:   Discharge Recommendations:  Home with supervision    DME needs IF patient is discharging home: Front wheel walker (bariatric vs rollator (pending insurance coverage, pt requests rollator))    Therapy discharge recommendations may change with patient status.  Please refer to most recent note for up-to-date recommendations.    Recommended  (non-medical) mode of transportation at discharge:   car    PMP - Progressive Mobility Protocol   PMP Activity: Step 4 - Dangle at Bedside (Stands/marches in room)    Assessment:   Pt activity limited today 2/2 symptomatic dizziness with standing and pre-gait activities.  BP sitting edge of bed 119/80, pt c/o dizziness upon standing with BP 112/79, pt c/o worsening dizziness with standing marching with BP drop to 97/67.  Continued marching brought BP stabilizing at 92/64. Pt reports worsening dizziness and declines gait training.  Seated BP 111/73.  HR 72-79 bpm throughout session.  Rec close monitoring of vital signs with activity if symptoms persist; progressive activity as able.  Pt did tolerate 50 ft amb yesterday with  RW and SBA--recommend continued amb with nursing and PT to continue to follow as well.       Prognosis: Good      Interdisciplinary Communication:   Patient up in bed with alarm activated; call bell within reach. Updated white communication board in room with patient's current mobility status.  Communicated with nsg, via verbal communication regarding pt symptomatic hypotension.    Education:   Further education provided to patient on progress, safety with mobility and ADLs, home safety, falls prevention.  Demonstrated good understanding.  Will benefit from further training to focus on endurance and functional mobility.      Plan:   Goals  Goal Formulation: With patient  Time for Goal Acheivement: 5  visits  Pt Will Transfer Bed/Chair: independent;Not met  Pt Will Ambulate: > 200 feet;with rolling walker;modified independent;Not met  Plan  Risks/Benefits/POC Discussed with Pt/Family: With patient  Treatment/Interventions: Exercise;Gait training;Stair training;Neuromuscular re-education;LE strengthening/ROM  PT Frequency: 3-4x/wk    Continue plan of care.    ------------------------------------------------------------------------------------------------------------------    Time of treatment:   Start Time: 1145 Stop Time: 1200  Time Calculation (min): 15 min  PT Received On: 02/04/22      Treatment # 2      Precautions  Weight Bearing Status: no restrictions  Other Precautions: Falls                      Patient's medical condition is appropriate for Physical Therapy intervention at this time.     Subjective:   Patient is agreeable to participation in the therapy session.      Pain Assessment  Pain Assessment: Numeric Scale (0-10)  Pain Score: 3-mild pain  Pain Location:  (LLE)  Pain Intervention(s): Medication (See eMAR)                      Objective:  Patient is in bed with telemetry in place.    Vital signs: refer to assessment above.               Cognition/Neuro Status  Arousal/Alertness: Appropriate responses to stimuli  Attention Span: Appears intact  Orientation Level: Oriented X4  Memory: Appears intact  Following Commands: Follows all  commands and directions without difficulty  Behavior: calm;cooperative    Functional Mobility  Rolling: Independent  Supine to Sit: Independent  Scooting to EOB: Independent  Sit to Stand: Independent  Stand to Sit: Independent         Locomotion  Ambulation: Unable to assess (Comment) (pt c/o dizziness, drop in BP)    Therapeutic Exercise Seated, BLE AROM  Hip Flexion: 10  Knee AROM : 10  Ankle Pumps: 10  Strengthening:  (standing marching x 10 x 2 sets)             Signature: Kari Baars, MPT            Unit: T4C TELEMETRY  Bed: F621/H086-57

## 2022-02-04 NOTE — Progress Notes (Addendum)
[facility]  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)  Vitamin D deficiency (level < 13 in 2021)     Chronic diastolic CHF  Morbid obesity  OSA not on CPAP  Asthma - requires steroids frequently     Anemia of CKD/Fe def  Normal B12  LLE DVT  H/o PE    Nephrotic syndrome  Albumin < 2  Await UPCR/UACR    Hyponatremia      Plan:    Creatinine: 2.8-> 2.4-> 3.1  CKD w/up pending  Proteinuria w/up  Oral bumex 2 mg bid  Renally dosed gabapentin  Coreg 25 mg bid and hydralazine 75 mg bid  IV Fe  Vit D supplementation  Needs better DM control  Eliquis  Hold RAASi for now  Daily weights  Strict intake and output  Labs/supportive care  Outpt renal FU    Echo pending    @ risk for needing RRT - D/W patient    Nephrotic syndrome contributing to hypervolemia   Unable to use RAASi/MRA/SGLT-2i due to worsening renal function  Please use lasix as needed in meantime for volume control          Collier FlowersPiyush Marlene Pfluger, MD  Office - [phone], or EpicChat  [employer] [phone]    Medications:  Scheduled Meds:  Current Facility-Administered Medications   Medication Dose Route Frequency    apixaban  10 mg Oral Q12H SCH    Followed by    Melene Muller[START ON 02/09/2022] apixaban  5 mg Oral Q12H SCH    ARIPiprazole  10 mg Oral Daily    aspirin  81 mg Oral Daily    atorvastatin  80 mg Oral QHS    budesonide-formoterol  2 puff Inhalation BID    bumetanide  2 mg Oral BID    buPROPion XL  150 mg Oral Daily    carvedilol  25 mg Oral Q12H SCH    cetirizine  5 mg Oral QHS    diphenhydrAMINE  12.5 mg Intravenous Once    docusate sodium  100 mg Oral Q12H    DULoxetine  60 mg Oral Daily     fluticasone  2 spray Each Nare Daily    folic acid  1 mg Oral Daily    gabapentin  300 mg Oral BID    hydrALAZINE  75 mg Oral Q12H    insulin glargine  35 Units Subcutaneous Q12H    insulin lispro  1-4 Units Subcutaneous QHS    insulin lispro  1-8 Units Subcutaneous TID AC    insulin lispro  10 Units Subcutaneous TID AC    iron sucrose  200 mg Intravenous Q24H SCH    montelukast  10 mg Oral QHS    pantoprazole  40 mg Oral QAM AC    traZODone  150 mg Oral QHS    vitamin D (ergocalciferol)  50,000 Units Oral Weekly     Continuous Infusions:  PRN Meds:acetaminophen **OR** acetaminophen, albuterol sulfate HFA, baclofen, dextrose **OR** dextrose **OR** dextrose **OR** glucagon (rDNA), hydrOXYzine, naloxone, oxyCODONE, polyethylene  glycol, promethazine **OR** promethazine    Objective:  Vital signs in last 24 hours:  Temp:  [97.9 F (36.6 C)-98.6 F (37 C)] 97.9 F (36.6 C)  Heart Rate:  [71-80] 71  Resp Rate:  [17-18] 18  BP: (102-147)/(68-89) 110/68  Intake/Output last 24 hours:    Intake/Output Summary (Last 24 hours) at 02/04/2022 1044  Last data filed at 02/04/2022 0740  Gross per 24 hour   Intake 299 ml   Output 1730 ml   Net -1431 ml     Intake/Output this shift:  I/O this shift:  In: -   Out: 900 [Urine:900]    Physical Exam:  Physical Exam    Gen: No acute distress  Eyes: Pallor, anicteric  CVS: S1 S2 present, no rub  RS: Dec AE @ bases  Abd: Soft, non-tender  Skin: Warm and dry  Psych: Flat affect  CNS: Grossly non focal    Labs:  Recent Labs   Lab 02/04/22  0656 02/03/22  1048 02/03/22  0436 02/02/22  2306   Glucose 265* 225* 427* 375*   BUN 61.0* 47.0* 46.0* 44.0*   Creatinine 3.1* 2.4* 2.5* 2.8*   Calcium 7.9* 8.1* 7.8* 8.0*   Sodium 131* 136 132* 134*   Potassium 5.1 4.3 4.5 4.7   Chloride 104 107 105 103   CO2 20 20 18 20    Albumin 1.9*  --  1.9* 2.1*   Phosphorus 5.7*  --  4.2  --    Magnesium 1.8 1.8 1.5*  --      Recent Labs   Lab 02/04/22  0656 02/03/22  0436 02/02/22  2306   WBC 10.90* 13.35* 14.98*    Hgb 8.2* 8.4* 9.0*   Hematocrit 26.0* 26.6* 28.2*   MCV 84.4 84.4 84.2   MCH 26.6 26.7 26.9   MCHC 31.5 31.6 31.9   RDW 12 12 12    MPV 10.2 10.9 10.7   Platelets 322 336 356*

## 2022-02-04 NOTE — Progress Notes (Signed)
Gave report to [place], Charity fundraiser. Patient transferred from 439 to room 489, medical unit. Patient's belongings at bedside. Tele box already returned.

## 2022-02-04 NOTE — Progress Notes (Signed)
[facility]  HOSPITALIST  PROGRESS NOTE      Patient: [name]  Date: 02/04/2022   LOS: 0 Days  Admission Date: 02/02/2022   MRN: [id number]  Attending:  MD     ASSESSMENT/PLAN     [name] is a 41 y.o. 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 Right LE patent, no e/o DVT  - Eliquis re-started at 10mg  loading dose for next 7d, followed by 5mg   - Hypercoagulable workup as per heme as outpt  - Will need repeat doppler in 6 weeks to monitor clot  - Oxycodone 5mg  q6h prn pain  - Leukocytosis likely reactive 2/to DVT as no signs of infection     Acute on chronic HFpEF  Mildly elevated troponin likely demand ischemia  Suspected cardiorenal syndrome  Uncontrolled HTN  - Echo May 2022: LVEF 50-55%. G1DD. No significant valvular dysfunction.  - BNP mildly elevated, CXR without acute process  - Trop mildly elevated though flat x 2, will check 3rd in 6h  - S/p 2 doses of IV lasix thus far in house, mildly worsening renal fx today  - allow pt to self hydrate  - holding bumex this AM and likely PM  - Continue carvedilol, low dose aspirin  - adjusting hydralazine given episode of orthostasis while ambulating with PT, though the values are not recorded pt states her BP sitting was 123/70, and upon standing systolic went to 96, and she was dizzy  On repeat orthostatics, she is not orthostatic, so decision is made not to give IVF< but to hold hydralazine this afternoon, and can recheck orthostatics if positive again would reduce hydralazine all together  - Hold olmesartan, metolazone due to AKI  - Appreciate cardiology input     HLD  - Continue atorvastatin 80mg  qHS     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  Sodium corrected with hyperglycemia, in range  Corrected calcium with hypoalbuminemia, in range  -  Cr 3.1 today, worsened from yesterday's value  - hold morning oral diuretic, d/w nephro, would not give IVF as she has nephrotic syndrome and would worsen edema  Nephrology consulted, appreciate input  - Trend renal function and lytes  - Renally dose medications, gabapentin dose reduced for renal fx  - may be NSAID induced, as pt states she was using Ibuprofen for past 2 weeks after her carpal tunnel surgery        Poorly controlled T2DM with hyperglycemia, diabetic retinopathy, on long-term insulin  - Continue Lantus at reduced dose 35u BID  - Continue preprandial Lispro at reduced dose 10u TID with meals  - Check BG before meals, at bedtime  - Add PSSI coverage or uptitrate as warranted  - Check A1c     Neuropathy  Fibromyalgia  OA  - Continue gabapentin, Cymbalta  - Baclofen prn muscle spasms     Anemia of chronic disease  - Hb stable around b/l 9  - Continue folic acid  - Check iron profile, vitamin B12  - Requires Aranesp monthly, occasional Venofer infusions per patient     Asthma, unspecified type  Seasonal and environmental allergies  Left otitis media with effusion  - Continue Dulera (sub Symbicort) scheduled, Albuterol prn  - Continue cetirizine, montelukast, Flonase     Depression/anxiety  -  Continue Abilify, bupropion daily  - Continue trazodone for sleep     OSA not on CPAP, noted     Obesity Class 3, BMI>48  - Lifestyle modifications recommended             Analgesia: oxycodone     Nutrition: diabetic/cardiac       Pt can be downgraded to medical this afternoon    Safety Checklist  DVT prophylaxis: Chemical and / or mechanical ppx NOT indicated or contraindicated: Fully anticoagulated    Foley: Not present   IVs:  Peripheral IV   PT/OT: Ordered   Daily CBC & or Chem ordered: Yes, due to clinical and lab instability       Patient Lines/Drains/Airways Status       Active PICC Line / CVC Line / PIV Line / Drain / Airway / Intraosseous Line / Epidural Line / ART Line / Line / Wound / Pressure Ulcer / NG/OG  Tube       Name Placement date Placement time Site Days    Peripheral IV 02/02/22 22 G Posterior;Right Hand 02/02/22  2300  Hand  1    Wound 12/05/21 Surgical Incision Arm Right 12/05/21  0837  Arm  61                    MD/RN rounds: yes       Code Status: full code    DISPO: home with family    Family Contact: daughter at bedside    Care Plan discussed with nursing, consultants, case Production designer, theatre/television/film.       SUBJECTIVE     [name] states she feels less SOB today, denies any new complaints    MEDICATIONS     Current Facility-Administered Medications   Medication Dose Route Frequency    apixaban  10 mg Oral Q12H SCH    Followed by    Melene Muller ON 02/09/2022] apixaban  5 mg Oral Q12H SCH    ARIPiprazole  10 mg Oral Daily    aspirin  81 mg Oral Daily    atorvastatin  80 mg Oral QHS    budesonide-formoterol  2 puff Inhalation BID    bumetanide  2 mg Oral BID    buPROPion XL  150 mg Oral Daily    carvedilol  25 mg Oral Q12H SCH    cetirizine  5 mg Oral QHS    diphenhydrAMINE  12.5 mg Intravenous Once    docusate sodium  100 mg Oral Q12H    DULoxetine  60 mg Oral Daily    fluticasone  2 spray Each Nare Daily    folic acid  1 mg Oral Daily    gabapentin  300 mg Oral BID    hydrALAZINE  75 mg Oral Q12H    insulin glargine  35 Units Subcutaneous Q12H    insulin lispro  1-4 Units Subcutaneous QHS    insulin lispro  1-8 Units Subcutaneous TID AC    insulin lispro  10 Units Subcutaneous TID AC    iron sucrose  200 mg Intravenous Q24H SCH    montelukast  10 mg Oral QHS    pantoprazole  40 mg Oral QAM AC    sodium chloride  250 mL Intravenous Once    traZODone  150 mg Oral QHS    vitamin D (ergocalciferol)  50,000 Units Oral Weekly       ROS     Remainder of 10 point ROS as above or otherwise  negative    PHYSICAL EXAM     Vitals:    02/04/22 1319   BP: 104/69   Pulse: 74   Resp:    Temp:    SpO2: 99%       Temperature: Temp  Min: 97.9 F (36.6 C)  Max: 98.6 F (37 C)  Pulse: Pulse  Min: 71  Max: 80  Respiratory: Resp  Min: 16   Max: 18  Non-Invasive BP: BP  Min: 93/58  Max: 147/86  Pulse Oximetry SpO2  Min: 95 %  Max: 100 %    Intake and Output Summary (Last 24 hours) at Date Time    Intake/Output Summary (Last 24 hours) at 02/04/2022 1355  Last data filed at 02/04/2022 0740  Gross per 24 hour   Intake 299 ml   Output 1730 ml   Net -1431 ml       GEN APPEARANCE: Normal;  A&OX3  HEENT: PERLA; EOMI; Conjunctiva Clear  NECK: Supple; No bruits  CVS: RRR, S1, S2; No M/G/R  LUNGS: CTAB; No Wheezes; No Rhonchi: No rales  ABD: Soft; No TTP; + Normoactive BS  EXT: trace edema; Pulses 2+ and intact, normal capillary refill  SKIN: No rash or Lesions  NEURO: CN 2-12 intact; No Focal neurological deficits      LABS     Recent Labs   Lab 02/04/22  0656 02/03/22  0436 02/02/22  2306   WBC 10.90* 13.35* 14.98*   RBC 3.08* 3.15* 3.35*   Hgb 8.2* 8.4* 9.0*   Hematocrit 26.0* 26.6* 28.2*   MCV 84.4 84.4 84.2   Platelets 322 336 356*       Recent Labs   Lab 02/04/22  0656 02/03/22  1048 02/03/22  0436 02/02/22  2306   Sodium 131* 136 132* 134*   Potassium 5.1 4.3 4.5 4.7   Chloride 104 107 105 103   CO2 20 20 18 20    BUN 61.0* 47.0* 46.0* 44.0*   Creatinine 3.1* 2.4* 2.5* 2.8*   Glucose 265* 225* 427* 375*   Calcium 7.9* 8.1* 7.8* 8.0*   Magnesium 1.8 1.8 1.5*  --        Recent Labs   Lab 02/04/22  0656 02/03/22  0436 02/02/22  2306   ALT  --  8 9   AST (SGOT)  --  7 9   Bilirubin, Total  --  0.1* 0.1*   Albumin 1.9* 1.9* 2.1*   Alkaline Phosphatase  --  96 113       Recent Labs   Lab 02/04/22  0656 02/03/22  1041 02/03/22  0112   hs Troponin-I 13.9 19.2* 31.6*   hs Troponin-I Delta  --   --  0.7       Recent Labs   Lab 02/03/22  0436   PT INR 1.2*   PT 14.0*   PTT 41*       Microbiology Results (last 15 days)       Procedure Component Value Units Date/Time    COVID-19 (SARS-CoV-2) only (Liat Rapid) asymptomatic admission [[id number]] Collected: 02/03/22 0027    Order Status: Completed Specimen: Nasopharyngeal Updated: 02/03/22 0136     Purpose of COVID testing  Screening     SARS-CoV-2 Specimen Source Nasal Swab     SARS CoV 2 Overall Result Not Detected     Comment: __________________________________________________  -A result of "Detected" indicates POSITIVE for the    presence of SARS CoV-2 RNA  -A result of "Not  Detected" indicates NEGATIVE for the    presence of SARS CoV-2 RNA  __________________________________________________________  Test performed using the Roche cobas Liat SARS-CoV-2 assay. This assay is  only for use under the Food and Drug Administration's Emergency Use  Authorization. This is a real-time RT-PCR assay for the qualitative  detection of SARS-CoV-2 RNA. Viral nucleic acids may persist in vivo,  independent of viability. Detection of viral nucleic acid does not imply the  presence of infectious virus, or that virus nucleic acid is the cause of  clinical symptoms. Negative results do not preclude SARS-CoV-2 infection and  should not be used as the sole basis for diagnosis, treatment or other  patient management decisions. Negative results must be combined with  clinical observations, patient history, and/or epidemiological information.  Invalid results may be due to inhibiting substances in the specimen and  recollection should occur. Please see Fact Sheets for patients and providers  located:  WirelessDSLBlog.no         Narrative:      o Collect and clearly label specimen type:  o PREFERRED-Upper respiratory specimen: One Nasal Swab in  Transport Media.  o Hand deliver to laboratory ASAP  Indication for testing->Extended care facility admission to  semi private room  Screening             RADIOLOGY     Radiological Procedure xray personally reviewed and concur with radiologist reports unless stated otherwise.    Echocardiogram Adult Complete W Color Doppler Waveform   Final Result      XR Chest  AP Portable   Final Result    Negative for acute process             Gerlene Burdock, MD   02/02/2022 11:15 PM      [place] VenoDopp Low  Extremity Left   Final Result          1. Nonocclusive deep venous thrombosis of the LEFT lower extremity mid   and distal superficial femoral vein.      Critical results were discussed with Dr. Lorene Dy by Dr. Jackey Loge at 11:20   PM on 02/02/2022.      Judd Gaudier, MD   02/02/2022 11:30 PM          Signed,  Zacarias Pontes PA-C  1:55 PM 02/04/2022     This note was generated by the [facility] EMR system/Dragon speech recognition and may contain inherent errors or omissions not intended by the user. Grammatical errors, random word insertions, deletions, pronoun errors and incomplete sentences are occasional consequences of this technology due to software limitations. Not all errors are caught or corrected. If there are questions or concerns about the content of this note or information contained within the body of this dictation they should be addressed directly with the author for clarification.

## 2022-02-04 NOTE — Plan of Care (Signed)
This case Production designer, theatre/television/film provided patient with OP PT facility resource    [facility] BSN,RN  Clinical Case Manager I  [facility]  Case Management Department  [address]  [place], [place] [address]  Phone-[phone]  Fax     [phone]  [email or web address]

## 2022-02-04 NOTE — Progress Notes (Signed)
PROGRESS NOTE    Date Time: 02/04/22 9:55 AM  Patient Name: [name], [name]      Assessment:   1.Left Lower Ext DVT   2.HFrEF: In ASA , Bumex   3.h/o PE/DVT  4.CKD3  5. poorly controlled T2DM   6. diabetic retinopathy o  7.DM ON  insulin  8.uncontrolled HTN: ON Coreg   9.HLD: on Lipitor   10.OSA not on CPAP  11.fibromyalgia  12. OA  13.asthma: ON Symbicort   14anemia  15.depression/anxiety: ON Wellbutrin, Cymbalata   16. obesity class 3 (BMI>48  17. 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,  Plan:   -Cont with Eliquis   -Cont with IV Venofer  -Will DW Nephro for consideration of ESA  -Hb stable   -Pending ECHO results   Will cont to monitor the pt for treatment related toxicity   Cont plan of care   Subjective:   Pt was seen, pain is better, no CP or SOB   Medications:     Current Facility-Administered Medications   Medication Dose Route Frequency    apixaban  10 mg Oral Q12H SCH    Followed by    Melene Muller ON 02/09/2022] apixaban  5 mg Oral Q12H SCH    ARIPiprazole  10 mg Oral Daily    aspirin  81 mg Oral Daily    atorvastatin  80 mg Oral QHS    budesonide-formoterol  2 puff Inhalation BID    bumetanide  2 mg Oral BID    buPROPion XL  150 mg Oral Daily    carvedilol  25 mg Oral Q12H SCH    cetirizine  5 mg Oral QHS    diphenhydrAMINE  12.5 mg Intravenous Once    docusate sodium  100 mg Oral Q12H    DULoxetine  60 mg Oral Daily    fluticasone  2 spray Each Nare Daily    folic acid  1 mg Oral Daily    gabapentin  300 mg Oral BID    hydrALAZINE  75 mg Oral Q12H    insulin glargine  35 Units Subcutaneous Q12H    insulin lispro  1-4 Units Subcutaneous QHS    insulin lispro  1-8 Units Subcutaneous TID AC    insulin lispro  10 Units Subcutaneous TID AC    iron sucrose  200 mg Intravenous Q24H SCH    montelukast  10 mg Oral QHS    pantoprazole  40 mg Oral QAM AC    traZODone  150 mg Oral QHS    vitamin D (ergocalciferol)  50,000 Units Oral Weekly       Review of Systems:   Remainder of 10 point ROS as above or  otherwise negative    Physical Exam:     Vitals:    02/04/22 0750   BP: 110/68   Pulse: 71   Resp: 18   Temp: 97.9 F (36.6 C)   SpO2: 98%       Temperature: Temp  Min: 97.9 F (36.6 C)  Max: 98.6 F (37 C)  Pulse: Pulse  Min: 71  Max: 80  Respiratory: Resp  Min: 17  Max: 18  Non-Invasive BP: BP  Min: 102/69  Max: 147/86  Pulse Oximetry SpO2  Min: 95 %  Max: 98 %    Intake and Output Summary (Last 24 hours) at Date Time    Intake/Output Summary (Last 24 hours) at 02/04/2022 0955  Last data filed at 02/04/2022 0740  Gross per 24 hour  Intake 299 ml   Output 1730 ml   Net -1431 ml       GEN APPEARANCE: Normal;  A&OX3  HEENT: PERLA; EOMI; Conjunctiva Clear  NECK: Supple; No bruits  CVS: RRR, S1, S2; No M/G/R  LUNGS: CTAB; No Wheezes; No Rhonchi: No rales  ABD: Soft; No TTP; + Normoactive BS  EXT: No edema; Pulses 2+ and intact  SKIN: No rash or Lesions  NEURO: CN 2-12 intact; No Focal neurological deficits    Labs:     Results       Procedure Component Value Units Date/Time    Urinalysis Reflex to Microscopic Exam [[id number]]  (Abnormal) Collected: 02/04/22 0732    Specimen: Urine Updated: 02/04/22 2130     Urine Type Clean Catch     Color, UA Yellow     Clarity, UA Hazy     Specific Gravity UA 1.013     Urine pH 5.0     Leukocyte Esterase, UA Negative     Nitrite, UA Negative     Protein, UR >=500     Glucose, UA >=500     Ketones UA Negative     Urobilinogen, UA Normal mg/dL      Bilirubin, UA Negative     Blood, UA Small     RBC, UA 0 - 2 /hpf      WBC, UA 0 - 5 /hpf      Squamous Epithelial Cells, Urine 0 - 5 /hpf      Hyaline Casts, UA 4 - 5 /lpf     Glucose Whole Blood - POCT [[id number]]  (Abnormal) Collected: 02/04/22 0747     Updated: 02/04/22 0759     Whole Blood Glucose POCT 244 mg/dL     High Sensitivity Troponin-I (Every 6 hours) [[id number]] Collected: 02/04/22 0656    Specimen: Blood Updated: 02/04/22 0737     hs Troponin-I 13.9 ng/L     Protein / creatinine ratio, urine [[id number]] Collected: 02/04/22  0732    Specimen: Urine Updated: 02/04/22 0736    Urine Microalbumin Random [[id number]] Collected: 02/04/22 0732     Updated: 02/04/22 0736    Renal function panel [[id number]]  (Abnormal) Collected: 02/04/22 0656    Specimen: Blood Updated: 02/04/22 0731     Glucose 265 mg/dL      Sodium 564 mEq/L      Potassium 5.1 mEq/L      Chloride 104 mEq/L      CO2 20 mEq/L      BUN 61.0 mg/dL      Calcium 7.9 mg/dL      Creatinine 3.1 mg/dL      Albumin 1.9 g/dL      Phosphorus 5.7 mg/dL      Anion Gap 7.0    Magnesium [[id number]] Collected: 02/04/22 0656    Specimen: Blood Updated: 02/04/22 0731     Magnesium 1.8 mg/dL     GFR [[id number]] Collected: 02/04/22 0656     Updated: 02/04/22 0731     EGFR 16.4       CBC without differential [[id number]]  (Abnormal) Collected: 02/04/22 0656    Specimen: Blood Updated: 02/04/22 0712     WBC 10.90 x10 3/uL      Hgb 8.2 g/dL      Hematocrit 73.2 %      Platelets 322 x10 3/uL      RBC 3.08 x10 6/uL      MCV 84.4 fL      MCH 26.6 pg  MCHC 31.5 g/dL      RDW 12 %      MPV 10.2 fL      Nucleated RBC 0.0 /100 WBC      Absolute NRBC 0.00 x10 3/uL     PTH, Intact [[id number]] Collected: 02/04/22 0656    Specimen: Blood Updated: 02/04/22 0656    Narrative:      Red Top Only; SST Unacceptable    Glucose Whole Blood - POCT [[id number]]  (Abnormal) Collected: 02/03/22 2105     Updated: 02/03/22 2108     Whole Blood Glucose POCT 260 mg/dL     Glucose Whole Blood - POCT [[id number]]  (Abnormal) Collected: 02/03/22 1552     Updated: 02/03/22 1555     Whole Blood Glucose POCT 391 mg/dL     Magnesium [[id number]] Collected: 02/03/22 1048    Specimen: Blood Updated: 02/03/22 1202     Magnesium 1.8 mg/dL     GFR [[id number]] Collected: 02/03/22 1048     Updated: 02/03/22 1202     EGFR 22.1       Basic Metabolic Panel [[id number]]  (Abnormal) Collected: 02/03/22 1048    Specimen: Blood Updated: 02/03/22 1202     Glucose 225 mg/dL      BUN 44.0 mg/dL      Creatinine 2.4 mg/dL      Calcium 8.1 mg/dL       Sodium 347 mEq/L      Potassium 4.3 mEq/L      Chloride 107 mEq/L      CO2 20 mEq/L      Anion Gap 9.0    Glucose Whole Blood - POCT [[id number]]  (Abnormal) Collected: 02/03/22 1131     Updated: 02/03/22 1133     Whole Blood Glucose POCT 197 mg/dL     High Sensitivity Troponin-I [[id number]]  (Abnormal) Collected: 02/03/22 1041    Specimen: Blood Updated: 02/03/22 1111     hs Troponin-I 19.2 ng/L     Vitamin B12 [[id number]] Collected: 02/03/22 0436    Specimen: Blood Updated: 02/03/22 1053     Vitamin B-12 554 pg/mL     TSH [[id number]] Collected: 02/03/22 0436    Specimen: Blood Updated: 02/03/22 1047     TSH 2.94 uIU/mL     IRON PROFILE [[id number]]  (Abnormal) Collected: 02/03/22 0436     Updated: 02/03/22 1000     Iron 28 ug/dL      UIBC 542 ug/dL      TIBC 706 ug/dL      Iron Saturation 18 %     Hemolysis index [[id number]]  (Abnormal) Collected: 02/03/22 0436     Updated: 02/03/22 1000     Hemolysis Index 27 Index     Hemoglobin A1C [[id number]]  (Abnormal) Collected: 02/03/22 0436    Specimen: Blood Updated: 02/03/22 1000     Hemoglobin A1C 9.4 %      Average Estimated Glucose 223.1 mg/dL             Recent CBC   Recent Labs     02/04/22  0656   RBC 3.08*   Hgb 8.2*   Hematocrit 26.0*   MCV 84.4   MCH 26.6   MCHC 31.5   RDW 12   MPV 10.2     Recent BMP   Recent Labs     02/04/22  0656   Glucose 265*   BUN 61.0*   Creatinine 3.1*   Calcium 7.9*   Sodium 131*   Potassium 5.1  Chloride 104   CO2 20     Recent CMP   Recent Labs     02/04/22  0656   Glucose 265*   BUN 61.0*   Creatinine 3.1*   Sodium 131*   CO2 20   Calcium 7.9*   Albumin 1.9*     Recent CARDIAC ENZYMES No results for input(s): TROPONIN, ISTATTROPONI, CK, CKMB in the last 24 hours.    Invalid input(s): TROPONINT    Rads:   No results found.  Radiological Procedure reviewed.    Signed by: Robert Bellow, MD

## 2022-02-04 NOTE — Progress Notes (Signed)
[facility] PROGRESS NOTE     Date Time:    02/04/22    8:31 AM  Patient Name: [name]    LOS: 0 days     Subjective:  Patient denies chest pain and shortness of breath.  Reports lightheadedness and chronic palpations.     Scheduled Meds:   apixaban, 10 mg, Oral, Q12H SCH   Followed by  Melene Muller ON 02/09/2022] apixaban, 5 mg, Oral, Q12H SCH  ARIPiprazole, 10 mg, Oral, Daily  aspirin, 81 mg, Oral, Daily  atorvastatin, 80 mg, Oral, QHS  budesonide-formoterol, 2 puff, Inhalation, BID  bumetanide, 2 mg, Oral, BID  buPROPion XL, 150 mg, Oral, Daily  carvedilol, 25 mg, Oral, Q12H SCH  cetirizine, 5 mg, Oral, QHS  diphenhydrAMINE, 12.5 mg, Intravenous, Once  docusate sodium, 100 mg, Oral, Q12H  DULoxetine, 60 mg, Oral, Daily  fluticasone, 2 spray, Each Nare, Daily  folic acid, 1 mg, Oral, Daily  gabapentin, 300 mg, Oral, BID  hydrALAZINE, 75 mg, Oral, Q12H  insulin glargine, 35 Units, Subcutaneous, Q12H  insulin lispro, 1-4 Units, Subcutaneous, QHS  insulin lispro, 1-8 Units, Subcutaneous, TID AC  insulin lispro, 10 Units, Subcutaneous, TID AC  iron sucrose, 200 mg, Intravenous, Q24H SCH  montelukast, 10 mg, Oral, QHS  pantoprazole, 40 mg, Oral, QAM AC  traZODone, 150 mg, Oral, QHS  vitamin D (ergocalciferol), 50,000 Units, Oral, Weekly     Continuous Infusions:   PRN Meds: acetaminophen **OR** acetaminophen, albuterol sulfate HFA, baclofen, dextrose **OR** dextrose **OR** dextrose **OR** glucagon (rDNA), hydrOXYzine, naloxone, oxyCODONE, polyethylene glycol, promethazine **OR** promethazine    Objective:  Physical Exam:  BP 110/68   Pulse 71   Temp 97.9 F (36.6 C) (Oral)   Resp 18   Ht 1.676 m (5\' 6" )   Wt 135.4 kg (298 lb 8.1 oz)   SpO2 98%   BMI 48.18 kg/m      Intake/Output Summary (Last 24 hours) at 02/04/2022 0700  Last data filed at 02/03/2022 1623  Gross per 24 hour   Intake 299 ml   Output 830 ml   Net -531 ml     Wt Readings from Last 1 Encounters:   02/02/22 2109 135.4 kg (298 lb 8.1 oz)      General: awake, alert, oriented x 3, no acute distress.  Cardiovascular: regular rate and rhythm, no murmurs, rubs or gallops     Lungs: clear to auscultation bilaterally, without wheezing, rhonchi, or rales  Abdomen: soft, non-tender, non-distended, obese  Extremities: 1+ BLE edema     Lab Review:   Recent Labs     02/04/22  0656 02/03/22  0436 02/02/22  2306   WBC 10.90* 13.35* 14.98*   Hgb 8.2* 8.4* 9.0*   Hematocrit 26.0* 26.6* 28.2*   Platelets 322 336 356*     Recent Labs     02/04/22  0656 02/03/22  1041 02/03/22  0112 02/02/22  2306   hs Troponin-I 13.9 19.2* 31.6* 30.9*   hs Troponin-I Delta  --   --  0.7  --    NT-proBNP  --   --   --  572*    Recent Labs     02/04/22  0656 02/03/22  1048 02/03/22  0436   Sodium 131* 136 132*   Potassium 5.1 4.3 4.5   CO2 20 20 18    BUN 61.0* 47.0* 46.0*   Creatinine 3.1* 2.4* 2.5*   Glucose 265* 225* 427*   Magnesium 1.8 1.8 1.5*  Estimated Creatinine Clearance: 33.5 mL/min (A) (based on SCr of 3.1 mg/dL (H)).   Recent Labs     02/03/22  0436   PT INR 1.2*   TSH 2.94        Telemetry: SR, 80 bpm.  No cardiac events noted on telemetry overnight.      Imaging:  Radiology Results (24 Hour)       ** No results found for the last 24 hours. **            Assessment/Plan:   [name] is a 41 y.o. female with a history of HFpEF (Echo 03/2021 grade 1 diastolic dysfunction, LVEF 50 to 55%), HTN, HLD, DVT/PE (2021), CKD, OSA, vertigo, anemia, and asthma who presented to the hospital on 02/02/2022 with left leg pain.  Patient had right hand surgery on 12/06/2021.  She had held her Eliquis for 1 week prior to surgery and 4 days after surgery.  It was stopped again 3 weeks ago due to poor hand healing/bleeding.  Bilateral lower extremity dopplers this admission showed nonocclusive DVT of the left lower extremity mid and distal superficial femoral vein.     Acute on chronic HFpEF.  Mild, NT proBNP 572.   It appears mildly volume overloaded on exam.  She received IV Lasix  20 mg at 0314 this am.  Could be related to worsening renal function.  Left leg DVT. Nonocclusive DVT of the left lower extremity mid and distal superficial femoral vein.  AKI on CKD.  Baseline creatinine 1.6.  Mildly elevated hs troponin.  Peaked at 31.6.  Denies chest pain. Likely related to kidney failure and demand.   History of DVT/PE (2021).  Patient was on Eliquis 5 mg twice daily with interruption for hand surgery in January 2023 and discontinuation 3 weeks ago due to nonhealing left hand.  HTN.  Not well controlled carvedilol 25 mg twice daily, hydralazine 50 mg twice daily, and olmesartan.   HLD.  On atorvastatin 80 mg.  OSA  Vertigo  Anemia.  Hgb 8.4.   Hypomagnesia.  Magnesium 1.5.  Replaced.  Hyponatremia.  Sodium  on admit 132.  Resolved.   Asthma  DM type II.  Insulin-dependent.  Hemoglobin A1c (02/03/2022) 9.4.  History of dilated ascending aorta.     Echocardiogram to assess LV function.   Diuresis per nephrology. Creatinine up trending, 3.1 today.  Recommend holding diuretics today.   Strict I/Os, standing weights, replete electrolytes with goal K>4 and Mg>2.   Please check orthostatic blood pressures.    Hold Hydralazine as below.  Continue Carvedilol 25 mg twice daily.  Continue to hold olmesartan due to AKI.  Continue atorvastatin 80 mg daily and ASA 81 mg daily.  DVT treatment per primary team.  Eliquis 10 mg twice daily for 1 week, then 5 mg twice daily. Monitior Hgb closely.     Signed by: Netta Neat, FNP-BC  [facility]   [facility] [address] NP Spectralink [phone] or [phone] (M-F 8 am-5 pm)   [facility] Spectralink [phone]  (M-F 8 am-5 pm)  After Hours On-Call Physician: [phone]     Patient seen and examined. History, physical and pertinent test results reviewed. Agree with above assessment and plan with the following addendums:    General Appearance:  Alert, well-appearing obese female in no acute distress.    HEENT: Sclera anicteric, conjunctiva without  pallor, moist mucous membranes, normal dentition. No arcus.   Neck: Supple without jugular venous distention. Thyroid  nonpalpable. Normal carotid upstroke without bruits.   Chest: Clear to auscultation bilaterally with good air movement and respiratory effort and no wheezes, rales, or rhonchi.  Unlabored on RA.  Cardiovascular: Normal S1 and S2 without murmurs, gallops or rub. PMI of normal size and nondisplaced.     Abdomen: Soft, nontender, nondistended, with normoactive bowel sounds. No organomegaly.  No pulsatile masses, or bruits.   Extremities: Warm without clubbing or cyanosis.  Trace Lower extremity edema noted over bilateral ankles and feet.  All peripheral pulses are full and equal.   Skin: Normal coloration and turgor, no rash, xanthoma or xanthelasma.  Macular hyperpigmented lesions over bilateral shins.  Musculoskeletal: no joint tenderness, deformity or swelling.  Neuro: Alert and oriented x3. Grossly intact. Strength is symmetrical. Normal mood and affect.     Cr bumped from baseline / admission Cr due to receiving Lasix 40mg  IV and Bumex 2mg  tablet in 24 hour period.  Patient will get no diuretics today.  Patient orthostatic by symptoms today.  Hold ARB due to AKI on CKD3a.  Hold hydralazine unless BP > 160/80 mm hg.  Strict I/O's.  Daily standing weights.  Patient noted to have 3.0 sec pause during episode of nausea on 3/27.  No further pauses.    No bradycardia on Telemetry.  OK to continue Carvedilol 25mg  BID with holding parameters: Hold if HR < 55 bpm or Systolic BP < 100 mm Hg.  Continue telemetry.  ECHO rechecked shows LV EF 60-65% and no significant diastolic dysfunction or valvular abnormality.  I personally reviewed the ECHO and there does not appear to be regional wall motion abnormalities.  The estimated Right Ventricular systolic pressure of 29 mm Hg.  Recurrent Left lower extremity DVT when off of Eliquis for short time.  Rec'd restart Eliquis 10mg  BID as you are for symptomatic  DVT.  Rec'd Iliofemoral Venous ultrasound to r/o May-Thurner syndrome given recurrent nature of DVT.  Nephrology also following.    D/w RN.  D/w Hospital Med directly and via SecureChat  D/w Pt.    Total time spent on visit today 60 mins.    Signed by:  Mal Amabile, MD, [facility]  Interventional Cardiology  [facility]  [address], [address]  [place], [place]  [address]  Fax [phone]  Phone [phone]

## 2022-02-05 DIAGNOSIS — N179 Acute kidney failure, unspecified: Principal | ICD-10-CM

## 2022-02-05 DIAGNOSIS — I10 Essential (primary) hypertension: Secondary | ICD-10-CM

## 2022-02-05 LAB — RENAL FUNCTION PANEL
Albumin: 1.9 g/dL — ABNORMAL LOW (ref 3.5–5.0)
Anion Gap: 7 (ref 5.0–15.0)
BUN: 67 mg/dL — ABNORMAL HIGH (ref 7.0–21.0)
CO2: 22 mEq/L (ref 17–29)
Calcium: 7.8 mg/dL — ABNORMAL LOW (ref 8.5–10.5)
Chloride: 106 mEq/L (ref 99–111)
Creatinine: 3.2 mg/dL — ABNORMAL HIGH (ref 0.4–1.0)
Glucose: 106 mg/dL — ABNORMAL HIGH (ref 70–100)
Phosphorus: 5.9 mg/dL — ABNORMAL HIGH (ref 2.3–4.7)
Potassium: 4.8 mEq/L (ref 3.5–5.3)
Sodium: 135 mEq/L (ref 135–145)

## 2022-02-05 LAB — URINALYSIS REFLEX TO MICROSCOPIC EXAM - REFLEX TO CULTURE
Bilirubin, UA: NEGATIVE
Glucose, UA: 150 — AB
Ketones UA: NEGATIVE
Leukocyte Esterase, UA: NEGATIVE
Nitrite, UA: NEGATIVE
Protein, UR: >=500 — AB
Specific Gravity UA: 1.016 (ref 1.001–1.035)
Urine pH: 5 (ref 5.0–8.0)
Urobilinogen, UA: NORMAL mg/dL (ref 0.2–2.0)

## 2022-02-05 LAB — CBC
Absolute NRBC: 0 10*3/uL (ref 0.00–0.00)
Hematocrit: 26.4 % — ABNORMAL LOW (ref 34.7–43.7)
Hgb: 8.1 g/dL — ABNORMAL LOW (ref 11.4–14.8)
MCH: 26 pg (ref 25.1–33.5)
MCHC: 30.7 g/dL — ABNORMAL LOW (ref 31.5–35.8)
MCV: 84.6 fL (ref 78.0–96.0)
MPV: 10.2 fL (ref 8.9–12.5)
Nucleated RBC: 0 /100 WBC (ref 0.0–0.0)
Platelets: 346 10*3/uL (ref 142–346)
RBC: 3.12 10*6/uL — ABNORMAL LOW (ref 3.90–5.10)
RDW: 13 % (ref 11–15)
WBC: 11.59 10*3/uL — ABNORMAL HIGH (ref 3.10–9.50)

## 2022-02-05 LAB — GLUCOSE WHOLE BLOOD - POCT
Whole Blood Glucose POCT: 112 mg/dL — ABNORMAL HIGH (ref 70–100)
Whole Blood Glucose POCT: 127 mg/dL — ABNORMAL HIGH (ref 70–100)
Whole Blood Glucose POCT: 83 mg/dL (ref 70–100)
Whole Blood Glucose POCT: 149 mg/dL — ABNORMAL HIGH (ref 70–100)

## 2022-02-05 LAB — MAGNESIUM: Magnesium: 2 mg/dL (ref 1.6–2.6)

## 2022-02-05 LAB — GFR: EGFR: 15.8

## 2022-02-05 MED ORDER — HYDRALAZINE HCL 50 MG PO TABS
50.0000 mg | ORAL_TABLET | Freq: Two times a day (BID) | ORAL | Status: DC
Start: 2022-02-05 — End: 2022-02-06
  Administered 2022-02-05 – 2022-02-06 (×2): 50 mg via ORAL
  Filled 2022-02-05 (×2): qty 1

## 2022-02-05 MED ORDER — INSULIN GLARGINE 100 UNIT/ML SC SOLN
20.0000 [IU] | Freq: Once | SUBCUTANEOUS | Status: AC
Start: 2022-02-05 — End: 2022-02-05
  Administered 2022-02-05: 22:00:00 20 [IU] via SUBCUTANEOUS
  Filled 2022-02-05: qty 20

## 2022-02-05 NOTE — Progress Notes (Signed)
[facility]  PROGRESS NOTE      Patient: [name]  Date: 02/05/2022   LOS: 1 Days  Admission Date: 02/02/2022   MRN: [id number]  Attending:  MD     ASSESSMENT/PLAN     [name] is a 41 y.o. female admitted with       Nonocclusive DVT of LEFT lower extremity, provoked  Prior h/o PE/DVT, normally chronically on Eliquis  Leukocytosis, likely reactive  - US report shows LLE non-occlusive DVT. Right LE patent, no e/o DVT  - Eliquis re-started at 10mg  loading dose for next 7d, followed by 5mg   - Hypercoagulable workup as per heme as outpt  - Will need repeat doppler in 6 weeks to monitor clot  - Oxycodone 5mg  q6h prn pain  - Leukocytosis likely reactive 2/to DVT as no signs of infection     Acute on chronic HFpEF  Mildly elevated troponin likely demand ischemia  Suspected cardiorenal syndrome  Uncontrolled HTN  - Echo May 2022: LVEF 50-55%. G1DD. No significant valvular dysfunction.  - BNP mildly elevated, CXR without acute process  - Trop mildly elevated though flat x 2, will check 3rd in 6h  - S/p 2 doses of IV lasix thus far in house, mildly worsening renal fx today  - allow pt to self hydrate  -anti-hypertensive held this AM d/t hypotension  - adjusting hydralazine given episode of orthostasis while ambulating with PT, though the values are not recorded pt states her BP sitting was 123/70, and upon standing systolic went to 96, and she was dizzy  Her BP remains on soft side, hydralazine dose decreased by cards today, but I have held afternoon dose as well d/t hypotension.  Will d/w nephro about potentially reducing dose further, as her BP trends have been running low.    Orthostatics this AM were negative     HLD  - Continue atorvastatin 80mg  qHS     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  Sodium corrected with hyperglycemia, in range  Corrected calcium with hypoalbuminemia, in range  - Cr 3.2 today mildly worse, awaiting nephro follow up.    - hold morning oral diuretic, d/w nephro, would not give IVF as  she has nephrotic syndrome and would worsen edema  Nephrology consulted, appreciate input  - Trend renal function and lytes  - Renally dose medications, gabapentin dose reduced for renal fx  - may be NSAID induced, as pt states she was using Ibuprofen for past 2 weeks after her carpal tunnel surgery        Poorly controlled T2DM with hyperglycemia, diabetic retinopathy, on long-term insulin  - Continue Lantus at reduced dose 35u BID  - Continue preprandial Lispro at reduced dose 10u TID with meals  - Check BG before meals, at bedtime  - Add PSSI coverage or uptitrate as warranted  - Check A1c     Neuropathy  Fibromyalgia  OA  - Continue gabapentin, Cymbalta  - Baclofen prn muscle spasms     Anemia of chronic disease  - Hb stable around b/l 9  - Continue folic acid  - Check iron profile, vitamin B12  - Requires Aranesp monthly, occasional Venofer infusions per patient     Asthma, unspecified type  Seasonal and environmental allergies  Left otitis media with effusion  - Continue Dulera (sub Symbicort) scheduled, Albuterol prn  - Continue cetirizine, montelukast, Flonase     Depression/anxiety  - Continue Abilify, bupropion daily  - Continue trazodone for sleep  OSA not on CPAP, noted     Obesity Class 3, 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  - Lifestyle modifications recommended    Analgesia: oxycodone     Nutrition: diabetic/cardiac      Safety Checklist  DVT prophylaxis: Chemical and / or mechanical ppx NOT indicated or contraindicated: Fully anticoagulated    Foley: Not present   IVs:  Peripheral IV   PT/OT: Not needed   Daily CBC & or Chem ordered: Yes, due to clinical and lab instability       Patient Lines/Drains/Airways Status       Active PICC Line / CVC Line / PIV Line / Drain / Airway / Intraosseous Line / Epidural Line / ART Line / Line / Wound / Pressure Ulcer / NG/OG Tube       Name Placement date Placement time Site Days    Peripheral IV 02/04/22 22 G Anterior;Distal;Left Forearm 02/04/22  1832  Forearm  less than 1    Wound  12/05/21 Surgical Incision Arm Right 12/05/21  0837  Arm  60                    MD/RN rounds: yes       Code Status: full code    DISPO: home with family    Family Contact: daughter on phone    Care Plan discussed with nursing, consultants, case Production designer, theatre/television/film.       SUBJECTIVE     [name] states she still has dizziness when she stands, but slightly improved today    MEDICATIONS     Current Facility-Administered Medications   Medication Dose Route Frequency    apixaban  10 mg Oral Q12H SCH    Followed by    Melene Muller ON 02/09/2022] apixaban  5 mg Oral Q12H SCH    ARIPiprazole  10 mg Oral Daily    aspirin  81 mg Oral Daily    atorvastatin  80 mg Oral QHS    budesonide-formoterol  2 puff Inhalation BID    bumetanide  2 mg Oral BID    buPROPion XL  150 mg Oral Daily    carvedilol  25 mg Oral Q12H SCH    cetirizine  5 mg Oral QHS    diphenhydrAMINE  12.5 mg Intravenous Once    docusate sodium  100 mg Oral Q12H    DULoxetine  60 mg Oral Daily    fluticasone  2 spray Each Nare Daily    folic acid  1 mg Oral Daily    gabapentin  300 mg Oral BID    hydrALAZINE  75 mg Oral Q12H    insulin glargine  35 Units Subcutaneous Q12H    insulin lispro  1-4 Units Subcutaneous QHS    insulin lispro  1-8 Units Subcutaneous TID AC    insulin lispro  10 Units Subcutaneous TID AC    iron sucrose  200 mg Intravenous Q24H SCH    montelukast  10 mg Oral QHS    pantoprazole  40 mg Oral QAM AC    sodium chloride  250 mL Intravenous Once    traZODone  150 mg Oral QHS    vitamin D (ergocalciferol)  50,000 Units Oral Weekly       ROS     Remainder of 10 point ROS as above or otherwise negative    PHYSICAL EXAM     Vitals:    02/05/22 1308   BP: 136/84   Pulse: 81   Resp:  Temp:    SpO2:        Temperature: Temp  Min: 97.9 F (36.6 C)  Max: 98.4 F (36.9 C)  Pulse: Pulse  Min: 65  Max: 86  Respiratory: Resp  Min: 16  Max: 18  Non-Invasive BP: BP  Min: 87/55  Max: 143/83  Pulse Oximetry SpO2  Min: 93 %  Max: 99 %    Intake and Output Summary  (Last 24 hours) at Date Time    Intake/Output Summary (Last 24 hours) at 02/05/2022 1445  Last data filed at 02/05/2022 0100  Gross per 24 hour   Intake 240 ml   Output 400 ml   Net -160 ml       GEN APPEARANCE: Normal;  A&OX3, obese  HEENT: PERLA; EOMI; Conjunctiva Clear  NECK: Supple; No bruits  CVS: RRR, S1, S2; No M/G/R  LUNGS: CTAB; No Wheezes; No Rhonchi: No rales  ABD: Soft; No TTP; + Normoactive BS  EXT: No edema; Pulses 2+ and intact, normal capillary refill  SKIN: No rash or Lesions  NEURO: CN 2-12 intact; No Focal neurological deficits      LABS     Recent Labs   Lab 02/05/22  0744 02/04/22  0656 02/03/22  0436   WBC 11.59* 10.90* 13.35*   RBC 3.12* 3.08* 3.15*   Hgb 8.1* 8.2* 8.4*   Hematocrit 26.4* 26.0* 26.6*   MCV 84.6 84.4 84.4   Platelets 346 322 336       Recent Labs   Lab 02/05/22  0744 02/04/22  0656 02/03/22  1048 02/03/22  0436 02/02/22  2306   Sodium 135 131* 136 132* 134*   Potassium 4.8 5.1 4.3 4.5 4.7   Chloride 106 104 107 105 103   CO2 22 20 20 18 20    BUN 67.0* 61.0* 47.0* 46.0* 44.0*   Creatinine 3.2* 3.1* 2.4* 2.5* 2.8*   Glucose 106* 265* 225* 427* 375*   Calcium 7.8* 7.9* 8.1* 7.8* 8.0*   Magnesium 2.0 1.8 1.8 1.5*  --        Recent Labs   Lab 02/05/22  0744 02/04/22  0656 02/03/22  0436 02/02/22  2306   ALT  --   --  8 9   AST (SGOT)  --   --  7 9   Bilirubin, Total  --   --  0.1* 0.1*   Albumin 1.9* 1.9* 1.9* 2.1*   Alkaline Phosphatase  --   --  96 113       Recent Labs   Lab 02/04/22  0656 02/03/22  1041 02/03/22  0112   hs Troponin-I 13.9 19.2* 31.6*   hs Troponin-I Delta  --   --  0.7       Recent Labs   Lab 02/03/22  0436   PT INR 1.2*   PT 14.0*   PTT 41*       Microbiology Results (last 15 days)       Procedure Component Value Units Date/Time    COVID-19 (SARS-CoV-2) only (Liat Rapid) asymptomatic admission [[id number]] Collected: 02/03/22 0027    Order Status: Completed Specimen: Nasopharyngeal Updated: 02/03/22 0136     Purpose of COVID testing Screening     SARS-CoV-2  Specimen Source Nasal Swab     SARS CoV 2 Overall Result Not Detected     Comment: __________________________________________________  -A result of "Detected" indicates POSITIVE for the    presence of SARS CoV-2 RNA  -A result of "Not Detected"  indicates NEGATIVE for the    presence of SARS CoV-2 RNA  __________________________________________________________  Test performed using the Roche cobas Liat SARS-CoV-2 assay. This assay is  only for use under the Food and Drug Administration's Emergency Use  Authorization. This is a real-time RT-PCR assay for the qualitative  detection of SARS-CoV-2 RNA. Viral nucleic acids may persist in vivo,  independent of viability. Detection of viral nucleic acid does not imply the  presence of infectious virus, or that virus nucleic acid is the cause of  clinical symptoms. Negative results do not preclude SARS-CoV-2 infection and  should not be used as the sole basis for diagnosis, treatment or other  patient management decisions. Negative results must be combined with  clinical observations, patient history, and/or epidemiological information.  Invalid results may be due to inhibiting substances in the specimen and  recollection should occur. Please see Fact Sheets for patients and providers  located:  WirelessDSLBlog.no         Narrative:      o Collect and clearly label specimen type:  o PREFERRED-Upper respiratory specimen: One Nasal Swab in  Transport Media.  o Hand deliver to laboratory ASAP  Indication for testing->Extended care facility admission to  semi private room  Screening             RADIOLOGY     Radiological Procedure xray personally reviewed and concur with radiologist reports unless stated otherwise.    Echocardiogram Adult Complete W Color Doppler Waveform   Final Result      XR Chest  AP Portable   Final Result    Negative for acute process             Gerlene Burdock, MD   02/02/2022 11:15 PM      [place] VenoDopp Low Extremity Left   Final  Result          1. Nonocclusive deep venous thrombosis of the LEFT lower extremity mid   and distal superficial femoral vein.      Critical results were discussed with Dr. Lorene Dy by Dr. Jackey Loge at 11:20   PM on 02/02/2022.      Judd Gaudier, MD   02/02/2022 11:30 PM          Signed,  Zacarias Pontes PA-C  2:45 PM 02/05/2022     This note was generated by the [facility] EMR system/Dragon speech recognition and may contain inherent errors or omissions not intended by the user. Grammatical errors, random word insertions, deletions, pronoun errors and incomplete sentences are occasional consequences of this technology due to software limitations. Not all errors are caught or corrected. If there are questions or concerns about the content of this note or information contained within the body of this dictation they should be addressed directly with the author for clarification.

## 2022-02-05 NOTE — UM Notes (Addendum)
02/04/22 1346  Admit to Inpatient  Once        Diagnosis: Aki (Acute Kidney Injury)    Level of Care: Acute    Patient Class: Inpatient       References:    [facility] Bed Placement Criteria    [facility] Bed Placement Criteria    [facility] Bed Placement Criteria    [facility] Bed Placement Criteria    [facility] Bed Placement Criteria    New Service Definitions Feb 2023   Question Answer Comment   Admitting Physician Ronney Lion S    Service: Medicine    Estimated Length of Stay > or = to 2 midnights    Tentative Discharge Plan? Home or Self Care    Does patient need telemetry? No            [facility]  HOSPITALIST  PROGRESS NOTE      Patient: [name]  DOB August 29, 1981  Date: 02/04/2022   LOS: 0 Days  Admission Date: 02/02/2022   MRN: [id number]  Attending:  MD      ASSESSMENT/PLAN      [name] is a 41 y.o. 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 Right LE patent, no e/o DVT  - Eliquis re-started at 10mg  loading dose for next 7d, followed by 5mg   - Hypercoagulable workup as per heme as outpt  - Will need repeat doppler in 6 weeks to monitor clot  - Oxycodone 5mg  q6h prn pain  - Leukocytosis likely reactive 2/to DVT as no signs of infection     Acute on chronic HFpEF  Mildly elevated troponin likely demand ischemia  Suspected cardiorenal syndrome  Uncontrolled HTN  - Echo May 2022: LVEF 50-55%. G1DD. No significant valvular dysfunction.  - BNP mildly elevated, CXR without acute process  - Trop mildly elevated though flat x 2, will check 3rd in 6h  - S/p 2 doses of IV lasix thus far in house, mildly worsening renal fx today  - allow pt to self hydrate  - holding bumex this AM and likely PM  - Continue carvedilol, low dose aspirin  - adjusting hydralazine given episode of orthostasis while ambulating  with PT, though the values are not recorded pt states her BP sitting was 123/70, and upon standing systolic went to 96, and she was dizzy  On repeat orthostatics, she is not orthostatic, so decision is made not to give IVF< but to hold hydralazine this afternoon, and can recheck orthostatics if positive again would reduce hydralazine all together  - Hold olmesartan, metolazone due to AKI  - Appreciate cardiology input     HLD  - Continue atorvastatin 80mg  qHS     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  Sodium corrected with hyperglycemia, in range  Corrected calcium with hypoalbuminemia, in range  - Cr 3.1 today, worsened from yesterday's value  - hold morning oral diuretic, d/w nephro, would not give IVF as she has nephrotic syndrome and would worsen edema  Nephrology consulted, appreciate input  - Trend renal function and lytes  - Renally dose medications, gabapentin dose reduced for renal fx  - may be NSAID induced, as pt states she was using Ibuprofen for past 2 weeks after her carpal tunnel surgery  Poorly controlled T2DM with hyperglycemia, diabetic retinopathy, on long-term insulin  - Continue Lantus at reduced dose 35u BID  - Continue preprandial Lispro at reduced dose 10u TID with meals  - Check BG before meals, at bedtime  - Add PSSI coverage or uptitrate as warranted  - Check A1c     Neuropathy  Fibromyalgia  OA  - Continue gabapentin, Cymbalta  - Baclofen prn muscle spasms     Anemia of chronic disease  - Hb stable around b/l 9  - Continue folic acid  - Check iron profile, vitamin B12  - Requires Aranesp monthly, occasional Venofer infusions per patient     Asthma, unspecified type  Seasonal and environmental allergies  Left otitis media with effusion  - Continue Dulera (sub Symbicort) scheduled, Albuterol prn  - Continue cetirizine, montelukast, Flonase     Depression/anxiety  - Continue Abilify, bupropion daily  - Continue trazodone for sleep     OSA not on CPAP, noted           Lab 02/04/22  0656 02/03/22  0436  02/02/22  2306   WBC 10.90* 13.35* 14.98*   RBC 3.08* 3.15* 3.35*   Hgb 8.2* 8.4* 9.0*   Hematocrit 26.0* 26.6* 28.2*   MCV 84.4 84.4 84.2   Platelets 322 336 356*                Recent Labs   Lab 02/04/22  0656 02/03/22  1048 02/03/22  0436 02/02/22  2306   Sodium 131* 136 132*      BUN 61.0, Creatanine 3.1, Glucose 265, Ca 7.9, Albumin 1.9,   MEDICATIONS             Current Facility-Administered Medications   Medication Dose Route Frequency    apixaban  10 mg Oral Q12H SCH     Followed by    Melene Muller ON 02/09/2022] apixaban  5 mg Oral Q12H SCH    ARIPiprazole  10 mg Oral Daily    aspirin  81 mg Oral Daily    atorvastatin  80 mg Oral QHS    budesonide-formoterol  2 puff Inhalation BID    bumetanide  2 mg Oral BID    buPROPion XL  150 mg Oral Daily    carvedilol  25 mg Oral Q12H SCH    cetirizine  5 mg Oral QHS    diphenhydrAMINE  12.5 mg Intravenous Once    docusate sodium  100 mg Oral Q12H    DULoxetine  60 mg Oral Daily    fluticasone  2 spray Each Nare Daily    folic acid  1 mg Oral Daily    gabapentin  300 mg Oral BID    hydrALAZINE  75 mg Oral Q12H    insulin glargine  35 Units Subcutaneous Q12H    insulin lispro  1-4 Units Subcutaneous QHS    insulin lispro  1-8 Units Subcutaneous TID AC    insulin lispro  10 Units Subcutaneous TID AC    iron sucrose  200 mg Intravenous Q24H SCH    montelukast  10 mg Oral QHS    pantoprazole  40 mg Oral QAM AC    sodium chloride  250 mL Intravenous Once    traZODone  150 mg Oral QHS    vitamin D (ergocalciferol)  50,000 Units Oral Weekly        Patient: [name]  Date: 02/05/2022   LOS: 1 Days  Admission Date: 02/02/2022   MRN: [id number]  Attending:  MD      ASSESSMENT/PLAN      [name] is a 41 y.o. 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 Right LE patent, no e/o DVT  - Eliquis re-started at 10mg   loading dose for next 7d, followed by 5mg   - Hypercoagulable workup as per heme as outpt  - Will need repeat doppler in 6 weeks to monitor clot  - Oxycodone 5mg  q6h prn pain  - Leukocytosis likely reactive 2/to DVT as no signs of infection     Acute on chronic HFpEF  Mildly elevated troponin likely demand ischemia  Suspected cardiorenal syndrome  Uncontrolled HTN  - Echo May 2022: LVEF 50-55%. G1DD. No significant valvular dysfunction.  - BNP mildly elevated, CXR without acute process  - Trop mildly elevated though flat x 2, will check 3rd in 6h  - S/p 2 doses of IV lasix thus far in house, mildly worsening renal fx today  - allow pt to self hydrate  -anti-hypertensive held this AM d/t hypotension  - adjusting hydralazine given episode of orthostasis while ambulating with PT, though the values are not recorded pt states her BP sitting was 123/70, and upon standing systolic went to 96, and she was dizzy  Her BP remains on soft side, hydralazine dose decreased by cards today, but I have held afternoon dose as well d/t hypotension.  Will d/w nephro about potentially reducing dose further, as her BP trends have been running low.    Orthostatics this AM were negative     HLD  - Continue atorvastatin 80mg  qHS     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  Sodium corrected with hyperglycemia, in range  Corrected calcium with hypoalbuminemia, in range  - Cr 3.2 today mildly worse, awaiting nephro follow up.    - hold morning oral diuretic, d/w nephro, would not give IVF as she has nephrotic syndrome and would worsen edema  Nephrology consulted, appreciate input  - Trend renal function and lytes  - Renally dose medications, gabapentin dose reduced for renal fx  - may be NSAID induced, as pt states she was using Ibuprofen for past 2 weeks after her carpal tunnel surgery        Poorly controlled T2DM with hyperglycemia, diabetic retinopathy, on long-term insulin  - Continue Lantus at reduced dose 35u BID  - Continue preprandial Lispro at reduced  dose 10u TID with meals  - Check BG before meals, at bedtime  - Add PSSI coverage or uptitrate as warranted  - Check A1c     Neuropathy  Fibromyalgia  OA  - Continue gabapentin, Cymbalta  - Baclofen prn muscle spasms     Anemia of chronic disease  - Hb stable around b/l 9  - Continue folic acid  - Check iron profile, vitamin B12  - Requires Aranesp monthly, occasional Venofer infusions per patient     Asthma, unspecified type  Seasonal and environmental allergies  Left otitis media with effusion  - Continue Dulera (sub Symbicort) scheduled, Albuterol prn  - Continue cetirizine, montelukast, Flonase     Depression/anxiety  - Continue Abilify, bupropion daily  - Continue trazodone for sleep     OSA not on CPAP, noted     Obesity Class 3, 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  - Lifestyle modifications recommended     Analgesia: oxycodone  Nutrition: diabetic/cardiac         FU: AKI/CKD     Assessment:     AKI/CKD-3b  Presenting creatinine is 2.8  Baseline creatinine appears to be mid 1s    Plan:     Creatinine: 2.8-> 2.4-> 3.1  CKD w/up pending  Proteinuria w/up  Oral bumex 2 mg bid  Renally dosed gabapentin  Coreg 25 mg bid and hydralazine 75 mg bid  IV Fe  Vit D supplementation  Needs better DM control  Eliquis  Hold RAASi for now  Daily weights  Strict intake and output  Labs/supportive care  Outpt renal FU     Echo pending     @ risk for needing RRT - D/W patient     Nephrotic syndrome contributing to hypervolemia   Unable to use RAASi/MRA/SGLT-2i due to worsening renal function  Please use lasix as needed in meantime for volume control   Date Time: 02/04/22 9:55 AM  Patient Name: CIGI, BEGA        Assessment:   1.Left Lower Ext DVT   2.HFrEF: In ASA , Bumex   3.h/o PE/DVT  4.CKD3  5. poorly controlled T2DM   6. diabetic retinopathy o  7.DM ON  insulin  8.uncontrolled HTN: ON Coreg   9.HLD: on Lipitor   10.OSA not on CPAP  11.fibromyalgia  12. OA  13.asthma: ON Symbicort   14anemia  15.depression/anxiety: ON Wellbutrin, Cymbalata    16. obesity class 3 (BMI>48  17. 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1* 2.4* 2.5* 2.8*   Glucose 106* 265* 225* 427* 375*   Calcium 7.8* 7.9* 8.1* 7.8*      Report  Scheduled    Medication Ordered Dose/Rate, Route, Frequency Last Action   apixaban (ELIQUIS) tablet 10 mg (Followed by Linked Group #1) 10 mg, PO, Q12H SCH Given, 10 mg at 03/29 0904   apixaban (ELIQUIS) tablet 5 mg (Followed by Linked Group #1) 5 mg, PO, Q12H SCH Ordered   ARIPiprazole (ABILIFY) tablet 10 mg 10 mg, PO, Daily Given, 10 mg at 03/29 1610   aspirin chewable tablet 81 mg 81 mg, PO, Daily Given, 81 mg at 03/29 0841   atorvastatin (LIPITOR) tablet 80 mg 80 mg, PO, QHS Given, 80 mg at 03/28 2117   budesonide-formoterol (SYMBICORT) 160-4.5 MCG/ACT inhaler 2 puff 2 puff, IN, BID Given, 2 puff at 03/29 0838   buPROPion XL (WELLBUTRIN XL) 24 hr tablet 150 mg 150 mg, PO, Daily Given, 150 mg at 03/29 0843   carvedilol (COREG) tablet 25 mg 25 mg, PO, Q12H SCH Given, 25 mg at 03/28 2053   cetirizine (ZyrTEC) tablet 5 mg 5 mg, PO, QHS Given, 5 mg  at 03/28 2117   diphenhydrAMINE (BENADRYL) injection 12.5 mg 12.5 mg, IV, Once Ordered   docusate sodium (COLACE) capsule 100 mg 100 mg, PO, Q12H Given, 100 mg at 03/29 0841   DULoxetine (CYMBALTA) DR capsule 60 mg 60 mg, PO, Daily Given, 60 mg at 03/29 0843   fluticasone (FLONASE) 50 MCG/ACT nasal spray 2 spray 2  spray, EACH NARE, Daily Given, 2 spray at 03/29 0840   folic acid (FOLVITE) tablet 1 mg 1 mg, PO, Daily Given, 1 mg at 03/29 0842   gabapentin (NEURONTIN) capsule 300 mg 300 mg, PO, BID Given, 300 mg at 03/29 0842   hydrALAZINE (APRESOLINE) tablet 50 mg 50 mg, PO, Q12H Ordered   insulin glargine (LANTUS) injection 35 Units 35 Units, SC, Q12H Given, 35 Units at 03/29 0839   insulin lispro injection 1-4 Units (And Linked Group #2) 1-4 Units, SC, QHS Given, 2 Units at 03/28 2217   insulin lispro injection 1-8 Units (And Linked Group #3) 1-8 Units, SC, TID AC Given, 3 Units at 03/28 1720   insulin lispro injection 10 Units 10 Units, SC, TID AC Given, 10 Units at 03/29 1244   iron sucrose (VENOFER) injection 200 mg 200 mg, IV, Q24H [facility] Given, 200 mg at 03/29 0858   montelukast (SINGULAIR) tablet 10 mg 10 mg, PO, QHS Given, 10 mg at 03/28 2117   pantoprazole (PROTONIX) EC tablet 40 mg 40 mg, PO, QAM AC Given, 40 mg at 03/29 0843   sodium chloride 0.9 % bolus 250 mL 250 mL, IV, Once Ordered   traZODone (DESYREL) tablet 150 mg 150 mg, PO, QHS Given, 150 mg at 03/28 2116   vitamin D (ergocalciferol) (DRISDOL) capsule 50,000 Units 50,000 Units, PO, Weekly Given, 50,000 Units at 03/27 1621     PRN    Medication Ordered Dose/Rate, Route, Frequency Last Action   acetaminophen (TYLENOL) suppository 650 mg (Or Linked Group #4) 650 mg, RE, Q6H PRN See Alternative, 03/27 0911   acetaminophen (TYLENOL) tablet 650 mg (Or Linked Group #4) 650 mg, PO, Q6H PRN Ordered   albuterol sulfate HFA (PROVENTIL) inhaler 2 puff 2 puff, IN, Q6H PRN Ordered   baclofen (LIORESAL) tablet 10 mg 10 mg, PO, TID PRN Ordered   dextrose (D10W) 10% bolus 125 mL (Or Linked Group #5) 12.5 g, IV, PRN Ordered   dextrose (GLUCOSE) 40 % oral gel 15 g of glucose (Or Linked Group #5) 15 g of glucose, PO, PRN Ordered   dextrose 50 % bolus 12.5 g (Or Linked Group #5) 12.5 g, IV, PRN Ordered   glucagon (rDNA) (GLUCAGEN) injection 1 mg (Or Linked Group #5) 1 mg, IM, PRN  Ordered   hydrOXYzine (ATARAX) tablet 10 mg 10 mg, PO, Q6H PRN Ordered   naloxone (NARCAN) injection 0.2 mg 0.2 mg, IV, PRN Ordered   oxyCODONE (ROXICODONE) immediate release tablet 5 mg 5 mg, PO, Q6H PRN Given, 5 mg at 03/29 0631   polyethylene glycol (MIRALAX) packet 17 g 17 g, PO, Daily PRN Ordered   promethazine (PHENERGAN) suppository 25 mg (Or Linked Group #6) 25 mg, RE, Q6H PRN See Alternative, 03/29 0904   promethazine (PHENERGAN) tablet 25 mg (Or Linked Group #6) 25 mg, PO, Q6H PRN Given, 25 mg at 03/29 0904

## 2022-02-05 NOTE — Progress Notes (Signed)
PROGRESS NOTE    Date Time: 02/05/22 1:04 PM  Patient Name: [name], [name]      Assessment:   1.Left Lower Ext DVT   2.HFrEF: In ASA , Bumex   3.h/o PE/DVT  4.CKD3  5. poorly controlled T2DM   6. diabetic retinopathy o  7.DM ON  insulin  8.uncontrolled HTN: ON Coreg   9.HLD: on Lipitor   10.OSA not on CPAP  11.fibromyalgia  12. OA  13.asthma: ON Symbicort   14anemia  15.depression/anxiety: ON Wellbutrin, Cymbalata   16. obesity class 3 (BMI>48  17. 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,  Plan:   -Cont with Eliquis   -Pending Myeloma panel   -Cont with IV Venofer  -Will DW Nephro for consideration of ESA  -Hb stable   Will cont to monitor the pt for treatment related toxicity   Cont plan of care   Subjective:   Pt was seen, pain is better, no CP or SOB   Medications:     Current Facility-Administered Medications   Medication Dose Route Frequency    apixaban  10 mg Oral Q12H SCH    Followed by    Melene Muller ON 02/09/2022] apixaban  5 mg Oral Q12H SCH    ARIPiprazole  10 mg Oral Daily    aspirin  81 mg Oral Daily    atorvastatin  80 mg Oral QHS    budesonide-formoterol  2 puff Inhalation BID    bumetanide  2 mg Oral BID    buPROPion XL  150 mg Oral Daily    carvedilol  25 mg Oral Q12H SCH    cetirizine  5 mg Oral QHS    diphenhydrAMINE  12.5 mg Intravenous Once    docusate sodium  100 mg Oral Q12H    DULoxetine  60 mg Oral Daily    fluticasone  2 spray Each Nare Daily    folic acid  1 mg Oral Daily    gabapentin  300 mg Oral BID    hydrALAZINE  75 mg Oral Q12H    insulin glargine  35 Units Subcutaneous Q12H    insulin lispro  1-4 Units Subcutaneous QHS    insulin lispro  1-8 Units Subcutaneous TID AC    insulin lispro  10 Units Subcutaneous TID AC    iron sucrose  200 mg Intravenous Q24H SCH    montelukast  10 mg Oral QHS    pantoprazole  40 mg Oral QAM AC    sodium chloride  250 mL Intravenous Once    traZODone  150 mg Oral QHS    vitamin D (ergocalciferol)  50,000 Units Oral Weekly       Review of Systems:    Remainder of 10 point ROS as above or otherwise negative    Physical Exam:     Vitals:    02/05/22 1258   BP: 142/87   Pulse: 80   Resp:    Temp:    SpO2:        Temperature: Temp  Min: 97.9 F (36.6 C)  Max: 98.4 F (36.9 C)  Pulse: Pulse  Min: 65  Max: 86  Respiratory: Resp  Min: 16  Max: 18  Non-Invasive BP: BP  Min: 87/55  Max: 143/83  Pulse Oximetry SpO2  Min: 93 %  Max: 100 %    Intake and Output Summary (Last 24 hours) at Date Time    Intake/Output Summary (Last 24 hours) at 02/05/2022 1304  Last data filed at  02/05/2022 0100  Gross per 24 hour   Intake 240 ml   Output 400 ml   Net -160 ml       GEN APPEARANCE: Normal;  A&OX3  HEENT: PERLA; EOMI; Conjunctiva Clear  NECK: Supple; No bruits  CVS: RRR, S1, S2; No M/G/R  LUNGS: CTAB; No Wheezes; No Rhonchi: No rales  ABD: Soft; No TTP; + Normoactive BS  EXT: No edema; Pulses 2+ and intact  SKIN: No rash or Lesions  NEURO: CN 2-12 intact; No Focal neurological deficits    Labs:     Results       Procedure Component Value Units Date/Time    Urinalysis Reflex to Microscopic Exam- Reflex to Culture [[id number]]  (Abnormal) Collected: 02/05/22 1130     Updated: 02/05/22 1157     Urine Type Urine, Clean Ca     Color, UA Yellow     Clarity, UA Hazy     Specific Gravity UA 1.016     Urine pH 5.0     Leukocyte Esterase, UA Negative     Nitrite, UA Negative     Protein, UR >=500     Glucose, UA 150     Ketones UA Negative     Urobilinogen, UA Normal mg/dL      Bilirubin, UA Negative     Blood, UA Small     RBC, UA 0 - 2 /hpf      WBC, UA 0 - 5 /hpf      Granular Casts, UA 3 - 5 /lpf      Hyaline Casts, UA 0 - 3 /lpf     Glucose Whole Blood - POCT [[id number]]  (Abnormal) Collected: 02/05/22 1130     Updated: 02/05/22 1135     Whole Blood Glucose POCT 127 mg/dL     Renal function panel [[id number]]  (Abnormal) Collected: 02/05/22 0744    Specimen: Blood Updated: 02/05/22 0815     Glucose 106 mg/dL      Sodium 086 mEq/L      Potassium 4.8 mEq/L      Chloride 106 mEq/L       CO2 22 mEq/L      BUN 67.0 mg/dL      Calcium 7.8 mg/dL      Creatinine 3.2 mg/dL      Albumin 1.9 g/dL      Phosphorus 5.9 mg/dL      Anion Gap 7.0    Magnesium [[id number]] Collected: 02/05/22 0744    Specimen: Blood Updated: 02/05/22 0815     Magnesium 2.0 mg/dL     GFR [[id number]] Collected: 02/05/22 0744     Updated: 02/05/22 0815     EGFR 15.8       CBC without differential [[id number]]  (Abnormal) Collected: 02/05/22 0744    Specimen: Blood Updated: 02/05/22 0755     WBC 11.59 x10 3/uL      Hgb 8.1 g/dL      Hematocrit 64.4 %      Platelets 346 x10 3/uL      RBC 3.12 x10 6/uL      MCV 84.6 fL      MCH 26.0 pg      MCHC 30.7 g/dL      RDW 13 %      MPV 10.2 fL      Nucleated RBC 0.0 /100 WBC      Absolute NRBC 0.00 x10 3/uL     Free Kappa and Lambda Light Chains w Ratio [  [id number]] Collected: 02/05/22 0744    Specimen: Blood Updated: 02/05/22 0750    Glucose Whole Blood - POCT [[id number]]  (Abnormal) Collected: 02/05/22 0743     Updated: 02/05/22 0746     Whole Blood Glucose POCT 112 mg/dL     Protein electrophoresis, serum [[id number]] Collected: 02/05/22 0744    Specimen: Blood Updated: 02/05/22 0744    Narrative:      Pro Electro Serum    Immunofixation electrophoresis, Serum [[id number]] Collected: 02/05/22 0744    Specimen: Blood Updated: 02/05/22 0744    Glucose Whole Blood - POCT [[id number]]  (Abnormal) Collected: 02/04/22 2203     Updated: 02/04/22 2213     Whole Blood Glucose POCT 209 mg/dL     Glucose Whole Blood - POCT [[id number]]  (Abnormal) Collected: 02/04/22 1624     Updated: 02/04/22 1628     Whole Blood Glucose POCT 202 mg/dL             Recent CBC   Recent Labs     02/05/22  0744   RBC 3.12*   Hgb 8.1*   Hematocrit 26.4*   MCV 84.6   MCH 26.0   MCHC 30.7*   RDW 13   MPV 10.2     Recent BMP   Recent Labs     02/05/22  0744   Glucose 106*   BUN 67.0*   Creatinine 3.2*   Calcium 7.8*   Sodium 135   Potassium 4.8   Chloride 106   CO2 22     Recent CMP   Recent Labs     02/05/22  0744   Glucose 106*    BUN 67.0*   Creatinine 3.2*   Sodium 135   CO2 22   Calcium 7.8*   Albumin 1.9*     Recent CARDIAC ENZYMES No results for input(s): TROPONIN, ISTATTROPONI, CK, CKMB in the last 24 hours.    Invalid input(s): TROPONINT    Rads:   No results found.  Radiological Procedure reviewed.    Signed by: Robert Bellow, MD

## 2022-02-05 NOTE — PT Progress Note (Signed)
Physical Therapy Treatment   [name]    Unit: [facility]  Bed: U440/H474-25      Post Acute Care Therapy Recommendations:   Discharge Recommendations:  Home with supervision  D/C Milestones: Patient is safe to return home with family assistance today.    DME needs IF patient is discharging home: Front wheel walker (bariatric)    Therapy discharge recommendations may change with patient status.  Please refer to most recent note for up-to-date recommendations.    Recommended (non-medical) mode of transportation at discharge:   car    PMP - Progressive Mobility Protocol   PMP Activity: Step 7 - Walks out of Room  Distance Walked (ft) (Step 6,7): 120 Feet    Assessment:      Pt is able to ambulate with or without device with supervision with slow gait and fatigues easily for age.    BP in supine 129/77, HR 85  BP in sitting 130/82, HR 85  BP in standing 134/77, HR 85  BP after ambulation in sitting 155/86, HR 91  SpO2 on room air after ambulation is 97%       Prognosis: Good;With continued PT status post acute discharge    Interdisciplinary Communication:   Patient up in bed with alarm activated; call bell within reach. Updated white communication board in room with patient's current mobility status .  Communicated with RN, via direct contact regarding patient's mobility status.    Education:   Further education provided to patient  on safety with mobility and ADLs, , falls prevention.  Demonstrated  understanding with verbal cues, demonstration and practice of skills.  Will benefit from further training to focus on bed mobility, out of bed activity, transfers, gait and exs.      Plan:   Goals  Pt Will Transfer Bed/Chair: independent;Not met  Pt Will Ambulate: > 200 feet;with rolling walker;modified independent;Not met      Plan  Risks/Benefits/POC Discussed with Pt/Family: With patient  Patient Goal: home  Treatment/Interventions: Exercise;Gait training;Stair training;Neuromuscular re-education;LE  strengthening/ROM  PT Frequency: 3-4x/wk    Continue plan of care.    ------------------------------------------------------------------------------------------------------------------    Time of treatment:   Start Time: 1510 Stop Time: 1540  Time Calculation (min): 30 min  PT Received On: 02/05/22      Treatment # 3      Precautions  Other Precautions: falls, R hand surgery                        Patient's medical condition is appropriate for Physical Therapy intervention at this time.     Subjective: "I need hand therapy once I leave here!"  Nursing clears patient for therapy.  Patient Goal: home   Pain Assessment  Pain Assessment: No/denies pain                    Objective:  Patient is in bed with no medical equipment in place.    Blood pressure 136/84, pulse 81, temperature 97.9 F (36.6 C), temperature source Oral, resp. rate 16, height 1.676 m (5\' 6" ), weight 135.6 kg (299 lb), SpO2 96 %.    Lab Results   Component Value Date    WBC 11.59 (H) 02/05/2022    HGB 8.1 (L) 02/05/2022    HCT 26.4 (L) 02/05/2022    MCV 84.6 02/05/2022    PLT 346 02/05/2022            Cognition/Neuro Status  Orientation  Level: Oriented X4  Behavior: calm;cooperative    Functional Mobility  Rolling: Independent  Supine to Sit: Independent  Scooting to HOB: Independent  Scooting to EOB: Independent  Sit to Supine: Independent  Sit to Stand: Independent  Stand to Sit: Independent    Transfers  Stand Pivot Transfers: Supervision    Locomotion  Ambulation: Supervision (120 ft each with RW and w/o device)  Pattern: Wide BOS (better stability with device, wt bearing on R fingers only, not on palm or wrist)  Stair Management:  (n/a)         Neuro Re-Ed  Sitting Balance: independent  Standing Balance: supervision            Signature: Leilani Cespedes, PT  02/05/2022          Room/Bed:  U981/X914-78

## 2022-02-05 NOTE — Progress Notes (Signed)
[facility] PROGRESS NOTE     Date Time:    02/05/22    8:32 AM  Patient Name: [name]    LOS: 1 day     Subjective:  Denies CP, SOB, orthopnea.  Has not yet been up out of bed.    Scheduled Meds:   apixaban, 10 mg, Oral, Q12H SCH   Followed by  Melene Muller ON 02/09/2022] apixaban, 5 mg, Oral, Q12H SCH  ARIPiprazole, 10 mg, Oral, Daily  aspirin, 81 mg, Oral, Daily  atorvastatin, 80 mg, Oral, QHS  budesonide-formoterol, 2 puff, Inhalation, BID  bumetanide, 2 mg, Oral, BID  buPROPion XL, 150 mg, Oral, Daily  carvedilol, 25 mg, Oral, Q12H SCH  cetirizine, 5 mg, Oral, QHS  diphenhydrAMINE, 12.5 mg, Intravenous, Once  docusate sodium, 100 mg, Oral, Q12H  DULoxetine, 60 mg, Oral, Daily  fluticasone, 2 spray, Each Nare, Daily  folic acid, 1 mg, Oral, Daily  gabapentin, 300 mg, Oral, BID  hydrALAZINE, 75 mg, Oral, Q12H  insulin glargine, 35 Units, Subcutaneous, Q12H  insulin lispro, 1-4 Units, Subcutaneous, QHS  insulin lispro, 1-8 Units, Subcutaneous, TID AC  insulin lispro, 10 Units, Subcutaneous, TID AC  iron sucrose, 200 mg, Intravenous, Q24H SCH  montelukast, 10 mg, Oral, QHS  pantoprazole, 40 mg, Oral, QAM AC  sodium chloride, 250 mL, Intravenous, Once  traZODone, 150 mg, Oral, QHS  vitamin D (ergocalciferol), 50,000 Units, Oral, Weekly     Continuous Infusions:   PRN Meds: acetaminophen **OR** acetaminophen, albuterol sulfate HFA, baclofen, dextrose **OR** dextrose **OR** dextrose **OR** glucagon (rDNA), hydrOXYzine, naloxone, oxyCODONE, polyethylene glycol, promethazine **OR** promethazine    Objective:  Physical Exam:  BP 115/79   Pulse 77   Temp 98.2 F (36.8 C) (Oral)   Resp 16   Ht 1.676 m (5\' 6" )   Wt 135.6 kg (299 lb)   SpO2 99%   BMI 48.26 kg/m      Intake/Output Summary (Last 24 hours) at 02/05/2022 0700  Last data filed at 02/05/2022 0100  Gross per 24 hour   Intake 240 ml   Output 1300 ml   Net -1060 ml       Wt Readings from Last 1 Encounters:   02/04/22 2220 135.6 kg (299 lb)    02/04/22 1116 135.8 kg (299 lb 6.2 oz)   02/02/22 2109 135.4 kg (298 lb 8.1 oz)     General: awake, alert, oriented x 3, no acute distress.  Cardiovascular: regular rate and rhythm, no murmurs, rubs or gallops     Lungs: clear to auscultation bilaterally, without wheezing, rhonchi, or rales  Abdomen: soft, non-tender, non-distended, obese  Extremities: Trace lower extremity edema    Lab Review:   Recent Labs     02/05/22  0744 02/04/22  0656 02/03/22  0436   WBC 11.59* 10.90* 13.35*   Hgb 8.1* 8.2* 8.4*   Hematocrit 26.4* 26.0* 26.6*   Platelets 346 322 336       Recent Labs     02/04/22  0656 02/03/22  1041 02/03/22  0112 02/02/22  2306   hs Troponin-I 13.9 19.2* 31.6* 30.9*   hs Troponin-I Delta  --   --  0.7  --    NT-proBNP  --   --   --  572*      Recent Labs     02/05/22  0744 02/04/22  0656 02/03/22  1048   Sodium 135 131* 136   Potassium 4.8 5.1 4.3   CO2 22 20  20   BUN 67.0* 61.0* 47.0*   Creatinine 3.2* 3.1* 2.4*   Glucose 106* 265* 225*   Magnesium 2.0 1.8 1.8      Estimated Creatinine Clearance: 32.5 mL/min (A) (based on SCr of 3.2 mg/dL (H)).   Recent Labs     02/03/22  0436   PT INR 1.2*   TSH 2.94          Telemetry:not on tele     Imaging:  Radiology Results (24 Hour)       ** No results found for the last 24 hours. **            Assessment/Plan:   [name] is a 41 y.o. female with a history of HFpEF (Echo 03/2021 grade 1 diastolic dysfunction, LVEF 50 to 55%), HTN, HLD, DVT/PE (2021), CKD, OSA, vertigo, anemia, and asthma who presented to the hospital on 02/02/2022 with left leg pain.  Patient had right hand surgery on 12/06/2021.  She had held her Eliquis for 1 week prior to surgery and 4 days after surgery.  It was stopped again 3 weeks ago due to poor hand healing/bleeding.  Bilateral lower extremity dopplers this admission showed nonocclusive DVT of the left lower extremity mid and distal superficial femoral vein.     Very mild volume overload, possibly from HFpEF acute on chronic  kidney disease.  Left leg DVT. Nonocclusive DVT of the left lower extremity mid and distal superficial femoral vein.  AKI on CKD.  Baseline creatinine 1.6.  Mildly elevated hs troponin.  Peaked at 31.6.  Denies chest pain. Likely related to kidney failure and demand.   History of DVT/PE (2021).  Patient was on Eliquis 5 mg twice daily with interruption for hand surgery in January 2023 and discontinuation 3 weeks ago due to nonhealing left hand.  HTN.  Not well controlled carvedilol 25 mg twice daily, hydralazine 50 mg twice daily, and olmesartan.   HLD.  On atorvastatin 80 mg.  OSA  Vertigo  Anemia.  Hgb 8.4.   Hypomagnesia.  Magnesium 1.5.  Replaced.  Hyponatremia.  Sodium  on admit 132.  Resolved.   Asthma  DM type II.  Insulin-dependent.  Hemoglobin A1c (02/03/2022) 9.4.  History of dilated ascending aorta.     From cardiac stand point, do not recommend any further diuresis.  Recommend liberalization of patient's p.o. fluid intake.  She is euvolemic on exam and has worsening renal function.  Encourage the patient to get out of bed as much as possible.  BP on lower side. Decrease Hydralazine to 50 mg BID. Hold for SBP<110  No further in patient cardiac diagnostics or intervention.  Cardiology will sign off at this time.  Please let us know if we can be of further assistance.      Patient seen and examined. History, vitals, and pertinent test results reviewed. I agree with the assessment and plan as detailed above.  I confirmed and edited the H&P, performed the physical exam, and performed the entirety of the medical decision making.        This note was generated by the Epic EMR system/ Dragon speech recognition and may contain inherent errors or omissions not intended by the user. Grammatical errors, random word insertions, deletions, pronoun errors and incomplete sentences are occasional consequences of this technology due to software limitations. Not all errors are caught or corrected. If there are questions or  concerns about the content of this note or information contained within the body of  this dictation they should be addressed directly with the author for clarification.    Signed by: Baruch Gouty, MD PhD

## 2022-02-05 NOTE — Significant Event (Signed)
Blood sugar tonight 149. Ordered for 35U BID. Will only give 20 units tonight. Will defer to am team on further adjustments.

## 2022-02-05 NOTE — Progress Notes (Signed)
[facility], [employer].  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)  Vitamin D deficiency (level < 13 in 2021)     Chronic diastolic CHF  Morbid obesity  OSA not on CPAP  Asthma - requires steroids frequently     Anemia of CKD/Fe def  Normal B12  LLE DVT  H/o PE    Nephrotic syndrome  Albumin < 2  UPCR: 4  Urine micro albumin: > 2000  Not a candidate for renal biopsy due to anticoagulation    Hyponatremia      Plan:    Creatinine: 2.8-> 2.4-> 3.1-> 3.2  Proteinuria w/up pending  Mild orthostasis ytday  Stop bumex - eventually will need as outpt  Renally dosed gabapentin  Coreg 25 mg bid   Make hydralazine 50 mg bid  IV Fe - total 1 gram  ESA once Fe replete  Vit D supplementation  Needs better DM control  Eliquis  Hold RAASi for now - eventually restart as outpt  Daily weights  Strict intake and output  Labs/supportive care  Outpt renal FU    Consider RAASi/MRA/SGLT-2i as outpt once AKI improves    If discharged she needs outpt renal FU in 2 weeks        Collier Flowers, MD  Office - [phone], or EpicChat  [facility] [place] [employer] [phone]    Medications:  Scheduled Meds:  Current Facility-Administered Medications   Medication Dose Route Frequency    apixaban  10 mg Oral Q12H SCH    Followed by    Melene Muller ON 02/09/2022] apixaban  5 mg Oral Q12H SCH    ARIPiprazole  10 mg Oral Daily    aspirin  81 mg Oral Daily    atorvastatin  80 mg Oral QHS    budesonide-formoterol  2 puff Inhalation BID    bumetanide  2 mg Oral BID    buPROPion XL  150 mg Oral Daily    carvedilol  25 mg Oral Q12H SCH    cetirizine  5 mg Oral QHS     diphenhydrAMINE  12.5 mg Intravenous Once    docusate sodium  100 mg Oral Q12H    DULoxetine  60 mg Oral Daily    fluticasone  2 spray Each Nare Daily    folic acid  1 mg Oral Daily    gabapentin  300 mg Oral BID    hydrALAZINE  75 mg Oral Q12H    insulin glargine  35 Units Subcutaneous Q12H    insulin lispro  1-4 Units Subcutaneous QHS    insulin lispro  1-8 Units Subcutaneous TID AC    insulin lispro  10 Units Subcutaneous TID AC    iron sucrose  200 mg Intravenous Q24H SCH    montelukast  10 mg Oral QHS    pantoprazole  40 mg Oral QAM AC    sodium chloride  250 mL Intravenous Once    traZODone  150 mg Oral QHS    vitamin D (ergocalciferol)  50,000 Units Oral Weekly     Continuous Infusions:  PRN Meds:acetaminophen **OR** acetaminophen, albuterol sulfate HFA, baclofen, dextrose **OR** dextrose **OR** dextrose **OR** glucagon (rDNA), hydrOXYzine, naloxone, oxyCODONE, polyethylene glycol, promethazine **OR** promethazine    Objective:  Vital signs in last 24 hours:  Temp:  [97.9 F (36.6 C)-98.4 F (36.9 C)] 97.9 F (36.6 C)  Heart Rate:  [65-86] 81  Resp Rate:  [16-18] 16  BP: (87-143)/(55-87) 136/84  Intake/Output last 24 hours:    Intake/Output Summary (Last 24 hours) at 02/05/2022 1517  Last data filed at 02/05/2022 0100  Gross per 24 hour   Intake 240 ml   Output 400 ml   Net -160 ml       Intake/Output this shift:  No intake/output data recorded.    Physical Exam:  Physical Exam    Gen: No acute distress  Eyes: Pallor, anicteric  CVS: S1 S2 present, no rub  RS: Dec AE @ bases  Abd: Soft, non-tender  Skin: Warm and dry  Psych: Flat affect  CNS: Grossly non focal    Labs:  Recent Labs   Lab 02/05/22  0744 02/04/22  0656 02/03/22  1048 02/03/22  0436   Glucose 106* 265* 225* 427*   BUN 67.0* 61.0* 47.0* 46.0*   Creatinine 3.2* 3.1* 2.4* 2.5*   Calcium 7.8* 7.9* 8.1* 7.8*   Sodium 135 131* 136 132*   Potassium 4.8 5.1 4.3 4.5   Chloride 106 104 107 105   CO2 22 20 20 18    Albumin 1.9* 1.9*  --  1.9*   Phosphorus  5.9* 5.7*  --  4.2   Magnesium 2.0 1.8 1.8 1.5*       Recent Labs   Lab 02/05/22  0744 02/04/22  0656 02/03/22  0436   WBC 11.59* 10.90* 13.35*   Hgb 8.1* 8.2* 8.4*   Hematocrit 26.4* 26.0* 26.6*   MCV 84.6 84.4 84.4   MCH 26.0 26.6 26.7   MCHC 30.7* 31.5 31.6   RDW 13 12 12    MPV 10.2 10.2 10.9   Platelets 346 322 336

## 2022-02-05 NOTE — Plan of Care (Signed)
Pt doing well. No new event overnight. VSS. Afebrile. PRN pain medication is giving pt some relief.    Problem: Moderate/High Fall Risk Score >5  Goal: Patient will remain free of falls  Outcome: Progressing  Flowsheets (Taken 02/05/2022 0154)  High (Greater than 13):   MOD-Remain with patient during toileting   MOD-Use of assistive devices -Bedside Commode if appropriate     Problem: Venous Thrombotic Event  Goal: Knowledge deficit/VTE Prevention  Outcome: Progressing  Goal: Free from evidence of bleeding  Outcome: Progressing  Flowsheets (Taken 02/05/2022 0154)  Free from evidence of bleeding:   Administer VTE prophylaxis as prescribed   Administer Anticoagulants as prescribed   Initiate anticoagulation precautions     Problem: Pain interferes with ability to perform ADL  Goal: Pain at adequate level as identified by patient  Outcome: Progressing  Flowsheets (Taken 02/05/2022 0154)  Pain at adequate level as identified by patient:   Identify patient comfort function goal   Assess for risk of opioid induced respiratory depression, including snoring/sleep apnea. Alert healthcare team of risk factors identified.     Problem: Side Effects from Pain Analgesia  Goal: Patient will experience minimal side effects of analgesic therapy  Outcome: Progressing  Flowsheets (Taken 02/05/2022 0154)  Patient will experience minimal side effects of analgesic therapy:   Monitor/assess patient's respiratory status (RR depth, effort, breath sounds)   Assess for changes in cognitive function

## 2022-02-06 LAB — CBC
Absolute NRBC: 0 10*3/uL (ref 0.00–0.00)
Hematocrit: 26.2 % — ABNORMAL LOW (ref 34.7–43.7)
Hgb: 8.1 g/dL — ABNORMAL LOW (ref 11.4–14.8)
MCH: 26.6 pg (ref 25.1–33.5)
MCHC: 30.9 g/dL — ABNORMAL LOW (ref 31.5–35.8)
MCV: 86.2 fL (ref 78.0–96.0)
MPV: 10.6 fL (ref 8.9–12.5)
Nucleated RBC: 0 /100 WBC (ref 0.0–0.0)
Platelets: 361 10*3/uL — ABNORMAL HIGH (ref 142–346)
RBC: 3.04 10*6/uL — ABNORMAL LOW (ref 3.90–5.10)
RDW: 13 % (ref 11–15)
WBC: 13.04 10*3/uL — ABNORMAL HIGH (ref 3.10–9.50)

## 2022-02-06 LAB — RENAL FUNCTION PANEL
Albumin: 1.8 g/dL — ABNORMAL LOW (ref 3.5–5.0)
Anion Gap: 10 (ref 5.0–15.0)
BUN: 73 mg/dL — ABNORMAL HIGH (ref 7.0–21.0)
CO2: 19 mEq/L (ref 17–29)
Calcium: 7.8 mg/dL — ABNORMAL LOW (ref 8.5–10.5)
Chloride: 107 mEq/L (ref 99–111)
Creatinine: 3.2 mg/dL — ABNORMAL HIGH (ref 0.4–1.0)
Glucose: 101 mg/dL — ABNORMAL HIGH (ref 70–100)
Phosphorus: 5.3 mg/dL — ABNORMAL HIGH (ref 2.3–4.7)
Potassium: 4.6 mEq/L (ref 3.5–5.3)
Sodium: 136 mEq/L (ref 135–145)

## 2022-02-06 LAB — PROTEIN ELECTROPHORESIS, SERUM
Albumin %: 40.1 % — ABNORMAL LOW (ref 46.6–62.6)
Albumin: 1.9 g/dL — ABNORMAL LOW (ref 3.4–4.8)
Alpha-1 Glob %: 3.8 % (ref 1.7–4.1)
Alpha-1 Globulin: 0.2 g/dL (ref 0.1–0.4)
Alpha-2 Glob %: 19.1 % — ABNORMAL HIGH (ref 8.9–14.9)
Alpha-2 Globulin: 0.9 g/dL (ref 0.8–1.2)
Beta Glob %: 21.7 % — ABNORMAL HIGH (ref 10.9–18.9)
Beta Globulin: 1 g/dL (ref 0.6–1.2)
Gamma Globulin %: 15.3 % (ref 9.8–24.4)
Gamma Globulin: 0.7 g/dL (ref 0.6–1.7)
Protein, Total: 4.7 g/dL — ABNORMAL LOW (ref 6.0–8.3)

## 2022-02-06 LAB — GLUCOSE WHOLE BLOOD - POCT
Whole Blood Glucose POCT: 122 mg/dL — ABNORMAL HIGH (ref 70–100)
Whole Blood Glucose POCT: 69 mg/dL — ABNORMAL LOW (ref 70–100)
Whole Blood Glucose POCT: 69 mg/dL — ABNORMAL LOW (ref 70–100)
Whole Blood Glucose POCT: 116 mg/dL — ABNORMAL HIGH (ref 70–100)
Whole Blood Glucose POCT: 69 mg/dL — ABNORMAL LOW (ref 70–100)
Whole Blood Glucose POCT: 78 mg/dL (ref 70–100)
Whole Blood Glucose POCT: 201 mg/dL — ABNORMAL HIGH (ref 70–100)
Whole Blood Glucose POCT: 205 mg/dL — ABNORMAL HIGH (ref 70–100)

## 2022-02-06 LAB — IMMUNOFIXATION ELECTROPHORESIS

## 2022-02-06 LAB — SERUM PROTEIN ELECTROPHORESIS REVIEW

## 2022-02-06 LAB — IFE REVIEW, SERUM

## 2022-02-06 LAB — FREE KAPPA & LAMBDA LIGHT CHAINS PLUS RATIO, QUANTITATIVE, SERUM
Kappa Free Light Chain: 10.8 mg/dL — ABNORMAL HIGH (ref 0.3300–1.94)
Kappa Lambda FLC Ratio: 1.33 (ref 0.2600–1.65)
Lambda Free Light Chain: 8.11 mg/dL — ABNORMAL HIGH (ref 0.5700–2.63)

## 2022-02-06 LAB — GFR: EGFR: 15.8

## 2022-02-06 MED ORDER — INSULIN LISPRO 100 UNIT/ML SOLN (WRAP)
5.0000 [IU] | Freq: Three times a day (TID) | Status: DC
Start: 2022-02-06 — End: 2022-02-06
  Administered 2022-02-06: 5 [IU] via SUBCUTANEOUS
  Filled 2022-02-06: qty 15

## 2022-02-06 MED ORDER — INSULIN GLARGINE 100 UNIT/ML SC SOLN
12.0000 [IU] | Freq: Two times a day (BID) | SUBCUTANEOUS | Status: DC
Start: 2022-02-06 — End: 2022-02-06

## 2022-02-06 NOTE — Progress Notes (Signed)
Patient's glucose level was monitored throughout the whole nursing shift. The therapeutic effects from the medication and interactions with the insulin was evaluated by the healthcare team.    Patient signed an AMA form this afternoon. Hospitalist was informed. No new orders, no AVS and No new prescriptions. Peripheral IV removed. Patient has left the facility with the assistance of a transporter provided by the facility.

## 2022-02-06 NOTE — Progress Notes (Signed)
Bariatric Front Wheel Walker Order  Ht: 1.676 m (5\' 6" )   Wt: 136.1 kg (300 lb)          [facility]  [facility]          Patient Name: [name], [name]     MRN: [id number]      CSN: [id number]         Account Information     Hosp Acct #   [id number] Patient Class   Inpatient Service  Medicine Accommodation Code  Semi-Private      Admission Information     Admitting Physician:  Attending Physician: Betsey Holiday, MD    Unit  [facility] L&D Status      Admitting Diagnosis: AKI (acute kidney injury) Room / Bed  W295/A213-08 L&D - Last Menstrual Cycle      Chief Complaint: Otalgia; Back Pain; Knee*     Admit Type:  Admit Date/Time:  Discharge Date/Time: Emergency  02/02/2022 / 2127  02/06/2022 / 1405 Length of Stay: 2 Days    L&D EDD   Estimated Date of Delivery: None noted.      Patient Information              Home Address: [address]  [address]  [place] [address] Employer:  Employer Address: [employer]     ,     Main Phone: [phone] Employer Phone:     SSN: [id number]       DOB: Dec 25, 1980 (40 yrs)       Sex: Female Primary Care Physician: Louann Sjogren, NP   Marital Status: Single Referring Physician:       No ref. provider found   Race: Another Race       Ethnicity: Not of Hispanic/Latino/S*       Emergency Contacts  Name Home Phone Work Phone Mobile Phone Relationship Burgess Estelle [phone]     Daughter     Vanita Ingles *     [phone] Father           Guarantor Information     Guarantor Name: AYO, GUARINO Guarantor ID: [id number]   Guarantor Relationship to Pt: Self Guarantor Type: Personal/Family   Guarantor DOB:    Mar 25, 1981       Guarantor Address: [address]  [place], [place] [address]          Guarantor Home Phone: [phone] Guarantor Employer:     [employer]   Guarantor Work Phone:   Pensions consultant Emp Phone:                     Chief Strategy Officer Name: OUT OF Conservation officer, nature OUT OF STATE [place]  Subscriber Name: [facility]   Insurance Address:    [address] [address]  [place], [place] [address] Subscriber DOB: May 19, 1981      Subscriber ID: [id number]   Insurance Phone:   Pt Relationship to Sub:   Self   Insurance ID:         Group Name:   Preauthorization #: [id number]   Group #: [id number] Preauthorization Days:        Secondary Insurance     Insurance Name: [facility] [employer]AN MEDIC* Subscriber Name: [facility]   Insurance Address:    [address]  [place] [address] Subscriber DOB: 01/07/1981      Subscriber ID: [id number]   Insurance Phone: [phone] Pt  Relationship to Sub:   Self   Insurance ID:         Group Name:   Preauthorization #:     Group #: [id number] Preauthorization Days:        [employer] Name: - Subscriber Name:     Community education officer Address:       ,   Statistician DOB:        Subscriber ID:     Press photographer:   Pt Relationship to Sub:       Insurance ID:         Group Name:   Preauthorization #:     Group #:   Preauthorization Days:           02/06/2022 4:32 PM      Home Health face-to-face (FTF) Encounter (Order [id number])  Consult  Date: 02/06/2022 Department: [facility] Ordering/Authorizing: Betsey Holiday, MD     Order Information    Order Date/Time Release Date/Time Start Date/Time End Date/Time   02/06/22 01:24 PM None 02/06/22 01:23 PM 02/06/22 01:23 PM     Order Details    Frequency Duration Priority Order Class   Once 1  occurrence Routine Hospital Performed     Standing Order Information    Remaining Occurrences Interval Last Released     0/1 Once 02/06/2022              Provider Information    Ordering User Ordering Provider Authorizing Provider   Rollen Sox, RN Betsey Holiday, MD Betsey Holiday, MD   Attending Provider(s) Admitting Provider PCP   Donalynn Furlong, MD; Ron Parker, MD; Betsey Holiday, MD Betsey Holiday, MD Louann Sjogren, NP     Verbal Order Info    Action Created on Order Mode Entered by  Responsible Provider Signed by Signed on   Ordering 02/06/22 1324 Telephone with Kizzie Furnish, RN Betsey Holiday, MD Betsey Holiday, MD 02/06/22 1827           Comments    Bariatric Rolling walker needed >99 months,   NPI: [id number]       Abnormality of Gait R26.9                Home Health face-to-face (FTF) Encounter: Patient Communication     Not Released  Not seen         Order Questions    Question Answer   Date I saw the patient face-to-face: 02/06/2022   Evidence this patient is homebound because: O.  N/A DME only   Medical conditions that necessitate Home Health care: M.  N/A DME only. No skilled services needed   Per clinical findings, following services are medically necessary: DME   DME Bariatric Walker                    Process Instructions    Please select Home Care Services medically necessary.     Based on the above findings, I certify that this patient is confined to the home and needs intermittent skilled nursing care, physical therapry and / or speech therapy or continues to need occupational therapy. The patient is under my care, and I have initiated the establishment of the plan of care. This patient will be followed by a physician who will periodically review the plan of care.      Collection Information  Consult Order Info    ID Description Priority Start Date Start Time   [id number] Home Health face-to-face (FTF) Encounter Routine 02/06/2022  1:23 PM   Provider Specialty Referred to   ______________________________________ _____________________________________         Acknowledgement Info    For At Acknowledged By Acknowledged On   Placing Order 02/06/22 1324 Lynn Layman, RN 02/06/22 1344                     Verbal Order Info    Action Created on Order Mode Entered by Responsible Provider Signed by Signed on   Ordering 02/06/22 1324 Telephone with Kizzie Furnish, RN Betsey Holiday, MD Betsey Holiday, MD 02/06/22 1827           Patient Information    Patient Name    [name] Legal Sex   Female DOB   1981-06-12       Reprint Order Requisition    Home Health face-to-face (FTF) Encounter (Order #[id number]) on 02/06/22       Additional Information    Associated Reports External References   Priority and Order Details [facility]      Moharir, Corky Mull, PT   Physical Therapist  Physical Medicine and Rehabilitation  PT Progress Note     Signed  Date of Service:  02/06/2022 10:32 AM  Creation Time:  02/06/2022 10:42 AM     Signed                                                                                                                                                                                                                                                                              Physical Therapy Treatment   [name]    Unit: [facility]                       Bed: Q657/Q469-62      Post Acute Care Therapy Recommendations:   Discharge Recommendations:  Home with supervision, Home with outpatient PT  D/C Milestones: Patient is safe to return home with family assistance today.  DME needs IF patient is discharging home: Front wheel walker  Therapy discharge recommendations may change with patient status.  Please refer to most recent note for up-to-date recommendations.     Recommended (non-medical) mode of transportation at discharge:   car     PMP - Progressive Mobility Protocol   PMP Activity: Step 7 - Walks out of Room  Distance Walked (ft) (Step 6,7): 100 Feet     Assessment:      Daughter present and observed therapy session. Pt does better with use of rolling walker for gait with less guarding and fear of falling. Pt fatigues very quickly for age, encouraged frequent short walks to build up endurance. Pt will benefit from out-pt PT for strength and conditioning upon d/c.         Prognosis: Good;With continued PT status post acute discharge     Interdisciplinary Communication:   Patient up in bed with alarm activated; call bell within reach.  Updated white communication board in room with patient's current mobility status .  Communicated with RN, via direct contact regarding patient's mobility status.     Education:   Further education provided to patient & dtr on safety with mobility and ADLs, , falls prevention.  Demonstrated  understanding with verbal cues, demonstration and practice of skills.  Will benefit from further training to focus on bed mobility, out of bed activity, transfers, gait and exs.       Plan:   Goals  Pt Will Transfer Bed/Chair: independent;Not met  Pt Will Ambulate: > 200 feet;with rolling walker;modified independent;Not met        Plan  Risks/Benefits/POC Discussed with Pt/Family: With patient  Patient Goal: home today  Treatment/Interventions: Exercise;Gait training;Stair training;Neuromuscular re-education;LE strengthening/ROM  PT Frequency: 3-4x/wk     Continue plan of care.     ------------------------------------------------------------------------------------------------------------------     Time of treatment:   Start Time: 1010 Stop Time: 1030  Time Calculation (min): 20 min  PT Received On: 02/06/22        Treatment # 4        Precautions  Other Precautions: falls, R hand surgery                                   Patient's medical condition is appropriate for Physical Therapy intervention at this time.      Subjective: "I hope I can leave today!"  Nursing clears patient for therapy.  Patient Goal: home today   Pain Assessment  Pain Assessment: No/denies pain           Objective:  Patient is in bed with no medical equipment in place.     Blood pressure 127/83, pulse 83, temperature 98.2 F (36.8 C), temperature source Oral, resp. rate 16, height 1.676 m (5\' 6" ), weight 135.6 kg (299 lb), SpO2 99 %.           Lab Results   Component Value Date     WBC 13.04 (H) 02/06/2022     HGB 8.1 (L) 02/06/2022     HCT 26.2 (L) 02/06/2022     MCV 86.2 02/06/2022     PLT 361 (H) 02/06/2022         Cognition/Neuro Status  Orientation  Level: Oriented X4  Following Commands: Follows all commands and directions without difficulty  Motor Planning: bradykinesia     Functional Mobility  Rolling: Independent  Supine to Sit: Independent  Scooting to HOB: Independent  Scooting to EOB: Independent  Sit  to Supine: Independent  Sit to Stand: Independent  Stand to Sit: Independent     Transfers  Stand Pivot Transfers: Supervision (with or without RW)     Locomotion  Ambulation: Supervision (100 ft x 4 with RW -- limited by fatigue and R distal hand  placed lightly on R walker handle (recent R hand surgery))  Pattern: decreased step length;decreased cadence (increased time and effort)  Stair Management:  (n/a)        Neuro Re-Ed  Sitting Balance: elbow propping;sitting with perturbations all planes;sitting reaching activities;without support;independent  Standing Balance: standing reaching activities;standing weight shifting all planes;standing with perturbations all planes;supervision (with RW)              Signature: Charuta Moharir, PT  02/06/2022             Room/Bed:  Z308/M578-46

## 2022-02-06 NOTE — Discharge Summary (Signed)
[facility]      Patient: [name]  Admission Date: 02/02/2022   DOB: [birth year]  Discharge Date: 02/06/2022    MRN: [id number]  Discharge Attending: Ronney Lion, MD   Referring Physician: Louann Sjogren, NP  PCP: Louann Sjogren, NP       DISCHARGE SUMMARY     Discharge Information   Admission Diagnosis:   LLE DVT  Acute on chronic HFpEF  AKI on CKD III  Poorly controlled T2DM, suspect noncompliance    Discharge Diagnosis:   Patient was not medically cleared for discharge. Patient left AMA.    Admission Condition: fair  Discharge Condition: Left AMA  Discharge Disposition: Left AMA  Functional Status: Patient is independent with mobility/ambulation, transfers, ADL's, IADL's.    Discharge Medications:     Medication List        ASK your doctor about these medications      albuterol sulfate HFA 108 (90 Base) MCG/ACT inhaler  Commonly known as: PROVENTIL  Inhale 2 puffs into the lungs every 4 (four) hours as needed for Wheezing     Alcohol Swabs Pads  Use as indicated for insulin injections     apixaban 5 MG  Commonly known as: ELIQUIS     ARIPiprazole 5 MG tablet  Commonly known as: ABILIFY     aspirin 81 MG chewable tablet     atorvastatin 40 MG tablet  Commonly known as: LIPITOR     Azelastine-Fluticasone 137-50 MCG/ACT Susp     baclofen 10 MG tablet  Commonly known as: LIORESAL     bumetanide 2 MG tablet  Commonly known as: BUMEX  Take 1 tablet (2 mg total) by mouth 2 (two) times daily     buPROPion XL 150 MG 24 hr tablet  Commonly known as: WELLBUTRIN XL     carvedilol 25 MG tablet  Commonly known as: COREG     cetirizine 5 MG tablet  Commonly known as: ZyrTEC     diclofenac 50 MG EC tablet  Commonly known as: VOLTAREN     docusate sodium 100 MG capsule  Commonly known as: COLACE     Dulera 200-5 MCG/ACT Aero  Generic drug: Mometasone Furo-Formoterol Fum     DULoxetine 60 MG capsule  Commonly known as: CYMBALTA     EPINEPHrine 0.15 MG/0.3ML injection     folic acid 1 MG  tablet  Commonly known as: FOLVITE     gabapentin 800 MG tablet  Commonly known as: NEURONTIN     hydrALAZINE 50 MG tablet  Commonly known as: APRESOLINE     hydrOXYzine 10 MG tablet  Commonly known as: ATARAX     insulin lispro 100 UNIT/ML injection  Commonly known as: HumaLOG  Inject 25 Units into the skin 3 (three) times daily before meals     Insulin Syringe-Needle U-100 27G X 1/2" 1 ML Misc  Inject 1 Dose into the skin 4 (four) times daily     Levemir 100 UNIT/ML injection  Generic drug: insulin detemir  Inject 50 Units into the skin every 12 (twelve) hours     lidocaine 5 %  Commonly known as: LIDODERM  Place 1 patch onto the skin every 24 hours Remove & Discard patch within 12 hours or as directed by MD     meclizine 25 MG tablet  Commonly known as: ANTIVERT     metOLazone 10 MG tablet  Commonly known as: ZAROXOLYN     montelukast 10 MG tablet  Commonly known as: SINGULAIR     olmesartan 40 MG tablet  Commonly known as: BENICAR     omeprazole 40 MG capsule  Commonly known as: PriLOSEC     Ozempic (0.25 or 0.5 MG/DOSE) 2 MG/1.5ML Sopn  Generic drug: Semaglutide(0.25 or 0.5MG /DOS)     topiramate 100 MG tablet  Commonly known as: TOPAMAX     traZODone 150 MG tablet  Commonly known as: DESYREL     Trulicity 1.5 MG/0.5ML Sopn  Generic drug: Dulaglutide     valACYclovir 1000 MG tablet  Commonly known as: VALTREX               Patient Lines/Drains/Airways Status       Active PICC Line / CVC Line / PIV Line / Drain / Airway / Intraosseous Line / Epidural Line / ART Line / Line / Wound / Pressure Ulcer / NG/OG Tube       Name Placement date Placement time Site Days    Wound 12/05/21 Surgical Incision Arm Right 12/05/21  0837  Arm  63                       Hospital Course   Presentation History   [name] is a 41 y.o. 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 She had posterior HA radiating into her neck, with achiness in her upper and mid back and shoulders. She had some left ear pain, "bronchitis type coughing," intermittent sweats like she might have fever, higher than usual blood pressures and blood sugars with feeling more tired. She also reports chronic nausea, but denies emesis, diarrhea, chest pain, SOB, abdominal pain, urinary symptoms. She became most concerned, however, when she developed worsening posterior left knee and thigh pain that reminded her of when she had DVT and PE in 2020, for which she had chronically been on Eliquis. She states she resumed Eliquis as advised post-operatively, but then stopped Eliquis 3 weeks ago after the incision on her right hand from her carpal tunnel release surgery 12/06/21 would not close, kept bleeding, and got infected. She was placed on Keflex and the incision healed, but she did not know when to resume Eliquis. She states this is only the second instance of DVT so it is unclear why she remained on Eliquis indefinitely after initial diagnosis of non-occlusive RLL PE October 2020.    See HPI for details.    Hospital Course (2 Days)     Patient was not medically cleared for discharge. Patient left [facility] 02/06/22.    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 Rayburn Felt  - Hypercoagulable workup as per Heme as outpt  - Will need repeat doppler in 6 weeks to monitor clot, outpt follow-up with Heme  - Oxycodone 5mg  q6h prn pain  - Leukocytosis likely reactive 2/to DVT as no signs of infection     Acute on chronic  HFpEF  Mildly elevated troponin likely demand ischemia  Suspected  cardiorenal syndrome  Uncontrolled HTN  - ECHO noted, EF 65%  - Received IV Lasix while inpt, diuresis as per Nephro and Cardio recs  - Appreciate Cardiology consult, Dr. Ginger Organ  - Strict I/Os, standing weights, replete electrolytes with goal K>4 and Mg>2  - Continue Carvedilol 25 mg twice daily  - Continue to hold Hydralazine and Olmesartan due to AKI  - Monitor VS. Monitor labs.      AKI superimposed on chronic CKD III  Sodium corrected with hyperglycemia, in range  Corrected calcium with hypoalbuminemia, in range  - Cr 3.2 today, worse  - Appreciate Nephrology consult, Dr. Charlann Boxer  - Trend renal function and lytes  - Renally dose medications, Gabapentin dose reduced for renal fx     Poorly controlled T2DM, on long-term insulin, suspect noncompliance  - Continue Lantus at reduced dose 12u BID - dose further adjusted and reduced 3/30  - Continue preprandial Lispro at reduced dose 5u TID with meals - dose further adjusted and reduced 3/30  - Check BG before meals, at bedtime  - Add PSSI coverage or uptitrate as warranted  - A1c 9.4 (improved from 14 in 2021)     Neuropathy  Fibromyalgia  OA  - Continue Gabapentin, Cymbalta  - Baclofen prn muscle spasms     Anemia of chronic disease  - Hb stable around b/l 9  - Continue folic acid  - Iron profile, vitamin B12 noted  - Requires Aranesp monthly, occasional Venofer infusions per patient     Asthma, unspecified type  Seasonal and environmental allergies  Left otitis media with effusion  - Continue Dulera (sub Symbicort) scheduled, Albuterol prn  - Continue Cetirizine, Montelukast, Flonase     Depression/anxiety  - Continue Abilify, Bupropion  - Continue Trazodone for sleep     OSA not on CPAP, noted     Obesity Class 3, BMI>48  - Lifestyle modifications recommended    D/w patient, RN, Attending  As mentioned, patient was not medically cleared for discharge home. Educated and discussed with patient  medical reasoning for remaining in the hospital, however I was informed by RN later in day she left AMA.    Procedures/Imaging:   Echocardiogram Adult Complete W Color Doppler Waveform   Final Result      XR Chest  AP Portable   Final Result    Negative for acute process             Gerlene Burdock, MD   02/02/2022 11:15 PM      [place] VenoDopp Low Extremity Left   Final Result          1. Nonocclusive deep venous thrombosis of the LEFT lower extremity mid   and distal superficial femoral vein.      Critical results were discussed with Dr. Lorene Dy by Dr. Jackey Loge at 11:20   PM on 02/02/2022.      Judd Gaudier, MD   02/02/2022 11:30 PM          Treatment Team:   Consulting Physician: Craig Staggers, MD               Progress Note/Physical Exam at Discharge     Subjective: Seen lying in bed comfortably. C/o left leg edema. C/o lower back pain and "cloudy urine." Issues with hypoglycemia overnight again with use of Insulin, suspect pt is non-compliant with Insulin at home, she tells me she takes "up to 53 units of Levemir" at home twice daily, however here she is becoming hypoglycemic  with use of 20 units daily. Insulin was further decreased today and patient was educated that she needs to remain in the hospital tonight, she verbalized understanding at the time of my visit, however later in day she left AMA.    Vitals:    02/05/22 2325 02/06/22 0319 02/06/22 0808 02/06/22 1154   BP: 114/76 92/57 127/83 137/85   Pulse: 86 80 83 77   Resp: 18 18 16 18    Temp: 98.6 F (37 C) 98.4 F (36.9 C) 98.2 F (36.8 C) 98.2 F (36.8 C)   TempSrc: Oral Oral Oral Oral   SpO2: 95% 97% 99% 97%   Weight:       Height:           GEN APPEARANCE: In NAD  HEENT: Pupils equal; Conjunctiva clear and Sclerae anicteric; MMM  NECK: Supple  CVS: RRR, S1, S2; No M/G/R  LUNGS: CTAB; No Wheezes; No Rhonchi; No Rales  ABD: Obese; Soft; ND; No TTP  EXT: B/L lower back pain, B/L paraspinals muscles TTP (subjective) upon palpation; Left LE pedal edema+, no calf  TTP  SKIN: No rash   NEURO: No acute focal neurological deficits        Diagnostics     Labs/Studies Pending: Yes - Left AMA    Last Labs   Recent Labs   Lab 02/06/22  0614 02/05/22  0744 02/04/22  0656   WBC 13.04* 11.59* 10.90*   RBC 3.04* 3.12* 3.08*   Hgb 8.1* 8.1* 8.2*   Hematocrit 26.2* 26.4* 26.0*   MCV 86.2 84.6 84.4   Platelets 361* 346 322       Recent Labs   Lab 02/06/22  0614 02/05/22  0744 02/04/22  0656 02/03/22  1048 02/03/22  0436   Sodium 136 135 131* 136 132*   Potassium 4.6 4.8 5.1 4.3 4.5   Chloride 107 106 104 107 105   CO2 19 22 20 20 18    BUN 73.0* 67.0* 61.0* 47.0* 46.0*   Creatinine 3.2* 3.2* 3.1* 2.4* 2.5*   Glucose 101* 106* 265* 225* 427*   Calcium 7.8* 7.8* 7.9* 8.1* 7.8*   Magnesium  --  2.0 1.8 1.8 1.5*       Microbiology Results (last 15 days)       Procedure Component Value Units Date/Time    COVID-19 (SARS-CoV-2) only (Liat Rapid) asymptomatic admission [[id number]] Collected: 02/03/22 0027    Order Status: Completed Specimen: Nasopharyngeal Updated: 02/03/22 0136     Purpose of COVID testing Screening     SARS-CoV-2 Specimen Source Nasal Swab     SARS CoV 2 Overall Result Not Detected     Comment: __________________________________________________  -A result of "Detected" indicates POSITIVE for the    presence of SARS CoV-2 RNA  -A result of "Not Detected" indicates NEGATIVE for the    presence of SARS CoV-2 RNA  __________________________________________________________  Test performed using the Roche cobas Liat SARS-CoV-2 assay. This assay is  only for use under the Food and Drug Administration's Emergency Use  Authorization. This is a real-time RT-PCR assay for the qualitative  detection of SARS-CoV-2 RNA. Viral nucleic acids may persist in vivo,  independent of viability. Detection of viral nucleic acid does not imply the  presence of infectious virus, or that virus nucleic acid is the cause of  clinical symptoms. Negative results do not preclude SARS-CoV-2 infection and  should  not be used as the sole basis for diagnosis, treatment or other  patient management  decisions. Negative results must be combined with  clinical observations, patient history, and/or epidemiological information.  Invalid results may be due to inhibiting substances in the specimen and  recollection should occur. Please see Fact Sheets for patients and providers  located:  WirelessDSLBlog.no         Narrative:      o Collect and clearly label specimen type:  o PREFERRED-Upper respiratory specimen: One Nasal Swab in  [employer].  o Hand deliver to laboratory ASAP  Indication for testing->Extended care facility admission to  semi private room  Screening             Patient Instructions   Discharge Diet: Left AMA  Discharge Activity: Left AMA    Follow Up Appointment: Left AMA but below listed are the consultants that saw her while she was inpatient   Follow-up Information       Todea, Leida Lauth, NP Follow up.    Specialty: Nurse Practitioner  Contact information:  [address] [place] [address]  [phone]               Robert Bellow, MD Follow up.    Specialties: Medical Oncology, Hematology, Hospice and Palliative Medicine, Internal Medicine  Contact information:  [address]  [place] [address]  [phone]               Collier Flowers, MD Follow up.    Specialties: Nephrology, Internal Medicine  Contact information:  [address]  [place] [address]  [phone]               Craig Staggers, MD Follow up.    Specialty: Cardiology  Contact information:  [address]  [address] [address]  [phone]                              Time spent examining patient, discussing with patient/family regarding hospital course, chart review: 45 minutes.    Desma Mcgregor, PA-C  4:31 PM 02/06/2022

## 2022-02-06 NOTE — Discharge Instr - AVS First Page (Addendum)
Reason for your Hospital Admission:  ***      Instructions for after your discharge:  ***         Home Health Discharge Information        [employer]:  Name of DME Agency: [employer] [facility] [phone])]  Equipment Ordered: Rolling walker will be delivered to patient home

## 2022-02-06 NOTE — Progress Notes (Addendum)
Home Health Referral      Referral from Nicholos Johns (Case Manager) for home health care upon discharge.    By [employer], the patient has the right to freely choose a home care provider.    Arrangements have been made with:    A company of the patients choosing. We have supplied the patient with a listing of providers in your area who asked to be included and participate in Medicare.   [facility], formerly [facility], a home care agency that provides adult home care services and participates in Medicare   The preferred provider of your insurance company. Choosing a home care provider other than your insurance company's preferred provider may affect your insurance coverage.      Home Health Discharge Information        [employer]:  Name of DME Agency: [employer] [facility] [phone])]  Equipment Ordered: Rolling walker will be delivered to patient home              Signed by: Rollen Sox, RN  Date Time: 02/06/22 1:25 PM

## 2022-02-06 NOTE — Plan of Care (Addendum)
A/O X 4, RA.  Bed time BS 149-held 35 units of lantus and given 20 units only per the order. Consistent carb diet. Call light and tray table within reach, bed at the lowest with bed alarm on. Continue to monitor.   Problem: Moderate/High Fall Risk Score >5  Goal: Patient will remain free of falls  Outcome: Progressing  Flowsheets (Taken 02/06/2022 0148)  Moderate Risk (6-13):   LOW-Fall Interventions Appropriate for Low Fall Risk   MOD-Perform dangle, stand, walk (DSW) prior to mobilization     Problem: Venous Thrombotic Event  Goal: Knowledge deficit/VTE Prevention  Outcome: Progressing  Flowsheets (Taken 02/06/2022 0148)  Knowledge deficit/ VTE Prevention: Review medications: to include dose, frequency, drug interactions and side effects  Goal: Free from evidence of bleeding  Outcome: Progressing  Flowsheets (Taken 02/06/2022 0148)  Free from evidence of bleeding:   Administer VTE prophylaxis as prescribed   Administer Anticoagulants as prescribed   Assess patient's risk for falls and implement fall prevention plan of care per policy   Monitor for signs and symptoms of bleeding   Monitor for adverse reactions to anticoagulant     Problem: Pain interferes with ability to perform ADL  Goal: Pain at adequate level as identified by patient  Outcome: Progressing  Flowsheets (Taken 02/06/2022 0148)  Pain at adequate level as identified by patient: Identify patient comfort function goal

## 2022-02-06 NOTE — Progress Notes (Signed)
PROGRESS NOTE    Date Time: 02/06/22 1:18 PM  Patient Name: [name], [name]      Assessment:   1.Left Lower Ext DVT   2.HFrEF: In ASA , Bumex   3.h/o PE/DVT  4.CKD3  5. poorly controlled T2DM   6. diabetic retinopathy o  7.DM ON  insulin  8.uncontrolled HTN: ON Coreg   9.HLD: on Lipitor   10.OSA not on CPAP  11.fibromyalgia  12. OA  13.asthma: ON Symbicort   14anemia  15.depression/anxiety: ON Wellbutrin, Cymbalata   16. obesity class 3 (BMI>48  17. 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   Will cont to monitor the pt for treatment related toxicity   Cont plan of care   Case was DW pt , RN   Subjective:   Pt was seen, pain is better, no CP or SOB   Medications:     Current Facility-Administered Medications   Medication Dose Route Frequency    apixaban  10 mg Oral Q12H SCH    Followed by    Melene Muller ON 02/09/2022] apixaban  5 mg Oral Q12H SCH    ARIPiprazole  10 mg Oral Daily    aspirin  81 mg Oral Daily    atorvastatin  80 mg Oral QHS    budesonide-formoterol  2 puff Inhalation BID    buPROPion XL  150 mg Oral Daily    carvedilol  25 mg Oral Q12H SCH    cetirizine  5 mg Oral QHS    diphenhydrAMINE  12.5 mg Intravenous Once    docusate sodium  100 mg Oral Q12H    DULoxetine  60 mg Oral Daily    fluticasone  2 spray Each Nare Daily    folic acid  1 mg Oral Daily    gabapentin  300 mg Oral BID    hydrALAZINE  50 mg Oral Q12H    insulin glargine  12 Units Subcutaneous Q12H    insulin lispro  1-4 Units Subcutaneous QHS    insulin lispro  1-8 Units Subcutaneous TID AC    insulin lispro  5 Units Subcutaneous TID AC    iron sucrose  200 mg Intravenous Q24H SCH    montelukast  10 mg Oral QHS    pantoprazole  40 mg Oral QAM AC    sodium chloride  250 mL Intravenous Once    traZODone  150 mg Oral QHS    vitamin D (ergocalciferol)  50,000  Units Oral Weekly       Review of Systems:   Remainder of 10 point ROS as above or otherwise negative    Physical Exam:     Vitals:    02/06/22 1154   BP: 137/85   Pulse: 77   Resp: 18   Temp: 98.2 F (36.8 C)   SpO2: 97%       Temperature: Temp  Min: 98.2 F (36.8 C)  Max: 98.6 F (37 C)  Pulse: Pulse  Min: 77  Max: 87  Respiratory: Resp  Min: 16  Max: 18  Non-Invasive BP: BP  Min: 92/57  Max: 144/82  Pulse Oximetry SpO2  Min: 95 %  Max: 99 %    Intake and Output Summary (Last 24 hours) at Date Time  Intake/Output Summary (Last 24 hours) at 02/06/2022 1318  Last data filed at 02/06/2022 0625  Gross per 24 hour   Intake --   Output 1200 ml   Net -1200 ml       GEN APPEARANCE: Normal;  A&OX3  HEENT: PERLA; EOMI; Conjunctiva Clear  NECK: Supple; No bruits  CVS: RRR, S1, S2; No M/G/R  LUNGS: CTAB; No Wheezes; No Rhonchi: No rales  ABD: Soft; No TTP; + Normoactive BS  EXT: No edema; Pulses 2+ and intact  SKIN: No rash or Lesions  NEURO: CN 2-12 intact; No Focal neurological deficits    Labs:     Results       Procedure Component Value Units Date/Time    Glucose Whole Blood - POCT [[id number]]  (Abnormal) Collected: 02/06/22 1152     Updated: 02/06/22 1158     Whole Blood Glucose POCT 205 mg/dL     Glucose Whole Blood - POCT [[id number]]  (Abnormal) Collected: 02/06/22 1000     Updated: 02/06/22 1003     Whole Blood Glucose POCT 201 mg/dL     Glucose Whole Blood - POCT [[id number]]  (Abnormal) Collected: 02/06/22 0902     Updated: 02/06/22 0906     Whole Blood Glucose POCT 116 mg/dL     Glucose Whole Blood - POCT [[id number]] Collected: 02/06/22 0844     Updated: 02/06/22 0851     Whole Blood Glucose POCT 78 mg/dL     Glucose Whole Blood - POCT [[id number]]  (Abnormal) Collected: 02/06/22 0843     Updated: 02/06/22 0850     Whole Blood Glucose POCT 69 mg/dL     Glucose Whole Blood - POCT [[id number]]  (Abnormal) Collected: 02/06/22 0826     Updated: 02/06/22 0829     Whole Blood Glucose POCT 69 mg/dL     Glucose Whole Blood  - POCT [[id number]]  (Abnormal) Collected: 02/06/22 0808     Updated: 02/06/22 0814     Whole Blood Glucose POCT 69 mg/dL     Renal function panel [[id number]]  (Abnormal) Collected: 02/06/22 0614    Specimen: Blood Updated: 02/06/22 0658     Glucose 101 mg/dL      Sodium 756 mEq/L      Potassium 4.6 mEq/L      Chloride 107 mEq/L      CO2 19 mEq/L      BUN 73.0 mg/dL      Calcium 7.8 mg/dL      Creatinine 3.2 mg/dL      Albumin 1.8 g/dL      Phosphorus 5.3 mg/dL      Anion Gap 43.3    GFR [[id number]] Collected: 02/06/22 0614     Updated: 02/06/22 0658     EGFR 15.8       CBC without differential [[id number]]  (Abnormal) Collected: 02/06/22 0614    Specimen: Blood Updated: 02/06/22 0641     WBC 13.04 x10 3/uL      Hgb 8.1 g/dL      Hematocrit 09.3 %      Platelets 361 x10 3/uL      RBC 3.04 x10 6/uL      MCV 86.2 fL      MCH 26.6 pg      MCHC 30.9 g/dL      RDW 13 %      MPV 10.6 fL      Nucleated RBC 0.0 /100 WBC      Absolute NRBC 0.00 x10 3/uL  Glucose Whole Blood - POCT [[id number]]  (Abnormal) Collected: 02/06/22 0317     Updated: 02/06/22 0322     Whole Blood Glucose POCT 122 mg/dL     Glucose Whole Blood - POCT [[id number]]  (Abnormal) Collected: 02/05/22 2134     Updated: 02/05/22 2137     Whole Blood Glucose POCT 149 mg/dL     Glucose Whole Blood - POCT [[id number]] Collected: 02/05/22 1626     Updated: 02/05/22 1630     Whole Blood Glucose POCT 83 mg/dL     Protein electrophoresis, serum [[id number]]  (Abnormal) Collected: 02/05/22 0744    Specimen: Blood Updated: 02/05/22 1450     Protein, Total 4.7 g/dL     Immunofixation electrophoresis, Serum [[id number]] Collected: 02/05/22 0744    Specimen: Blood Updated: 02/05/22 1443            Recent CBC   Recent Labs     02/06/22  0614   RBC 3.04*   Hgb 8.1*   Hematocrit 26.2*   MCV 86.2   MCH 26.6   MCHC 30.9*   RDW 13   MPV 10.6     Recent BMP   Recent Labs     02/06/22  0614   Glucose 101*   BUN 73.0*   Creatinine 3.2*   Calcium 7.8*   Sodium 136   Potassium 4.6    Chloride 107   CO2 19     Recent CMP   Recent Labs     02/06/22  0614   Glucose 101*   BUN 73.0*   Creatinine 3.2*   Sodium 136   CO2 19   Calcium 7.8*   Albumin 1.8*     Recent CARDIAC ENZYMES No results for input(s): TROPONIN, ISTATTROPONI, CK, CKMB in the last 24 hours.    Invalid input(s): TROPONINT    Rads:   No results found.  Radiological Procedure reviewed.    Signed by: Robert Bellow, MD

## 2022-02-06 NOTE — PT Progress Note (Signed)
Physical Therapy Treatment   [name]    Unit: [facility]  Bed: Z610/R604-54      Post Acute Care Therapy Recommendations:   Discharge Recommendations:  Home with supervision, Home with outpatient PT  D/C Milestones: Patient is safe to return home with family assistance today.  DME needs IF patient is discharging home: Front wheel walker    Therapy discharge recommendations may change with patient status.  Please refer to most recent note for up-to-date recommendations.    Recommended (non-medical) mode of transportation at discharge:   car    PMP - Progressive Mobility Protocol   PMP Activity: Step 7 - Walks out of Room  Distance Walked (ft) (Step 6,7): 100 Feet    Assessment:      Daughter present and observed therapy session. Pt does better with use of rolling walker for gait with less guarding and fear of falling. Pt fatigues very quickly for age, encouraged frequent short walks to build up endurance. Pt will benefit from out-pt PT for strength and conditioning upon d/c.        Prognosis: Good;With continued PT status post acute discharge    Interdisciplinary Communication:   Patient up in bed with alarm activated; call bell within reach. Updated white communication board in room with patient's current mobility status .  Communicated with RN, via direct contact regarding patient's mobility status.    Education:   Further education provided to patient & dtr on safety with mobility and ADLs, , falls prevention.  Demonstrated  understanding with verbal cues, demonstration and practice of skills.  Will benefit from further training to focus on bed mobility, out of bed activity, transfers, gait and exs.      Plan:   Goals  Pt Will Transfer Bed/Chair: independent;Not met  Pt Will Ambulate: > 200 feet;with rolling walker;modified independent;Not met      Plan  Risks/Benefits/POC Discussed with Pt/Family: With patient  Patient Goal: home today  Treatment/Interventions: Exercise;Gait training;Stair  training;Neuromuscular re-education;LE strengthening/ROM  PT Frequency: 3-4x/wk    Continue plan of care.    ------------------------------------------------------------------------------------------------------------------    Time of treatment:   Start Time: 1010 Stop Time: 1030  Time Calculation (min): 20 min  PT Received On: 02/06/22      Treatment # 4      Precautions  Other Precautions: falls, R hand surgery                        Patient's medical condition is appropriate for Physical Therapy intervention at this time.     Subjective: "I hope I can leave today!"  Nursing clears patient for therapy.  Patient Goal: home today   Pain Assessment  Pain Assessment: No/denies pain                    Objective:  Patient is in bed with no medical equipment in place.    Blood pressure 127/83, pulse 83, temperature 98.2 F (36.8 C), temperature source Oral, resp. rate 16, height 1.676 m (5\' 6" ), weight 135.6 kg (299 lb), SpO2 99 %.    Lab Results   Component Value Date    WBC 13.04 (H) 02/06/2022    HGB 8.1 (L) 02/06/2022    HCT 26.2 (L) 02/06/2022    MCV 86.2 02/06/2022    PLT 361 (H) 02/06/2022          Cognition/Neuro Status  Orientation Level: Oriented X4  Following Commands: Follows all commands  and directions without difficulty  Motor Planning: bradykinesia    Functional Mobility  Rolling: Independent  Supine to Sit: Independent  Scooting to HOB: Independent  Scooting to EOB: Independent  Sit to Supine: Independent  Sit to Stand: Independent  Stand to Sit: Independent    Transfers  Stand Pivot Transfers: Supervision (with or without RW)    Locomotion  Ambulation: Supervision (100 ft x 4 with RW -- limited by fatigue and R distal hand  placed lightly on R walker handle (recent R hand surgery))  Pattern: decreased step length;decreased cadence (increased time and effort)  Stair Management:  (n/a)         Neuro Re-Ed  Sitting Balance: elbow propping;sitting with perturbations all planes;sitting reaching  activities;without support;independent  Standing Balance: standing reaching activities;standing weight shifting all planes;standing with perturbations all planes;supervision (with RW)          Signature: Jorje Vanatta, PT  02/06/2022          Room/Bed:  Z610/R604-54

## 2022-02-23 ENCOUNTER — Ambulatory Visit (INDEPENDENT_AMBULATORY_CARE_PROVIDER_SITE_OTHER): Payer: Medicaid Other

## 2022-02-23 ENCOUNTER — Ambulatory Visit
Admission: EM | Admit: 2022-02-23 | Discharge: 2022-02-23 | Disposition: A | Discharge status: Home / Self Care | Payer: Medicaid Other

## 2022-02-23 DIAGNOSIS — R2231 Localized swelling, mass and lump, right upper limb: Secondary | ICD-10-CM

## 2022-02-23 DIAGNOSIS — M79641 Pain in right hand: Secondary | ICD-10-CM

## 2022-02-23 MED ORDER — PREDNISONE 20 MG PO TABS: 20.0000 mg | ORAL_TABLET | Freq: Every day | ORAL | 0 refills | Status: AC

## 2022-02-23 NOTE — Discharge Instructions (Signed)
-  No fractures. ?-Ice and elevate the hand.  Consider an Ace wrap to help with compression. ?- You may have overused the hand and caused it to become inflamed.  The anti-inflammatory medicine should be helpful.  You can also take Tylenol. ?- Possibility of blood clot but you are taking Eliquis still not very likely but if you noticed increased swelling or worsening pain or you not feeling better in the next couple days you should go to the ER for evaluation to see if you need an ultrasound.  Otherwise, make a follow-up appointment with your doctor. ?

## 2022-02-23 NOTE — ED Provider Notes (Signed)
?[place] ? ? ? ?CSN: [id number] ?Arrival date & time: 02/23/22  1246 ? ? ?  ? ?History   ?Chief Complaint ?Chief Complaint  ?Patient presents with  ? Hand Pain  ?  Right  ? ? ?HPI ?Brooke Martinez is a 41 y.o. female with past medical history of DVT/PE, diabetes, hypertension, CHF, CKD stage IV.  Patient also had right ulnar nerve and ulnar neuroplasty at elbow and wrist for carpal tunnel syndrome in January of this year.  She followed up in the emergency department about 2 to 3 weeks later and was treated for cellulitis.  That was 2 months ago.  Patient is presenting today for right hand swelling that started yesterday and worsened today.  She denies any specific injuries but says she thinks she bumped into something the other day.  Reports she has fragile bones and is afraid of a fracture.  Pain is constant and worse when she tries to make a fist or moves her hand at all.  No associated redness, numbness or tingling.  As mentioned, patient does have history of DVT.  She says she has had a DVT in her upper extremity that was after a blood draw.  No recent blood draws.  She is taking Eliquis.  Patient has not taken anything for her symptoms.  No other complaints today. ? ? ? ?HPI ? ?History reviewed. No pertinent past medical history. ? ?There are no problems to display for this patient. ? ? ?History reviewed. No pertinent surgical history. ? ?OB History   ?No obstetric history on file. ?  ? ? ? ?Home Medications   ? ?Prior to Admission medications   ?Medication Sig Start Date End Date Taking? Authorizing Provider  ?albuterol (PROVENTIL) (2.5 MG/3ML) 0.083% nebulizer solution Take 2.5 mg by nebulization every 6 (six) hours if needed for wheezing.   Yes [provider]  ?apixaban (ELIQUIS) 5 MG TABS tablet Take by mouth.   Yes [provider]  ?ARIPiprazole (ABILIFY) 2 MG tablet Take 2 mg by mouth daily. 01/22/22  Yes [provider]  ?aspirin 81 MG EC tablet Take by mouth.   Yes  [provider]  ?atorvastatin (LIPITOR) 20 MG tablet Take by mouth.   Yes [provider]  ?Azelastine-Fluticasone 137-50 MCG/ACT SUSP Place into the nose. 05/17/21  Yes [provider]  ?baclofen (LIORESAL) 10 MG tablet Take by mouth.   Yes [provider]  ?benzonatate (TESSALON) 200 MG capsule if needed.   Yes [provider]  ?bumetanide (BUMEX) 2 MG tablet Take by mouth. 02/01/20  Yes [provider]  ?buPROPion (WELLBUTRIN XL) 150 MG 24 hr tablet Take by mouth. 03/29/21  Yes [provider]  ?busPIRone (BUSPAR) 10 MG tablet Take by mouth.   Yes [provider]  ?carvedilol (COREG) 25 MG tablet Take by mouth.   Yes [provider]  ?cetirizine (ZYRTEC) 5 MG tablet Take by mouth. 05/20/21 05/20/22 Yes [provider]  ?Continuous Blood Gluc Receiver (DEXCOM G6 RECEIVER) DEVI Use 4-5 times daily. ?Pt is checking BG 4 x/day, is injecting insulin at least 3 x/ day, & makes adjustments based on readings & doing scale. CGM is needed to adjust insulin & make informed dosing decisions. 03/28/21  Yes [provider]  ?diclofenac (VOLTAREN) 50 MG EC tablet Take 50 mg by mouth 2 (two) times daily. 02/19/22  Yes [provider]  ?Docusate Sodium (DSS) 100 MG CAPS Take by mouth. 10/21/20  Yes [provider]  ?Dulaglutide (TRULICITY) 1.5 0000000 SOPN  02/18/21  Yes [provider]  ?DULoxetine (CYMBALTA) 30 MG capsule Take by mouth. 04/19/21 04/19/22 Yes [provider]  ?EPINEPHrine 0.3 mg/0.3 mL IJ SOAJ injection Inject 0.3 mL (0.3 mg) as directed if needed for anaphylaxis. 03/22/16  Yes [provider]  ?FLUoxetine (PROZAC) 40 MG capsule Take 40 mg by mouth daily. 09/05/21  Yes [provider]  ?fluticasone (FLONASE) 50 MCG/ACT nasal spray Place into both nostrils. 02/19/22  Yes [provider]  ?folic acid (FOLVITE) 1 MG tablet Take 1 mg by mouth daily. 02/19/22  Yes  [provider]  ?gabapentin (NEURONTIN) 600 MG tablet Take by mouth. 05/01/20  Yes [provider]  ?guaiFENesin (MUCINEX) 600 MG 12 hr tablet Take by mouth.   Yes [provider]  ?HUMALOG 100 UNIT/ML injection Inject into the skin. 02/19/22  Yes [provider]  ?hydrALAZINE (APRESOLINE) 10 MG tablet Take by mouth.   Yes [provider]  ?hydrOXYzine (ATARAX) 10 MG tablet Take by mouth.   Yes [provider]  ?insulin detemir (LEVEMIR) 100 UNIT/ML injection Inject into the skin. 08/13/20  Yes [provider]  ?lidocaine (LIDODERM) 5 % SMARTSIG:Patch(s) Topical 02/19/22  Yes [provider]  ?loratadine (CLARITIN) 10 MG tablet Take 10 mg by mouth daily. 02/19/22  Yes [provider]  ?meclizine (ANTIVERT) 25 MG tablet Take by mouth.   Yes [provider]  ?metolazone (ZAROXOLYN) 10 MG tablet Take by mouth. 12/25/20  Yes [provider]  ?montelukast (SINGULAIR) 10 MG tablet  07/10/21  Yes [provider]  ?ofloxacin (OCUFLOX) 0.3 % ophthalmic solution  11/26/21  Yes [provider]  ?olmesartan (BENICAR) 20 MG tablet Take by mouth. 05/01/20  Yes [provider]  ?omeprazole (PRILOSEC) 40 MG capsule Take by mouth.   Yes [provider]  ?predniSONE (DELTASONE) 20 MG tablet Take 1 tablet (20 mg total) by mouth daily for 5 days. 02/23/22 02/28/22 Yes Danton Clap, PA-C  ?promethazine (PHENERGAN) 50 MG tablet Take 50 mg by mouth 3 (three) times daily. 02/19/22  Yes [provider]  ?Semaglutide,0.25 or 0.5MG /DOS, (OZEMPIC, 0.25 OR 0.5 MG/DOSE,) 2 MG/1.5ML SOPN Inject into the skin.   Yes [provider]  ?sertraline (ZOLOFT) 25 MG tablet Take 25 mg by mouth daily. 01/31/22  Yes [provider]  ?topiramate (TOPAMAX) 100 MG tablet Take 100 mg by mouth daily. 09/05/21  Yes [provider]  ?traZODone (DESYREL) 50 MG tablet Take 50 mg by mouth at bedtime. 01/31/22   Yes [provider]  ?valACYclovir (VALTREX) 1000 MG tablet Take by mouth.   Yes [provider]  ?VENTOLIN HFA 108 (90 Base) MCG/ACT inhaler Inhale into the lungs. 02/19/22  Yes [provider]  ? ? ?Family History ?History reviewed. No pertinent family history. ? ?Social History ?Social History  ? ?Tobacco Use  ? Smoking status: Never  ? Smokeless tobacco: Never  ?Vaping Use  ? Vaping Use: Never used  ?Substance Use Topics  ? Alcohol use: Never  ? Drug use: Never  ? ? ? ?Allergies   ?Amlodipine, Glipizide, Sulfamethoxazole-trimethoprim, Lisinopril, and Losartan ? ? ?Review of Systems ?Review of Systems  ?Constitutional:  Negative for fatigue and fever.  ?Musculoskeletal:  Positive for arthralgias and joint swelling.  ?Skin:  Negative for color change, rash and wound.  ?Neurological:  Negative for weakness and numbness.  ? ? ?Physical Exam ?Triage Vital Signs ?ED Triage Vitals  ?Enc  Vitals Group  ?   BP   ?   Pulse   ?   Resp   ?   Temp   ?   Temp src   ?   SpO2   ?   Weight   ?   Height   ?   Head Circumference   ?   Peak Flow   ?   Pain Score   ?   Pain Loc   ?   Pain Edu?   ?   Excl. in [place]?   ? ?No data found. ? ?Updated Vital Signs ?BP (!) 157/89 (BP Location: Left Arm)   Pulse 85   Temp 98.2 ?F (36.8 ?C) (Oral)   Resp 18   Ht 5\' 6"  (1.676 m)   Wt 290 lb (131.5 kg)   SpO2 100%   BMI 46.81 kg/m?  ?   ? ?Physical Exam ?Vitals and nursing note reviewed.  ?Constitutional:   ?   General: She is not in acute distress. ?   Appearance: Normal appearance. She is not ill-appearing or toxic-appearing.  ?HENT:  ?   Head: Normocephalic and atraumatic.  ?Eyes:  ?   General: No scleral icterus.    ?   Right eye: No discharge.     ?   Left eye: No discharge.  ?   Conjunctiva/sclera: Conjunctivae normal.  ?Cardiovascular:  ?   Rate and Rhythm: Normal rate and regular rhythm.  ?   Pulses: Normal pulses.  ?   Heart sounds: Normal heart sounds.  ?Pulmonary:  ?   Effort: Pulmonary effort is normal.  No respiratory distress.  ?Musculoskeletal:  ?   Cervical back: Neck supple.  ?   Comments: Right hand: There is mild to moderate edema of the right dorsal hand.  No overlying erythema, warmth, lacerations or

## 2022-02-23 NOTE — ED Triage Notes (Signed)
Pt c/o right hand pain x2days. ? ?Pt states that her hand was sore at night before bed. Pt states that her hand was swollen this morning upon waking up.  ? ?Pt recently had surgery on the palm of her hand 11/2021. Pt states that the pain is along the back of the hand. ? ?Pt states that it feels like her hand is broken. She is diabetic and is worried about a possible break.  ?

## 2022-02-24 ENCOUNTER — Emergency Department: Payer: BC Managed Care – PPO

## 2022-02-24 ENCOUNTER — Emergency Department
Admission: EM | Admit: 2022-02-24 | Discharge: 2022-02-24 | Disposition: A | Discharge status: Home / Self Care | Payer: BC Managed Care – PPO | Attending: Emergency Medicine | Admitting: Emergency Medicine

## 2022-02-24 DIAGNOSIS — M67441 Ganglion, right hand: Secondary | ICD-10-CM

## 2022-02-24 DIAGNOSIS — M25531 Pain in right wrist: Secondary | ICD-10-CM | POA: Insufficient documentation

## 2022-02-24 DIAGNOSIS — M7989 Other specified soft tissue disorders: Secondary | ICD-10-CM

## 2022-02-24 DIAGNOSIS — M67431 Ganglion, right wrist: Secondary | ICD-10-CM | POA: Insufficient documentation

## 2022-02-24 NOTE — EDIE (Signed)
COLLECTIVE?NOTIFICATION?02/24/2022 18:27?BRIANNIE, GUTIERREZ T?MRN: [id number]    Criteria Met      5 ED Visits in 12 Months    Security and Safety  No Security Events were found.  ED Care Guidelines  There are currently no ED Care Guidelines for this patient. Please check your facility's medical records system.    Flags      Negative COVID-19 Lab Result - [facility] - A specimen collected from this patient was negative for COVID-19 / Attributed By: [place] Department of Health / Attributed On: 02/04/2022       Prescription Monitoring Program  140??- Narcotic Use Score  070??- Sedative Use Score  000??- Stimulant Use Score  230??- Overdose Risk Score  - All Scores range from 000-999 with 75% of the population scoring < 200 and on 1% scoring above 650  - The last digit of the narcotic, sedative, and stimulant score indicates the number of active prescriptions of that type  - Higher Use scores correlate with increased prescribers, pharmacies, mg equiv, and overlapping prescriptions  - Higher Overdose Risk Scores correlate with increased risk of unintentional overdose death   Concerning or unexpectedly high scores should prompt a review of the PMP record; this does not constitute checking PMP for prescribing purposes.    E.D. Visit Count (12 mo.)  Facility Visits   [facility] 1   [facility] - Visits 1   [place] [facility] 6   [place] [facility] 1   [place] [facility] 1   Total 10   Note: Visits indicate total known visits.     Recent Emergency Department Visit Summary  Date Facility [facility] Type Diagnoses or Chief Complaint    Feb 24, 2022  [place] [facility] [place] [facility]  [place]  [place]  Emergency      Possible blod clot in habd- kidney patient      Feb 02, 2022  [facility] [place] [facility]  [place]  [place]  Emergency      Neck, back , Flank pain; Fever; leg pain; Hx of Blood clot      Otalgia      Knee Pain      Back Pain      Acute kidney failure, unspecified      Dec 17, 2021  [place] [facility] [place] [facility]  [place]  [place]   Emergency      wound check post surgery      Post-op Problem      Wrist Pain      Pain in right arm      Other specified symptoms and signs involving the circulatory and respiratory systems      Encounter for other specified surgical aftercare      Oct 05, 2021  [place] [facility] [place] [facility]  [place]  [place]  Emergency      triage      triage - cp, sob, edema      Shortness of Breath      Chest Pain      Viral infection, unspecified      Disorder of kidney and ureter, unspecified      Essential (primary) hypertension      Oct 01, 2021  [place] [facility] [facility]  [place]  [place]   Emergency      Pinched Nerve Right side/Stiff      Arm Injury      Unspecified fall, initial encounter      Strain of unspecified muscle, fascia and tendon at wrist  and hand level, right hand, initial encounter      Lesion of ulnar nerve, right upper limb      Aug 16, 2021  [facility] - Visits  [place]  [place]  Urgent Care      Localized edema      Other forms of dyspnea      Jun 29, 2021  [place] [facility] [facility]  [place]  [place]  Emergency      body aches, vomiting, diarrhea      Generalized Body Aches      Diarrhea      Emesis      Dizziness      Headache      COVID-19      Disorder of kidney and ureter, unspecified      Anemia, unspecified      Apr 29, 2021  [place] [facility] [place] [facility]  [place]  [place]  Emergency      Syncope      Syncope and collapse      Dizziness and giddiness      Abnormal finding of blood chemistry, unspecified      Other specified abnormal findings of blood chemistry      Apr 24, 2021  [place] [place]  [place]  Emergency      1. Syncope and collapse      2. 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 Heart failure, unspecified      4. Type 1 diabetes mellitus with diabetic chronic kidney disease      5. Long term (current) use of insulin      6. Long term (current) use of anticoagulants      7. Contact with and (suspected)  exposure to COVID-19      8. Personal history of pulmonary embolism      9. Other specified postprocedural states      10. Allergy status to other antibiotic agents      Mar 18, 2021  [place] [facility] [place] [facility]  [place]  [place]  Emergency      sob;asthma      Shortness of Breath      Cough      Disorder of kidney and ureter, unspecified      Unspecified asthma with (acute) exacerbation        Recent Inpatient Visit Summary  Date Facility [facility] Type Diagnoses or Chief Complaint    Feb 02, 2022  [facility] [place] [facility]  [place]  [place]  Medical Surgical      Acute kidney failure, unspecified        Care Team  Provider Specialty Phone Fax Service Dates   [facility] Clinic/Center: [facility]) [phone] Current    Ricard Dillon , MD Internal Medicine [phone] Current      Collective Portal  This patient has registered at the [facility] Emergency Department   For more information visit: [email or web address]     PLEASE NOTE:     1.   Any care recommendations and other clinical information are provided as guidelines or for historical purposes only, and providers should exercise their own clinical judgment when providing care.    2.   You may only use this information for purposes of  treatment, payment or health care operations activities, and subject to the limitations of applicable [employer].    3.   You should consult directly with the organization that provided a care guideline or other clinical history with any questions about additional information or accuracy or completeness of information provided.    ? 2023 [employer] - www.collectivemedical.com

## 2022-02-24 NOTE — Discharge Instructions (Addendum)
You do not have a blood clot of your arm. Follow-up with your family physician if symptoms persist. If symptoms worsen, return to the ED for evaluation.

## 2022-02-24 NOTE — ED Triage Notes (Signed)
[facility] EMERGENCY DEPARTMENT Provider in Triage Note        Patient Name: [name]    Chief Complaint: No chief complaint on file.      HPI: [name] is a 41 y.o. female, who has had a rapid medical screening evaluation initiated by myself for the chief complaint of right hand pain and swelling since 3 days ago. Pt was discharged from hospital recently due to blood clot in legs for which she is currently taking eliquis. Pt states she believes she has a blood clot in her hand and reports hx of blood clot in arm before. Denies difficulty breathing or CP.      ROS: Please see HPI.     Medical/Surgical/Social history: as per HPI    Vitals: There were no vitals taken for this visit.    Pertinent brief exam:   Constitutional: No acute distress.Well-nourished.  Cardiovascular: normal rate  Pulmonary: Normal effort.  No respiratory distress. No stridor.  Skin: Normal skin color. No diaphoresis.  Psych: Normal affect.     Additional pertinent physical exam findings: right hand with swelling, mild erythema, and pain, radial pulse 2+    Preliminary orders: RUE [place]    Symptom based diagnosis: hand pain/swelling      Patient advised to remain in the ED until further evaluation can be performed. Patient instructed to notify staff of any changes in condition while waiting.    This assessment is an initial evaluation to expedite care.

## 2022-02-24 NOTE — ED Triage Notes (Signed)
The patient is coming in for concern of having a blood clot in her arm. She reports that she was recently hospitalized for a blood clot in her leg and is on eliquis, but she began to notice pain and swelling in her right arm that began two days ago. Denies shortness of breath or chest pain. She reports that her right hand feels numb. Strong radial pulse present.

## 2022-02-25 NOTE — ED Provider Notes (Signed)
ED PHYSICIAN NOTE              Patient: [name]   MRN:  [id number]          History of Present Illness            Chief Complaint:   Chief Complaint   Patient presents with    Blood Clot Rule Out          [name] is a 41 y.o. female presenting to the ED with R dorsal wrist swelling. Pt was recently hospitalized and had IV in R AC placed. Has no issues of the upper arm or antecubital fossa but reports new lump on dorsum of right hand that is painful to touch. Denies erythema, fevers, or associated edema. Sxs constant, no noted alleviating factors.          Medical Decision Making      Chart ownership: I am the primary physician of record for this patient encounter.    I reviewed the vital signs, available nursing notes, past medical history, past surgical history, family history and social history.    Record Review: The following, if applicable to the case, were reviewed and noted:    Old medical records.  Nursing notes.  Outside records.       CLINICAL DECISION-MAKING:  41 yo F with painful lump R hand c/w ganglion cyst. DVT study ordered from triage prior to my evaluation, negative for DVT/SVT of the RUE. Labs clinically not indicated. Recommend anti-inflammatories, has CKD III, recommend local voltaren gel PRN (prescribed) rather than systemic NSAIDs.     History obtained from: pt    Pertinent vitals:   I reviewed pt's pulse oxymetry and cardiac monitor values, as relevant to the case.  Pulse oxymetry analysis: 99% RA  Normotensive, afebrile    Labs: Reviewed contemporaneously  Not indicated    Imaging: Reviewed contemporaneously  RUE DVT study negative     ED medications received  Medications - No data to display    The above-stated information was discussed with pt at bedside.  All questions and concerns were addressed prior to disposition.         Diagnosis and Disposition          1. Pain and swelling of right upper extremity        2. Ganglion cyst of tendon sheath of right hand             ED Disposition       ED Disposition   Discharge    Condition   --    Date/Time   Mon Feb 24, 2022  8:17 PM    Comment   [name] discharge to home/self care.    Condition at disposition: Stable                  Physical Exam           Vitals:    02/24/22 1839   BP: 157/80   Pulse: 97   Resp: 18   Temp: 98.3 F (36.8 C)   SpO2: 99%         Physical Exam  Constitutional: WDWN NAD  HENT: conjunctiva normal, moist mucous membranes  Neck: supple  Respiratory: CTAB AP, even unlabored respirations   Cardiovascular: RRR, capillary refill <2 seconds  Abdomen: soft, non-tender, non-distended  MSK: well circumscribed tender mass dorsum R hand c/w ganglion cyst  Neuro: A+Ox3, answers questions appropriately   Skin:  Warm and dry, no rashes  Psych: Normal mood and affect        Past History        Past medical history:  Past Medical History:   Diagnosis Date    Anemia     Arthritis     Asthma     Chronic kidney disease     Stage III    Congestive heart failure     Diabetes mellitus     Fibromyalgia     Gastroesophageal reflux disease     Hyperlipidemia     Hypertension     Neuropathy     Pulmonary embolism 08/2019    Renal insufficiency     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     Scoliosis     Sleep apnea     no cpap    Type 2 diabetes mellitus, controlled     Vertigo      Past surgical history:  Past Surgical History:   Procedure Laterality Date    ABLATION ENDOMETRIAL      APPENDECTOMY (OPEN)      CESAREAN SECTION      x 2    CHOLECYSTECTOMY      EYE SURGERY Bilateral     cataract    HYSTERECTOMY  12/2019    REPAIR, UPPER EXTREMITY, TENDON & NERVE Right 12/05/2021    Procedure: REPAIR, RIGHT ULNAR NERVE AND NEUROPLASTY @ ELBOW AND WRIST, RIGHT OPEN CARPAL TUNNEL RELEASE;  Surgeon: Cathrine Muster, MD;  Location: [facility];  Service: Plastics;  Laterality: Right;    UTERINE RUPTURE       Family history:  Family History   Problem Relation Age of Onset    Heart disease Mother     Hypertension Mother      Diabetes Mother     Asthma Father     Hypertension Father     Diabetes Daughter     Asthma Son     Cancer Maternal Aunt     Cancer Maternal Grandmother      Social history:   Social History     Tobacco Use    Smoking status: Never    Smokeless tobacco: Never   Vaping Use    Vaping status: Never Used   Substance Use Topics    Alcohol use: Never    Drug use: Never     Allergies: Reviewed and up-to-date in pt's chart       Allergies and Medications          Allergies   Allergen Reactions    Bactrim [Sulfamethoxazole-Trimethoprim] Hives, Respiratory Distress and Edema    Glipizide Edema     Throat swelling    Lisinopril Edema     Tongue swelling         Home Medications       Med List Status: In Progress Set By: Haskell Riling, RN at 02/24/2022  7:22 PM              albuterol sulfate HFA (PROVENTIL) 108 (90 Base) MCG/ACT inhaler     Inhale 2 puffs into the lungs every 4 (four) hours as needed for Wheezing     Alcohol Swabs Pads     Use as indicated for insulin injections     apixaban (ELIQUIS) 5 MG     Take 5 mg by mouth every 12 (twelve) hours     ARIPiprazole (ABILIFY) 5 MG tablet     Take 10 mg by mouth daily  aspirin 81 MG chewable tablet     Chew 81 mg by mouth daily     atorvastatin (LIPITOR) 40 MG tablet     Take 80 mg by mouth nightly     Azelastine-Fluticasone 137-50 MCG/ACT Suspension     1 spray by Nasal route 2 (two) times daily     baclofen (LIORESAL) 10 MG tablet     Take 10 mg by mouth as needed     bumetanide (BUMEX) 2 MG tablet     Take 1 tablet (2 mg total) by mouth 2 (two) times daily     buPROPion XL (WELLBUTRIN XL) 150 MG 24 hr tablet     Take 150 mg by mouth daily     carvedilol (COREG) 25 MG tablet     Take 25 mg by mouth 2 (two) times daily     cetirizine (ZyrTEC) 5 MG tablet     Take 5 mg by mouth daily     diclofenac (VOLTAREN) 50 MG EC tablet     Take 50 mg by mouth daily     docusate sodium (COLACE) 100 MG capsule     Take 100 mg by mouth nightly     Dulera 200-5 MCG/ACT Aerosol     Inhale 2  puffs into the lungs daily     DULoxetine (CYMBALTA) 60 MG capsule     Take 60 mg by mouth daily     EPINEPHrine 0.15 MG/0.3ML injection     Inject 0.15 mg into the muscle as needed     folic acid (FOLVITE) 1 MG tablet     Take 1 mg by mouth daily     gabapentin (NEURONTIN) 800 MG tablet     Take 800 mg by mouth 3 (three) times daily     hydrALAZINE (APRESOLINE) 50 MG tablet     Take 50 mg by mouth 2 (two) times daily     hydrOXYzine (ATARAX) 10 MG tablet     Take 10 mg by mouth every 6 (six) hours as needed for Anxiety     insulin detemir (Levemir) 100 UNIT/ML injection     Inject 50 Units into the skin every 12 (twelve) hours     Patient taking differently: Inject 53 Units into the skin every 12 (twelve) hours     insulin lispro (HumaLOG) 100 UNIT/ML injection     Inject 25 Units into the skin 3 (three) times daily before meals     Patient taking differently: Inject 25 Units into the skin 3 (three) times daily before meals     Insulin Syringe-Needle U-100 27G X 1/2" 1 ML Misc     Inject 1 Dose into the skin 4 (four) times daily     lidocaine (LIDODERM) 5 %     Place 1 patch onto the skin every 24 hours Remove & Discard patch within 12 hours or as directed by MD     meclizine (ANTIVERT) 25 MG tablet     Take 25 mg by mouth as needed     metOLazone (ZAROXOLYN) 10 MG tablet     Take 10 mg by mouth daily     montelukast (SINGULAIR) 10 MG tablet     Take 10 mg by mouth nightly     olmesartan (BENICAR) 40 MG tablet     Take 40 mg by mouth daily     omeprazole (PriLOSEC) 40 MG capsule     Take 40 mg by mouth nightly     Semaglutide,0.25  or 0.5MG /DOS, (Ozempic, 0.25 or 0.5 MG/DOSE,) 2 MG/1.5ML Solution Pen-injector     Inject into the skin     topiramate (TOPAMAX) 100 MG tablet     Take 100 mg by mouth daily     traZODone (DESYREL) 150 MG tablet     Take 150 mg by mouth     Trulicity 1.5 MG/0.5ML Solution Pen-injector     Inject 1.5 mg into the skin Once each week on Monday     valACYclovir (VALTREX) 1000 MG tablet      Take 1 g by mouth as needed                 Review of Systems        See HPI for pertinent positive and negative ROS.       Diagnostic Studies          Labs:  Labs Reviewed - No data to display      Radiological Studies:  [place] VenoDopp Upper Extremity Right    Result Date: 02/24/2022  Date: 02/24/2022 7:30 PM HISTORY: Right hand pain and swelling for 3 days COMPARISON: None PROCEDURE: Real-time grayscale and color Doppler imaging is performed of the right upper extremity. FINDINGS: No persistent intraluminal filling defects are identified. Proximal and augmentation is seen throughout. Overlying soft tissues show no focal cystic or solid mass. There is mild edema.      No evidence of deep venous thrombosis. Ida Rogue, MD 02/24/2022 8:10 PM    Echocardiogram Adult Complete W Color Doppler Waveform    Result Date: 02/04/2022  Name:     Banita Lehn Age:     40 years DOB:     1981/10/24 Gender:     Female MRN:     [id number] Wt:     299 lb BSA:     2.59 m2 Systolic BP:     478 mmHg Diastolic BP:     68 mmHg Technical Quality:     Good Exam Date/Time:     02/04/2022 10:11 AM Study Site:     [facility] Exam Type:     ECHOCARDIOGRAM ADULT COMPLETE W CLR/ DOPP WAVEFORM Study Info Indications     Acute heart failure - Procedure   Complete two-dimensional, color flow and spectral Doppler transthoracic echocardiogram is performed. Reading Physician:     Garth Bigness MD Staff Sonographer:     Lorenza Evangelist Ordering Physician:     Phil Dopp MD 66 in Summary   * The left ventricle is normal in size.   * There is mild concentric left ventricular hypertrophy.   * Left ventricular systolic function is normal with an ejection fraction by Biplane Method of Discs of  65 %.   * Left ventricular segmental wall motion is grossly normal, but subtle wall motion abnormalities cannot be excluded as technically limited study.   * Left ventricular diastolic filling parameters are consistent with Grade I diastolic dysfunction (impaired  relaxation pattern).   * No significant valvular dysfunction.   * Compared to the prior study, there has been no significant change. Findings Left Ventricle   The left ventricle is normal in size. There is mild concentric left ventricular hypertrophy. Left ventricular systolic function is normal with an ejection fraction by Biplane Method of Discs of  65 %. Left ventricular segmental wall motion is grossly normal, but subtle wall motion abnormalities cannot be excluded as technically limited study. Left ventricular diastolic filling parameters are consistent with Grade I  diastolic dysfunction (impaired relaxation pattern). Right Ventricle   The right ventricular cavity size is normal in size. Normal right ventricular systolic function. Left Atrium   The left atrium is normal in size. Right Atrium   The right atrium is normal in size. Atrial Septum   No evidence of interatrial shunt by color Doppler. Aortic Valve   The aortic valve is tricuspid. There is no aortic stenosis. There is no aortic regurgitation. Pulmonary Valve   The pulmonic valve is not well visualized. There is no pulmonic regurgitation. Mitral Valve   The mitral valve is structurally normal. There is trace mitral regurgitation. Tricuspid Valve   The tricuspid valve is structurally normal. There is trace tricuspid regurgitation. No pulmonary hypertension with estimated right ventricular systolic pressure of  29 mmHg. Aorta   The aortic root is normal in size. The ascending aorta is upper limits of normal in size. Inferior Vena Cava   The IVC is not well visualized, probably normal. Pericardium / Pleural Effusion   No pericardial effusion visualized. Measurements 2D Measurements ---------------------------------------------------------------------- Name                                 Value        Normal ---------------------------------------------------------------------- Parasternal 2D  ----------------------------------------------------------------------  IVS Diastolic Thickness (2D)                               1.21 cm     0.60-0.90 LVID Diastole (2D)                 4.90 cm     3.80-5.20  LVIW Diastolic Thickness (2D)                               1.22 cm     0.60-0.95 LVID Systole (2D)                  3.09 cm     2.20-3.50 LVOT Diameter                      2.00 cm               LA Dimension (2D)                  3.90 cm     2.70-3.80 Ao Root Diameter (2D)              3.60 cm     2.70-3.70 Prox Asc Ao Diameter               3.60 cm     2.30-3.10 LV Ejection Fraction 2D ---------------------------------------------------------------------- LV EF (BP MOD)                        65 %         54-74 Apical 2D Dimensions ----------------------------------------------------------------------  RV Basal Diastolic Dimension                          3.26 cm     2.50-4.10 LA Volume Index (BP A-L)        26.43 ml/m2       <=34.00 RA Area (4C)  13.20 cm2       <=18.00 M-mode Measurements ---------------------------------------------------------------------- Name                                 Value        Normal ---------------------------------------------------------------------- M-Mode ---------------------------------------------------------------------- TAPSE                              2.67 cm        >=1.60 LVOT/Aortic Valve Doppler Measurements ---------------------------------------------------------------------- Name                                 Value        Normal ---------------------------------------------------------------------- LVOT Doppler ---------------------------------------------------------------------- LVOT Peak Velocity                1.26 m/s               AoV Doppler ---------------------------------------------------------------------- AV Peak Velocity                  1.40 m/s               AV Mean Gradient                    5 mmHg           <20 AV  Area (Cont Eq VTI)             3.00 cm2        >=3.00 AV V1/V2 Ratio                        0.90 RVOT/Pulmonic Valve Doppler Measurements ---------------------------------------------------------------------- Name                                 Value        Normal ---------------------------------------------------------------------- PV Doppler ---------------------------------------------------------------------- PV Peak Velocity                  0.94 m/s Mitral Valve Measurements ---------------------------------------------------------------------- Name                                 Value        Normal ---------------------------------------------------------------------- MV Doppler ---------------------------------------------------------------------- MV E Peak Velocity                0.83 m/s               MV A Peak Velocity                0.76 m/s               MV E/A                                1.09               MV Annular TDI ---------------------------------------------------------------------- MV Septal e' Velocity             0.05 m/s        >=0.08 MV E/e' (Septal)  16.86        <=8.00 MV Lateral e' Velocity            0.06 m/s        >=0.10 MV E/e' (Lateral)                    13.43        <=8.00 Tricuspid Valve Measurements ---------------------------------------------------------------------- Name                                 Value        Normal ---------------------------------------------------------------------- TV Regurgitation Doppler ---------------------------------------------------------------------- TR Peak Velocity                  2.29 m/s               TR Peak Gradient                   21 mmHg               RA Pressure                         8 mmHg           <=3 RV Systolic Pressure               29 mmHg           <36 Aorta / Venous Measurements ---------------------------------------------------------------------- Name                                 Value         Normal ---------------------------------------------------------------------- IVC/SVC ---------------------------------------------------------------------- IVC Diameter (Exp 2D)              2.34 cm        <=2.10 Report Signatures Finalized by Garth Bigness  MD on 02/04/2022 11:33 AM Preliminary by Lorenza Evangelist on 02/04/2022 11:02 AM    [place] VenoDopp Low Extremity Left    Result Date: 02/02/2022  HISTORY: Left lower extremity edema and pain. COMPARISON: None available. TECHNIQUE: Wallace Cullens scale, color flow, and spectral Doppler waveform analysis was performed on the LEFT lower extremity veins described below. There is normal compressibility, phasic flow, and response to augmentation unless otherwise noted. FINDINGS: Limited exam secondary to patient body habitus. LEFT LOWER EXTREMITY: Common femoral vein: Normal Deep femoral vein (proximal portion): Normal Greater saphenous vein at the saphenofemoral junction: Normal Femoral vein (proximal): Normal Femoral vein (mid): Partially compressible, nonocclusive thrombus. Femoral vein (distal): Partially compressible, nonocclusive thrombus. Popliteal vein: Normal Peroneal veins: Normal Posterior tibial veins: Normal Soft tissues: No Baker's cyst identified. The right common femoral vein (imaged per protocol) is patent.      1. Nonocclusive deep venous thrombosis of the LEFT lower extremity mid and distal superficial femoral vein. Critical results were discussed with Dr. Lorene Dy by Dr. Jackey Loge at 11:20 PM on 02/02/2022. Judd Gaudier, MD 02/02/2022 11:30 PM    XR Chest  AP Portable    Result Date: 02/02/2022  HISTORY:  Edema COMPARISON: 08/05/2021 FINDINGS: Cardiac silhouette is borderline enlarged. Pulmonary vasculature is within normal limits. No consolidation or pleural effusion is identified. Hila are unremarkable.      Negative for acute process  Gerlene Burdock, MD 02/02/2022 11:15 PM        ---------------------------------------------------------------------------------------------  This note  was generated by the [employer] EMR system/ Dragon speech recognition and may contain inherent errors or omissions not intended by the user. Grammatical errors, random word insertions, deletions and pronoun errors  are occasional consequences of this technology due to software limitations. Not all errors are caught or corrected. If there are questions or concerns about the content of this note or information contained within the body of this dictation they should be addressed directly with the author for clarification         Marlis EdelsonShirke, Cypher Paule V, DO  02/27/22 1846

## 2022-02-27 MED ORDER — DICLOFENAC SODIUM 1 % EX GEL
2.0000 g | Freq: Four times a day (QID) | CUTANEOUS | 0 refills | Status: AC
Start: 2022-02-27 — End: ?

## 2022-03-05 ENCOUNTER — Emergency Department
Admission: EM | Admit: 2022-03-05 | Discharge: 2022-03-05 | Disposition: A | Discharge status: Home / Self Care | Payer: BC Managed Care – PPO | Attending: Emergency Medicine | Admitting: Emergency Medicine

## 2022-03-05 DIAGNOSIS — Z794 Long term (current) use of insulin: Secondary | ICD-10-CM | POA: Insufficient documentation

## 2022-03-05 DIAGNOSIS — H02843 Edema of right eye, unspecified eyelid: Secondary | ICD-10-CM | POA: Insufficient documentation

## 2022-03-05 DIAGNOSIS — L02821 Furuncle of head [any part, except face]: Secondary | ICD-10-CM | POA: Insufficient documentation

## 2022-03-05 DIAGNOSIS — H1131 Conjunctival hemorrhage, right eye: Secondary | ICD-10-CM | POA: Insufficient documentation

## 2022-03-05 DIAGNOSIS — E119 Type 2 diabetes mellitus without complications: Secondary | ICD-10-CM | POA: Insufficient documentation

## 2022-03-05 MED ORDER — ACETAMINOPHEN 500 MG PO TABS
1000.0000 mg | ORAL_TABLET | Freq: Once | ORAL | Status: AC
Start: 2022-03-05 — End: 2022-03-05
  Administered 2022-03-05: 1000 mg via ORAL
  Filled 2022-03-05: qty 2

## 2022-03-05 MED ORDER — LIDOCAINE-EPINEPHRINE 2 %-1:100000 IJ SOLN
20.0000 mL | Freq: Once | INTRAMUSCULAR | Status: AC
Start: 2022-03-05 — End: 2022-03-05
  Administered 2022-03-05: 20 mL
  Filled 2022-03-05: qty 20

## 2022-03-05 MED ORDER — DOXYCYCLINE HYCLATE 100 MG PO TABS
100.0000 mg | ORAL_TABLET | Freq: Two times a day (BID) | ORAL | 0 refills | Status: AC
Start: 2022-03-05 — End: 2022-03-12

## 2022-03-05 MED ORDER — LIDOCAINE 4 % EX CREA
TOPICAL_CREAM | Freq: Once | CUTANEOUS | Status: AC
Start: 2022-03-05 — End: 2022-03-05
  Administered 2022-03-05: 5 g via TOPICAL
  Filled 2022-03-05: qty 5

## 2022-03-05 MED ORDER — BACITRACIN +/- ZINC 500 UNIT/GM EX OINT (WRAP)
TOPICAL_OINTMENT | Freq: Once | CUTANEOUS | Status: AC
Start: 2022-03-05 — End: 2022-03-05
  Administered 2022-03-05: 1 g via TOPICAL
  Filled 2022-03-05: qty 1

## 2022-03-05 MED ORDER — DOXYCYCLINE MONOHYDRATE 50 MG PO CAPS
100.0000 mg | ORAL_CAPSULE | Freq: Once | ORAL | Status: AC
Start: 2022-03-05 — End: 2022-03-05
  Administered 2022-03-05: 100 mg via ORAL
  Filled 2022-03-05: qty 2

## 2022-03-05 NOTE — ED Triage Notes (Signed)
Pt reports several cysts on head and woke up this am with a swollen right eyelid and is unable to open right eye. Eye swollen shut, skin intact, no drainage noted.

## 2022-03-05 NOTE — ED Notes (Signed)
Discharged ambulatory in no apparent distress. Pt verbalizes instructions to drive directly to ophthalmology.

## 2022-03-05 NOTE — ED Provider Notes (Signed)
EMERGENCY DEPARTMENT NOTE     Patient initially seen and examined at   ED PHYSICIAN ASSIGNED       None           ED MIDLEVEL (APP) ASSIGNED       Date/Time Event User Comments    03/05/22 1053 PA/NP Provider Assigned Blair Mesina L Adyn Hoes L, PA assigned as Physician Assistant            HISTORY OF PRESENT ILLNESS   {Translator Used (Optional):59393}    Chief Complaint: Facial Swelling       41 y.o. female with past medical history as below c/o cysts on right side of her head, put neosporin on it and woke up with right eye swollen shut this morning, very itchy. Inner corner of eye is painful but denies eyeball pain. Reports recent eye procedure on 4/20 with Dr. Lovell Sheehan at [facility] [place].  BG 300 this morning.   Denies SOB, oral swelling, fever, difficulty swallowing or drooling.   On singulair and claritin for allergies      Independent Historian (other than patient): {Historian:59024}  Additional History Provided by Independent Historian:  MEDICAL HISTORY     Past Medical History:  Past Medical History:   Diagnosis Date   . Anemia    . Arthritis    . Asthma    . Chronic kidney disease     Stage III   . Congestive heart failure    . Diabetes mellitus    . Fibromyalgia    . Gastroesophageal reflux disease    . Hyperlipidemia    . Hypertension    . Neuropathy    . Pulmonary embolism 08/2019   . Renal insufficiency    . 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 Scoliosis    . Sleep apnea     no cpap   . Type 2 diabetes mellitus, controlled    . Vertigo        Past Surgical History:  Past Surgical History:   Procedure Laterality Date   . ABLATION ENDOMETRIAL     . APPENDECTOMY (OPEN)     . CESAREAN SECTION      x 2   . CHOLECYSTECTOMY     . EYE SURGERY Bilateral     cataract   . HYSTERECTOMY  12/2019   . REPAIR, UPPER EXTREMITY, TENDON & NERVE Right 12/05/2021    Procedure: REPAIR, RIGHT ULNAR NERVE AND NEUROPLASTY @ ELBOW AND WRIST, RIGHT OPEN CARPAL TUNNEL RELEASE;  Surgeon: Cathrine Muster, MD;   Location: [facility];  Service: Plastics;  Laterality: Right;   . UTERINE RUPTURE         Social History:  Social History     Socioeconomic History   . Marital status: Single   Tobacco Use   . Smoking status: Never   . Smokeless tobacco: Never   Vaping Use   . Vaping status: Never Used   Substance and Sexual Activity   . Alcohol use: Never   . Drug use: Never   Social History Narrative    ** Merged History Encounter **            Family History:  Family History   Problem Relation Age of Onset   . Heart disease Mother    . Hypertension Mother    . Diabetes Mother    . Asthma Father    . Hypertension Father    . Diabetes Daughter    .  Asthma Son    . Cancer Maternal Aunt    . Cancer Maternal Grandmother        Outpatient Medication:  Previous Medications    ALBUTEROL SULFATE HFA (PROVENTIL) 108 (90 BASE) MCG/ACT INHALER    Inhale 2 puffs into the lungs every 4 (four) hours as needed for Wheezing    ALCOHOL SWABS PADS    Use as indicated for insulin injections    APIXABAN (ELIQUIS) 5 MG    Take 5 mg by mouth every 12 (twelve) hours    ARIPIPRAZOLE (ABILIFY) 5 MG TABLET    Take 10 mg by mouth daily    ASPIRIN 81 MG CHEWABLE TABLET    Chew 81 mg by mouth daily    ATORVASTATIN (LIPITOR) 40 MG TABLET    Take 80 mg by mouth nightly    AZELASTINE-FLUTICASONE 137-50 MCG/ACT SUSPENSION    1 spray by Nasal route 2 (two) times daily    BACLOFEN (LIORESAL) 10 MG TABLET    Take 10 mg by mouth as needed    BUMETANIDE (BUMEX) 2 MG TABLET    Take 1 tablet (2 mg total) by mouth 2 (two) times daily    BUPROPION XL (WELLBUTRIN XL) 150 MG 24 HR TABLET    Take 150 mg by mouth daily    CARVEDILOL (COREG) 25 MG TABLET    Take 25 mg by mouth 2 (two) times daily    CETIRIZINE (ZYRTEC) 5 MG TABLET    Take 5 mg by mouth daily    DICLOFENAC (VOLTAREN) 50 MG EC TABLET    Take 50 mg by mouth daily    DICLOFENAC SODIUM (VOLTAREN) 1 % GEL TOPICAL GEL    Apply 2 g topically 4 (four) times daily    DOCUSATE SODIUM (COLACE) 100 MG CAPSULE     Take 100 mg by mouth nightly    DULERA 200-5 MCG/ACT AEROSOL    Inhale 2 puffs into the lungs daily    DULOXETINE (CYMBALTA) 60 MG CAPSULE    Take 60 mg by mouth daily    EPINEPHRINE 0.15 MG/0.3ML INJECTION    Inject 0.15 mg into the muscle as needed    FOLIC ACID (FOLVITE) 1 MG TABLET    Take 1 mg by mouth daily    GABAPENTIN (NEURONTIN) 800 MG TABLET    Take 800 mg by mouth 3 (three) times daily    HYDRALAZINE (APRESOLINE) 50 MG TABLET    Take 50 mg by mouth 2 (two) times daily    HYDROXYZINE (ATARAX) 10 MG TABLET    Take 10 mg by mouth every 6 (six) hours as needed for Anxiety    INSULIN DETEMIR (LEVEMIR) 100 UNIT/ML INJECTION    Inject 50 Units into the skin every 12 (twelve) hours    INSULIN LISPRO (HUMALOG) 100 UNIT/ML INJECTION    Inject 25 Units into the skin 3 (three) times daily before meals    INSULIN SYRINGE-NEEDLE U-100 27G X 1/2" 1 ML MISC    Inject 1 Dose into the skin 4 (four) times daily    LIDOCAINE (LIDODERM) 5 %    Place 1 patch onto the skin every 24 hours Remove & Discard patch within 12 hours or as directed by MD    MECLIZINE (ANTIVERT) 25 MG TABLET    Take 25 mg by mouth as needed    METOLAZONE (ZAROXOLYN) 10 MG TABLET    Take 10 mg by mouth daily    MONTELUKAST (SINGULAIR) 10 MG TABLET  Take 10 mg by mouth nightly    OLMESARTAN (BENICAR) 40 MG TABLET    Take 40 mg by mouth daily    OMEPRAZOLE (PRILOSEC) 40 MG CAPSULE    Take 40 mg by mouth nightly    SEMAGLUTIDE,0.25 OR 0.5MG /DOS, (OZEMPIC, 0.25 OR 0.5 MG/DOSE,) 2 MG/1.5ML SOLUTION PEN-INJECTOR    Inject into the skin    TOPIRAMATE (TOPAMAX) 100 MG TABLET    Take 100 mg by mouth daily    TRAZODONE (DESYREL) 150 MG TABLET    Take 150 mg by mouth    TRULICITY 1.5 MG/0.5ML SOLUTION PEN-INJECTOR    Inject 1.5 mg into the skin Once each week on Monday    VALACYCLOVIR (VALTREX) 1000 MG TABLET    Take 1 g by mouth as needed         REVIEW OF SYSTEMS   Review of Systems See History of Present Illness  PHYSICAL EXAM     ED Triage Vitals [03/05/22  1103]   Enc Vitals Group      BP 138/78      Heart Rate 81      Resp Rate 16      Temp 98.3 F (36.8 C)      Temp src       SpO2 100 %      Weight 130.6 kg      Height 1.676 m      Head Circumference       Peak Flow       Pain Score 10      Pain Loc       Pain Edu?       Excl. in GC?      Physical Exam  Vitals and nursing note reviewed.   Constitutional:       General: She is not in acute distress.     Appearance: She is obese. She is not toxic-appearing.   HENT:      Head: Normocephalic.      Comments: Several   Eyes:      General:         Right eye: No discharge.      Extraocular Movements: Extraocular movements intact.      Conjunctiva/sclera:      Right eye: Hemorrhage present. No chemosis or exudate.     Comments: Right eye lid swollen without erythema    Neurological:      Mental Status: She is alert.          MEDICAL DECISION MAKING     PRIMARY PROBLEM LIST      Acute illness/injury with risk to life or bodily function (based on differential diagnosis or evaluation) DIAGNOSIS:right eyelid swelling, subconjunctival hemorrhage, scalp pustules  Chronic Illness Impacting Care of the above problem: Diabetes Increases complexity of evaluation, Increases the risk of severe disease, Increases the risk of poor wound healing, and Limits treatment options  {Differential Diagnosis:59053}ddx: allergic reaction, post-procedure swelling, foreign body, chalazion, blepharitis, periorbital cellulitis   DISCUSSION      41 y/o female with PMH poorly controlled DM, HTN, CKD, depression, retinopathy, HFpEF    {If patient is being hospitalized is severe sepsis or septic shock suspected?:59467}      {Was management discussed with a consultant?:59037}  {Was the decision around the need for surgery discussed with consultant:59056::"N/A"}  {External Records Reviewed?:59023}    Additional Notes    {Diagnostic test considered and not performed:59031::"N/A"}  {Prescription medications considered and not  given:59033::"N/A"}  {Hospitalization considered but not  done:59032::"N/A"}  {Social Determinants of Health Considerations:59036::"N/A"}  {Was there decision to not resuscitate or to de-escalate care due to poor prognosis?:59057}         Vital Signs: Reviewed the patient's vital signs.   Nursing Notes: Reviewed and utilized available nursing notes.  Medical Records Reviewed: Reviewed available past medical records.  Counseling: The emergency provider has spoken with the patient and discussed today's findings, in addition to providing specific details for the plan of care.  Questions are answered and there is agreement with the plan.      MIPS DOCUMENTATION    {ORAL ANTIBIOTICS given for otitis externa due to:59079}  {ACUTE BRONCHITIS Medical reasoning for giving this patient oral antibiotics for acute bronchitis are the following (Optional):59080}  {Medical reasoning for giving this patient oral antibiotics for acute sinusitis are the following (Optional):59081}  {HEAD TRAUMA Indications for head CT due to trauma (Optional):59082}    CARDIAC STUDIES    The following cardiac studies were independently interpreted by me the Emergency Medicine Provider.  For full cardiac study results please see chart.    {Monitor Strip Interpretation:59688}  {ZOXW:96045}  {Rhythm:59687}  {ST segments:59689}    {EKG interpretation:59684}  {Comparison:59859}  {Rate:59685}  {Rhythm:59687}  {ST segments:59689}  {EKG interpretation:59690}    {EKG interpretation:59684}  {Comparison:59859}  {Rate:59685}  {Rhythm:59687}  {ST segments:59689}  {EKG interpretation:59690}  EMERGENCY IMAGING STUDIES    The following imagine studies were independently interpreted by me (emergency medicine provider):    {Xray interpreted by ED provider? (Optional):59468} {SIDE (Optional):59475}  {Comparison:59859}  {RESULT:59469}  {IMPRESSION:59470}    {CT interpreted by provider? (Optional):59471}  {Comparison:59859}  {RESULT:59473}  {IMPRESSION:59474}  RADIOLOGY  IMAGING STUDIES      No orders to display       EMERGENCY DEPT. MEDICATIONS      ED Medication Orders (From admission, onward)      None            LABORATORY RESULTS    Ordered and independently interpreted AVAILABLE laboratory tests.   Results       ** No results found for the last 24 hours. **              CRITICAL CARE/PROCEDURES    Procedures  ***Critical care?  DIAGNOSIS      Diagnosis:  Final diagnoses:   None       Disposition:  ED Disposition       None            Prescriptions:  Patient's Medications   New Prescriptions    No medications on file   Previous Medications    ALBUTEROL SULFATE HFA (PROVENTIL) 108 (90 BASE) MCG/ACT INHALER    Inhale 2 puffs into the lungs every 4 (four) hours as needed for Wheezing    ALCOHOL SWABS PADS    Use as indicated for insulin injections    APIXABAN (ELIQUIS) 5 MG    Take 5 mg by mouth every 12 (twelve) hours    ARIPIPRAZOLE (ABILIFY) 5 MG TABLET    Take 10 mg by mouth daily    ASPIRIN 81 MG CHEWABLE TABLET    Chew 81 mg by mouth daily    ATORVASTATIN (LIPITOR) 40 MG TABLET    Take 80 mg by mouth nightly    AZELASTINE-FLUTICASONE 137-50 MCG/ACT SUSPENSION    1 spray by Nasal route 2 (two) times daily    BACLOFEN (LIORESAL) 10 MG TABLET    Take 10 mg by mouth as needed  BUMETANIDE (BUMEX) 2 MG TABLET    Take 1 tablet (2 mg total) by mouth 2 (two) times daily    BUPROPION XL (WELLBUTRIN XL) 150 MG 24 HR TABLET    Take 150 mg by mouth daily    CARVEDILOL (COREG) 25 MG TABLET    Take 25 mg by mouth 2 (two) times daily    CETIRIZINE (ZYRTEC) 5 MG TABLET    Take 5 mg by mouth daily    DICLOFENAC (VOLTAREN) 50 MG EC TABLET    Take 50 mg by mouth daily    DICLOFENAC SODIUM (VOLTAREN) 1 % GEL TOPICAL GEL    Apply 2 g topically 4 (four) times daily    DOCUSATE SODIUM (COLACE) 100 MG CAPSULE    Take 100 mg by mouth nightly    DULERA 200-5 MCG/ACT AEROSOL    Inhale 2 puffs into the lungs daily    DULOXETINE (CYMBALTA) 60 MG CAPSULE    Take 60 mg by mouth daily    EPINEPHRINE 0.15  MG/0.3ML INJECTION    Inject 0.15 mg into the muscle as needed    FOLIC ACID (FOLVITE) 1 MG TABLET    Take 1 mg by mouth daily    GABAPENTIN (NEURONTIN) 800 MG TABLET    Take 800 mg by mouth 3 (three) times daily    HYDRALAZINE (APRESOLINE) 50 MG TABLET    Take 50 mg by mouth 2 (two) times daily    HYDROXYZINE (ATARAX) 10 MG TABLET    Take 10 mg by mouth every 6 (six) hours as needed for Anxiety    INSULIN DETEMIR (LEVEMIR) 100 UNIT/ML INJECTION    Inject 50 Units into the skin every 12 (twelve) hours    INSULIN LISPRO (HUMALOG) 100 UNIT/ML INJECTION    Inject 25 Units into the skin 3 (three) times daily before meals    INSULIN SYRINGE-NEEDLE U-100 27G X 1/2" 1 ML MISC    Inject 1 Dose into the skin 4 (four) times daily    LIDOCAINE (LIDODERM) 5 %    Place 1 patch onto the skin every 24 hours Remove & Discard patch within 12 hours or as directed by MD    MECLIZINE (ANTIVERT) 25 MG TABLET    Take 25 mg by mouth as needed    METOLAZONE (ZAROXOLYN) 10 MG TABLET    Take 10 mg by mouth daily    MONTELUKAST (SINGULAIR) 10 MG TABLET    Take 10 mg by mouth nightly    OLMESARTAN (BENICAR) 40 MG TABLET    Take 40 mg by mouth daily    OMEPRAZOLE (PRILOSEC) 40 MG CAPSULE    Take 40 mg by mouth nightly    SEMAGLUTIDE,0.25 OR 0.5MG /DOS, (OZEMPIC, 0.25 OR 0.5 MG/DOSE,) 2 MG/1.5ML SOLUTION PEN-INJECTOR    Inject into the skin    TOPIRAMATE (TOPAMAX) 100 MG TABLET    Take 100 mg by mouth daily    TRAZODONE (DESYREL) 150 MG TABLET    Take 150 mg by mouth    TRULICITY 1.5 MG/0.5ML SOLUTION PEN-INJECTOR    Inject 1.5 mg into the skin Once each week on Monday    VALACYCLOVIR (VALTREX) 1000 MG TABLET    Take 1 g by mouth as needed   Modified Medications    No medications on file   Discontinued Medications    No medications on file           This note was generated by the Epic EMR system/ Dragon speech recognition and may  contain inherent errors or omissions not intended by the user. Grammatical errors, random word insertions, deletions  and pronoun errors  are occasional consequences of this technology due to software limitations. Not all errors are caught or corrected. If there are questions or concerns about the content of this note or information contained within the body of this dictation they should be addressed directly with the author for clarification.

## 2022-03-05 NOTE — EDIE (Signed)
COLLECTIVE?NOTIFICATION?03/05/2022 10:49?[name], [name]?MRN: [id number]    Criteria Met      3 Different Facilities in 90 Days    5 ED Visits in 12 Months    Security and Safety  No Security Events were found.  ED Care Guidelines  There are currently no ED Care Guidelines for this patient. Please check your facility's medical records system.    Flags      Negative COVID-19 Lab Result - [facility] - A specimen collected from this patient was negative for COVID-19 / Attributed By: [place] [facility] / Attributed On: 02/04/2022       Prescription Drug Data  No Prescription Drug Data was found.    E.D. Visit Count (12 mo.)  Facility Visits   [facility]   [facility] - [facility] 1   [place] [facility] 6   [place] [facility] 1   [place] [facility] 1   [place] [facility] 1   Total 11   Note: Visits indicate total known visits.     Recent Emergency Department Visit Summary  Showing 10 most recent visits out of 11 in the past 12 months   Date Facility [facility] Type Diagnoses or Chief Complaint    Mar 05, 2022  [place] [facility]: [facility]  [place]  Emergency      Swollen Eye      Feb 24, 2022  [facility] [place] [facility]  [place]  [place]  Emergency      Possible blod clot in habd- kidney patient      Blood Clot Rule Out      Other specified soft tissue disorders      Pain in right arm      Ganglion, right hand      Feb 02, 2022  [place] [facility] [place] [facility]  [place]  [place]  Emergency      Neck, back , Flank pain; Fever; leg pain; Hx of Blood clot      Otalgia      Knee Pain      Back Pain      Acute kidney failure, unspecified      Dec 17, 2021  [place] [facility] [place] [facility]  [place]  [place]  Emergency      wound check post surgery      Post-op Problem      Wrist Pain      Pain in right arm      Other specified symptoms and signs involving the circulatory and respiratory systems      Encounter for other specified surgical aftercare      Oct 05, 2021  [place] [facility] [place] [facility]  [place]   [place]  Emergency      triage      triage - cp, sob, edema      Shortness of Breath      Chest Pain      Viral infection, unspecified      Disorder of kidney and ureter, unspecified      Essential (primary) hypertension      Oct 01, 2021  [place] [facility] [place] [facility]  [place]  [place]  Emergency      Pinched Nerve Right side/Stiff      Arm Injury      Unspecified fall, initial encounter      Strain of unspecified muscle, fascia and tendon at wrist and hand level, right hand, initial encounter  Lesion of ulnar nerve, right upper limb      Aug 16, 2021  [facility] - Visits  [place]  [place]  [facility]gent Care      Localized edema      Other forms of dyspnea      Jun 29, 2021  [place] [facility] [place] [facility]  [place]  [place]  Emergency      body aches, vomiting, diarrhea      Generalized Body Aches      Diarrhea      Emesis      Dizziness      Headache      COVID-19      Disorder of kidney and ureter, unspecified      Anemia, unspecified      Apr 29, 2021  [place] [facility] [place] [facility]  [place]  [place]  Emergency      Syncope      Syncope and collapse      Dizziness and giddiness      Abnormal finding of blood chemistry, unspecified      Other specified abnormal findings of blood chemistry      Apr 24, 2021  [facility]  [place]  Emergency      1. Syncope and collapse      2. 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 Heart failure, unspecified      4. Type 1 diabetes mellitus with diabetic chronic kidney disease      5. Long term (current) use of insulin      6. Long term (current) use of anticoagulants      7. Contact with and (suspected) exposure to COVID-19      8. Personal history of pulmonary embolism      9. Other specified postprocedural states      10. Allergy status to other antibiotic agents        Recent Inpatient Visit Summary  Date Facility [facility] Type Diagnoses or Chief Complaint    Feb 02, 2022  [facility] [place]  [facility]  [place]  [place]  Medical Surgical      Acute kidney failure, unspecified        Care Team  Provider Specialty Phone Fax Service Dates   [facility] Clinic/Center: [facility]) [phone] Current    Ricard Dillon , MD Internal Medicine [phone] Current      Collective Portal  This patient has registered at the [facility] Emergency Room: [facility] Emergency Department   For more information visit: [email or web address]     PLEASE NOTE:     1.   Any care recommendations and other clinical information are provided as guidelines or for historical purposes only, and providers should exercise their own clinical judgment when providing care.    2.   You may only use this information for purposes of treatment, payment or health care operations activities, and subject to the limitations of applicable Collective Policies.    3.   You should consult directly with the organization that provided a care guideline or other clinical history with any questions about additional information or accuracy or completeness of information provided.    ? 2023 [place] - [email or web address]

## 2022-03-05 NOTE — Discharge Instructions (Signed)
You were seen in the ER today for right eyelid swelling and boils on your scalp.  It is imperative that you see your eye doctor today to evaluate for a vision threatening inflammation in the back of your eye.  Please proceed straight to their [place] office.  For the boils on your scalp, please take doxycycline twice a day for the next week and follow-up with a dermatologist.  You may apply warm compresses to the area to help the boils come to ahead.  Apply antibiotic ointment such as bacitracin to any open lesions.  Return to the ER if you experience fever, increased redness or swelling of your skin, drainage from your eye, chest pain, shortness of breath, swelling in your mouth or tongue, or any new or concerning symptoms.

## 2022-04-01 ENCOUNTER — Encounter (INDEPENDENT_AMBULATORY_CARE_PROVIDER_SITE_OTHER): Payer: BC Managed Care – PPO | Admitting: Foot & Ankle Surgery

## 2022-04-01 NOTE — Progress Notes (Deleted)
Podiatric Surgery Patient Visit    Patient Name: [name], DIAL  Assessment:   -Patient is 41 y.o. female with ***      Plan:   -***  -F/U ***        Subjective:     Chief Complaint: No chief complaint on file.      HPI:  [name] is a 41 y.o. female. ***      Denies N/V/F/C/SOB/CP  All other systems were reviewed and are negative  The following portions of the patient's history were reviewed and updated as appropriate: allergies, current medications, past medical history, past surgical history, family history, and problem list.    Physical Exam:   There were no vitals filed for this visit.    Gen: AAOx3, NAD, Well developed  HEENT: Normocephalic, EOMI  Heart: Regular pulse rate and rhythm  Vascular:  Palpable pedal pulses b/l, CFT to all toes <3 sec. No edema. No varicosities  Derm:  Skin intact without wounds or rashes.  No erythema or ecchymosis.  MSK:   MMS 5/5 and symmetric, No other areas of pain or tenderness. No gross deformity. ROM normal without crepitus.  Neuro:  SILT. Neg tinel's sign.  Able to wiggle toes. No motor deficits.     Standing and gait exam: Gait is nonantalgic.  Normal gait.***      Radiology:     Xrays taken in clinic today: ***      Foot Left AP Lateral And Oblique [[id number]] (Order [id number])  Status: Final result     Study Result    Narrative & Impression   Clinical History:  known jones fracture (June 2021) w/ increased pain/swelling      Examination:  XR FOOT LEFT AP LATERAL AND OBLIQUE     Comparison:  None available.     IMPRESSION:         Subacute appearing transverse fracture involving the proximal fifth  metatarsal. There is a small callus formation on the lateral view but  persistent lucency at the fracture site measuring up to 88mm, suspicious  for nonunion.     Acute/recent appearing minimally displaced transverse fracture of the  third metatarsal base, not associated with significant callus formation.        [name], MD   10/21/2020 9:35 PM     [name], DPM FACFAS  [facility] Podiatric Surgery  Pager [phone]    ------------------------------------------------------------------------------------------------------------------------------  The review of the patient's medications does not in any way constitute an endorsement, by this clinician,  of their use, dosage, indications, route, efficacy, interactions, or other clinical parameters.    This note was generated within the EPIC EMR using Dragon medical speech recognition software and may contain inherent errors or omissions not intended by the user. Grammatical and punctuation errors, random word insertions, deletions, pronoun errors and incomplete sentences are occasional consequences of this technology due to software limitations. Not all errors are caught or corrected.  Although every attempt is made to root out erroneus and incomplete transcription, the note may still not fully represent the intent or opinion of the author. If there are questions or concerns about the content of this note or information contained within the body of this dictation they should be addressed directly with the author for clarification.

## 2022-04-02 ENCOUNTER — Emergency Department
Admission: EM | Admit: 2022-04-02 | Discharge: 2022-04-02 | Disposition: A | Discharge status: Home / Self Care | Payer: BC Managed Care – PPO | Attending: Emergency Medicine | Admitting: Emergency Medicine

## 2022-04-02 ENCOUNTER — Emergency Department: Payer: BC Managed Care – PPO

## 2022-04-02 DIAGNOSIS — M79605 Pain in left leg: Secondary | ICD-10-CM | POA: Insufficient documentation

## 2022-04-02 NOTE — ED Triage Notes (Signed)
[name] is a 41 y.o. female c/o left leg pain that began yesterday morning. Pt has a hx of DVT and PE and is currently taking Eliquis and is compliant with her medications. Pt denies any chest pain or shortness of breath.

## 2022-04-02 NOTE — Discharge Instructions (Signed)
Please return to the ED if your symptoms are worsening

## 2022-04-02 NOTE — EDIE (Signed)
COLLECTIVE?NOTIFICATION?04/02/2022 15:14?Currie ParisVAUGHAN, Lynn T?MRN: [id number]    Criteria Met      3 Different Facilities in 90 Days    5 ED Visits in 12 Months    Security and Safety  No Security Events were found.  ED Care Guidelines  There are currently no ED Care Guidelines for this patient. Please check your facility's medical records system.        Prescription Monitoring Program  140??- Narcotic Use Score  070??- Sedative Use Score  000??- Stimulant Use Score  230??- Overdose Risk Score  - All Scores range from 000-999 with 75% of the population scoring < 200 and on 1% scoring above 650  - The last digit of the narcotic, sedative, and stimulant score indicates the number of active prescriptions of that type  - Higher Use scores correlate with increased prescribers, pharmacies, mg equiv, and overlapping prescriptions  - Higher Overdose Risk Scores correlate with increased risk of unintentional overdose death   Concerning or unexpectedly high scores should prompt a review of the PMP record; this does not constitute checking PMP for prescribing purposes.    E.D. Visit Count (12 mo.)  Facility Visits   [facility]   [facility]   [facility] 1   [place] [facility] 6   [place] [facility] 1   [place] [facility]   [facility] 1   Total 12   Note: Visits indicate total known visits.     Recent Emergency Department Visit Summary  Showing 10 most recent visits out of 12 in the past 12 months   [facility] Type Diagnoses or Chief Complaint    Apr 02, 2022  [place] [facility] [place] [facility]  [place]  Emergency      Triage: Left leg pain/Possible blood clot      Mar 05, 2022  [facility]  [place]  [address]  Emergency  Chief Complaint: EMs: Near syncope    Mar 05, 2022  [place] [facility]  [place]  [place]  Emergency      Swollen Eye      Facial Swelling      Furuncle of  head [any part, except face]      Edema of right eye, unspecified eyelid      Conjunctival hemorrhage, right eye      Feb 24, 2022  [place] [facility] [place] [facility]  [place]  [place]  Emergency      Possible blod clot in habd- kidney patient      Blood Clot Rule Out      Other specified soft tissue disorders      Pain in right arm      Ganglion, right hand      Feb 02, 2022  [place] [facility]  [place]  Emergency      Neck, back , Flank pain; Fever; leg pain; Hx of Blood clot      Otalgia      Knee Pain      Back Pain      Acute kidney failure, unspecified      Dec 17, 2021  [place] [facility] [place] [facility]  [place]  [place]  Emergency      wound check post surgery      Post-op Problem      Wrist Pain      Pain  in right arm      Other specified symptoms and signs involving the circulatory and respiratory systems      Encounter for other specified surgical aftercare      Oct 05, 2021  [place] [facility] [place] [facility]  [place]  [place]  Emergency      triage      triage - cp, sob, edema      Shortness of Breath      Chest Pain      Viral infection, unspecified      Disorder of kidney and ureter, unspecified      Essential (primary) hypertension      Oct 01, 2021  [place] [facility] [place] [facility]  [place]  [place]  Emergency      Pinched Nerve Right side/Stiff      Arm Injury      Unspecified fall, initial encounter      Strain of unspecified muscle, fascia and tendon at wrist and hand level, right hand, initial encounter      Lesion of ulnar nerve, right upper limb      Aug 16, 2021  [facility] [place]its  [place]  [place]  Urgent Care      Localized edema      Other forms of dyspnea      Jun 29, 2021  [place] [facility] [place] [facility]  [place]  [place]  Emergency      body aches, vomiting, diarrhea      Generalized Body Aches      Diarrhea      Emesis      Dizziness      Headache      COVID-19      Disorder of kidney and ureter, unspecified      Anemia, unspecified        Recent Inpatient Visit Summary  Date Facility [facility] Type Diagnoses or Chief Complaint    Feb 02, 2022  [facility] [place] [facility]  [place]   [place]  Medical Surgical      Acute kidney failure, unspecified        Care Team  Provider Specialty Phone Fax Service Dates   [facility] Clinic/Center: [facility]) [phone] Current    Ricard Dillon , MD Internal Medicine [phone] Current      Collective Portal  This patient has registered at the [facility]   For more information visit: [email or web address]     PLEASE NOTE:     1.   Any care recommendations and other clinical information are provided as guidelines or for historical purposes only, and providers should exercise their own clinical judgment when providing care.    2.   You may only use this information for purposes of treatment, payment or health care operations activities, and subject to the limitations of applicable Collective Policies.    3.   You should consult directly with the organization that provided a care guideline or other clinical history with any questions about additional information or accuracy or completeness of information provided.    ? 2023 [place] - [email or web address]

## 2022-04-02 NOTE — ED Triage Notes (Signed)
[facility] EMERGENCY DEPARTMENT Provider in Triage Note        Patient Name: [name]    Chief Complaint: No chief complaint on file.      HPI: [name] is a 41 y.o. female, who has had a rapid medical screening evaluation initiated by myself for the chief complaint of  leg pain  hx dvt  is on blood thinner , Lt leg pain that started yesterday 3am  no sob, chest       ROS: Please see HPI.     Medical/Surgical/Social history: as per HPI    Vitals: There were no vitals taken for this visit.    Pertinent brief exam:   Constitutional: No acute distress.Well-nourished.  Cardiovascular: Normal heart rate.   Pulmonary: Normal effort.  No respiratory distress. No stridor.  Skin: Normal skin color. No diaphoresis.  Psych: Normal affect.     Additional pertinent physical exam findings: leg pain    Preliminary orders: [place]     Symptom based diagnosis: dvt      Patient advised to remain in the ED until further evaluation can be performed. Patient instructed to notify staff of any changes in condition while waiting.    This assessment is an initial evaluation to expedite care.

## 2022-04-02 NOTE — ED Provider Notes (Signed)
EMERGENCY DEPARTMENT HISTORY AND PHYSICAL EXAM     None        Date: 04/02/2022  Patient Name: [name]    History of Presenting Illness     Chief Complaint   Patient presents with    Leg Pain       History Provided By: patient    History: [name] is a 41 y.o. 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  Patient states that she has been experiencing left leg pain upon ambulating for past few days.  Patient states pain is sharp and throbbing, 10 out of 10.  Patient is concerned she may have DVT came to emergency department for further evaluation.  Patient states she is compliant with her Eliquis.  Patient denies any associated numbness and tingling in her extremity, any trauma, redness, swelling, fevers and chills.  Patient denies any prolonged travel, any recent surgeries.  Patient denies any shortness of breath, chest pain, lightheadedness.      PCP: Louann Sjogren, NP  SPECIALISTS:    No current facility-administered medications for this encounter.     Current Outpatient Medications   Medication Sig Dispense Refill    albuterol sulfate HFA (PROVENTIL) 108 (90 Base) MCG/ACT inhaler Inhale 2 puffs into the lungs every 4 (four) hours as needed for Wheezing 1 each 0    Alcohol Swabs Pads Use as indicated for insulin injections 120 each 3    apixaban (ELIQUIS) 5 MG Take 5 mg by mouth every 12 (twelve) hours      ARIPiprazole (ABILIFY) 5 MG tablet Take 10 mg by mouth daily      aspirin 81 MG chewable tablet Chew 81 mg by mouth daily      atorvastatin (LIPITOR) 40 MG tablet Take 80 mg by mouth nightly      Azelastine-Fluticasone 137-50 MCG/ACT Suspension 1 spray by Nasal route 2 (two) times daily      baclofen (LIORESAL) 10 MG tablet Take 10 mg by mouth as needed      bumetanide (BUMEX) 2 MG tablet Take 1 tablet (2 mg total) by mouth 2 (two) times daily 60 tablet 0    buPROPion XL (WELLBUTRIN XL) 150 MG 24 hr tablet Take 150 mg by mouth daily       carvedilol (COREG) 25 MG tablet Take 25 mg by mouth 2 (two) times daily      cetirizine (ZyrTEC) 5 MG tablet Take 5 mg by mouth daily      diclofenac (VOLTAREN) 50 MG EC tablet Take 50 mg by mouth daily      diclofenac Sodium (VOLTAREN) 1 % Gel topical gel Apply 2 g topically 4 (four) times daily 50 g 0    docusate sodium (COLACE) 100 MG capsule Take 100 mg by mouth nightly      Dulera 200-5 MCG/ACT Aerosol Inhale 2 puffs into the lungs daily      DULoxetine (CYMBALTA) 60 MG capsule Take 60 mg by mouth daily      EPINEPHrine 0.15 MG/0.3ML injection Inject 0.15 mg into the muscle as needed      folic acid (FOLVITE) 1 MG tablet Take 1 mg by mouth daily      gabapentin (NEURONTIN) 800 MG tablet Take 800 mg by mouth 3 (three) times daily      hydrALAZINE (APRESOLINE) 50 MG tablet Take 50 mg by mouth 2 (two) times daily      hydrOXYzine (ATARAX) 10  MG tablet Take 10 mg by mouth every 6 (six) hours as needed for Anxiety      insulin detemir (Levemir) 100 UNIT/ML injection Inject 50 Units into the skin every 12 (twelve) hours (Patient taking differently: Inject 53 Units into the skin every 12 (twelve) hours) 30 mL 1    insulin lispro (HumaLOG) 100 UNIT/ML injection Inject 25 Units into the skin 3 (three) times daily before meals (Patient taking differently: Inject 25 Units into the skin 3 (three) times daily before meals) 30 mL 3    Insulin Syringe-Needle U-100 27G X 1/2" 1 ML Misc Inject 1 Dose into the skin 4 (four) times daily 500 each 3    lidocaine (LIDODERM) 5 % Place 1 patch onto the skin every 24 hours Remove & Discard patch within 12 hours or as directed by MD 15 patch 0    meclizine (ANTIVERT) 25 MG tablet Take 25 mg by mouth as needed      metOLazone (ZAROXOLYN) 10 MG tablet Take 10 mg by mouth daily      montelukast (SINGULAIR) 10 MG tablet Take 10 mg by mouth nightly      olmesartan (BENICAR) 40 MG tablet Take 40 mg by mouth daily      omeprazole (PriLOSEC) 40 MG capsule Take 40 mg by mouth nightly       Semaglutide,0.25 or 0.5MG /DOS, (Ozempic, 0.25 or 0.5 MG/DOSE,) 2 MG/1.5ML Solution Pen-injector Inject into the skin      topiramate (TOPAMAX) 100 MG tablet Take 100 mg by mouth daily      traZODone (DESYREL) 150 MG tablet Take 150 mg by mouth      Trulicity 1.5 MG/0.5ML Solution Pen-injector Inject 1.5 mg into the skin Once each week on Monday      valACYclovir (VALTREX) 1000 MG tablet Take 1 g by mouth as needed         Past History     Past Medical History:  Past Medical History:   Diagnosis Date    Anemia     Arthritis     Asthma     Chronic kidney disease     Stage III    Congestive heart failure     Diabetes mellitus     Fibromyalgia     Gastroesophageal reflux disease     Hyperlipidemia     Hypertension     Neuropathy     Pulmonary embolism 08/2019    Renal insufficiency     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     Scoliosis     Sleep apnea     no cpap    Type 2 diabetes mellitus, controlled     Vertigo        Past Surgical History:  Past Surgical History:   Procedure Laterality Date    ABLATION ENDOMETRIAL      APPENDECTOMY (OPEN)      CESAREAN SECTION      x 2    CHOLECYSTECTOMY      EYE SURGERY Bilateral     cataract    HYSTERECTOMY  12/2019    REPAIR, UPPER EXTREMITY, TENDON & NERVE Right 12/05/2021    Procedure: REPAIR, RIGHT ULNAR NERVE AND NEUROPLASTY @ ELBOW AND WRIST, RIGHT OPEN CARPAL TUNNEL RELEASE;  Surgeon: Cathrine Muster, MD;  Location: [facility];  Service: Plastics;  Laterality: Right;    UTERINE RUPTURE         Family History:  Family History   Problem Relation Age of Onset  Heart disease Mother     Hypertension Mother     Diabetes Mother     Asthma Father     Hypertension Father     Diabetes Daughter     Asthma Son     Cancer Maternal Aunt     Cancer Maternal Grandmother        Social History:  Social History     Tobacco Use    Smoking status: Never    Smokeless tobacco: Never   Vaping Use    Vaping status: Never Used   Substance Use Topics    Alcohol use: Never    Drug use: Never        Allergies:  Allergies   Allergen Reactions    Bactrim [Sulfamethoxazole-Trimethoprim] Hives, Respiratory Distress and Edema    Glipizide Edema     Throat swelling    Lisinopril Edema     Tongue swelling       Review of Systems     Review of Systems: All pertinent systems reviewed and negative.         Physical Exam   BP 148/80   Pulse 98   Temp 98.1 F (36.7 C) (Oral)   Resp 17   Wt 129.3 kg   SpO2 100%   BMI 46.00 kg/m     Constitutional: Vital signs reviewed. Well appearing. No distress.  Head: Normocephalic, atraumatic  Eyes: Conjunctiva and sclera are normal.  No injection or discharge.  Ears, Nose, Throat:  Normal external examination of the nose and ears. No throat or oropharyngeal swelling or erythema. Midline uvula. Mucous membranes moist.  Neck: Normal range of motion. Supple, no meningeal signs. Trachea midline. No stridor. No JVD  Respiratory/Chest: Clear to auscultation. No respiratory distress.   Cardiovascular: Regular rate and rhythm. No murmurs.  Abdomen:  Bowel sounds intact. No rebound or guarding. Soft.  Non-tender.  Back: No CVA tenderness to percussion. No focal tenderness.  Upper Extremity:  No edema. No cyanosis. Bilateral radial pulses intact and equal.   Lower Extremity:  No edema. No cyanosis. Bilateral calves symmetrical and non-tender. Bilateral femoral, DP, PT pulses intact and equal.  Skin: Warm and dry. No rash.  Neuro: Cranial nerves grossly intact.  Moves all extremities spontaneously.  Psychiatric: Normal affect.  Normal insight.      Diagnostic Study Results     Labs -     Results       ** No results found for the last 24 hours. **            Radiologic Studies -   Radiology Results (24 Hour)       Procedure Component Value Units Date/Time    [place] VenoDopp Low Extremity Left [[id number]] Collected: 04/02/22 1703    Order Status: Completed Updated: 04/02/22 1707    Narrative:      US VENOUS LOWER EXTREMITY DUPLEX DOPPLER UNILATERAL LEFT    HISTORY: Leg DVT  suspected.    COMPARISON: Left leg Doppler ultrasound from 02/02/2022.    TECHNIQUE: Grayscale sonographic images of the left leg veins were obtained at baseline and during compression. Doppler images of the leg veins were obtained.      FINDINGS:    The common femoral, superficial femoral, popliteal, posterior tibial, and peroneal veins appear compressible and patent by Doppler. The external iliac, profunda femoral, and anterior tibial veins appear patent by Doppler.    Waveforms show expected respiratory/cardiac variation.    No intraluminal thrombus identified.  Impression:        No evidence of DVT.    Christy Gentles MD, MD  04/02/2022 5:05 PM        .    Medical Decision Making   I am the first provider for this patient.    I reviewed the vital signs, available nursing notes, medical records, past medical history, past surgical history, family history and social history.    Vital Signs-Reviewed the patient's vital signs.   Patient Vitals for the past 12 hrs:   BP Temp Pulse Resp   04/02/22 1630 148/80 98.1 F (36.7 C) 98 17   04/02/22 1523 (!) 139/96 98.6 F (37 C) 93 18           ED Course:   Patient seen and examined at the bedside.  Prior medical records reviewed.  This is 41 year old female with history of DVT complaining of left leg pain.  Ultrasound Doppler ordered for rule out DVT which is negative for any DVTs.  Patient's leg has no swelling, no erythema, no calf tenderness.  Patient's pain likely musculoskeletal in origin.  Patient was educated on imaging and clinical diagnosis.  Patient was recommended to follow-up with primary care doctor within 1 week.  Patient was given strict return precautions.  Patient demonstrated verbal understanding and agreed with plan to be discharged home.      Diagnosis     Clinical Impression:   1. Left leg pain        Treatment Plan:   ED Disposition       ED Disposition   Discharge    Condition   --    Date/Time   Wed Apr 02, 2022  5:18 PM    Comment   Adeja Sarratt discharge to home/self care.    Condition at disposition: Stable

## 2022-05-01 ENCOUNTER — Emergency Department: Payer: BC Managed Care – PPO

## 2022-05-01 ENCOUNTER — Emergency Department
Admission: EM | Admit: 2022-05-01 | Discharge: 2022-05-01 | Disposition: A | Discharge status: Home / Self Care | Payer: BC Managed Care – PPO | Attending: Emergency Medicine | Admitting: Emergency Medicine

## 2022-05-01 DIAGNOSIS — S46911A Strain of unspecified muscle, fascia and tendon at shoulder and upper arm level, right arm, initial encounter: Secondary | ICD-10-CM | POA: Insufficient documentation

## 2022-05-01 DIAGNOSIS — Y92009 Unspecified place in unspecified non-institutional (private) residence as the place of occurrence of the external cause: Secondary | ICD-10-CM | POA: Insufficient documentation

## 2022-05-01 DIAGNOSIS — W010XXA Fall on same level from slipping, tripping and stumbling without subsequent striking against object, initial encounter: Secondary | ICD-10-CM | POA: Insufficient documentation

## 2022-05-01 MED ORDER — MORPHINE SULFATE 4 MG/ML IJ/IV SOLN (WRAP)
4.0000 mg | Freq: Once | Status: AC
Start: 2022-05-01 — End: 2022-05-01
  Administered 2022-05-01: 4 mg via INTRAMUSCULAR
  Filled 2022-05-01: qty 1

## 2022-05-01 MED ORDER — HYDROCODONE-ACETAMINOPHEN 5-325 MG PO TABS
1.0000 | ORAL_TABLET | Freq: Four times a day (QID) | ORAL | 0 refills | Status: AC | PRN
Start: 2022-05-01 — End: 2022-05-08

## 2022-05-01 NOTE — ED Triage Notes (Signed)
[name] is a 41 y.o. female arrives to ED c/o R upper arm pain, medial aspect x1week, limited ROM @ R shoulder joint. PT denies any injury or trauma. Hx of DVT in leg a couple months ago and PE 1.5-44yrs ago. Pt takes eliquis 5mg  BID, recent travel to 1week ago. PT denies any CP or SOB, aaox4

## 2022-05-01 NOTE — EDIE (Signed)
[facility]?NOTIFICATION?05/01/2022 12:49?RAYME, BUI T?MRN: [id number]    Criteria Met      3 Different Facilities in 90 Days    5 ED Visits in 12 Months    Security and Safety  No Security Events were found.  ED Care Guidelines  There are currently no ED Care Guidelines for this patient. Please check your facility's medical records system.        Prescription Monitoring Program  140??- Narcotic Use Score  070??- Sedative Use Score  000??- Stimulant Use Score  230??- Overdose Risk Score  - All Scores range from 000-999 with 75% of the population scoring < 200 and on 1% scoring above 650  - The last digit of the narcotic, sedative, and stimulant score indicates the number of active prescriptions of that type  - Higher Use scores correlate with increased prescribers, pharmacies, mg equiv, and overlapping prescriptions  - Higher Overdose Risk Scores correlate with increased risk of unintentional overdose death   Concerning or unexpectedly high scores should prompt a review of the PMP record; this does not constitute checking PMP for prescribing purposes.    E.D. Visit Count (12 mo.)  Facility Visits   [facility] 1   [facility] 1   [place] [facility] 6   [facility] 1   [place] [facility] 1   [place] [facility] 1   Total 11   Note: Visits indicate total known visits.     Recent Emergency Department Visit Summary  Showing 10 most recent visits out of 11 in the past 12 months   Date Facility [facility] Type Diagnoses or Chief Complaint    May 01, 2022  [place] [facility] [place] [facility]  [place]  [place]  Emergency      Triage: Pain in r arm      Apr 02, 2022  [place] [facility]  [place]  [place]  Emergency      Triage: Left leg pain/Possible blood clot      Leg Pain      Pain in left leg      Mar 05, 2022  [facility]  [place]  [place]  Emergency  Chief Complaint: EMs: Near syncope    Mar 05, 2022  [place] [facility]: [facility] [employer]  [place]  Emergency      Swollen Eye      Facial Swelling      Furuncle of head [any part, except face]      Edema of right eye, unspecified eyelid      Conjunctival hemorrhage, right eye      Feb 24, 2022   [place] [facility]  [place]  [place]  Emergency      Possible blod clot in habd- kidney patient      Blood Clot Rule Out      Other specified soft tissue disorders      Pain in right arm      Ganglion, right hand      Feb 02, 2022   [place] [facility]  [place]  [place]  Emergency      Neck, back , Flank pain; Fever; leg pain; Hx of Blood clot      Otalgia      Knee Pain      Back Pain      Acute kidney failure, unspecified      Dec 17, 2021  [place] [facility]  [place]  [place]  Emergency      wound check post surgery      Post-op Problem      Wrist Pain      Pain in right arm      Other specified symptoms and signs involving the circulatory and respiratory systems      Encounter for other specified surgical aftercare      Oct 05, 2021  [place] [facility] [place] [facility]  [place]  [place]  Emergency      triage      triage - cp, sob, edema      Shortness of Breath      Chest Pain      Viral infection, unspecified      Disorder of kidney and ureter, unspecified      Essential (primary) hypertension      Oct 01, 2021  [place] [facility]  [place]  [place]  Emergency      Pinched Nerve Right side/Stiff      Arm Injury      Unspecified fall, initial encounter      Strain of unspecified muscle, fascia and tendon at wrist and hand level, right hand, initial encounter      Lesion of ulnar nerve, right upper limb      Aug 16, 2021  [facility]  [place]  Urgent Care      Localized edema      Other forms of dyspnea        Recent Inpatient Visit Summary  Date Facility [facility] Type Diagnoses or Chief Complaint    Feb 02, 2022  [facility] [place] [facility]  [place]  [place]  Medical Surgical      Acute kidney failure, unspecified        Care Team  Provider Specialty Phone Fax Service Dates   [facility] Clinic/Center: [facility]) [phone] Current    Ricard Dillon , MD Internal Medicine [phone] Current      [facility]  This patient has registered at the [facility] Emergency Department   For more information visit: [email or web address]     PLEASE NOTE:     1.   Any care recommendations and other clinical information are provided as guidelines or for historical purposes only, and providers should exercise their own clinical judgment when providing care.    2.   You may only use this information for purposes of treatment, payment or health care operations activities, and subject to the limitations of applicable [facility] Policies.    3.   You should consult directly with the organization that provided a care guideline or other clinical history with any questions about additional information or accuracy or completeness of information provided.    ? 2023 [employer] - [email or web address]

## 2022-05-01 NOTE — ED Provider Notes (Signed)
EMERGENCY DEPARTMENT HISTORY AND PHYSICAL EXAM     None        Date: 05/01/2022  Patient Name: [name]    History of Presenting Illness     Chief Complaint   Patient presents with    Arm Pain     R upperarm pain, r/o DVT       History Provided By: Patient        Additional History: [name] is a 41 y.o. female presenting to the ED with right shoulder and arm pain.  Patient reports that symptoms started about 1 week ago, she slipped and fell at her home, landed more on her left side at that time, thinks that she may be twisted her right shoulder.  She did not hit her head or lose consciousness.  She has had gradually worsening right shoulder and right upper arm pain which is worse when she tries to move her shoulder and better when she rests.  Taking Tylenol at home with no significant improvement in symptoms, cannot take NSAIDs due to history of stage III kidney disease.  Patient also has fibromyalgia.  Saw her rheumatologist today and was instructed to go to the ED for ultrasound to evaluate for DVT.  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  Recently flew back from [place] 1 week ago.  No chest pain or shortness of breath, no fevers or chills, otherwise feeling well.    PCP: Louann Sjogren, NP  SPECIALISTS:    Current Facility-Administered Medications   Medication Dose Route Frequency Provider Last Rate Last Admin    morphine injection 4 mg  4 mg Intramuscular Once Debbora Lacrosse, MD         Current Outpatient Medications   Medication Sig Dispense Refill    albuterol sulfate HFA (PROVENTIL) 108 (90 Base) MCG/ACT inhaler Inhale 2 puffs into the lungs every 4 (four) hours as needed for Wheezing 1 each 0    Alcohol Swabs Pads Use as indicated for insulin injections 120 each 3    apixaban (ELIQUIS) 5 MG Take 5 mg by mouth every 12 (twelve) hours      ARIPiprazole (ABILIFY) 5 MG  tablet Take 10 mg by mouth daily      aspirin 81 MG chewable tablet Chew 81 mg by mouth daily      atorvastatin (LIPITOR) 40 MG tablet Take 80 mg by mouth nightly      Azelastine-Fluticasone 137-50 MCG/ACT Suspension 1 spray by Nasal route 2 (two) times daily      baclofen (LIORESAL) 10 MG tablet Take 10 mg by mouth as needed      bumetanide (BUMEX) 2 MG tablet Take 1 tablet (2 mg total) by mouth 2 (two) times daily 60 tablet 0    buPROPion XL (WELLBUTRIN XL) 150 MG 24 hr tablet Take 150 mg by mouth daily      carvedilol (COREG) 25 MG tablet Take 25 mg by mouth 2 (two) times daily      cetirizine (ZyrTEC) 5 MG tablet Take 5 mg by mouth daily      diclofenac (VOLTAREN) 50 MG EC tablet Take 50 mg by mouth daily      diclofenac Sodium (VOLTAREN) 1 % Gel topical gel Apply 2 g topically 4 (four) times daily 50 g 0    docusate sodium (COLACE) 100 MG capsule Take 100  mg by mouth nightly      Dulera 200-5 MCG/ACT Aerosol Inhale 2 puffs into the lungs daily      DULoxetine (CYMBALTA) 60 MG capsule Take 60 mg by mouth daily      EPINEPHrine 0.15 MG/0.3ML injection Inject 0.15 mg into the muscle as needed      folic acid (FOLVITE) 1 MG tablet Take 1 mg by mouth daily      gabapentin (NEURONTIN) 800 MG tablet Take 800 mg by mouth 3 (three) times daily      hydrALAZINE (APRESOLINE) 50 MG tablet Take 50 mg by mouth 2 (two) times daily      hydrOXYzine (ATARAX) 10 MG tablet Take 10 mg by mouth every 6 (six) hours as needed for Anxiety      insulin detemir (Levemir) 100 UNIT/ML injection Inject 50 Units into the skin every 12 (twelve) hours (Patient taking differently: Inject 53 Units into the skin every 12 (twelve) hours) 30 mL 1    insulin lispro (HumaLOG) 100 UNIT/ML injection Inject 25 Units into the skin 3 (three) times daily before meals (Patient taking differently: Inject 25 Units into the skin 3 (three) times daily before meals) 30 mL 3    Insulin Syringe-Needle U-100 27G X 1/2" 1 ML Misc Inject 1 Dose into the skin 4  (four) times daily 500 each 3    lidocaine (LIDODERM) 5 % Place 1 patch onto the skin every 24 hours Remove & Discard patch within 12 hours or as directed by MD 15 patch 0    meclizine (ANTIVERT) 25 MG tablet Take 25 mg by mouth as needed      metOLazone (ZAROXOLYN) 10 MG tablet Take 10 mg by mouth daily      montelukast (SINGULAIR) 10 MG tablet Take 10 mg by mouth nightly      olmesartan (BENICAR) 40 MG tablet Take 40 mg by mouth daily      omeprazole (PriLOSEC) 40 MG capsule Take 40 mg by mouth nightly      Semaglutide,0.25 or 0.5MG /DOS, (Ozempic, 0.25 or 0.5 MG/DOSE,) 2 MG/1.5ML Solution Pen-injector Inject into the skin      topiramate (TOPAMAX) 100 MG tablet Take 100 mg by mouth daily      traZODone (DESYREL) 150 MG tablet Take 150 mg by mouth      Trulicity 1.5 MG/0.5ML Solution Pen-injector Inject 1.5 mg into the skin Once each week on Monday      valACYclovir (VALTREX) 1000 MG tablet Take 1 g by mouth as needed         Past History     Past Medical History:  Past Medical History:   Diagnosis Date    Anemia     Arthritis     Asthma     Chronic kidney disease     Stage III    Congestive heart failure     Diabetes mellitus     Fibromyalgia     Gastroesophageal reflux disease     Hyperlipidemia     Hypertension     Neuropathy     Pulmonary embolism 08/2019    Renal insufficiency     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     Scoliosis     Sleep apnea     no cpap    Type 2 diabetes mellitus, controlled     Vertigo        Past Surgical History:  Past Surgical History:   Procedure Laterality Date    ABLATION ENDOMETRIAL  APPENDECTOMY (OPEN)      CESAREAN SECTION      x 2    CHOLECYSTECTOMY      EYE SURGERY Bilateral     cataract    HYSTERECTOMY  12/2019    REPAIR, UPPER EXTREMITY, TENDON & NERVE Right 12/05/2021    Procedure: REPAIR, RIGHT ULNAR NERVE AND NEUROPLASTY @ ELBOW AND WRIST, RIGHT OPEN CARPAL TUNNEL RELEASE;  Surgeon: Cathrine Muster, MD;  Location: [facility];  Service: Plastics;  Laterality: Right;     UTERINE RUPTURE         Family History:  Family History   Problem Relation Age of Onset    Heart disease Mother     Hypertension Mother     Diabetes Mother     Asthma Father     Hypertension Father     Diabetes Daughter     Asthma Son     Cancer Maternal Aunt     Cancer Maternal Grandmother        Social History:  Social History     Tobacco Use    Smoking status: Never    Smokeless tobacco: Never   Vaping Use    Vaping Use: Never used   Substance Use Topics    Alcohol use: Never    Drug use: Never       Allergies:  Allergies   Allergen Reactions    Amlodipine Hives    Bactrim [Sulfamethoxazole-Trimethoprim] Hives, Respiratory Distress and Edema    Glipizide Edema     Throat swelling    Lisinopril Edema     Tongue swelling       Review of Systems         Review of Systems   Constitutional:  Negative for fever.   Eyes:  Negative for visual disturbance.   Respiratory:  Negative for shortness of breath.    Cardiovascular:  Negative for chest pain and leg swelling.   Gastrointestinal:  Negative for abdominal pain.   Musculoskeletal:  Positive for arthralgias and myalgias.   Skin:  Negative for color change.   Neurological:  Negative for weakness and numbness.   Hematological:  Does not bruise/bleed easily.   Psychiatric/Behavioral:  Negative for confusion.          Physical Exam   BP (!) 162/98   Pulse 86   Temp 98.2 F (36.8 C) (Oral)   Resp 16   Ht 5\' 6"  (1.676 m)   Wt 126.3 kg   SpO2 100%   BMI 44.94 kg/m         Physical Exam  Vitals and nursing note reviewed.   Constitutional:       Appearance: Normal appearance. She is obese.   HENT:      Head: Normocephalic and atraumatic.      Right Ear: External ear normal.      Left Ear: External ear normal.      Nose: Nose normal.      Mouth/Throat:      Mouth: Mucous membranes are moist.      Pharynx: Oropharynx is clear.   Eyes:      Conjunctiva/sclera: Conjunctivae normal.   Neck:      Comments: No bony tenderness over the cervical spine, negative Nexus  criteria  Cardiovascular:      Rate and Rhythm: Normal rate and regular rhythm.      Pulses: Normal pulses.      Heart sounds: Normal heart sounds. No murmur heard.  No friction rub. No gallop.   Pulmonary:      Effort: Pulmonary effort is normal.      Breath sounds: Normal breath sounds. No wheezing, rhonchi or rales.   Abdominal:      General: Bowel sounds are normal.      Palpations: Abdomen is soft.      Tenderness: There is no abdominal tenderness. There is no guarding or rebound.   Musculoskeletal:         General: Tenderness present. No swelling or deformity.      Cervical back: Normal range of motion and neck supple.      Right lower leg: No edema.      Left lower leg: No edema.      Comments: Tenderness over the right posterior deltoid and the right anterior shoulder, good range of motion but pain with extension and external rotation, pain on stress of the rotator cuff muscles, and pain over the triceps, no redness or swelling in the right arm, no palpable cord, 2+ radial and DP pulse, soft compartments   Skin:     General: Skin is dry.      Capillary Refill: Capillary refill takes less than 2 seconds.      Findings: No erythema.   Neurological:      Mental Status: She is alert.      Sensory: No sensory deficit.      Motor: No weakness.   Psychiatric:         Mood and Affect: Mood normal.         Behavior: Behavior normal.           Diagnostic Study Results     Labs -     Results       ** No results found for the last 24 hours. **            Radiologic Studies -   Radiology Results (24 Hour)       Procedure Component Value Units Date/Time    Shoulder Right 2+ Views [[id number]] Collected: 05/01/22 1407    Order Status: Completed Updated: 05/01/22 1411    Narrative:      INDICATION: shoulder pain    TECHNIQUE: 3 views of the right shoulder     FINDINGS: No acute fracture or dislocation demonstrated. Glenohumeral alignment maintained. Acromioclavicular joint intact. Soft tissues unremarkable. Visualized  portions of the right lung clear.      Impression:       No acute fracture or dislocation.    Gustavus Messing, MD  05/01/2022 2:09 PM        .    Medical Decision Making   I am the first provider for this patient.    I reviewed the vital signs, available nursing notes, past medical history, past surgical history, family history and social history.    Vital Signs-Reviewed the patient's vital signs.   Patient Vitals for the past 12 hrs:   BP Temp Pulse Resp   05/01/22 1259 (!) 162/98 98.2 F (36.8 C) 86 16       Pulse Oximetry Analysis - Normal 100% on RA    Cardiac Monitor:  Rate: 86  Rhythm:  Normal Sinus Rhythm       Old Medical Records: Old medical records.  Nursing notes.     Procedure:     ------------------- PROCEDURE: SPLINT APPLICATION  -------------------    Applied by tech, supervised by emergency provider.   Location: right shoulder   Procedure: The  area of the splint was appropriately positioned.  A sling was applied for definitive management.   Post-procedure: Good position.  Neurovascular status remains intact.  Patient tolerated the procedure well with no immediate complications.      ED Course:     2:51PM-  Counseled on diagnosis, f/u plans, medication use, home/self care, and signs and symptoms when to return to ED. Discussed diagnostic uncertainty and possibility of progression of illness. All questions from patient solicited and addressed. Pt expresses understanding, is stable, and amenable to discharge.         Provider Notes:     Medical Decision Making  Patient presents with right shoulder and upper arm pain.  Hypertensive, otherwise normal vital signs.  Patient with reproducible pain over her posterior deltoid and anterior shoulder, worse with movement of her arm, exam consistent with a shoulder strain.  X-rays negative for fracture or dislocation.  Patient with a normal radial pulse, normal neurovascular exam of the right arm.  Good range of motion of major joints, doubt septic arthritis, no  evidence of cellulitis.  Ultrasound negative for DVT.  Patient offered p.o. versus IM pain medication, chose IM, given IM morphine with improvement in symptoms.  Patient unable to take NSAIDs due to kidney disease.  Reviewed [place] PMP, no recent narcotic prescriptions for several months, plan to prescribe a small amount of Norco.  Discussed primary care and outpatient orthopedics follow-up for further evaluation if symptoms are not improving.    Problems Addressed:  Strain of right shoulder, initial encounter: acute illness or injury    Amount and/or Complexity of Data Reviewed  External Data Reviewed: notes.     Details: Hospital discharge summary 02/06/2022  Radiology: ordered and independent interpretation performed. Decision-making details documented in ED Course.     Details: X-rays interpreted by me, confirmed by radiology, no fracture or dislocation of the right shoulder    Risk  OTC drugs.  Prescription drug management.  Parenteral controlled substances.          Diagnosis     Clinical Impression:   1. Strain of right shoulder, initial encounter        Treatment Plan:   ED Disposition       ED Disposition   Discharge    Condition   --    Date/Time   Thu May 01, 2022  2:51 PM    Comment   [name] discharge to home/self care.    Condition at disposition: Stable                   _______________________________      Dr. Fonnie Mu, MD, is the primary provider for this patient.   _______________________________     Debbora Lacrosse, MD  05/01/22 [phone]

## 2022-05-12 ENCOUNTER — Telehealth: Payer: Self-pay | Admitting: Body Imaging

## 2022-05-12 NOTE — Telephone Encounter (Signed)
Request to evaluate pt for medical renal biopsy  H/O proteinuria  Labs dated 04/28/22 in Epic (media) reviewed    PLAN:  Outpatient medical renal biopsy in CVIR  Instruct pt to hold Eliquis for 48 hours and aspirin for 5 days prior to procedure  NPO after midnight before biopsy    Gilford Raid, NP  CVIR Department  [facility] [place]  [phone]

## 2022-05-21 NOTE — Discharge Instr - AVS First Page (Signed)
[facility]  Cardiovascular Interventional Radiology Discharge Instructions     We appreciate you trusting the [facility] team with your care today. This packet contains important information to ensure that your discharge home is safe and that you understand your post procedure needs to include; pain, bleeding, signs of infection and nausea in the setting of sedation or anesthesia.  Your care team members today:     Kidney Biopsy    Post procedure moderate sedation/anesthesia  Although you may be awake and alert in the [facility], a small amount of sedation remains in your system for 24 hours.  You are advised to go directly home. If possible have someone stay with you for the next 24 hours.   Do not drive a vehicle or operate heavy machinery for the next 24 hours.  Do not consume alcohol, tranquilizers, sleeping medication or any other non-prescribed medication for the remainder of the day.  Do not sign legal documents for 24 hours.   Post procedure nausea and vomiting   Diet: begin with liquids and progress your diet as tolerated. Nausea may occur in   the next 24 hours.  If you develop nausea and vomiting after your procedure follow these steps: Stop all intake of food and liquid. Do not eat or drink anything until the nausea /vomiting subsides and you start to feel better. Start your diet by eating ice chips or taking small sips of water. You may eat crackers or dry toast and or sips of clear liquids like ginger ale or apple juice. Advance your diet slowly. Avoid milk products, large, or fatty meals. If it does not improve, please contact : [facility] [phone]  Site care and instructions   Keep the site clean and leave dressing in place for 24 hours.   You may remove the dressing and take a shower 24 hours after your procedure. No swimming or tub bathing for 48 hours after procedure.    Keep free of lotions and powders   Avoid strenuous activity (jogging,  heavy lifting) for 3-5 days after your procedure.  Redness, swelling, drainage from the site, pain unrelieved by Tylenol or ibuprofen, or fever greater than 100.5, please contact  [place] [place] [facility] [phone]  If you have active bleeding from your site, please contact CVIR.   It is not uncommon to have pink tinged urine, however, if you have bright red urine or if your urine progressively becomes more red in color call CVIR     Pain management   If you have any pain or discomfort, you may take Tylenol (acetaminophen) or ibuprofen (Advil, Motrin).   Use prescribed pain medications only as directed.   Contact CVIR for severe pain not relieved by Tylenol or ibuprofen.                .   Medications / Diet   Resume your medications unless otherwise directed.   Resume previous diet.   Follow Up  Biopsy results are sent to the referring physician.    We are open from the hours of 7 am to 6 pm Monday through Friday. If you attempt to call us after hours with a question or concern and are unable to get a hold of us, or you feel the problem may be life threatening call 911 and go to the closest emergency room   I have received and understand these discharge instructions. I have no further questions regarding these   instructions.     Patient or Representative   ______________________________________________________________________                                                                                [facility]   CARDIOVASULAR INTERVENTIONAL RADIOLODY AND CARDIAC CATHETERIZATION LAB

## 2022-05-22 ENCOUNTER — Encounter
Admission: RE | Disposition: A | Discharge status: Home / Self Care | Payer: Self-pay | Source: Ambulatory Visit | Attending: Student in an Organized Health Care Education/Training Program

## 2022-05-22 ENCOUNTER — Ambulatory Visit
Admission: RE | Admit: 2022-05-22 | Discharge: 2022-05-22 | Disposition: A | Discharge status: Home / Self Care | Payer: BC Managed Care – PPO | Source: Ambulatory Visit | Attending: Student in an Organized Health Care Education/Training Program | Admitting: Student in an Organized Health Care Education/Training Program

## 2022-05-22 ENCOUNTER — Ambulatory Visit: Payer: Self-pay

## 2022-05-22 DIAGNOSIS — E1122 Type 2 diabetes mellitus with diabetic chronic kidney disease: Secondary | ICD-10-CM | POA: Insufficient documentation

## 2022-05-22 DIAGNOSIS — Z794 Long term (current) use of insulin: Secondary | ICD-10-CM | POA: Insufficient documentation

## 2022-05-22 DIAGNOSIS — E1121 Type 2 diabetes mellitus with diabetic nephropathy: Secondary | ICD-10-CM | POA: Insufficient documentation

## 2022-05-22 DIAGNOSIS — I129 Hypertensive chronic kidney disease with stage 1 through stage 4 chronic kidney disease, or unspecified chronic kidney disease: Secondary | ICD-10-CM | POA: Insufficient documentation

## 2022-05-22 DIAGNOSIS — N049 Nephrotic syndrome with unspecified morphologic changes: Secondary | ICD-10-CM

## 2022-05-22 DIAGNOSIS — N189 Chronic kidney disease, unspecified: Secondary | ICD-10-CM | POA: Insufficient documentation

## 2022-05-22 HISTORY — PX: BIOPSY, RENAL: IMG2547

## 2022-05-22 LAB — GLUCOSE WHOLE BLOOD - POCT
Whole Blood Glucose POCT: 121 mg/dL — ABNORMAL HIGH (ref 70–100)
Whole Blood Glucose POCT: 120 mg/dL — ABNORMAL HIGH (ref 70–100)

## 2022-05-22 LAB — LAB USE ONLY - HISTORICAL SURGICAL PATHOLOGY

## 2022-05-22 LAB — HEMOGLOBIN AND HEMATOCRIT, BLOOD
Hematocrit: 27.6 % — ABNORMAL LOW (ref 34.7–43.7)
Hgb: 8.4 g/dL — ABNORMAL LOW (ref 11.4–14.8)

## 2022-05-22 SURGERY — BIOPSY, RENAL
Anesthesia: IV Sedation

## 2022-05-22 MED ORDER — HYDRALAZINE HCL 20 MG/ML IJ SOLN
10.0000 mg | Freq: Once | INTRAMUSCULAR | Status: AC
Start: 2022-05-22 — End: 2022-05-22
  Administered 2022-05-22: 10 mg via INTRAVENOUS

## 2022-05-22 MED ORDER — FENTANYL CITRATE (PF) 50 MCG/ML IJ SOLN (WRAP)
50.0000 ug | INTRAMUSCULAR | Status: AC | PRN
Start: 2022-05-22 — End: 2022-05-22
  Administered 2022-05-22 (×4): 25 ug via INTRAVENOUS

## 2022-05-22 MED ORDER — LORAZEPAM 2 MG/ML IJ SOLN
INTRAMUSCULAR | Status: AC
Start: 2022-05-22 — End: ?
  Filled 2022-05-22: qty 1

## 2022-05-22 MED ORDER — LIDOCAINE HCL 1 % IJ SOLN
INTRAMUSCULAR | Status: AC
Start: 2022-05-22 — End: ?
  Filled 2022-05-22: qty 10

## 2022-05-22 MED ORDER — LIDOCAINE HCL 1 % IJ SOLN
INTRAMUSCULAR | Status: AC | PRN
Start: 2022-05-22 — End: 2022-05-22
  Administered 2022-05-22: 3 mL

## 2022-05-22 MED ORDER — HYDROCODONE-ACETAMINOPHEN 5-325 MG PO TABS
1.0000 | ORAL_TABLET | ORAL | Status: DC | PRN
Start: 2022-05-22 — End: 2022-05-22
  Administered 2022-05-22 (×2): 1 via ORAL

## 2022-05-22 MED ORDER — MIDAZOLAM HCL 1 MG/ML IJ SOLN (WRAP)
INTRAMUSCULAR | Status: AC
Start: 2022-05-22 — End: ?
  Filled 2022-05-22: qty 2

## 2022-05-22 MED ORDER — FENTANYL CITRATE (PF) 50 MCG/ML IJ SOLN (WRAP)
INTRAMUSCULAR | Status: AC
Start: 2022-05-22 — End: ?
  Filled 2022-05-22: qty 2

## 2022-05-22 MED ORDER — FLUMAZENIL 0.5 MG/5ML IV SOLN
0.2000 mg | INTRAVENOUS | Status: AC | PRN
Start: 2022-05-22 — End: 2022-05-22

## 2022-05-22 MED ORDER — HYDROCODONE-ACETAMINOPHEN 5-325 MG PO TABS
ORAL_TABLET | ORAL | Status: AC
Start: 2022-05-22 — End: ?
  Filled 2022-05-22: qty 1

## 2022-05-22 MED ORDER — SODIUM CHLORIDE 0.9 % IV SOLN
INTRAVENOUS | Status: DC
Start: 2022-05-22 — End: 2022-05-22

## 2022-05-22 MED ORDER — HYDRALAZINE HCL 20 MG/ML IJ SOLN
INTRAMUSCULAR | Status: AC
Start: 2022-05-22 — End: ?
  Filled 2022-05-22: qty 1

## 2022-05-22 MED ORDER — NALOXONE HCL 0.4 MG/ML IJ SOLN (WRAP)
0.1000 mg | INTRAMUSCULAR | Status: AC | PRN
Start: 2022-05-22 — End: 2022-05-22

## 2022-05-22 MED ORDER — LIDOCAINE HCL 1 % IJ SOLN
INTRAMUSCULAR | Status: AC | PRN
Start: 2022-05-22 — End: 2022-05-22
  Administered 2022-05-22: 7 mL

## 2022-05-22 MED ORDER — MIDAZOLAM HCL 1 MG/ML IJ SOLN (WRAP)
1.0000 mg | INTRAMUSCULAR | Status: AC | PRN
Start: 2022-05-22 — End: 2022-05-22
  Administered 2022-05-22 (×4): 0.5 mg via INTRAVENOUS

## 2022-05-22 MED ORDER — GELATIN ABSORBABLE 12-7 MM EX MISC
CUTANEOUS | Status: AC
Start: 2022-05-22 — End: ?
  Filled 2022-05-22: qty 1

## 2022-05-22 MED ORDER — LORAZEPAM 2 MG/ML IJ SOLN
0.5000 mg | INTRAMUSCULAR | Status: AC | PRN
Start: 2022-05-22 — End: 2022-05-22
  Administered 2022-05-22 (×2): 0.5 mg via INTRAVENOUS

## 2022-05-22 MED ORDER — HYDRALAZINE HCL 50 MG PO TABS
50.0000 mg | ORAL_TABLET | Freq: Two times a day (BID) | ORAL | Status: DC
Start: 2022-05-22 — End: 2022-05-22
  Administered 2022-05-22: 50 mg via ORAL
  Filled 2022-05-22 (×2): qty 1

## 2022-05-22 MED ORDER — VALSARTAN 160 MG PO TABS
320.0000 mg | ORAL_TABLET | Freq: Every day | ORAL | Status: DC
Start: 2022-05-22 — End: 2022-05-22
  Administered 2022-05-22: 320 mg via ORAL
  Filled 2022-05-22: qty 2

## 2022-05-22 MED ORDER — DIPHENHYDRAMINE HCL 50 MG/ML IJ SOLN
25.0000 mg | Freq: Once | INTRAMUSCULAR | Status: DC | PRN
Start: 2022-05-22 — End: 2022-05-22

## 2022-05-22 MED ORDER — ONDANSETRON HCL 4 MG/2ML IJ SOLN
4.0000 mg | Freq: Once | INTRAMUSCULAR | Status: DC
Start: 2022-05-22 — End: 2022-05-22

## 2022-05-22 SURGICAL SUPPLY — 3 items
DEVICE BIOPSY L15 CM FULL CORE CO-AXIAL (Needles) ×1
DEVICE BIOPSY L15 CM FULL CORE CO-AXIAL INTRODUCER OD18 GA ODSEC17 GA (Needles) IMPLANT
NEEDLE BX BPNC ULT 18GA 17GA 15CM FULL (Needles) ×1

## 2022-05-22 NOTE — Sedation Documentation (Addendum)
S: S/p:  Medical Renal Biopsy Dressing clean/dry/intact. Vital signs stable. Denies pain. Patient tolerated procedure. Patient transferred to [facility] and bedside report given to RN.      Versed 2 mg, Fentanyl 100 mcg, Lido 1% 10 ml given   Specimen taken by Pathology to the lab     Keep SBP under 140.

## 2022-05-22 NOTE — Progress Notes (Addendum)
Patient here for elective renal biopsy for proteinuria. No acute distress. Assessment complete. IV started BP 147/77, Dr. Joycelyn Man aware. Consent reviewed and signed, pregnancy waiver signed. PIV started. NP here to see her, meds ordered for hypertension. Resting quietly. Call light at hand.     0820 Dose of Hydralazine 50 mg given as ordered.     0910 - BP remains elevated. Given Ativan 0.5 mg IV and an additional dose of Hydralazine 10 mg IV. Resting quietly. Call light near.     1005 - BP more stable. Into procedure with IR staff.    1600 - Recovery uneventful. After 4 hours of bedrest assisted up to the bathroom to void clear yellow urine, no blood or clots noted. Dressing remains CDI. IR NP aware of 4 hour post H/H. PIV removed intact. Discharge teaching completed.     1610 - Taken via w/c to Door [address] where family was waiting. Assisted into the passenger side of the vehicle.

## 2022-05-22 NOTE — H&P (Signed)
HISTORY & PHYSICAL    History and Physical:     The patient was interviewed and examined in the preoperative holding area. The previously documented history and physical dated 04/28/22 was reviewed in detail and changes are none.    ASA Classification:   []    ASA 1  Healthy patient  []    ASA 2  Mild systemic illness  [x]    ASA 3  Systemic disease, though not incapacitating  []    ASA 4  Severe systemic disease that is a constant threat to life   []    ASA 5  Moribund condition, patient unexpected to live >24 hours, irrespective of procedure  []    E         Emergent procedure    Mallampati Score:   []  1  [x]  2  []  3  []  4  []  Intubated  []  Tracheostomy    Planned Anesthesia:   []  No sedation  [x]  Local  [x]  Moderate sedation  []  Deep sedation (with Anesthesiology present)  []  General Anesthesia     Airway Assesment:   [x]  Normal  [] Compromised    Diagnosis: Proteinuria  Procedure: Medical renal biopsy    The patient is medically stable and appropriate to undergo the planned procedure. All risks and alternatives were discussed with the patient and she agrees to proceed.       , NP  CVIR Department  [facility]  [phone]      I have evaluated the patient and agree with the above documented evaluation and plan.    Signed by: , DO  CVIR Department  [phone]

## 2022-05-22 NOTE — Brief Op Note (Signed)
[facility]  [facility]   Brief Op Note     Physician(s): Domanique Luckett M Zyaira Vejar, MD    Assistant(s):  None    Pre-operative Diagnosis: Proteinuria    Post-operative Diagnosis: Diagnosis is same as preop diagnosis    Procedure(s) Performed: Image guided renal biopsy                                                                                                                                                                                                                 Anesthesia:  Local with 1% lidocaine local and Moderate Sedation    Complications: None    Estimated Blood Loss:  Minimal    Blood Administered:  None    Fluid Aministered:  Per Nursing    Tubes and Drains: None    Implant(s):  None    Specimen(s): 3 18 gauge kidney biopsies were obtained.    Findings: US used to guide 18 gauge core biopsies of the left kidney.     Patient was transferred from the procedure room to the recovery area in stable condition without apparent complication.  Procedure note dictated.    Signed by: Jagjit Riner M Mckala Pantaleon, MD  CVIR Department  [facility] [phone]  [facility] [phone]

## 2022-05-23 ENCOUNTER — Telehealth: Payer: Self-pay | Admitting: Body Imaging

## 2022-05-23 NOTE — Telephone Encounter (Signed)
TELEPHONE CALL      Call to patient for f/u s/p outpatient biopsy in CVIR yesterday, 05/22/22.  Pt reports mild tenderness at biopsy site, otherwise she has no complaints.  Pt denies abdominal pain, flank pain,fever/chills, hematuria or N/V.    PLAN:   Reassurance provided regarding post procedure expectations.  Instructed pt to call CVIR with questions/concerns.    Gilford Raid, NP  CVIR Department  [facility]  [phone]

## 2022-05-28 ENCOUNTER — Encounter: Payer: Self-pay | Admitting: Interventional Radiology and Diagnostic Radiology

## 2022-05-29 LAB — LAB USE ONLY - HISTORICAL SURGICAL PATHOLOGY

## 2022-06-19 ENCOUNTER — Encounter (INDEPENDENT_AMBULATORY_CARE_PROVIDER_SITE_OTHER): Payer: BC Managed Care – PPO | Admitting: Rheumatology

## 2022-06-19 ENCOUNTER — Encounter (INDEPENDENT_AMBULATORY_CARE_PROVIDER_SITE_OTHER): Payer: Self-pay

## 2022-06-21 ENCOUNTER — Encounter (INDEPENDENT_AMBULATORY_CARE_PROVIDER_SITE_OTHER): Payer: Self-pay | Admitting: Rheumatology

## 2022-06-21 NOTE — Progress Notes (Signed)
Patient did not keep the appointment.  She indicated to admin staff she had already seen a Rheumatologist elsewhere.

## 2022-06-27 ENCOUNTER — Other Ambulatory Visit: Payer: Self-pay

## 2022-06-27 DIAGNOSIS — G5612 Other lesions of median nerve, left upper limb: Secondary | ICD-10-CM

## 2022-06-27 DIAGNOSIS — G5622 Lesion of ulnar nerve, left upper limb: Secondary | ICD-10-CM

## 2022-07-02 ENCOUNTER — Ambulatory Visit: Payer: BC Managed Care – PPO

## 2022-07-02 NOTE — PSS Phone Screening (Signed)
Pre-Anesthesia Evaluation    Pre-op phone visit requested by:   Reason for pre-op phone visit: Patient anticipating LEFT ULNAR NERVE NEUROPLASTY AT ELBOW AND WRIST, LEFT OPEN CARPAL TUNNEL RELEASE procedure.         No orders of the defined types were placed in this encounter.      History of Present Illness/Summary:        Problem List:  Medical Problems       Hospital Problem List  Date Reviewed: 06/21/2022   None        Non-Hospital Problem List  Date Reviewed: 06/21/2022            ICD-10-CM Priority Class Noted    Acute renal failure superimposed on stage 3 chronic kidney disease N17.9, N18.30   08/11/2020    Drug-induced hyperglycemia R73.9, T50.905A   08/11/2020    Peripheral edema R60.9   08/20/2020    CHF NYHA class III, acute, combined I50.41   08/20/2020    Morbid obesity E66.01   08/21/2020    Hypertension I10   08/21/2020    Fibromyalgia M79.7   08/21/2020    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  Appears well compensated today. Echo earlier this year showed LVEF 50-55% at OSH and G1DD.  Suspect that this is related to her history of uncontrolled hypertension. Currently on antihypertensive agents and diuretic medications. Being followed by nephrology in setting of her CKD who has also been overseeing her diuretic therapy.      I will not make any medication changes today, the patient will continue to monitor her blood pressure at home going forward.  No indication to make any changes at this time.         Gastroesophageal reflux disease K21.9   04/02/2021    Depression F32.A   02/11/2021     Overview Signed 03/05/2022 11:45 AM by Candy Sledge, PA     Last Assessment & Plan:   Formatting of this note might be different from the original.  Psychological condition is improving with treatment.  Continue current treatment regimen.  Psychological condition  will be reassessed in 3 months.         Obstructive sleep apnea G47.33   04/20/2021    Mass of upper outer quadrant of right breast N63.11   02/12/2021    Neuropathy G62.9   02/14/2012    Iron deficiency anemia D50.9   12/21/2020    Overview Signed 03/05/2022 11:45 AM by Candy Sledge, PA     Last Assessment & Plan:   Formatting of this note might be different from the original.  Discussed history of iron deficiency anemia treated with iron infusions.  She has not had an infusion since the fall 2021.  I will refer her to hematology for further work-up and evaluation.  The patient was also given a referral to gastroenterology as she is not menstruating and had a hysterectomy 1 year ago.  Concerned that her anemia may be contributing to her symptoms.  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  We recommended surgical release of cubital tunnel as well as release at carpal tunnel. At this time, after review of the therapeutic options, the patient elects to pursue the surgery. We discussed details of the operation. Risks include but are not limited to bleeding, infection, wound healing complications, injury to nerves/tendons/arteries, and stiffness. Specifically, I discussed with the patient that the goal of surgery would be prevention of further neurologic deficits rather than restoration of present deficits while clarifying that there may be lingering paresthesias or lacking motor function despite surgical management. After reviewing the risks, the patient would like to proceed with surgery.         Mixed  hyperlipidemia E78.2   10/31/2021    Overview Signed 03/05/2022 11:45 AM by Candy Sledge, PA     Last Assessment & Plan:   Formatting of this note might be different from the original.  No clinical ASCVD. No lipid panel on file. Not currently on lipid lowering therapy.    We discussed heart healthy, low-cholesterol dietary choices in detail at visit today.    -check lipid panel at next visit         Asthma J45.909   06/19/1996    Overview Signed 03/05/2022 11:45 AM by Candy Sledge, PA     Last Assessment & Plan:   Formatting of this note might be different from the original.  41 year old female with known history of asthma who was seen by pulmonologist yesterday.  Per patient report patient asthma improving and likely associated with allergies.  She was continued on her current medications with no oral steroids added.  The patient was advised to follow-up with ENT along with allergy specialist.  Per her report and discussion with her pulmonologist patient states that she is stable to return to work from a pulmonary standpoint.         Aneurysm of ascending aorta without rupture I71.21   11/01/2021    Overview Signed 03/05/2022 11:45 AM by Candy Sledge, PA     Last Assessment & Plan:   Formatting of this note might be different from the original.  Aorta noted to be mildly dilated on echocardiogram in May 2022.  Will need yearly monitoring at rest as well as working on blood pressure control as described above.  Next check in May 2023.         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 kr    CESAREAN SECTION      x 2    CHOLECYSTECTOMY      EYE SURGERY Bilateral     cataract    HYSTERECTOMY  12/2019    REPAIR, UPPER EXTREMITY, TENDON & NERVE Right 12/05/2021    Procedure: REPAIR, RIGHT ULNAR NERVE AND NEUROPLASTY @ ELBOW AND WRIST, RIGHT OPEN CARPAL TUNNEL RELEASE;  Surgeon: Cathrine MusterBarbour, John R, MD;  Location: [facility];  Service: Plastics;  Laterality: Right;    UTERINE RUPTURE          Medication List            Accurate as of July 02, 2022  2:30 PM. Always use your most recent med list.                albuterol sulfate HFA 108 (90 Base) MCG/ACT inhaler  Inhale 2 puffs into the lungs every 4 (four) hours as needed for Wheezing  Commonly known as: PROVENTIL  Medication Adjustments for Surgery: Take as needed     Alcohol Swabs Pads  Use as indicated for insulin injections  Medication Adjustments for Surgery: Take as prescribed     apixaban 5 MG  Take 1 tablet (5 mg) by mouth every 12 (twelve) hours  Commonly known as: ELIQUIS  Notes to patient: Follow MD instructions prior to surgery     ARIPiprazole 5 MG tablet  Take 2 tablets (10 mg) by mouth daily  Commonly known as: ABILIFY  Medication Adjustments for Surgery: Take morning of surgery     aspirin 81 MG chewable tablet  Chew 1 tablet (81 mg) by mouth daily  Notes to patient: Follow MD instructions prior to surgery     atorvastatin 40 MG tablet  Take 2 tablets (80 mg) by mouth nightly  Commonly known as: LIPITOR  Medication Adjustments for Surgery: Take as  prescribed     Azelastine-Fluticasone 137-50 MCG/ACT Susp  1 spray by Nasal route 2 (two) times daily  Medication Adjustments for Surgery: Take morning of surgery     baclofen 10 MG tablet  Take 1 tablet (10 mg) by mouth as needed  Commonly known as: LIORESAL  Medication Adjustments for Surgery: Take as needed     bumetanide 2 MG tablet  Take 1 tablet (2 mg total) by mouth 2 (two) times daily  Commonly known as: BUMEX  Medication Adjustments for Surgery: Hold day of surgery     buPROPion XL 150 MG 24 hr tablet  Take 1 tablet (150 mg) by mouth daily  Commonly known as: WELLBUTRIN XL  Medication Adjustments for Surgery: Take morning of surgery     carvedilol 25 MG tablet  Take 1 tablet (25 mg) by mouth 2 (two) times daily  Commonly known as: COREG  Medication Adjustments for Surgery: Take morning of surgery     diclofenac 50 MG EC tablet  Take 1 tablet (50 mg) by mouth daily  Commonly known as: VOLTAREN  Medication Adjustments for Surgery:  Last dose 24 hours before surgery     diclofenac Sodium 1 % Gel topical gel  Apply 2 g topically 4 (four) times daily  Commonly known as: VOLTAREN  Medication Adjustments for Surgery: Last dose 24 hours before surgery     docusate sodium 100 MG capsule  Take 1 capsule (100 mg) by mouth nightly  Commonly known as: COLACE  Medication Adjustments for Surgery: Take as prescribed     Dulera 200-5 MCG/ACT Aero  Inhale 2 puffs into the lungs daily  Generic drug: Mometasone Furo-Formoterol Fum  Medication Adjustments for Surgery: Take morning of surgery     DULoxetine 60 MG capsule  Take 1 capsule (60 mg) by mouth daily  Commonly known as: CYMBALTA  Medication Adjustments for Surgery: Take morning of surgery     EPINEPHrine 0.15 MG/0.3ML injection  Inject 0.3 mLs (0.15 mg) into the muscle as needed  Medication Adjustments for Surgery: Take as needed     folic acid 1 MG tablet  Take 1 tablet (1 mg) by mouth daily  Commonly known as: FOLVITE  Medication Adjustments for Surgery: Hold day of  surgery     hydrALAZINE 50 MG tablet  Take 1 tablet (50 mg) by mouth 2 (two) times daily  Commonly known as: APRESOLINE  Medication Adjustments for Surgery: Take morning of surgery     hydrOXYzine 10 MG tablet  Take 1 tablet (10 mg) by mouth every 6 (six) hours as needed for Anxiety  Commonly known as: ATARAX  Medication Adjustments for Surgery: Take as needed     insulin lispro 100 UNIT/ML injection  Inject 25 Units into the skin 3 (three) times daily before meals  Commonly known as: HumaLOG  Medication Adjustments for Surgery: Hold day of surgery     Insulin Syringe-Needle U-100 27G X 1/2" 1 ML Misc  Inject 1 Dose into the skin 4 (four) times daily  Medication Adjustments for Surgery: Take as prescribed     Levemir 100 UNIT/ML injection  Inject 50 Units into the skin every 12 (twelve) hours  Generic drug: insulin detemir  Medication Adjustments for Surgery: Take morning of surgery     lidocaine 5 %  Place 1 patch onto the skin every 24 hours Remove & Discard patch within 12 hours or as directed by MD  Commonly known as: LIDODERM  Medication Adjustments for Surgery: Hold day of surgery     meclizine 25 MG tablet  Take 1 tablet (25 mg) by mouth as needed  Commonly known as: ANTIVERT  Medication Adjustments for Surgery: Take as needed     metOLazone 10 MG tablet  Take 1 tablet (10 mg) by mouth daily  Commonly known as: ZAROXOLYN  Medication Adjustments for Surgery: Take morning of surgery     montelukast 10 MG tablet  Take 1 tablet (10 mg) by mouth nightly  Commonly known as: SINGULAIR  Medication Adjustments for Surgery: Take as prescribed     olmesartan 40 MG tablet  Take 1 tablet (40 mg) by mouth daily  Commonly known as: BENICAR  Medication Adjustments for Surgery: Hold day of surgery     omeprazole 40 MG capsule  Take 1 capsule (40 mg) by mouth nightly  Commonly known as: PriLOSEC  Medication Adjustments for Surgery: Take as prescribed     Ozempic (0.25 or 0.5 MG/DOSE) 2 MG/3ML Sopn  Inject into the skin Once  each week on Sunday morning  Generic drug: Semaglutide(0.25 or 0.5MG /DOS)  Medication Adjustments for Surgery: Take as  prescribed     topiramate 100 MG tablet  Take 1 tablet (100 mg) by mouth daily  Commonly known as: TOPAMAX  Medication Adjustments for Surgery: Take morning of surgery     traZODone 150 MG tablet  Take 1 tablet (150 mg) by mouth nightly  Commonly known as: DESYREL  Medication Adjustments for Surgery: Take as prescribed     valACYclovir 1000 MG tablet  Take 1 tablet (1,000 mg) by mouth as needed  Commonly known as: VALTREX  Medication Adjustments for Surgery: Hold day of surgery     Wegovy 1 MG/0.5ML injection  Inject 0.5 mLs (1 mg) into the skin once a week  Generic drug: semaglutide  Notes to patient: No taking this medication            Allergies   Allergen Reactions    Amlodipine Hives    Bactrim [Sulfamethoxazole-Trimethoprim] Hives, Respiratory Distress and Edema    Glipizide Edema     Throat swelling    Lisinopril Edema     Tongue swelling     Family History   Problem Relation Age of Onset    Heart disease Mother     Hypertension Mother     Diabetes Mother     Asthma Father     Hypertension Father     Diabetes Daughter     Asthma Son     Cancer Maternal Aunt     Cancer Maternal Grandmother      Social History     Occupational History    Not on file   Tobacco Use    Smoking status: Never    Smokeless tobacco: Never   Vaping Use    Vaping Use: Never used   Substance and Sexual Activity    Alcohol use: Never    Drug use: Never    Sexual activity: Not on file       Menstrual History:   LMP / Status  Hysterectomy     No LMP recorded. Patient has had a hysterectomy.    Tubal Ligation?  No valid surgical or medical questions entered.             Exam Scores:   SDB score Risk Category: OSA Diagnosed No PAP    PONV score Nausea Risk: VERY SEVERE RISK    MST score MST Score: 0    Allergy score Risk Category: Low Risk    Frailty score         Visit Vitals  Ht 1.676 m (5\' 6" )   Wt 132.9 kg (293 lb)   BMI  47.29 kg/m       Recent Labs   CBC (last 180 days) 02/05/22  0744 02/06/22  0614 05/22/22  1530   WBC 11.59* 13.04*  --    RBC 3.12* 3.04*  --    Hgb 8.1* 8.1* 8.4*   Hematocrit 26.4* 26.2* 27.6*   MCV 84.6 86.2  --    MCH 26.0 26.6  --    MCHC 30.7* 30.9*  --    RDW 13 13  --    Platelets 346 361*  --    MPV 10.2 10.6  --    Nucleated RBC 0.0 0.0  --    Absolute NRBC 0.00 0.00  --      Recent Labs   BMP (last 180 days) 02/05/22  0744 02/06/22  0614   Glucose 106* 101*   BUN 67.0* 73.0*   Creatinine 3.2* 3.2*   Sodium 135 136  Potassium 4.8 4.6   Chloride 106 107   CO2 22 19   Calcium 7.8* 7.8*   Anion Gap 7.0 10.0   EGFR 15.8 15.8     Recent Labs   Coag Panel (last 180 days) 02/03/22  0436   PTT 41*   PT 14.0*   PT INR 1.2*     Recent Labs   Other (last 180 days) 02/02/22  2306 02/03/22  0027 02/03/22  0436 02/05/22  0744   TSH  --   --  2.94  --    Bilirubin, Total 0.1*  --  0.1*  --    ALT 9  --  8  --    AST (SGOT) 9  --  7  --    Protein, Total 5.5*  --  4.9* 4.7*   Hemoglobin A1C  --   --  9.4*  --    NT-proBNP 572*  --   --   --    Iron  --   --  28*  --    Iron Saturation  --   --  18  --    Vitamin B-12  --   --  554  --    SARS-CoV-2 Specimen Source  --  Nasal Swab  --   --    SARS CoV 2 Overall Result  --  Not Detected  --   --

## 2022-07-02 NOTE — PSS Phone Screening (Signed)
Bedside glucose dos    Anesthesia Review - sleep apnea, no cpap, Eliquis    Dr. Marlowe Kays - H&P, Labs, EKG requested    Last Nephro Note scanned under Notes 05/22/22 - Dr. Shelva Majestic    Last Cardiac Note in Care Everywhere 12/03/21 - Dr. Josiah Lobo

## 2022-07-02 NOTE — Addendum Note (Signed)
Addended by: Corky Crafts on: 07/02/2022 02:41 PM     Modules accepted: Orders

## 2022-07-10 ENCOUNTER — Ambulatory Visit: Payer: BC Managed Care – PPO | Admitting: Certified Registered"

## 2022-07-10 ENCOUNTER — Encounter
Admission: RE | Disposition: A | Discharge status: Home / Self Care | Payer: Self-pay | Source: Ambulatory Visit | Attending: Plastic Surgery

## 2022-07-10 ENCOUNTER — Encounter: Payer: Self-pay | Admitting: Plastic Surgery

## 2022-07-10 ENCOUNTER — Ambulatory Visit
Admission: RE | Admit: 2022-07-10 | Discharge: 2022-07-10 | Disposition: A | Discharge status: Home / Self Care | Payer: BC Managed Care – PPO | Source: Ambulatory Visit | Attending: Plastic Surgery | Admitting: Plastic Surgery

## 2022-07-10 DIAGNOSIS — G5612 Other lesions of median nerve, left upper limb: Secondary | ICD-10-CM | POA: Insufficient documentation

## 2022-07-10 DIAGNOSIS — E1165 Type 2 diabetes mellitus with hyperglycemia: Secondary | ICD-10-CM | POA: Insufficient documentation

## 2022-07-10 DIAGNOSIS — Z794 Long term (current) use of insulin: Secondary | ICD-10-CM | POA: Insufficient documentation

## 2022-07-10 DIAGNOSIS — G5622 Lesion of ulnar nerve, left upper limb: Secondary | ICD-10-CM | POA: Insufficient documentation

## 2022-07-10 DIAGNOSIS — G4733 Obstructive sleep apnea (adult) (pediatric): Secondary | ICD-10-CM | POA: Insufficient documentation

## 2022-07-10 DIAGNOSIS — I130 Hypertensive heart and chronic kidney disease with heart failure and stage 1 through stage 4 chronic kidney disease, or unspecified chronic kidney disease: Secondary | ICD-10-CM | POA: Insufficient documentation

## 2022-07-10 DIAGNOSIS — N179 Acute kidney failure, unspecified: Secondary | ICD-10-CM | POA: Insufficient documentation

## 2022-07-10 DIAGNOSIS — N183 Chronic kidney disease, stage 3 unspecified: Secondary | ICD-10-CM | POA: Insufficient documentation

## 2022-07-10 DIAGNOSIS — M7702 Medial epicondylitis, left elbow: Secondary | ICD-10-CM | POA: Insufficient documentation

## 2022-07-10 DIAGNOSIS — I509 Heart failure, unspecified: Secondary | ICD-10-CM | POA: Insufficient documentation

## 2022-07-10 DIAGNOSIS — K219 Gastro-esophageal reflux disease without esophagitis: Secondary | ICD-10-CM | POA: Insufficient documentation

## 2022-07-10 DIAGNOSIS — J4552 Severe persistent asthma with status asthmaticus: Secondary | ICD-10-CM | POA: Insufficient documentation

## 2022-07-10 DIAGNOSIS — I2699 Other pulmonary embolism without acute cor pulmonale: Secondary | ICD-10-CM | POA: Insufficient documentation

## 2022-07-10 DIAGNOSIS — N049 Nephrotic syndrome with unspecified morphologic changes: Secondary | ICD-10-CM | POA: Insufficient documentation

## 2022-07-10 DIAGNOSIS — E1122 Type 2 diabetes mellitus with diabetic chronic kidney disease: Secondary | ICD-10-CM | POA: Insufficient documentation

## 2022-07-10 DIAGNOSIS — G5602 Carpal tunnel syndrome, left upper limb: Secondary | ICD-10-CM | POA: Insufficient documentation

## 2022-07-10 DIAGNOSIS — I7121 Aneurysm of the ascending aorta, without rupture: Secondary | ICD-10-CM | POA: Insufficient documentation

## 2022-07-10 HISTORY — PX: RELEASE, CARPAL TUNNEL: SHX5072

## 2022-07-10 HISTORY — PX: REPAIR, UPPER EXTREMITY, TENDON & NERVE: SHX5338

## 2022-07-10 LAB — GLUCOSE WHOLE BLOOD - POCT: Whole Blood Glucose POCT: 124 mg/dL — ABNORMAL HIGH (ref 70–100)

## 2022-07-10 SURGERY — REPAIR, UPPER EXTREMITY, TENDON & NERVE
Anesthesia: Anesthesia General | Site: Arm Lower | Laterality: Left | Wound class: Clean

## 2022-07-10 MED ORDER — EPHEDRINE SULFATE 50 MG/ML IJ/IV SOLN (WRAP)
Status: DC | PRN
Start: 2022-07-10 — End: 2022-07-10
  Administered 2022-07-10 (×3): 10 mg via INTRAVENOUS

## 2022-07-10 MED ORDER — SCOPOLAMINE 1 MG/3DAYS TD PT72
MEDICATED_PATCH | TRANSDERMAL | Status: DC | PRN
Start: 2022-07-10 — End: 2022-07-10
  Administered 2022-07-10: 1 via TRANSDERMAL

## 2022-07-10 MED ORDER — ROCURONIUM BROMIDE 50 MG/5ML IV SOLN
INTRAVENOUS | Status: AC
Start: 2022-07-10 — End: ?
  Filled 2022-07-10: qty 5

## 2022-07-10 MED ORDER — CEPHALEXIN 500 MG PO CAPS
500.0000 mg | ORAL_CAPSULE | Freq: Four times a day (QID) | ORAL | Status: DC
Start: 2022-07-10 — End: 2022-07-10

## 2022-07-10 MED ORDER — GABAPENTIN 300 MG PO CAPS
300.0000 mg | ORAL_CAPSULE | ORAL | Status: AC
Start: 2022-07-10 — End: 2022-07-10
  Administered 2022-07-10: 300 mg via ORAL

## 2022-07-10 MED ORDER — ONDANSETRON HCL 4 MG/2ML IJ SOLN
4.0000 mg | Freq: Once | INTRAMUSCULAR | Status: DC | PRN
Start: 2022-07-10 — End: 2022-07-10

## 2022-07-10 MED ORDER — FENTANYL CITRATE (PF) 50 MCG/ML IJ SOLN (WRAP)
INTRAMUSCULAR | Status: AC
Start: 2022-07-10 — End: ?
  Filled 2022-07-10: qty 1

## 2022-07-10 MED ORDER — SUCCINYLCHOLINE CHLORIDE 20 MG/ML IJ SOLN
INTRAMUSCULAR | Status: AC
Start: 2022-07-10 — End: ?
  Filled 2022-07-10: qty 5

## 2022-07-10 MED ORDER — PROMETHAZINE HCL 25 MG/ML IJ SOLN
6.2500 mg | Freq: Once | INTRAMUSCULAR | Status: DC | PRN
Start: 2022-07-10 — End: 2022-07-10

## 2022-07-10 MED ORDER — OXYCODONE-ACETAMINOPHEN 5-325 MG PO TABS
ORAL_TABLET | ORAL | Status: AC
Start: 2022-07-10 — End: ?
  Filled 2022-07-10: qty 1

## 2022-07-10 MED ORDER — STERILE WATER FOR INJECTION IJ/IV SOLN (WRAP)
2.0000 g | Freq: Once | INTRAMUSCULAR | Status: DC
Start: 2022-07-10 — End: 2022-07-10

## 2022-07-10 MED ORDER — FENTANYL CITRATE (PF) 50 MCG/ML IJ SOLN (WRAP)
INTRAMUSCULAR | Status: DC | PRN
Start: 2022-07-10 — End: 2022-07-10
  Administered 2022-07-10 (×2): 50 ug via INTRAVENOUS

## 2022-07-10 MED ORDER — DEXAMETHASONE SOD PHOSPHATE PF 10 MG/ML IJ SOLN
INTRAMUSCULAR | Status: AC
Start: 2022-07-10 — End: ?
  Filled 2022-07-10: qty 1

## 2022-07-10 MED ORDER — ONDANSETRON HCL 4 MG/2ML IJ SOLN
INTRAMUSCULAR | Status: AC
Start: 2022-07-10 — End: ?
  Filled 2022-07-10: qty 2

## 2022-07-10 MED ORDER — SCOPOLAMINE 1 MG/3DAYS TD PT72
MEDICATED_PATCH | TRANSDERMAL | Status: AC
Start: 2022-07-10 — End: ?
  Filled 2022-07-10: qty 1

## 2022-07-10 MED ORDER — ACETAMINOPHEN 500 MG PO TABS
1000.0000 mg | ORAL_TABLET | ORAL | Status: AC
Start: 2022-07-10 — End: 2022-07-10
  Administered 2022-07-10: 1000 mg via ORAL

## 2022-07-10 MED ORDER — ACETAMINOPHEN 500 MG PO TABS
ORAL_TABLET | ORAL | Status: AC
Start: 2022-07-10 — End: ?
  Filled 2022-07-10: qty 2

## 2022-07-10 MED ORDER — HYDROMORPHONE HCL 1 MG/ML IJ SOLN
INTRAMUSCULAR | Status: DC | PRN
Start: 2022-07-10 — End: 2022-07-10
  Administered 2022-07-10: .5 mg via INTRAVENOUS

## 2022-07-10 MED ORDER — PROPOFOL 10 MG/ML IV EMUL (WRAP)
INTRAVENOUS | Status: DC | PRN
Start: 2022-07-10 — End: 2022-07-10
  Administered 2022-07-10: 250 mg via INTRAVENOUS
  Administered 2022-07-10: 50 mg via INTRAVENOUS

## 2022-07-10 MED ORDER — BUPIVACAINE HCL (PF) 0.5 % IJ SOLN
INTRAMUSCULAR | Status: AC
Start: 2022-07-10 — End: ?
  Filled 2022-07-10: qty 30

## 2022-07-10 MED ORDER — OXYCODONE-ACETAMINOPHEN 5-325 MG PO TABS
1.0000 | ORAL_TABLET | Freq: Once | ORAL | Status: AC | PRN
Start: 2022-07-10 — End: 2022-07-10
  Administered 2022-07-10: 1 via ORAL

## 2022-07-10 MED ORDER — MIDAZOLAM HCL 1 MG/ML IJ SOLN (WRAP)
INTRAMUSCULAR | Status: DC | PRN
Start: 2022-07-10 — End: 2022-07-10
  Administered 2022-07-10: 2 mg via INTRAVENOUS

## 2022-07-10 MED ORDER — PHENYLEPHRINE 100 MCG/ML IV BOLUS (ANESTHESIA)
PREFILLED_SYRINGE | INTRAVENOUS | Status: DC | PRN
Start: 2022-07-10 — End: 2022-07-10
  Administered 2022-07-10 (×3): 200 ug via INTRAVENOUS
  Administered 2022-07-10 (×2): 100 ug via INTRAVENOUS

## 2022-07-10 MED ORDER — LACTATED RINGERS IV SOLN
INTRAVENOUS | Status: DC | PRN
Start: 2022-07-10 — End: 2022-07-10

## 2022-07-10 MED ORDER — FENTANYL CITRATE (PF) 50 MCG/ML IJ SOLN (WRAP)
25.0000 ug | INTRAMUSCULAR | Status: DC | PRN
Start: 2022-07-10 — End: 2022-07-10

## 2022-07-10 MED ORDER — HYDROMORPHONE HCL 0.5 MG/0.5 ML IJ SOLN
0.5000 mg | INTRAMUSCULAR | Status: DC | PRN
Start: 2022-07-10 — End: 2022-07-10

## 2022-07-10 MED ORDER — LIDOCAINE HCL 2 % IJ SOLN
INTRAMUSCULAR | Status: DC | PRN
Start: 2022-07-10 — End: 2022-07-10
  Administered 2022-07-10: 100 mg

## 2022-07-10 MED ORDER — EPHEDRINE SULFATE 50 MG/ML IJ/IV SOLN (WRAP)
Status: AC
Start: 2022-07-10 — End: ?
  Filled 2022-07-10: qty 1

## 2022-07-10 MED ORDER — GABAPENTIN 300 MG PO CAPS
ORAL_CAPSULE | ORAL | Status: AC
Start: 2022-07-10 — End: ?
  Filled 2022-07-10: qty 1

## 2022-07-10 MED ORDER — PROPOFOL 10 MG/ML IV EMUL (WRAP)
INTRAVENOUS | Status: AC
Start: 2022-07-10 — End: ?
  Filled 2022-07-10: qty 40

## 2022-07-10 MED ORDER — MIDAZOLAM HCL 1 MG/ML IJ SOLN (WRAP)
INTRAMUSCULAR | Status: AC
Start: 2022-07-10 — End: ?
  Filled 2022-07-10: qty 2

## 2022-07-10 MED ORDER — OXYCODONE-ACETAMINOPHEN 5-325 MG PO TABS
1.0000 | ORAL_TABLET | Freq: Four times a day (QID) | ORAL | 0 refills | Status: AC | PRN
Start: 2022-07-10 — End: 2022-07-17

## 2022-07-10 MED ORDER — LIDOCAINE HCL (PF) 2 % IJ SOLN
INTRAMUSCULAR | Status: AC
Start: 2022-07-10 — End: ?
  Filled 2022-07-10: qty 5

## 2022-07-10 MED ORDER — CEPHALEXIN 500 MG PO CAPS
500.0000 mg | ORAL_CAPSULE | Freq: Three times a day (TID) | ORAL | 0 refills | Status: AC
Start: 2022-07-10 — End: 2022-07-17

## 2022-07-10 MED ORDER — ONDANSETRON HCL 4 MG/2ML IJ SOLN
INTRAMUSCULAR | Status: DC | PRN
Start: 2022-07-10 — End: 2022-07-10
  Administered 2022-07-10: 4 mg via INTRAVENOUS

## 2022-07-10 MED ORDER — CEFAZOLIN SODIUM 1 G IJ SOLR
INTRAMUSCULAR | Status: DC | PRN
Start: 2022-07-10 — End: 2022-07-10
  Administered 2022-07-10: 3 g via INTRAVENOUS

## 2022-07-10 MED ORDER — CEFAZOLIN SODIUM 1 G IJ SOLR
INTRAMUSCULAR | Status: AC
Start: 2022-07-10 — End: ?
  Filled 2022-07-10: qty 3000

## 2022-07-10 MED ORDER — HYDROMORPHONE HCL 1 MG/ML IJ SOLN
INTRAMUSCULAR | Status: AC
Start: 2022-07-10 — End: ?
  Filled 2022-07-10: qty 1

## 2022-07-10 MED ORDER — OXYCODONE-ACETAMINOPHEN 5-325 MG PO TABS
1.0000 | ORAL_TABLET | ORAL | Status: DC | PRN
Start: 2022-07-10 — End: 2022-07-10

## 2022-07-10 MED ORDER — LACTATED RINGERS IV SOLN
INTRAVENOUS | Status: DC
Start: 2022-07-10 — End: 2022-07-10

## 2022-07-10 MED ORDER — SUCCINYLCHOLINE CHLORIDE 20 MG/ML IJ SOLN
INTRAMUSCULAR | Status: DC | PRN
Start: 2022-07-10 — End: 2022-07-10
  Administered 2022-07-10: 160 mg via INTRAVENOUS

## 2022-07-10 MED ORDER — SODIUM CHLORIDE 0.9 % IV SOLN
INTRAVENOUS | Status: DC | PRN
Start: 2022-07-10 — End: 2022-07-10

## 2022-07-10 MED ORDER — PHENYLEPHRINE HCL 10 MG/ML IV SOLN (WRAP)
Status: AC
Start: 2022-07-10 — End: ?
  Filled 2022-07-10: qty 1

## 2022-07-10 MED ORDER — BUPIVACAINE HCL 0.5 % IJ SOLN
INTRAMUSCULAR | Status: DC | PRN
Start: 2022-07-10 — End: 2022-07-10
  Administered 2022-07-10: 20 mL

## 2022-07-10 SURGICAL SUPPLY — 76 items
APPLICATOR CHLORAPREP 26 ML 70% ISOPROPYL ALCOHOL 2% CHLORHEXIDINE (Applicator) ×1 IMPLANT
APPLICATOR PRP 70% ISPRP 2% CHG 26ML (Applicator) ×1
BANDAGE CMPR POR THN CBN 5YDX3IN LTX (Dressing) ×1
BANDAGE COBAN THIN COMPRESSION L5 YD X W3 IN SELF ADHERENT WRAP (Dressing) ×1 IMPLANT
CORD ELECTROSURGICAL L12 FT BIPOLAR (Instrument) ×1
CORD ELECTROSURGICAL L12 FT BIPOLAR FORCEPS FOOTSWITCH VALLEYLAB (Instrument) ×1 IMPLANT
CORD ESURG VLAB 12FT LF STRL BP FCP (Instrument) ×1
DRAPE SRG SM STRDRP 25X22IN LF STRL ADH (Drape) ×1
DRAPE SRG STERI LG W/TWL 23X17IN STRL (Drape) ×2
DRAPE SRGCL TOWEL PLSTC ADHSV L23 IN X W17 IN STERI-DRAPE LG (Drape) ×2 IMPLANT
DRAPE SURGICAL ADHESIVE APERTURE FLUID (Drape) ×1
DRAPE SURGICAL ADHESIVE APERTURE FLUID CONTROL L25 IN X W22 IN (Drape) ×1 IMPLANT
DRAPE SURGICAL TOWEL PLASTIC ADHESIVE (Drape) ×2
DRESSING PETRO 3% BI 3BRM GZE XR 8X1IN (Dressing) ×1
DRESSING PETROLATUM XEROFORM L8 IN X W1 (Dressing) ×1
DRESSING PETROLATUM XEROFORM L8 IN X W1 IN 3% BISMUTH TRIBROMOPHENATE (Dressing) ×2 IMPLANT
DRESSING TELFA 3X8IN STERILE (Dressing) IMPLANT
DRESSING WND GZE KRLX 4.125YDX4.5IN LF (Dressing) ×1
DRESSING WOUND KERLIX L4.125 YD X W4.5 (Dressing) ×1
DRESSING WOUND KERLIX L4.125 YD X W4.5 IN 6 PLY ANTIMICROBIAL (Dressing) IMPLANT
ELECTRODE ADULT PATIENT RETURN L9 FT REM POLYHESIVE ACRYLIC FOAM (Procedure Accessories) ×1 IMPLANT
ELECTRODE PATIENT RETURN L9 FT VALLEYLAB (Procedure Accessories) ×1
ELECTRODE PT RTN RM PHSV ACRL FM C30- LB (Procedure Accessories) ×1
GLOVE SRG 7 BGL SRG LTX STRL PF BEAD CUF (Glove) ×2
GLOVE SURGICAL 7 BIOGEL SURGEONS POWDER (Glove) ×2
GLOVE SURGICAL 7 BIOGEL SURGEONS POWDER FREE BEAD CUFF TEXTURE SURFACE (Glove) ×2 IMPLANT
GOWN SRG XL ORBS LF STRL AAMI LVL 4 (Gown) ×1
GOWN SURGICAL XL BLUE AAMI LEVEL 4 BREATHABLE CSR WRAP PROTECTION (Gown) ×1 IMPLANT
GOWN SURGICAL XL MEDLINE BLUE AAMI LEVEL (Gown) ×1
KIT RM TURNOVER LF NS DISP (Kits) ×1
KIT ROOM TURNOVER NONSTERILE LATEX FREE DISPOSABLE (Kits) ×1 IMPLANT
LOOP VESSEL MICRO BLUE 2PK (Procedure Accessories) IMPLANT
LOOP VESSEL MINI ELLIPTICAL L406 MM X (External Fixator) ×1
LOOP VESSEL MINI ELLIPTICAL L406 MM X W.8 MM RED 2 POUCH RADIOPAQUE (External Fixator) ×1 IMPLANT
LOOP VSL SIL MN ELIP STERION 406X.8MM LF (External Fixator) ×1
PADDING CAST L4 YD X W4 IN COHESION HAND TEARABLE SELF BOND SPECIALIST (Procedure Accessories) ×2 IMPLANT
PADDING CST CTTN SPCLST 100 4YDX4IN LF (Procedure Accessories) ×2
POSITIONER HEAD DONUT 9IN (Positioning Supplies) ×1
POSITIONER HEAD FOAM POSITIONING DONUT OD9 IN (Positioning Supplies) ×1 IMPLANT
RULER SRG SIL 1SQ MM MCGRD 30X15X.4MM (Procedure Accessories)
RULER SURGICAL L30 MM X W15 MM X H.4 MM (Procedure Accessories) IMPLANT
SET IRR DEHP 10 GTT/ML Y 82IN LF STRL (Tubing) ×1
SET IRRIGATION L82 IN 10 GTT/ML Y DEHP (Tubing) ×1
SET IRRIGATION L82 IN 10 GTT/ML Y NA DEHP BLADDER REGULATE CLAMP (Tubing) ×1 IMPLANT
SLEEVE CMPR MED KN LGTH KDL SCD 21- IN (Sleeve) ×1
SLEEVE COMPRESSION MEDIUM KNEE LENGTH KENDALL SEQUENTIAL OD21- IN (Sleeve) ×1 IMPLANT
SOLUTION IRR 0.9% NACL 1000ML LF STRL (Irrigation Solutions) ×1
SOLUTION IRRIGATION 0.9% SODIUM CHLORIDE (Irrigation Solutions) ×1
SOLUTION IRRIGATION 0.9% SODIUM CHLORIDE 1000 ML PLASTIC POUR BOTTLE (Irrigation Solutions) ×1 IMPLANT
SPLINT ORTH FBGLS SAFETYSPLINT 15X4IN LF (Cast) ×1
SPLINT ORTHOPEDIC L15 IN X W4 IN PRECUT (Cast) ×1
SPLINT ORTHOPEDIC L15 IN X W4 IN PRECUT LIGHTWEIGHT WASHABLE (Cast) ×1 IMPLANT
SPONGE GAUZE L4 IN X W4 IN 16 PLY (Dressing) ×1
SPONGE GAUZE L4 IN X W4 IN 16 PLY MAXIMUM ABSORBENT USP TYPE VII (Dressing) IMPLANT
SPONGE GZE CTTN CRTY 4X4IN LF NS 16 PLY (Dressing) ×1
STAPLER SKIN L3.9 MM X W6.9 MM WIDE 35 (Skin Closure) ×1
STAPLER SKIN L3.9 MM X W6.9 MM WIDE 35 COUNT FIX HEAD RATCHET (Skin Closure) ×1 IMPLANT
STAPLER SKN SS W PRX PX .58MM 3.9X6.9MM (Skin Closure) ×1
SUTURE ABS 3-0 PS2 MNCRL MTPS 27IN MFL (Suture) ×1
SUTURE ABS 3-0 SH VCL 27IN BRD COAT UD (Suture) ×1
SUTURE COATED VICRYL 3-0 SH L27 IN BRAID (Suture) ×1 IMPLANT
SUTURE ETHIBOND EXCEL GREEN 2-0 SH L30 (Suture) ×1
SUTURE ETHIBOND EXCEL GREEN 2-0 SH L30 IN BRAID NONABSORBABLE (Suture) ×1 IMPLANT
SUTURE ETHILON BLACK 4-0 PS-2 L19 IN (Suture) ×4
SUTURE ETHILON BLACK 4-0 PS-2 L19 IN MONOFILAMENT NONABSORBABLE (Suture) ×4 IMPLANT
SUTURE ETHILON BLACK 8-0 BV130-5 L5 IN (Suture)
SUTURE ETHILON BLACK 8-0 BV130-5 L5 IN MONOFILAMENT NONABSORBABLE (Suture) IMPLANT
SUTURE MONOCRYL 3-0 PS-2 L27 IN (Suture) ×1
SUTURE MONOCRYL 3-0 PS-2 L27 IN MONOFILAMENT UNDYED ABSORBABLE (Suture) ×1 IMPLANT
SUTURE NABSB 2-0 SH EBND EXC 30IN BRD (Suture) ×1
SUTURE NABSB 4-0 PS2 ETH MTPS 19IN MFL (Suture) ×4
SUTURE NABSB 8-0 BV130-5 ETH MTPS 5IN (Suture)
TEGADERM CLR ACRYLLIC 4.5X6.8 (Dressing) IMPLANT
TRAY SRG HAND UPPER EXTREMITY ~~LOC~~ (Pack) ×1
TRAY SURGICAL HAND UPPER EXTREMITY ~~LOC~~ (Pack) ×1 IMPLANT
WRAP COMPRESSION L5 YD X W3 IN THIN SELF (Dressing) ×1

## 2022-07-10 NOTE — Interval H&P Note (Signed)
Patient seen and personally examined and no relevant changes to H&P on record

## 2022-07-10 NOTE — Discharge Instr - AVS First Page (Addendum)
Reason for your Hospital Admission:  Release of the nerves in your left arm/hand      Instructions for after your discharge:  Continue the dressing at all times until your follow-up appointment next week  Do not get the dressing wet or soiled (cover with plastic bag in the shower)    Keep the hand elevated at all times to reduce pain and swelling.  Gravity is the reason swelling continues and the pain is often related to swelling.  This is important  You can use ICE (20 minutes of ice in a ziplock bag) over the dressing 4-6 times daily    Take Ibuprofen and Tylenol at alternating times according to the need, usually every 3 hours in addition to your stronger pain medications  Take the strong pain medications by prescription as needed only    Take the prescribed antibiotics as directed with food- do not skip any doses.

## 2022-07-10 NOTE — Anesthesia Postprocedure Evaluation (Signed)
Anesthesia Post Evaluation    Patient: [name]    Procedure(s):  LEFT ULNAR NERVE NEUROPLASTY AT ELBOW AND WRIST, LEFT OPEN CARPAL TUNNEL RELEASE    Anesthesia type: general    Last Vitals:   Vitals Value Taken Time   BP 156/71 07/10/22 1115   Temp 36.4 C (97.6 F) 07/10/22 1113   Pulse 80 07/10/22 1115   Resp 12 07/10/22 1115   SpO2 96 % 07/10/22 1115                 Anesthesia Post Evaluation:     Patient Evaluated: PACU  Patient Participation: complete - patient participated  Level of Consciousness: awake and alert    Pain Management: adequate    Airway Patency: patent        Anesthetic complications: No      PONV Status: none    Cardiovascular status: acceptable  Respiratory status: acceptable  Hydration status: acceptable          Signed by: Leilani Able, MD, 07/10/2022 11:16 AM

## 2022-07-10 NOTE — Anesthesia Preprocedure Evaluation (Addendum)
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    ECG reviewed  pertinent labs reviewed             Signed by: Leilani Able, MD 07/10/22 8:45 AM    ===============================================================  Inpatient Anesthesia Evaluation    Patient Name: [name], [name]  Surgeon: Cathrine Muster, MD  Patient Age / Sex: 41 y.o. / female    Medical History:     Past Medical History:   Diagnosis Date   . Anemia    . Arthritis    . Asthma    . Chronic kidney disease     Stage III   . Congestive heart failure    . Deep venous thrombosis of distal lower extremity    . Diabetes mellitus    . Fibromyalgia    . Gastroesophageal reflux disease    . Hyperlipidemia    . Hypertension    . Neuropathy    . Pulmonary embolism 08/2019   . Renal insufficiency    . 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 Scoliosis    . Sleep apnea     no cpap   . Type 2 diabetes mellitus, controlled    . Vertigo        Past Surgical History:   Procedure  Laterality Date   . ABLATION ENDOMETRIAL     . APPENDECTOMY (OPEN)     . BIOPSY, RENAL N/A 05/22/2022    Procedure: Biopsy, Renal;  Surgeon: Verlee Rossetti, MD;  Location: [facility];  Service: Interventional Radiology;  Laterality: N/A;  Per Didem, ptt advised to hold Eliquis for 48 hours and hold aspirin 5 days prior to procedure. kr   . CESAREAN SECTION      x 2   . CHOLECYSTECTOMY     .  EYE SURGERY Bilateral     cataract   . HYSTERECTOMY  12/2019   . REPAIR, UPPER EXTREMITY, TENDON & NERVE Right 12/05/2021    Procedure: REPAIR, RIGHT ULNAR NERVE AND NEUROPLASTY @ ELBOW AND WRIST, RIGHT OPEN CARPAL TUNNEL RELEASE;  Surgeon: Cathrine Muster, MD;  Location: [facility];  Service: Plastics;  Laterality: Right;   . UTERINE RUPTURE           Allergies:     Allergies   Allergen Reactions   . Amlodipine Hives   . Bactrim [Sulfamethoxazole-Trimethoprim] Hives, Respiratory Distress and Edema   . Glipizide Edema     Throat swelling   . Lisinopril Edema     Tongue swelling         Medications:     No current facility-administered medications for this encounter.              Prior to Admission medications    Medication Sig Start Date End Date Taking? Authorizing Provider   albuterol sulfate HFA (PROVENTIL) 108 (90 Base) MCG/ACT inhaler Inhale 2 puffs into the lungs every 4 (four) hours as needed for Wheezing 03/18/21  Yes Maurice Small R, [place]   Alcohol Swabs Pads Use as indicated for insulin injections 08/13/20  Yes Anola Gurney, MD   ARIPiprazole (ABILIFY) 5 MG tablet Take 2 tablets (10 mg) by mouth daily 09/24/21  Yes [provider]   aspirin 81 MG chewable tablet Chew 1 tablet (81 mg) by mouth daily 12/25/20  Yes [provider]   atorvastatin (LIPITOR) 40 MG tablet Take 2 tablets (80 mg) by mouth nightly 09/05/21  Yes [provider]   Azelastine-Fluticasone 137-50 MCG/ACT Suspension 1 spray by Nasal route 2 (two) times daily 05/17/21  Yes [provider]   baclofen (LIORESAL) 10 MG  tablet Take 1 tablet (10 mg) by mouth as needed   Yes [provider]   bumetanide (BUMEX) 2 MG tablet Take 1 tablet (2 mg total) by mouth 2 (two) times daily 08/21/20  Yes Pilar Plate, NP   carvedilol (COREG) 25 MG tablet Take 1 tablet (25 mg) by mouth 2 (two) times daily   Yes [provider]   diclofenac (VOLTAREN) 50 MG EC tablet Take 1 tablet (50 mg) by mouth daily 09/05/21  Yes [provider]   diclofenac Sodium (VOLTAREN) 1 % Gel topical gel Apply 2 g topically 4 (four) times daily 02/27/22  Yes Shirke, Tanvi V, DO   docusate sodium (COLACE) 100 MG capsule Take 1 capsule (100 mg) by mouth nightly 09/05/21  Yes [provider]   Elwin Sleight 200-5 MCG/ACT Aerosol Inhale 2 puffs into the lungs daily 09/05/21  Yes [provider]   DULoxetine (CYMBALTA) 60 MG capsule Take 1 capsule (60 mg) by mouth daily 09/24/21  Yes [provider]   EPINEPHrine 0.15 MG/0.3ML injection Inject 0.3 mLs (0.15 mg) into the muscle as needed 09/05/21  Yes [provider]   folic acid (FOLVITE) 1 MG tablet Take 1 tablet (1 mg) by mouth daily   Yes [provider]   hydrALAZINE (APRESOLINE) 50 MG tablet Take 1 tablet (50 mg) by mouth 2 (two) times daily   Yes [provider]   hydrOXYzine (ATARAX) 10 MG tablet Take 1 tablet (10 mg) by mouth every 6 (six) hours as needed for Anxiety   Yes [provider]   insulin detemir (Levemir) 100 UNIT/ML injection Inject 50 Units into the skin every 12 (  twelve) hours  Patient taking differently: Inject 53 Units into the skin every 12 (twelve) hours 08/13/20  Yes Anola Gurney, MD   insulin lispro (HumaLOG) 100 UNIT/ML injection Inject 25 Units into the skin 3 (three) times daily before meals  Patient taking differently: Inject 25 Units into the skin 3 (three) times daily before meals 08/13/20  Yes Anola Gurney, MD   Insulin Syringe-Needle U-100 27G X 1/2" 1 ML Misc Inject 1 Dose into the skin 4 (four)  times daily 08/13/20  Yes Anola Gurney, MD   lidocaine (LIDODERM) 5 % Place 1 patch onto the skin every 24 hours Remove & Discard patch within 12 hours or as directed by MD 04/29/21  Yes Maryella Shivers, MD   meclizine (ANTIVERT) 25 MG tablet Take 1 tablet (25 mg) by mouth as needed   Yes [provider]   metOLazone (ZAROXOLYN) 10 MG tablet Take 1 tablet (10 mg) by mouth daily 12/25/20  Yes [provider]   montelukast (SINGULAIR) 10 MG tablet Take 1 tablet (10 mg) by mouth nightly   Yes [provider]   olmesartan (BENICAR) 40 MG tablet Take 1 tablet (40 mg) by mouth daily   Yes [provider]   omeprazole (PriLOSEC) 40 MG capsule Take 1 capsule (40 mg) by mouth nightly   Yes [provider]   Ozempic, 0.25 or 0.5 MG/DOSE, 2 MG/3ML Solution Pen-injector Inject into the skin Once each week on Sunday morning 02/28/22  Yes [provider]   topiramate (TOPAMAX) 100 MG tablet Take 1 tablet (100 mg) by mouth daily   Yes [provider]   traZODone (DESYREL) 150 MG tablet Take 1 tablet (150 mg) by mouth nightly   Yes [provider]   valACYclovir (VALTREX) 1000 MG tablet Take 1 tablet (1,000 mg) by mouth as needed   Yes [provider]   apixaban (ELIQUIS) 5 MG Take 1 tablet (5 mg) by mouth every 12 (twelve) hours    [provider]   buPROPion XL (WELLBUTRIN XL) 150 MG 24 hr tablet Take 1 tablet (150 mg) by mouth daily 03/29/21   [provider]   semaglutide Reginal Lutes) 1 MG/0.5ML injection Inject 0.5 mLs (1 mg) into the skin once a week  Patient not taking: Reported on 07/02/2022    [provider]     Vitals   Temp:  [36.7 C (98.1 F)] 36.7 C (98.1 F)  Heart Rate:  [94] 94  BP: (127)/(77) 127/77    Wt Readings from Last 3 Encounters:   07/02/22 132.9 kg (293 lb)   05/22/22 127.9 kg (282 lb)   05/01/22 126.3 kg (278 lb 7.1 oz)     BMI (Estimated body mass index is 47.29 kg/m as calculated from the  following:    Height as of 07/02/22: 1.676 m (5\' 6" ).    Weight as of 07/02/22: 132.9 kg (293 lb).)  Temp Readings from Last 3 Encounters:   07/10/22 36.7 C (98.1 F) (Oral)   05/22/22 36.8 C (98.2 F) (Oral)   05/01/22 36.8 C (98.2 F) (Oral)     BP Readings from Last 3 Encounters:   07/10/22 127/77   05/22/22 130/72   05/01/22 (!) 162/98     Pulse Readings from Last 3 Encounters:   07/10/22 94   05/22/22 86   05/01/22 86           Labs:   CBC:  Lab Results   Component  Value Date    WBC 13.04 (H) 02/06/2022    HGB 8.4 (L) 05/22/2022    HCT 27.6 (L) 05/22/2022    PLT 361 (H) 02/06/2022       Chemistries:  Lab Results   Component Value Date    NA 136 02/06/2022    K 4.6 02/06/2022    CL 107 02/06/2022    CO2 19 02/06/2022    BUN 73.0 (H) 02/06/2022    CREAT 3.2 (H) 02/06/2022    GLU 101 (H) 02/06/2022    HGBA1C 9.4 (H) 02/03/2022    MG 2.0 02/05/2022    CA 7.8 (L) 02/06/2022    ALT 8 02/03/2022    AST 7 02/03/2022       Coags:  Lab Results   Component Value Date    PT 14.0 (H) 02/03/2022    PTT 41 (H) 02/03/2022    INR 1.2 (H) 02/03/2022     _____________________      Signed by: Leilani Able, MD  07/10/22   8:45 AM    =============================================================

## 2022-07-10 NOTE — Progress Notes (Signed)
Fall Precautions interventions include the following due to sedation/anesthesia for procedures:    -Physical environment is free of clutter  -The patient is oriented to the environment  -Family members may be at the bedside when appropriate  -Stretchers are in the lowest position with wheels locked  -Non-skid socks are provided as foot covers  -Staff members assist the patient with ambulation to the bathroom  -Call bell in reach if staff member is away

## 2022-07-10 NOTE — Transfer of Care (Signed)
Anesthesia Transfer of Care Note    Patient: [name]    Procedures performed: Procedure(s):  LEFT ULNAR NERVE NEUROPLASTY AT ELBOW AND WRIST, LEFT OPEN CARPAL TUNNEL RELEASE    Anesthesia type: General ETT    Patient location:Phase I PACU    Last vitals:   Vitals:    07/10/22 1113   BP: 156/71   Pulse: 81   Resp: 13   Temp: 36.4 C (97.6 F)   SpO2: 94%       Post pain: Patient not complaining of pain, continue current therapy      Mental Status:awake    Respiratory Function: tolerating face mask    Cardiovascular: stable    Nausea/Vomiting: patient not complaining of nausea or vomiting    Hydration Status: adequate    Post assessment: no apparent anesthetic complications, no reportable events and no evidence of recall    Signed by: Audrie Gallus, CRNA  07/10/22 11:13 AM

## 2022-07-10 NOTE — Brief Op Note (Signed)
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  Released both nerves and vascularized fascial flap to the thenar region for perfusion to the nerve and ulnar bundle and reduction of scarring.  Tourniquet at 57 minutes at    Anesthesia:   General    Estimated Blood Loss:    * No values recorded between 07/10/2022  9:37 AM and 07/10/2022 11:07 AM *    Implants:   * No implants in log *    Drains:   Drains: no    Specimens:   * No specimens in log *     Complications:   None     Signed by: Cathrine Muster, MD                                                                           [facility]

## 2022-07-10 NOTE — Op Note (Addendum)
FULL OPERATIVE NOTE    Date Time: 07/10/22 11:10 AM  Patient Name: [name], [name] (MRN: [id number])  Attending Physician: Cathrine Muster, MD    Date of Operation:   07/10/2022    Providers Performing:   Surgeon(s) and Role:     * Cathrine Muster, MD - Primary    Surgical First Assistant(s):   First Assistant: Weston Settle R    Operative Procedure:   Neuroplasty ulnar nerve at left elbow [phone])  Medial epicondylectomy at left elbow (95621)  Neuroplasty ulnar nerve at [facility] canal (30865)  Vascularized hypothenar fascial and fat flap to the median nerve to prevent recurrence and scarring (78469)  Left open carpal tunnel release (62952)  Creation of custom short arm splint (84132)    Preoperative Diagnosis:   Pre-Op Diagnosis Codes:     * 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  Released both nerves and vascularized fascial flap to the thenar region for perfusion to the nerve and ulnar bundle and reduction of scarring.  Tourniquet at 57 minutes at    Indications:   Median and ulnar neuropathy with worsening pain     Operative Notes:   The patient was seen and appropriately consented in the preoperative area.  Plan for incisions at her elbow as well as the volar wrist were agreed upon.  She was taken to the operating theater.  Timeout procedure was performed.  She was appropriately intubated. The left upper extremity was fully extended on an arm board.  The arm was then prepped and draped to the level of the axilla.   A sterile tourniquet was placed proximally on the upper arm.  After exsanguination with an Esmarch, the tourniquet was inflated to 250 mmHg.      The elbow was then flexed and externally rotated to allow for access to the  inferior medial portion.  A 15-blade scalpel was used to incise a curvilinear incision just distal and proximal to the medial epicondyle.  Sharp dissection through the subcutaneous tissues was performed until the ulnar nerve was identified proximal to the elbow within the muscular fascia.  This was traced to the level of the medial epicondyle which was significantly inflamed.  There was clear compression of the ulnar nerve across this segment and release of the ligament provided significant improvement in immediate compression of the nerve.  Distal neurolysis was also performed for assurance of no fascial compression proximally more distal to the elbow.  Inspection of the medial epicondyle revealed a highly sharp and fragmented portion consistent with medial epicondylitis.  This was trimmed away with an osteotome for significant smoothing of the bony and cartilaginous portion.   The wound was copiously irrigated and the skin was closed with interrupted and running Monocryl suture.       Attention was then paid towards the volar wrist portion.  A curvilinear incision was made over the expected position of the hook of the hamate bone on the ulnar volar palm.  Sharp dissection through the subcutaneous tissues was performed with a 15-blade scalpel until the transverse carpal ligament was identified.  This was divided with a fresh blade scalpel  under direct visualization.  Once entering the carpal tunnel, a Therapist, nutritional was used for retraction of the contents in a radial direction for protection of the nerve and flexor tendons.  Completion release of the entire transverse carpal ligament proximally and distally was performed for exposure of the entire area.  Attention was then paid towards the ulnar portion.  The ulnar neurovascular bundle was identified distal to Guyon's canal and a retrograde neurolysis was performed of the ulnar nerve until the bifurcation between the sensory and motor component.  The sensory was  completely released from all surrounding fibrinous structures.  The posterior aspect of the hypothenar eminence was also released and division of the posterior fascia with a tenotomy scissors was performed with significant reduction and compression of the the motor fascicles.  A vascularized flap was then elevated from a branch of the ulnar artery in the hypothenar eminence and islandized and rotated into the divided transverse carpal ligament and assured to be perfused and inset with interrupted sutures.     This wound was copiously irrigated.  The skin was closed with interrupted 4-0 nylon suture in standard fashion.  Injection of 30 mL of 0.5% Marcaine without epinephrine was then performed for ulnar and median nerve block both proximal to the elbow and at the level of the wrist for reduction of complex regional pain syndrome flare.  Xeroform was placed on both incisions, circumferential Kerlix, circumferential Webril, and a short-arm forearm based fiberglass flexion blocking splint was created prior to wrapping with Coban and deflation of the tourniquet, which was at 57 minutes.  She tolerated this with no difficulties and was awakened and taken to recovery in stable condition with perfusion to all fingers.    Estimated Blood Loss:   * No values recorded between 07/10/2022  9:37 AM and 07/10/2022 11:07 AM *    Implants:   * No implants in log *    Drains:   Drains: no    Specimens:   * No specimens in log *    Complications:   None    Signed by: Cathrine Muster, MD  [facility]

## 2022-07-16 ENCOUNTER — Encounter: Payer: Self-pay | Admitting: Plastic Surgery

## 2022-07-17 ENCOUNTER — Telehealth: Payer: Self-pay

## 2022-07-17 NOTE — Telephone Encounter (Signed)
I called the patient concerning a new patient appointment. I reviewed with the patient I have received her referral from Dr. Louanne Skye  and I was having her chart reviewed by the Nurse Navigator for appropriate scheduling time frame. The patient has expressed understanding.

## 2022-07-17 NOTE — Provider Clarification Note (Signed)
Was LEFT side

## 2022-07-30 ENCOUNTER — Encounter (INDEPENDENT_AMBULATORY_CARE_PROVIDER_SITE_OTHER): Payer: Self-pay

## 2022-08-01 ENCOUNTER — Emergency Department
Admission: EM | Admit: 2022-08-01 | Discharge: 2022-08-01 | Disposition: A | Discharge status: Home / Self Care | Payer: BC Managed Care – PPO | Attending: Emergency Medicine | Admitting: Emergency Medicine

## 2022-08-01 ENCOUNTER — Emergency Department: Payer: BC Managed Care – PPO

## 2022-08-01 DIAGNOSIS — T8149XA Infection following a procedure, other surgical site, initial encounter: Secondary | ICD-10-CM | POA: Insufficient documentation

## 2022-08-01 DIAGNOSIS — Y838 Other surgical procedures as the cause of abnormal reaction of the patient, or of later complication, without mention of misadventure at the time of the procedure: Secondary | ICD-10-CM | POA: Insufficient documentation

## 2022-08-01 LAB — CBC AND DIFFERENTIAL
Absolute NRBC: 0.02 10*3/uL — ABNORMAL HIGH (ref 0.00–0.00)
Basophils Absolute Automated: 0.08 10*3/uL (ref 0.00–0.08)
Basophils Automated: 0.6 %
Eosinophils Absolute Automated: 0.54 10*3/uL — ABNORMAL HIGH (ref 0.00–0.44)
Eosinophils Automated: 3.8 %
Hematocrit: 28.6 % — ABNORMAL LOW (ref 34.7–43.7)
Hgb: 8.5 g/dL — ABNORMAL LOW (ref 11.4–14.8)
Immature Granulocytes Absolute: 0.59 10*3/uL — ABNORMAL HIGH (ref 0.00–0.07)
Immature Granulocytes: 4.2 %
Instrument Absolute Neutrophil Count: 8.62 10*3/uL — ABNORMAL HIGH (ref 1.10–6.33)
Lymphocytes Absolute Automated: 3.21 10*3/uL (ref 0.42–3.22)
Lymphocytes Automated: 22.8 %
MCH: 25.8 pg (ref 25.1–33.5)
MCHC: 29.7 g/dL — ABNORMAL LOW (ref 31.5–35.8)
MCV: 86.9 fL (ref 78.0–96.0)
MPV: 10 fL (ref 8.9–12.5)
Monocytes Absolute Automated: 1.05 10*3/uL — ABNORMAL HIGH (ref 0.21–0.85)
Monocytes: 7.5 %
Neutrophils Absolute: 8.62 10*3/uL — ABNORMAL HIGH (ref 1.10–6.33)
Neutrophils: 61.1 %
Nucleated RBC: 0.1 /100 WBC — ABNORMAL HIGH (ref 0.0–0.0)
Platelets: 376 10*3/uL — ABNORMAL HIGH (ref 142–346)
RBC: 3.29 10*6/uL — ABNORMAL LOW (ref 3.90–5.10)
RDW: 13 % (ref 11–15)
WBC: 14.09 10*3/uL — ABNORMAL HIGH (ref 3.10–9.50)

## 2022-08-01 LAB — BASIC METABOLIC PANEL
Anion Gap: 5 (ref 5.0–15.0)
BUN: 72 mg/dL — ABNORMAL HIGH (ref 7.0–21.0)
CO2: 19 mEq/L (ref 17–29)
Calcium: 8 mg/dL — ABNORMAL LOW (ref 8.5–10.5)
Chloride: 109 mEq/L (ref 99–111)
Creatinine: 2.4 mg/dL — ABNORMAL HIGH (ref 0.4–1.0)
Glucose: 346 mg/dL — ABNORMAL HIGH (ref 70–100)
Potassium: 5.8 mEq/L — ABNORMAL HIGH (ref 3.5–5.3)
Sodium: 133 mEq/L — ABNORMAL LOW (ref 135–145)
eGFR: 25.1 mL/min/{1.73_m2} — AB (ref >=60)

## 2022-08-01 MED ORDER — DOXYCYCLINE HYCLATE 100 MG PO TABS
100.0000 mg | ORAL_TABLET | Freq: Two times a day (BID) | ORAL | 0 refills | Status: AC
Start: 2022-08-01 — End: 2022-08-08

## 2022-08-01 MED ORDER — HYDROCODONE-ACETAMINOPHEN 5-325 MG PO TABS
1.0000 | ORAL_TABLET | Freq: Once | ORAL | Status: AC
Start: 2022-08-01 — End: 2022-08-01
  Administered 2022-08-01: 1 via ORAL
  Filled 2022-08-01: qty 1

## 2022-08-01 MED ORDER — AMPICILLIN-SULBACTAM 3 GM MBP (CNR)
3.0000 g | Freq: Once | Status: AC
Start: 2022-08-01 — End: 2022-08-01
  Administered 2022-08-01: 3 g via INTRAVENOUS
  Filled 2022-08-01: qty 1

## 2022-08-01 NOTE — EDIE (Signed)
[facility]?NOTIFICATION?08/01/2022 05:43?[name], [name]?MRN: [id number]    Criteria Met      5 ED Visits in 12 Months    Security and Safety  No Security Events were found.  ED Care Guidelines  There are currently no ED Care Guidelines for this patient. Please check your facility's medical records system.        Prescription Monitoring Program  270??- Narcotic Use Score  271??- Sedative Use Score  000??- Stimulant Use Score  260??- Overdose Risk Score  - All Scores range from 000-999 with 75% of the population scoring < 200 and on 1% scoring above 650  - The last digit of the narcotic, sedative, and stimulant score indicates the number of active prescriptions of that type  - Higher Use scores correlate with increased prescribers, pharmacies, mg equiv, and overlapping prescriptions  - Higher Overdose Risk Scores correlate with increased risk of unintentional overdose death   Concerning or unexpectedly high scores should prompt a review of the PMP record; this does not constitute checking PMP for prescribing purposes.    E.D. Visit Count (12 mo.)  Facility Visits   [facility] 1   [facility] - Visits 1   [facility] 6   [facility] 1   [place] [facility] 1   [place] [facility] 1   Total 11   Note: Visits indicate total known visits.     Recent Emergency Department Visit Summary  Showing 10 most recent visits out of 11 in the past 12 months   Date Facility [facility] Type Diagnoses or Chief Complaint    Aug 01, 2022  [place] [facility]  [place]  [place]  Emergency      Hand Pain      May 01, 2022  [place] [facility] [facility]  [place]  [place]  Emergency      Strain of unspecified muscle, fascia and tendon at shoulder and upper arm level, right arm, initial encounter      Arm Pain      Triage: Pain in r arm      Apr 02, 2022  [place] [facility] [place] [facility]  [place]  [place]  Emergency      Pain in left leg      Leg Pain      Triage: Left leg  pain/Possible blood clot      Mar 05, 2022  [facility]  [place]  [place]  Emergency  Chief Complaint: EMs: Near syncope    Mar 05, 2022  [place] [facility]  [place]  Emergency      Conjunctival hemorrhage, right eye      Furuncle of head [any part, except face]      Edema of right eye, unspecified eyelid      Facial Swelling      Swollen Eye      Feb 24, 2022  [place] [facility] [place] [facility]  [place]  [place]  Emergency      Ganglion, right hand      Other specified soft tissue disorders      Pain in right arm      Blood Clot Rule Out      Possible blod clot in habd- kidney patient      Feb 02, 2022  [facility] [place] [facility]  [place]  [place]  Emergency      Acute kidney failure, unspecified  Otalgia      Knee Pain      Back Pain      Neck, back , Flank pain; Fever; leg pain; Hx of Blood clot      Dec 17, 2021  [place]  [facility] [place] [facility]  [place]  [place]  Emergency      Encounter for other specified surgical aftercare      Other specified symptoms and signs involving the circulatory and respiratory systems      Pain in right arm      Post-op Problem      Wrist Pain      wound check post surgery      Oct 05, 2021  [place] [facility] [place] [facility]  [place]  [place]  Emergency      Essential (primary) hypertension      Viral infection, unspecified      Disorder of kidney and ureter, unspecified      Shortness of Breath      Chest Pain      triage - cp, sob, edema      triage      Oct 01, 2021  [place] [facility] [place] [facility]  [place]  [place]  Emergency      Lesion of ulnar nerve, right upper limb      Strain of unspecified muscle, fascia and tendon at wrist and hand level, right hand, initial encounter      Unspecified fall, initial encounter      Arm Injury      Pinched Nerve Right side/Stiff        Recent Inpatient Visit Summary  Date Facility [facility] Type Diagnoses or Chief Complaint    Feb 02, 2022  [facility] [place] [facility]  [place]  [place]  Medical Surgical      Acute kidney failure, unspecified        Care Team  Provider Specialty Phone Fax  Service Dates   Council, Marylu Lund , MD Internal Medicine [phone] Current      [facility]  This patient has registered at the [facility] Emergency Department   For more information visit: [email or web address]     PLEASE NOTE:     1.   Any care recommendations and other clinical information are provided as guidelines or for historical purposes only, and providers should exercise their own clinical judgment when providing care.    2.   You may only use this information for purposes of treatment, payment or health care operations activities, and subject to the limitations of applicable [facility] Policies.    3.   You should consult directly with the organization that provided a care guideline or other clinical history with any questions about additional information or accuracy or completeness of information provided.    ? 2023 [facility] - [email or web address]

## 2022-08-01 NOTE — ED Triage Notes (Signed)
Pt to ED with redness, pulsatile pain, swelling and purulent drainage from surgical incision on her L hand. Pt had surgery on 8/31 for carpal tunnel and believes site is infected. Pt had similar surgery on other side 6 months ago and that also got infected and she says this experience feels the same.

## 2022-08-01 NOTE — ED Provider Notes (Signed)
History     Chief Complaint   Patient presents with    Post-op Problem    Wound Infection     Patient presents to the emergency room with increasing redness and swelling over the left wrist.  Patient had a carpal tunnel surgery performed 3 weeks ago and states that she has had a history of infections in the past.  She states that the redness and pain became worse overnight and she did not feel that she could wait to go to see her regular physician so decided to come to the emergency room.  She has had a small amount of purulent discharge.  She denies fevers or chills.  She states that her blood sugars have been well controlled in the 120s.    The history is provided by the patient.        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 kr    CESAREAN SECTION      x 2    CHOLECYSTECTOMY      EYE SURGERY Bilateral     cataract    HYSTERECTOMY  12/2019    RELEASE, CARPAL TUNNEL Left 07/10/2022    Procedure: RELEASE, CARPAL TUNNEL;  Surgeon: Cathrine Muster, MD;  Location: [facility];  Service: Plastics;  Laterality: Left;    REPAIR, UPPER EXTREMITY, TENDON & NERVE Right 12/05/2021    Procedure:  REPAIR, RIGHT ULNAR NERVE AND NEUROPLASTY @ ELBOW AND WRIST, RIGHT OPEN CARPAL TUNNEL RELEASE;  Surgeon: Cathrine Muster, MD;  Location: [facility];  Service: Plastics;  Laterality: Right;    REPAIR, UPPER EXTREMITY, TENDON & NERVE Left 07/10/2022    Procedure: LEFT ULNAR NERVE NEUROPLASTY AT ELBOW AND WRIST, LEFT OPEN CARPAL TUNNEL RELEASE;  Surgeon: Cathrine Muster, MD;  Location: [facility];  Service: Plastics;  Laterality: Left;    UTERINE RUPTURE         Family History   Problem Relation Age of Onset    Heart disease Mother     Hypertension Mother     Diabetes Mother     Asthma Father     Hypertension Father     Diabetes Daughter     Asthma Son     Cancer Maternal Aunt     Cancer  Maternal Grandmother        Social  Social History     Tobacco Use    Smoking status: Never    Smokeless tobacco: Never   Vaping Use    Vaping Use: Never used   Substance Use Topics    Alcohol use: Never    Drug use: Never       .     Allergies   Allergen Reactions    Amlodipine Hives    Bactrim [Sulfamethoxazole-Trimethoprim] Hives, Respiratory Distress and Edema    Glipizide Edema     Throat swelling    Lisinopril Edema     Tongue swelling       Home Medications               albuterol sulfate HFA (PROVENTIL) 108 (90 Base) MCG/ACT inhaler     Inhale 2 puffs into the lungs every 4 (four) hours as needed for Wheezing     Alcohol Swabs Pads     Use as indicated for insulin injections     apixaban (ELIQUIS) 5 MG     Take 1 tablet (5 mg) by mouth every 12 (twelve) hours     ARIPiprazole (ABILIFY) 5 MG tablet     Take 2 tablets (10 mg) by mouth daily     aspirin 81 MG chewable tablet     Chew 1 tablet (81 mg) by mouth daily     atorvastatin (LIPITOR) 40 MG tablet     Take 2 tablets (80 mg) by mouth nightly     Azelastine-Fluticasone 137-50 MCG/ACT Suspension     1 spray by Nasal route 2 (two) times daily     baclofen (LIORESAL) 10 MG tablet     Take 1 tablet (10 mg) by mouth as needed     bumetanide (BUMEX) 2 MG tablet      Take 1 tablet (2 mg total) by mouth 2 (two) times daily     buPROPion XL (WELLBUTRIN XL) 150 MG 24 hr tablet     Take 1 tablet (150 mg) by mouth daily     carvedilol (COREG) 25 MG tablet     Take 1 tablet (25 mg) by mouth 2 (two) times daily     diclofenac (VOLTAREN) 50 MG EC tablet     Take 1 tablet (50 mg) by mouth daily     diclofenac Sodium (VOLTAREN) 1 % Gel topical gel     Apply 2 g topically 4 (four) times daily     docusate sodium (COLACE) 100 MG capsule     Take 1 capsule (100 mg) by mouth nightly     Dulera 200-5 MCG/ACT Aerosol     Inhale 2 puffs into the lungs daily     DULoxetine (CYMBALTA) 60 MG capsule     Take 1 capsule (60 mg) by mouth daily     EPINEPHrine 0.15 MG/0.3ML injection     Inject 0.3 mLs (0.15 mg) into the muscle as needed     folic acid (FOLVITE) 1 MG tablet     Take 1 tablet (1 mg) by mouth daily     hydrALAZINE (APRESOLINE) 50 MG tablet     Take 1 tablet (50 mg) by mouth 2 (two) times daily     hydrOXYzine (ATARAX) 10 MG tablet     Take 1 tablet (10 mg) by mouth every 6 (six) hours as needed for Anxiety     insulin detemir (Levemir) 100 UNIT/ML injection  Inject 50 Units into the skin every 12 (twelve) hours     Patient taking differently: Inject 53 Units into the skin every 12 (twelve) hours     insulin lispro (HumaLOG) 100 UNIT/ML injection     Inject 25 Units into the skin 3 (three) times daily before meals     Patient taking differently: Inject 25 Units into the skin 3 (three) times daily before meals     Insulin Syringe-Needle U-100 27G X 1/2" 1 ML Misc     Inject 1 Dose into the skin 4 (four) times daily     lidocaine (LIDODERM) 5 %     Place 1 patch onto the skin every 24 hours Remove & Discard patch within 12 hours or as directed by MD     meclizine (ANTIVERT) 25 MG tablet     Take 1 tablet (25 mg) by mouth as needed     metOLazone (ZAROXOLYN) 10 MG tablet     Take 1 tablet (10 mg) by mouth daily     montelukast (SINGULAIR) 10 MG tablet     Take 1 tablet (10 mg) by mouth  nightly     olmesartan (BENICAR) 40 MG tablet     Take 1 tablet (40 mg) by mouth daily     omeprazole (PriLOSEC) 40 MG capsule     Take 1 capsule (40 mg) by mouth nightly     Ozempic, 0.25 or 0.5 MG/DOSE, 2 MG/3ML Solution Pen-injector     Inject into the skin Once each week on Sunday morning     semaglutide V [facility]) 1 MG/0.5ML injection     Inject 0.5 mLs (1 mg) into the skin once a week     Patient not taking: Reported on 07/02/2022     topiramate (TOPAMAX) 100 MG tablet     Take 1 tablet (100 mg) by mouth daily     traZODone (DESYREL) 150 MG tablet     Take 1 tablet (150 mg) by mouth nightly     valACYclovir (VALTREX) 1000 MG tablet     Take 1 tablet (1,000 mg) by mouth as needed             Review of Systems   Constitutional:  Negative for chills and fever.   Musculoskeletal:  Positive for joint swelling.       PCP: Alyson Locket, DO    Physical Exam    BP: 162/79, Heart Rate: 84, Temp: 98.1 F (36.7 C), Resp Rate: 18, SpO2: 100 %, Weight: 134.3 kg    Physical Exam  Vitals and nursing note reviewed.   Constitutional:       General: She is awake.      Appearance: Normal appearance.   HENT:      Head: Normocephalic and atraumatic.   Eyes:      General: Vision grossly intact.      Conjunctiva/sclera: Conjunctivae normal.   Neck:      Trachea: Trachea normal.   Cardiovascular:      Rate and Rhythm: Normal rate and regular rhythm.      Pulses:           Radial pulses are 2+ on the right side and 2+ on the left side.   Pulmonary:      Effort: Pulmonary effort is normal.      Breath sounds: Normal breath sounds.   Chest:      Chest wall: No deformity or tenderness.   Abdominal:  General: Abdomen is flat.      Palpations: Abdomen is soft.      Tenderness: There is no abdominal tenderness.   Musculoskeletal:      Cervical back: Normal range of motion and neck supple.      Right lower leg: No edema.      Left lower leg: No edema.      Comments: Left wrist wound with some surrounding swelling, minimal erythema, small  amount of clear discharge from the center.  Pain with passive range of motion.  Neurovascular intact   Skin:     General: Skin is warm.      Capillary Refill: Capillary refill takes less than 2 seconds.      Coloration: Skin is not jaundiced or pale.   Neurological:      Mental Status: She is alert. Mental status is at baseline.      Motor: No weakness.      Gait: Gait is intact.   Psychiatric:         Mood and Affect: Mood and affect normal.         Speech: Speech normal.           MDM and ED Course     ED Medication Orders (From admission, onward)      Start Ordered     Status Ordering Provider    08/01/22 [phone] 08/01/22 0701  ampicillin-sulbactam (UNASYN) 3 g in sodium chloride 0.9% 100 mL mini-bag plus  Once        Route: Intravenous  Ordered Dose: 3 g       Last MAR action: Given Dracen Reigle    08/01/22 0616 08/01/22 0615  HYDROcodone-acetaminophen (NORCO) 5-325 MG per tablet 1 tablet  Once        Route: Oral  Ordered Dose: 1 tablet       Last MAR action: Given Noheli Melder               Medical Decision Making  Patient presents to the emergency room with increasing swelling and discharge from her incision site from carpal tunnel repair 3 weeks ago.  Here she has tenderness to the area and elevated white blood cell count.  Wound was cultured.  Discussed the case with her plastic surgeon who states that they can get her in for urgent follow-up.  Patient was given a dose of Unasyn here and we will plan outpatient doxycycline.  She understands to return to the emergency room for increasing pain, swelling, fevers, or other concerns.  I met with the patient and discussed their results, discharge diagnoses, and need for further follow-up.  Patient and their family had an opportunity to ask questions and all of their concerns were addressed.  They understand the importance of continued follow-up as indicated.    This chart was generated in a busy and noisy emergency room using voice recognition software.  All  charts are carefully checked and monitored for transcription errors, however due to the nature of this practice environment errors may occasionally occur.    Amount and/or Complexity of Data Reviewed  Labs: ordered. Decision-making details documented in ED Course.  Radiology: ordered. Decision-making details documented in ED Course.    Risk  Prescription drug management.          ED Course as of 08/01/22 2248   Fri Aug 01, 2022   0643 WBC(!): 14.09 [MC]   0644 Wrist Left PA Lateral and Oblique  Soft tissue swelling, no evidence  of fracture or gas-forming organisms.  Independently interpreted by myself, awaiting formal radiology read. [MC]      ED Course User Index  [MC] Lorrin Jackson, MD             I am the first provider for this patient.    I reviewed the patient's past medical history, surgical history, medication list, and previous relevant ED visits and admissions.    I reviewed the patient's vital signs during their visit.  No data found.      Clinical Impression & Disposition     Clinical Impression  Final diagnoses:   Wound infection after surgery        ED Disposition       ED Disposition   Discharge    Condition   --    Date/Time   Fri Aug 01, 2022  7:13 AM    Comment   [name] discharge to home/self care.    Condition at disposition: Stable                  Discharge Medication List as of 08/01/2022  7:14 AM        START taking these medications    Details   doxycycline (VIBRA-TABS) 100 MG tablet Take 1 tablet (100 mg) by mouth 2 (two) times daily for 7 days, Starting Fri 08/01/2022, Until Fri 08/08/2022, E-Rx                 Lorrin Jackson, MD  08/01/22 2248

## 2022-08-03 NOTE — Progress Notes (Signed)
Patient prescribed doxycycline.  No antibiotic change needed.

## 2022-08-25 ENCOUNTER — Emergency Department: Payer: BC Managed Care – PPO

## 2022-08-25 ENCOUNTER — Emergency Department
Admission: EM | Admit: 2022-08-25 | Discharge: 2022-08-25 | Disposition: A | Discharge status: Home / Self Care | Payer: BC Managed Care – PPO | Attending: Student in an Organized Health Care Education/Training Program | Admitting: Student in an Organized Health Care Education/Training Program

## 2022-08-25 DIAGNOSIS — S8002XA Contusion of left knee, initial encounter: Secondary | ICD-10-CM | POA: Insufficient documentation

## 2022-08-25 MED ORDER — TRAMADOL HCL 50 MG PO TABS
50.0000 mg | ORAL_TABLET | Freq: Four times a day (QID) | ORAL | 0 refills | Status: AC | PRN
Start: 2022-08-25 — End: 2022-09-01
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
## 2022-08-26 ENCOUNTER — Inpatient Hospital Stay: Payer: BC Managed Care – PPO | Attending: Plastic Surgery | Admitting: Rehabilitative and Restorative Service Providers"

## 2022-08-26 DIAGNOSIS — M79642 Pain in left hand: Secondary | ICD-10-CM | POA: Insufficient documentation

## 2022-08-26 DIAGNOSIS — R29898 Other symptoms and signs involving the musculoskeletal system: Secondary | ICD-10-CM | POA: Insufficient documentation

## 2022-08-26 DIAGNOSIS — M79641 Pain in right hand: Secondary | ICD-10-CM | POA: Insufficient documentation

## 2022-08-26 NOTE — Progress Notes (Signed)
Name:[name] Age: 41 y.o.   Date of Service: 08/26/2022  Referring Physician: Cathrine Muster, MD   Date of Injury: 07/10/2022  Date Care Plan Established/Reviewed: 08/26/2022  Date Treatment Started: 08/26/2022  End of Certification Date: 11/23/2022  Sessions in Plan of Care: 10  Surgery Date: 07/10/2022  MD Follow-up: No data was found  Medbridge Code: No data was found    Visit Count: 1   Diagnosis:    Diagnosis ICD-10-CM Associated Order   1. Bilateral hand pain  M79.641     M79.642       2. Weakness of both hands  R29.898       S/p CTR    Subjective     History of Present Illness   History of Present Illness: Pt reports having CTR and cubital tunnel release on the right 12/2021 and then had CTR and cubital tunnel release on the left. Pt followed up on 08/06/22 and referred to therapy. Reports shakiness when holding onto things but did not have it prior to any sx. Reports both incisions were infected .  Functional Limitations (PLOF): PLOF: independent with ADLs with no pain or limitations. Present issues due to injury: opening jars , cutting food, hard to put bra on in the back, carrying groceries, turning the wheel when driving, writing, typing    Patient's goal for therapy: to have strength in the hands    Outcome Measure   Tool Used/Details: FOTO  Score: 47  Predicted Functional Outcome: 60    Daily Subjective   Reports pain is equal in both hands.    Pain   Current pain rating: 6  At best pain rating: 4  At worst pain rating: 8    Social Support/Occupation    Lives in: multiple level home    Lives with: adult children    Occupation: on disability      Precautions: No data was found  Allergies: Amlodipine, Bactrim [sulfamethoxazole-trimethoprim], Glipizide, and Lisinopril    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     Diabetes mellitus      Fibromyalgia     Gastroesophageal reflux disease     Hyperlipidemia     Hypertension     Neuropathy     Pulmonary embolism 08/2019    Renal insufficiency     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 Reviewed past medical history, allergies and medications with patient and  completed forms are scanned into Epic.    Other Treatment/Prior Therapy: No  Prior Hospitalization: none recent  Hand Dominance:   R Involved Side:   R =L      OBJECTIVE:  Vitals: 168/101 BP  Observation/Posture/Gait/Integumentary  Observation: tight and dry skin at wrist, tremor on the left with ROM but  the right when using it (Texting)   Girth: None noted, jewelry fitting ok  Integumentary: Scar mobility:  tight and tender  at wrists and   Palpation: Pain to palpation: left CTR scar  Special Tests: none  Sensation: reports light touch on the right but tingling and improved since sx more on the median nerve digits, same for left but the ulnar digits are worse with the tingling more intense  Range of Motion Measurements:    R  L   Active Passive  Active Passive     ELBOW & FOREARM     65  Supination 75    85  Pronation 85      Elbow Extension       Elbow Flexion       WRIST     60  Extension 60    65  Flexion 60      Ulnar Deviation       Radial Deviation              THUMB       MP Flexion       IP Flexion       Radial Abduction     To base of 5th  Opposition To base of 5th     (blank fields were intentionally left blank)  R       L  Index Long Ring Little Fingers (Active Index Long Ring Little       MP Flexion           PIP Flexion           DIP Flexion       (blank fields were intentionally left blank) stiffness in fingers with claw fist    Strength Measurements:   R Pinch  Strength L  R Grip Strength L    Lateral   40,40 II 20,25   10,10 3 Point 8,10        2 Point        (blank fields were intentionally left blank)     Treatment     Therapeutic Exercises - Justified to address any of the following:  To develop strength, endurance, ROM and/or flexibility.   Initiated HEP and provided cues so that pt can perform properly  Refer to handout/media for details  3 pt pinch with clip  Squeezing ball  on and off    Access Code: [email or web address]  URL: [email or web address]  Date: 08/26/2022  Prepared by: Dahlia Client    Exercises  - Seated Wrist Flexion with Overpressure  - 1 x daily - 7 x weekly - 10 reps - 30 hold  - Wrist Extension Stretch Pronated  - 1 x daily - 7 x weekly - 3 sets - 10 reps  - Wrist Prayer Stretch  - 7 x weekly - 3 reps - 30 hold - 1 prayer stretch  - Seated Claw Fist AROM  - 1 x daily - 7 x weekly - 10 reps  - Resisted Finger Extension and Thumb Abduction  - 1 x daily - 7 x weekly - 10 reps    Manual Therapy - Justified to address any of the following:    Mobilization of joints and soft tissues, manipulation, manual lymphatic drainage, and/or manual traction.    STM to volar scars- use of golf ball at home   Manual stretch in flexion bilaterally    Therapeutic Activity - Justified to address the following:  Dynamic activities to improve functional performance.  Pt was educated on pathology, diagnosis and role of OT  Use of silicone get sheets for scars       ---      Flowsheet Row ---   Total Time    Timed Minutes 28 minutes   Untimed Minutes 10 minutes   Total Time 38 minutes          Assessment   [name] is a 41 y.o. female presenting with bilateral hand pain and weakness who requires Occupational Therapy for the following:  Impairments: pain, decreased ROM and strength that interferes with functional tasks      Clinical presentation: stable as expected for diagnosis with no complications    Barriers to therapy: Time since onset of  injury/illness/exacerbation over 10 years of symptoms  Comorbidities infections of incisions,complex PMHx  Functional Limitations (PLOF): PLOF: independent with ADLs with no pain or limitations. Present issues due to injury: opening jars , cutting food, hard to put bra on in the back, carrying groceries, turning the wheel when driving, writing, typing    Patient's goal for therapy: to have strength in the hands  Prognosis: good  Patient is aware of diagnosis, prognosis and consents to plan of care: Yes  Plan   Visits per week: 2  Number of Sessions: 12  Direct One on One  16109: Therapeutic Exercise: To Develop Strength and Endurance, ROM and Flexibility  97140: Manual Therapy techniques (mobilization, manipulation, manual traction) (grade 1-4)  97530: Therapeutic Activities: Dynamic activities to improve functional performance  60454: Ultrasound  Supervised Modalities  97018: Paraffin bath    Goals      Goal 1: Increase grip strength to 50 rt  and 30 left pounds to allow patient to open a jar.   Sessions: 12      Goal 2: Increase 3 pt  pinch strength to 12 pounds to assist with ADLs.   Sessions: 10      Goal 3: Patient will demonstrate independence in prescribed HEP with proper form, sets and reps for safe discharge to an independent program.   Sessions: 12      Goal 4: Increase AROM wrist flexion 65 degrees  left to assist reaching into back pocket   Sessions: 12          Goal 5: Pt will have less to no tremors when completing ADl tasks   Sessions: 12                           Ramon Dredge, OTR/CHT

## 2022-09-01 ENCOUNTER — Inpatient Hospital Stay: Payer: BC Managed Care – PPO | Admitting: Rehabilitative and Restorative Service Providers"

## 2022-09-16 ENCOUNTER — Inpatient Hospital Stay: Payer: BC Managed Care – PPO | Admitting: Rehabilitative and Restorative Service Providers"

## 2022-09-18 ENCOUNTER — Inpatient Hospital Stay: Payer: BC Managed Care – PPO | Attending: Plastic Surgery | Admitting: Rehabilitative and Restorative Service Providers"

## 2022-09-18 DIAGNOSIS — R29898 Other symptoms and signs involving the musculoskeletal system: Secondary | ICD-10-CM | POA: Insufficient documentation

## 2022-09-18 NOTE — PT/OT Therapy Note (Signed)
Name: [name] Age: 41 y.o.   Date of Service: 09/18/2022  Referring Physician: Cathrine Muster, MD   Date of Injury: 07/10/2022  Date Care Plan Established/Reviewed: 08/26/2022  Date Treatment Started: 08/26/2022  End of Certification Date: 11/23/2022  Sessions in Plan of Care: 10  Surgery Date: 07/10/2022  MD Follow-up: No data was found  Medbridge Code: No data was found    Visit Count: 2   Diagnosis:    Diagnosis ICD-10-CM Associated Order   1. Weakness of both hands  R29.898                Subjective     Daily Subjective   Reports cooking a little more and not afraid to use the hands. 0/10 on right and 4/10 on left    Pain   Current pain rating: 4    Social Support/Occupation    Lives in: multiple level home    Lives with: adult children    Occupation: on disability           Precautions: No data was found  Allergies: Amlodipine, Bactrim [sulfamethoxazole-trimethoprim], Glipizide, and Lisinopril    Objective            AROM        Wrist extension 70  (inc 10) left vs 65 ( inc 5)degrees rt  Grip 65,70#  (inc 30) vs 55,60# left ( inc 35)    Treatment     Therapeutic Exercises - Justified to address any of the following:  To develop strength, endurance, ROM and/or flexibility.   Wrist extension stretch on side of table x 3 30 sec  Rubber band extension 2 x 10 1 blue  Doorway stretch x 3 30 sec  Wrist flexion/extension/RD 2 x 10 2# bilaterally  5th digit adduction with cotton ball 2 x 10 bilaterally        Manual Therapy - Justified to address any of the following:    Mobilization of joints and soft tissues, manipulation, manual lymphatic drainage, and/or manual traction.    Scar massage- bilaterally  Manual extension with active finger extension bilaterally  STM to palmer fascia - bilaterally    Therapeutic Activity - Justified to address the following:  Dynamic activities to improve functional performance.  Demo and practiced opening jars differently - 2 different sizes  Discussed doing hair and taking  breaks and doing something else and coming back to the hair  Simulated putting bra on in back  Simulated typing with no issues on 5th return key           ---      Flowsheet Row ---   Total Time    Timed Minutes 43 minutes   Total Time 43 minutes          Assessment   Great increase in grip noted bilaterally. Slight increase in flexibility at the wrists. Upgraded HEP to add some strengthening. Will see pt next week and see if requires any additional therapy. Possible discharge if no issues    Give FOTO  Plan   Continue with POC/ see assessment   MD appt TBD      Goals      Goal 1: Increase grip strength to 50 rt  and 30 left pounds to allow patient to open a jar.   Sessions: 12      Goal 2: Increase 3 pt  pinch strength to 12 pounds to assist with ADLs.   Sessions: 10  Goal 3: Patient will demonstrate independence in prescribed HEP with proper form, sets and reps for safe discharge to an independent program.   Sessions: 12      Goal 4: Increase AROM wrist flexion 65 degrees  left to assist reaching into back pocket   Sessions: 12          Goal 5: Pt will have less to no tremors when completing ADl tasks   Sessions: 12                           Ramon Dredge, OTR/CHT

## 2022-09-23 ENCOUNTER — Inpatient Hospital Stay: Payer: BC Managed Care – PPO | Admitting: Rehabilitative and Restorative Service Providers"

## 2022-09-25 ENCOUNTER — Inpatient Hospital Stay: Payer: BC Managed Care – PPO | Admitting: Rehabilitative and Restorative Service Providers"

## 2022-09-30 ENCOUNTER — Inpatient Hospital Stay: Payer: BC Managed Care – PPO | Admitting: Rehabilitative and Restorative Service Providers"

## 2022-10-14 ENCOUNTER — Other Ambulatory Visit (INDEPENDENT_AMBULATORY_CARE_PROVIDER_SITE_OTHER): Payer: Self-pay | Admitting: Family Medicine

## 2022-10-14 ENCOUNTER — Ambulatory Visit
Admission: RE | Admit: 2022-10-14 | Discharge: 2022-10-14 | Disposition: A | Discharge status: Home / Self Care | Payer: BC Managed Care – PPO | Source: Ambulatory Visit | Attending: Family Medicine | Admitting: Family Medicine

## 2022-10-14 DIAGNOSIS — M79641 Pain in right hand: Secondary | ICD-10-CM

## 2022-10-14 DIAGNOSIS — M79642 Pain in left hand: Secondary | ICD-10-CM

## 2022-10-14 DIAGNOSIS — S92902A Unspecified fracture of left foot, initial encounter for closed fracture: Secondary | ICD-10-CM

## 2022-10-22 ENCOUNTER — Encounter (INDEPENDENT_AMBULATORY_CARE_PROVIDER_SITE_OTHER): Payer: Self-pay | Admitting: Orthopaedic Surgery

## 2022-10-22 ENCOUNTER — Ambulatory Visit (INDEPENDENT_AMBULATORY_CARE_PROVIDER_SITE_OTHER): Payer: BC Managed Care – PPO | Admitting: Orthopaedic Surgery

## 2022-10-22 VITALS — BP 139/97 | HR 86

## 2022-10-22 DIAGNOSIS — M19072 Primary osteoarthritis, left ankle and foot: Secondary | ICD-10-CM

## 2022-10-22 DIAGNOSIS — S92322P Displaced fracture of second metatarsal bone, left foot, subsequent encounter for fracture with malunion: Secondary | ICD-10-CM

## 2022-10-22 MED ORDER — ACETAMINOPHEN-CODEINE 300-30 MG PO TABS
1.0000 | ORAL_TABLET | Freq: Four times a day (QID) | ORAL | 0 refills | Status: DC | PRN
Start: 2022-10-22 — End: 2022-10-24

## 2022-10-22 NOTE — Progress Notes (Signed)
[facility]        Provider: Aldean Baker, MD  Patient: [name]  DOB: [birth year]  AGE: 41 y.o.  MR#:  [id number]    SUBJECTIVE:  [name] is a pleasant 41 y.o. female who presents for evaluation of left foot pain. Pain is located along the lateral foot, 2nd MT base, and 2nd MT head. This is an acute exacerbation of chronic pain she has in the foot. A few weeks ago, she twisted her foot when stepping off a curb in a parking lot. Has had pain since.  She had a known injury a few years ago (2021) to the foot that was treated conservatively.        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 kr    CESAREAN SECTION      x 2    CHOLECYSTECTOMY      EYE SURGERY Bilateral     cataract    HYSTERECTOMY  12/2019    RELEASE, CARPAL TUNNEL Left 07/10/2022    Procedure: RELEASE, CARPAL TUNNEL;  Surgeon: Cathrine Muster, MD;  Location: [facility];  Service: Plastics;  Laterality: Left;    REPAIR, UPPER EXTREMITY, TENDON & NERVE Right 12/05/2021    Procedure: REPAIR, RIGHT ULNAR NERVE AND NEUROPLASTY @ ELBOW AND WRIST, RIGHT OPEN CARPAL TUNNEL RELEASE;   Surgeon: Cathrine Muster, MD;  Location: [facility];  Service: Plastics;  Laterality: Right;    REPAIR, UPPER EXTREMITY, TENDON & NERVE Left 07/10/2022    Procedure: LEFT ULNAR NERVE NEUROPLASTY AT ELBOW AND WRIST, LEFT OPEN CARPAL TUNNEL RELEASE;  Surgeon: Cathrine Muster, MD;  Location: [facility];  Service: Plastics;  Laterality: Left;    UTERINE RUPTURE       Allergies   Allergen Reactions    Amlodipine Hives    Bactrim [Sulfamethoxazole-Trimethoprim] Hives, Respiratory Distress and Edema    Glipizide Edema     Throat swelling    Lisinopril Edema     Tongue swelling    Losartan Other (See Comments), Swelling and Edema         REVIEW OF SYSTEMS:  All other systems are negative except as mentioned in the HPI    PHYSICAL EXAM:  Vitals:    10/22/22  1507   BP: (!) 139/97   Pulse: 86        General: The patient is a well appearing female who appears the stated age and is in no acute distress    Left foot and ankle:    Gait: antalgic    Skin: intact, no open wounds    Swelling: none    Ecchymosis: absent    Hindfoot Alignment: slight valgus    Foot Alignment: pes planus    Range of Motion:        Ankle joint dorsiflexion w/knee in extension: 10 deg        Ankle joint dorsiflexion w/knee in flexion: 10 deg        Ankle joint plantarflexion with knee: 45 deg   ROM with pain: no          Subtalar joint eversion: 5 deg        Subtalar joint inversion: 10 deg              ROM with pain: no         Stiff ROM of the 2nd MTP    Tenderness: 5th MT base, globally in the midfoot, 2nd MTP            Stability Exams:          Transverse tarsal joint stress: Positive          Special Tests:        Can perform single leg heel rise: no                  Pain with SLHR: yes                  Location of pain: globally in the midfoot      Motor: 5/5 strength with dorsiflexion, plantarflexion, eversion, inversion, EHL, FHL, toe flexion and extension    Sensation: intact to light tough throughout superficial peroneal, deep  peroneal, tibial, sural, saphenous nerve distributions    Vascular: 2+ DP pulse and well perfused toes      IMAGING STUDIES:  Xrays of the left foot (AP, lateral and oblique views; NWB) obtained 10/14/2022 and personally reviewed by me show deformity of the 2nd MTP joint, degenerative changes of the 2nd and 3rd TMT, healed prior Jones fx       IMPRESSION:      Diagnosis ICD-10-CM Associated Order   1. Arthritis of left midfoot  M19.072 CT foot left without contrast     Walking boot      2. Displaced fracture of second metatarsal bone, left foot, subsequent encounter for fracture with malunion  S92.322P Walking boot          Plan:    Discussed all treatment options including conservative and surgical. She has deformity of there 2nd MTP and degenerative changes of the midfoot. This might be able to be treated with appropriate shoewear accommodations and orthotics. For now, given her acute pain, I recommend boot immobilization for 2-3 weeks, a CT scan of the foot to better characterize her midfoot arthritis, and f/u with my podiatry partners for further care in the even that surgical intervention is needed. Will prescribe a short course of Tylenol with codeine as the patient cannot have NSAIDs due to CKD.     IAldean Baker, MD, personally performed the services documented. Saunders Revel, ATC acted to assist in collecting information and scribe for the visit This note and the patient instructions accurately reflect work  and decisions made by me, Aldean Bakerourtney Shermaine Rivet, MD.

## 2022-10-23 ENCOUNTER — Encounter (INDEPENDENT_AMBULATORY_CARE_PROVIDER_SITE_OTHER): Payer: Self-pay

## 2022-10-23 NOTE — Progress Notes (Signed)
I talked with Ms. Staff yesterday - the pharmacy sent [place] a fax that Tylenol 3 is on backorder.    Dr. Vincenza Hews will send to [facility] in [place]; however, this will be later in the day.  My chart message sent to patient, I also left her a vm.

## 2022-10-24 ENCOUNTER — Other Ambulatory Visit (INDEPENDENT_AMBULATORY_CARE_PROVIDER_SITE_OTHER): Payer: Self-pay | Admitting: Orthopaedic Surgery

## 2022-10-24 ENCOUNTER — Encounter (INDEPENDENT_AMBULATORY_CARE_PROVIDER_SITE_OTHER): Payer: Self-pay

## 2022-10-24 MED ORDER — ACETAMINOPHEN-CODEINE 300-30 MG PO TABS
1.0000 | ORAL_TABLET | Freq: Four times a day (QID) | ORAL | 0 refills | Status: AC | PRN
Start: 2022-10-24 — End: 2022-11-05

## 2022-10-24 NOTE — Progress Notes (Addendum)
I called [name] this AM to let her know that Dr. Vincenza Hews has sent her RF on the Tylenol 3 to the [facility] in [place].  Also, she may see Dr. Ranell Patrick or Dennie Fetters for the foot follow up.  She may schedule this at any time.  Update:  PA needed for Tylenol 3, submitted to electronic system:  PA Case ID: [id number]

## 2022-10-27 ENCOUNTER — Telehealth (INDEPENDENT_AMBULATORY_CARE_PROVIDER_SITE_OTHER): Payer: Self-pay

## 2022-10-27 NOTE — Telephone Encounter (Signed)
I spoke with [facility] pharmacist: she is declining to fill the Tylenol 3 because the patient has changed MDs and pharmacists.  I tried to explain that I have reviewed the PMP and that we have had no issues with this patient and would like it filled, still pharmacist has "deactivated" the Rx.  I have called back to [facility] and they are not taking calls, on hold for long periods of time, they do not pick up.

## 2022-10-28 ENCOUNTER — Encounter (INDEPENDENT_AMBULATORY_CARE_PROVIDER_SITE_OTHER): Payer: Self-pay | Admitting: Orthopaedic Surgery

## 2022-11-20 ENCOUNTER — Emergency Department
Admission: EM | Admit: 2022-11-20 | Discharge: 2022-11-20 | Disposition: A | Discharge status: Home / Self Care | Payer: No Typology Code available for payment source | Attending: Emergency Medicine | Admitting: Emergency Medicine

## 2022-11-20 ENCOUNTER — Emergency Department: Payer: No Typology Code available for payment source

## 2022-11-20 DIAGNOSIS — N189 Chronic kidney disease, unspecified: Secondary | ICD-10-CM | POA: Insufficient documentation

## 2022-11-20 DIAGNOSIS — I130 Hypertensive heart and chronic kidney disease with heart failure and stage 1 through stage 4 chronic kidney disease, or unspecified chronic kidney disease: Secondary | ICD-10-CM | POA: Insufficient documentation

## 2022-11-20 DIAGNOSIS — Z7901 Long term (current) use of anticoagulants: Secondary | ICD-10-CM | POA: Insufficient documentation

## 2022-11-20 DIAGNOSIS — M79605 Pain in left leg: Secondary | ICD-10-CM

## 2022-11-20 DIAGNOSIS — I509 Heart failure, unspecified: Secondary | ICD-10-CM | POA: Insufficient documentation

## 2022-11-20 DIAGNOSIS — Z86718 Personal history of other venous thrombosis and embolism: Secondary | ICD-10-CM

## 2022-11-20 DIAGNOSIS — M79652 Pain in left thigh: Secondary | ICD-10-CM | POA: Insufficient documentation

## 2022-11-20 MED ORDER — ONDANSETRON HCL 4 MG/2ML IJ SOLN
4.0000 mg | Freq: Once | INTRAMUSCULAR | Status: AC
Start: 2022-11-20 — End: 2022-11-20
  Administered 2022-11-20: 4 mg via INTRAVENOUS
  Filled 2022-11-20: qty 2

## 2022-11-20 MED ORDER — MORPHINE SULFATE 2 MG/ML IJ/IV SOLN (WRAP)
4.0000 mg | Freq: Once | Status: AC
Start: 2022-11-20 — End: 2022-11-20
  Administered 2022-11-20: 4 mg via INTRAVENOUS
  Filled 2022-11-20: qty 2
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
## 2022-11-20 NOTE — Discharge Instructions (Signed)
Pleas go to your PCP to repeat the [place] in the next 48 hours if you leg pain persists. If you develop any new chest pain, shortness of breath, swelling in your leg, or any other concerning symptoms please return to the ER. You may take tylenol 1000mg  every 8 hours as needed for pain and use the lidocaine patches that you have at home. Please avoid ibuprofen due to your kidney problems.

## 2022-11-20 NOTE — ED Triage Notes (Signed)
Pt with hx of dvt's. Diagnosed with dvt in right leg 4 months ago.  Pt states she takes eliquis but missed two doses last week.  Pt states she is now having pain in her right leg.

## 2022-11-20 NOTE — ED Provider Notes (Signed)
EMERGENCY DEPARTMENT NOTE     Patient initially seen and examined at   ED PHYSICIAN ASSIGNED       None           ED MIDLEVEL (APP) ASSIGNED       Date/Time Event User Comments    11/20/22 1055 PA/NP Provider Assigned Earnestine Leys, FNP assigned as Nurse Practitioner            HISTORY OF PRESENT ILLNESS   Translator Used : No    Chief Complaint: Leg Pain (Left leg)       42 y.o. 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 L posterior thigh pain. Denies erythema or LLE edema. Denies chest pain, endorsing SOB and cough, hx of asthma. 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 kr    CESAREAN SECTION      x 2    CHOLECYSTECTOMY      EYE SURGERY Bilateral     cataract    HYSTERECTOMY  12/2019    RELEASE, CARPAL TUNNEL Left 07/10/2022    Procedure: RELEASE, CARPAL TUNNEL;  Surgeon: Cathrine Muster, MD;  Location: [facility];  Service: Plastics;  Laterality: Left;    REPAIR, UPPER EXTREMITY, TENDON & NERVE  Right 12/05/2021    Procedure: REPAIR, RIGHT ULNAR NERVE AND NEUROPLASTY @ ELBOW AND WRIST, RIGHT OPEN CARPAL TUNNEL RELEASE;  Surgeon: Cathrine Muster, MD;  Location: [facility];  Service: Plastics;  Laterality: Right;    REPAIR, UPPER EXTREMITY, TENDON & NERVE Left 07/10/2022    Procedure: LEFT ULNAR NERVE NEUROPLASTY AT ELBOW AND WRIST, LEFT OPEN CARPAL TUNNEL RELEASE;  Surgeon: Cathrine Muster, MD;  Location: [facility];  Service: Plastics;  Laterality: Left;    UTERINE RUPTURE         Social History:  Social History     Socioeconomic History    Marital status: Single   Tobacco Use    Smoking status: Never    Smokeless tobacco: Never   Vaping Use  Vaping Use: Never used   Substance and Sexual Activity    Alcohol use: Never    Drug use: Never   Social History Narrative    ** Merged History Encounter **          Social Determinants of Health     Financial Resource Strain: Low Risk  (08/26/2022)    Overall Financial Resource Strain (CARDIA)     Difficulty of Paying Living Expenses: Not hard at all   Food Insecurity: Food Insecurity Present (08/26/2022)    Hunger Vital Sign     Worried About Running Out of Food in the Last Year: Sometimes true     Ran Out of Food in the Last Year: Never true   Transportation Needs: No Transportation Needs (08/26/2022)    PRAPARE - Therapist, art (Medical): No     Lack of Transportation (Non-Medical): No   Physical Activity: Insufficiently Active (08/26/2022)    Exercise Vital Sign     Days of Exercise per Week: 2 days     Minutes of Exercise per Session: 30 min   Stress: Stress Concern Present (08/26/2022)    [employer] of Occupational Health - Occupational Stress Questionnaire     Feeling of Stress : Very much   Social Connections: Unknown (08/26/2022)    Social Connection and Isolation Panel [NHANES]     Frequency of Communication with Friends and Family: Once a week     Frequency of Social Gatherings with Friends and Family: Patient  declined     Attends Religious Services: More than 4 times per year     Active Member of [employer] or Organizations: No     Attends Banker Meetings: Patient declined     Marital Status: Divorced   Catering manager Violence: Not At Risk (08/26/2022)    Humiliation, Afraid, Rape, and Kick questionnaire     Fear of Current or Ex-Partner: No     Emotionally Abused: No     Physically Abused: No     Sexually Abused: No   Housing Stability: Low Risk  (08/26/2022)    Housing Stability Vital Sign     Unable to Pay for Housing in the Last Year: No     Number of Places Lived in the Last Year: 1     Unstable Housing in the Last Year: No       Family History:  Family History   Problem Relation Age of Onset    Heart disease Mother     Hypertension Mother     Diabetes Mother     Asthma Father     Hypertension Father     Diabetes Daughter     Asthma Son     Cancer Maternal Aunt     Cancer Maternal Grandmother        Outpatient Medication:  Discharge Medication List as of 11/20/2022  1:02 PM        CONTINUE these medications which have NOT CHANGED    Details   albuterol sulfate HFA (PROVENTIL) 108 (90 Base) MCG/ACT inhaler Inhale 2 puffs into the lungs every 4 (four) hours as needed for Wheezing, Starting Mon 03/18/2021, E-Rx      Alcohol Swabs Pads Use as indicated for insulin injections, E-Rx      apixaban (ELIQUIS) 5 MG Take 1 tablet (5 mg) by mouth every 12 (twelve) hours, Historical Med      ARIPiprazole (ABILIFY) 5 MG tablet Take 2 tablets (10 mg)  by mouth daily, Starting Tue 09/24/2021, Historical Med      aspirin 81 MG chewable tablet Chew 1 tablet (81 mg) by mouth daily, Starting Tue 12/25/2020, Historical Med      atorvastatin (LIPITOR) 40 MG tablet Take 2 tablets (80 mg) by mouth nightly, Starting Thu 09/05/2021, Historical Med      Azelastine-Fluticasone 137-50 MCG/ACT Suspension 1 spray by Nasal route 2 (two) times daily, Starting Fri 05/17/2021, Historical Med      baclofen (LIORESAL) 10 MG tablet Take 1 tablet (10  mg) by mouth as needed, Historical Med      bumetanide (BUMEX) 2 MG tablet Take 1 tablet (2 mg total) by mouth 2 (two) times daily, Starting Tue 08/21/2020, E-Rx      buPROPion XL (WELLBUTRIN XL) 150 MG 24 hr tablet Take 1 tablet (150 mg) by mouth daily, Starting Fri 03/29/2021, Historical Med      carvedilol (COREG) 25 MG tablet Take 1 tablet (25 mg) by mouth 2 (two) times daily, Historical Med      diclofenac (VOLTAREN) 50 MG EC tablet Take 1 tablet (50 mg) by mouth daily, Starting Thu 09/05/2021, Historical Med      diclofenac Sodium (VOLTAREN) 1 % Gel topical gel Apply 2 g topically 4 (four) times daily, Starting Thu 02/27/2022, E-Rx      docusate sodium (COLACE) 100 MG capsule Take 1 capsule (100 mg) by mouth nightly, Starting Thu 09/05/2021, Historical Med      Dulera 200-5 MCG/ACT Aerosol Inhale 2 puffs into the lungs daily, Starting Thu 09/05/2021, Historical Med      DULoxetine (CYMBALTA) 60 MG capsule Take 1 capsule (60 mg) by mouth daily, Starting Tue 09/24/2021, Historical Med      EPINEPHrine 0.15 MG/0.3ML injection Inject 0.3 mLs (0.15 mg) into the muscle as needed, Starting Thu 09/05/2021, Historical Med      folic acid (FOLVITE) 1 MG tablet Take 1 tablet (1 mg) by mouth daily, Historical Med      hydrALAZINE (APRESOLINE) 50 MG tablet Take 1 tablet (50 mg) by mouth 2 (two) times daily, Historical Med      hydrOXYzine (ATARAX) 10 MG tablet Take 1 tablet (10 mg) by mouth every 6 (six) hours as needed for Anxiety, Historical Med      insulin detemir (Levemir) 100 UNIT/ML injection Inject 50 Units into the skin every 12 (twelve) hours, Starting Mon 08/13/2020, E-Rx      insulin lispro (HumaLOG) 100 UNIT/ML injection Inject 25 Units into the skin 3 (three) times daily before meals, Starting Mon 08/13/2020, E-Rx      Insulin Syringe-Needle U-100 27G X 1/2" 1 ML Misc Inject 1 Dose into the skin 4 (four) times daily, Starting Mon 08/13/2020, E-Rx      lidocaine (LIDODERM) 5 % Place 1 patch onto the skin every 24  hours Remove & Discard patch within 12 hours or as directed by MD, Starting Mon 04/29/2021, E-Rx      meclizine (ANTIVERT) 25 MG tablet Take 1 tablet (25 mg) by mouth as needed, Historical Med      metOLazone (ZAROXOLYN) 10 MG tablet Take 1 tablet (10 mg) by mouth daily, Starting Tue 12/25/2020, Historical Med      montelukast (SINGULAIR) 10 MG tablet Take 1 tablet (10 mg) by mouth nightly, Historical Med      olmesartan (BENICAR) 40 MG tablet Take 1 tablet (40 mg) by mouth daily, Historical Med      omeprazole (PriLOSEC) 40 MG capsule Take 1 capsule (40  mg) by mouth nightly, Historical Med      Ozempic, 0.25 or 0.5 MG/DOSE, 2 MG/3ML Solution Pen-injector Inject into the skin Once each week on Sunday morning, Historical Med      semaglutide [facility]) 1 MG/0.5ML injection Inject 0.5 mLs (1 mg) into the skin once a week, Historical Med      topiramate (TOPAMAX) 100 MG tablet Take 1 tablet (100 mg) by mouth daily, Historical Med      traZODone (DESYREL) 150 MG tablet Take 1 tablet (150 mg) by mouth nightly, Historical Med      valACYclovir (VALTREX) 1000 MG tablet Take 1 tablet (1,000 mg) by mouth as needed, Historical Med               REVIEW OF SYSTEMS   Review of Systems See History of Present Illness  PHYSICAL EXAM     ED Triage Vitals [11/20/22 1051]   Enc Vitals Group      BP (!) 163/97      Heart Rate 87      Resp Rate 19      Temp 98.6 F (37 C)      Temp Source Oral      SpO2 100 %      Weight 135.1 kg      Height 1.676 m      Head Circumference       Peak Flow       Pain Score 6      Pain Loc       Pain Edu?       Excl. in [place]?      Physical Exam  Vitals and nursing note reviewed.   Constitutional:       General: She is not in acute distress.     Appearance: Normal appearance.   Cardiovascular:      Rate and Rhythm: Normal rate.      Pulses:           Popliteal pulses are 2+ on the right side.        Dorsalis pedis pulses are 2+ on the right side.        Posterior tibial pulses are 2+ on the right side.       Heart sounds: Normal heart sounds.   Pulmonary:      Effort: Pulmonary effort is normal.      Breath sounds: No wheezing, rhonchi or rales.   Musculoskeletal:        Legs:    Skin:     General: Skin is warm and dry.      Capillary Refill: Capillary refill takes less than 2 seconds.   Neurological:      General: No focal deficit present.      Mental Status: She is alert and oriented to person, place, and time.          MEDICAL DECISION MAKING     PRIMARY PROBLEM LIST      Acute illness/injury DIAGNOSIS:Leg pain, history of blood clots  Chronic Illness Impacting Care of the above problem: Renal Disease Increases complexity of evaluation, Increases the risk of severe disease, and Increase the risk of disease progression  Differential Diagnosis: DVT, musculoskeletal strain, fracture, PE    DISCUSSION        Patient presenting for atraumatic L posterior thigh pain. No bony tenderness or traumatic mechanism fracture unlikely. Pt missed 2 doses of her eliquis but resumed it last night. Will order LLE [place] to r/o DVT. Endorses SOB  with cough but patient states it is consistent with her asthma, explicitly denies chest pain. Unlikely to be PE if LLE [place] is negative for DVT, is [place] comes back positive will order blood work and CT angio chest, if negative will [place] home with follow up with PCP to repeat US in 48 hours if pain persists. Morphine/zofran for pain control, family at bedside will drive home if [place].    On re-assessment [place] negative for DVT, discussed test results with patient. States she will continue taking her eliquis and follow up with PCP to repeat US. Patient participated in and is agreeable to disposition. Discussed strict return precautions for chest pain, SOB, leg swelling or discoloration.           Vital Signs: Reviewed the patient's vital signs.   Nursing Notes: Reviewed and utilized available nursing notes.  Medical Records Reviewed: Reviewed available past medical records.  Counseling: The emergency provider  has spoken with the patient and discussed today's findings, in addition to providing specific details for the plan of care.  Questions are answered and there is agreement with the plan.      RADIOLOGY IMAGING STUDIES      [place] VenoDopp Low Extremity Left   Final Result    No evidence of left lower extremity DVT.       Darra Lis, MD   11/20/2022 12:36 PM          EMERGENCY DEPT. MEDICATIONS      ED Medication Orders (From admission, onward)      Start Ordered     Status Ordering Provider    11/20/22 1104 11/20/22 1103  morphine injection 4 mg  Once        Route: Intravenous  Ordered Dose: 4 mg       Last MAR action: Given CARROLL,  B    11/20/22 1104 11/20/22 1103  ondansetron (ZOFRAN) injection 4 mg  Once        Route: Intravenous  Ordered Dose: 4 mg       Last MAR action: Given CARROLL,  B            LABORATORY RESULTS    Ordered and independently interpreted AVAILABLE laboratory tests.   Results       ** No results found for the last 24 hours. **              CRITICAL CARE/PROCEDURES    Procedures    DIAGNOSIS      Diagnosis:  Final diagnoses:   Pain of left lower extremity   History of blood clots       Disposition:  ED Disposition       ED Disposition   Discharge    Condition   --    Date/Time   Thu Nov 20, 2022  1:02 PM    Comment   [name] discharge to home/self care.    Condition at disposition: Stable                 Prescriptions:  Discharge Medication List as of 11/20/2022  1:02 PM        CONTINUE these medications which have NOT CHANGED    Details   albuterol sulfate HFA (PROVENTIL) 108 (90 Base) MCG/ACT inhaler Inhale 2 puffs into the lungs every 4 (four) hours as needed for Wheezing, Starting Mon 03/18/2021, E-Rx      Alcohol Swabs Pads Use as indicated for insulin injections, E-Rx  apixaban (ELIQUIS) 5 MG Take 1 tablet (5 mg) by mouth every 12 (twelve) hours, Historical Med      ARIPiprazole (ABILIFY) 5 MG tablet Take 2 tablets (10 mg) by mouth daily, Starting Tue  09/24/2021, Historical Med      aspirin 81 MG chewable tablet Chew 1 tablet (81 mg) by mouth daily, Starting Tue 12/25/2020, Historical Med      atorvastatin (LIPITOR) 40 MG tablet Take 2 tablets (80 mg) by mouth nightly, Starting Thu 09/05/2021, Historical Med      Azelastine-Fluticasone 137-50 MCG/ACT Suspension 1 spray by Nasal route 2 (two) times daily, Starting Fri 05/17/2021, Historical Med      baclofen (LIORESAL) 10 MG tablet Take 1 tablet (10 mg) by mouth as needed, Historical Med      bumetanide (BUMEX) 2 MG tablet Take 1 tablet (2 mg total) by mouth 2 (two) times daily, Starting Tue 08/21/2020, E-Rx      buPROPion XL (WELLBUTRIN XL) 150 MG 24 hr tablet Take 1 tablet (150 mg) by mouth daily, Starting Fri 03/29/2021, Historical Med      carvedilol (COREG) 25 MG tablet Take 1 tablet (25 mg) by mouth 2 (two) times daily, Historical Med      diclofenac (VOLTAREN) 50 MG EC tablet Take 1 tablet (50 mg) by mouth daily, Starting Thu 09/05/2021, Historical Med      diclofenac Sodium (VOLTAREN) 1 % Gel topical gel Apply 2 g topically 4 (four) times daily, Starting Thu 02/27/2022, E-Rx      docusate sodium (COLACE) 100 MG capsule Take 1 capsule (100 mg) by mouth nightly, Starting Thu 09/05/2021, Historical Med      Dulera 200-5 MCG/ACT Aerosol Inhale 2 puffs into the lungs daily, Starting Thu 09/05/2021, Historical Med      DULoxetine (CYMBALTA) 60 MG capsule Take 1 capsule (60 mg) by mouth daily, Starting Tue 09/24/2021, Historical Med      EPINEPHrine 0.15 MG/0.3ML injection Inject 0.3 mLs (0.15 mg) into the muscle as needed, Starting Thu 09/05/2021, Historical Med      folic acid (FOLVITE) 1 MG tablet Take 1 tablet (1 mg) by mouth daily, Historical Med      hydrALAZINE (APRESOLINE) 50 MG tablet Take 1 tablet (50 mg) by mouth 2 (two) times daily, Historical Med      hydrOXYzine (ATARAX) 10 MG tablet Take 1 tablet (10 mg) by mouth every 6 (six) hours as needed for Anxiety, Historical Med      insulin detemir (Levemir)  100 UNIT/ML injection Inject 50 Units into the skin every 12 (twelve) hours, Starting Mon 08/13/2020, E-Rx      insulin lispro (HumaLOG) 100 UNIT/ML injection Inject 25 Units into the skin 3 (three) times daily before meals, Starting Mon 08/13/2020, E-Rx      Insulin Syringe-Needle U-100 27G X 1/2" 1 ML Misc Inject 1 Dose into the skin 4 (four) times daily, Starting Mon 08/13/2020, E-Rx      lidocaine (LIDODERM) 5 % Place 1 patch onto the skin every 24 hours Remove & Discard patch within 12 hours or as directed by MD, Starting Mon 04/29/2021, E-Rx      meclizine (ANTIVERT) 25 MG tablet Take 1 tablet (25 mg) by mouth as needed, Historical Med      metOLazone (ZAROXOLYN) 10 MG tablet Take 1 tablet (10 mg) by mouth daily, Starting Tue 12/25/2020, Historical Med      montelukast (SINGULAIR) 10 MG tablet Take 1 tablet (10 mg) by mouth nightly, Historical Med  olmesartan (BENICAR) 40 MG tablet Take 1 tablet (40 mg) by mouth daily, Historical Med      omeprazole (PriLOSEC) 40 MG capsule Take 1 capsule (40 mg) by mouth nightly, Historical Med      Ozempic, 0.25 or 0.5 MG/DOSE, 2 MG/3ML Solution Pen-injector Inject into the skin Once each week on Sunday morning, Historical Med      semaglutide [facility]) 1 MG/0.5ML injection Inject 0.5 mLs (1 mg) into the skin once a week, Historical Med      topiramate (TOPAMAX) 100 MG tablet Take 1 tablet (100 mg) by mouth daily, Historical Med      traZODone (DESYREL) 150 MG tablet Take 1 tablet (150 mg) by mouth nightly, Historical Med      valACYclovir (VALTREX) 1000 MG tablet Take 1 tablet (1,000 mg) by mouth as needed, Historical Med                 This note was generated by the Epic EMR system/ Dragon speech recognition and may contain inherent errors or omissions not intended by the user. Grammatical errors, random word insertions, deletions and pronoun errors  are occasional consequences of this technology due to software limitations. Not all errors are caught or corrected. If there  are questions or concerns about the content of this note or information contained within the body of this dictation they should be addressed directly with the author for clarification.     Janeice Robinson, FNP  11/20/22 1336

## 2022-11-27 ENCOUNTER — Inpatient Hospital Stay
Admission: EM | Admit: 2022-11-27 | Discharge: 2022-12-02 | DRG: 469 | Disposition: A | Discharge status: Home / Self Care | Payer: No Typology Code available for payment source | Attending: Internal Medicine | Admitting: Internal Medicine

## 2022-11-27 ENCOUNTER — Emergency Department: Payer: No Typology Code available for payment source

## 2022-11-27 DIAGNOSIS — E875 Hyperkalemia: Secondary | ICD-10-CM | POA: Diagnosis present

## 2022-11-27 DIAGNOSIS — I160 Hypertensive urgency: Secondary | ICD-10-CM | POA: Diagnosis present

## 2022-11-27 DIAGNOSIS — I130 Hypertensive heart and chronic kidney disease with heart failure and stage 1 through stage 4 chronic kidney disease, or unspecified chronic kidney disease: Secondary | ICD-10-CM | POA: Diagnosis present

## 2022-11-27 DIAGNOSIS — N184 Chronic kidney disease, stage 4 (severe): Secondary | ICD-10-CM | POA: Diagnosis present

## 2022-11-27 DIAGNOSIS — Z7985 Long-term (current) use of injectable non-insulin antidiabetic drugs: Secondary | ICD-10-CM

## 2022-11-27 DIAGNOSIS — G4733 Obstructive sleep apnea (adult) (pediatric): Secondary | ICD-10-CM | POA: Diagnosis present

## 2022-11-27 DIAGNOSIS — M797 Fibromyalgia: Secondary | ICD-10-CM | POA: Diagnosis present

## 2022-11-27 DIAGNOSIS — Z7982 Long term (current) use of aspirin: Secondary | ICD-10-CM

## 2022-11-27 DIAGNOSIS — J45901 Unspecified asthma with (acute) exacerbation: Secondary | ICD-10-CM | POA: Diagnosis present

## 2022-11-27 DIAGNOSIS — R0602 Shortness of breath: Secondary | ICD-10-CM

## 2022-11-27 DIAGNOSIS — N179 Acute kidney failure, unspecified: Principal | ICD-10-CM | POA: Diagnosis present

## 2022-11-27 DIAGNOSIS — Z79899 Other long term (current) drug therapy: Secondary | ICD-10-CM

## 2022-11-27 DIAGNOSIS — Z86711 Personal history of pulmonary embolism: Secondary | ICD-10-CM

## 2022-11-27 DIAGNOSIS — E872 Acidosis, unspecified: Secondary | ICD-10-CM | POA: Diagnosis present

## 2022-11-27 DIAGNOSIS — Z7901 Long term (current) use of anticoagulants: Secondary | ICD-10-CM

## 2022-11-27 DIAGNOSIS — I5033 Acute on chronic diastolic (congestive) heart failure: Secondary | ICD-10-CM | POA: Diagnosis not present

## 2022-11-27 DIAGNOSIS — J4541 Moderate persistent asthma with (acute) exacerbation: Principal | ICD-10-CM

## 2022-11-27 DIAGNOSIS — E1140 Type 2 diabetes mellitus with diabetic neuropathy, unspecified: Secondary | ICD-10-CM | POA: Diagnosis present

## 2022-11-27 DIAGNOSIS — E782 Mixed hyperlipidemia: Secondary | ICD-10-CM | POA: Diagnosis present

## 2022-11-27 DIAGNOSIS — E1122 Type 2 diabetes mellitus with diabetic chronic kidney disease: Secondary | ICD-10-CM | POA: Diagnosis present

## 2022-11-27 DIAGNOSIS — Z794 Long term (current) use of insulin: Secondary | ICD-10-CM

## 2022-11-27 DIAGNOSIS — Z6841 Body Mass Index (BMI) 40.0 and over, adult: Secondary | ICD-10-CM

## 2022-11-27 DIAGNOSIS — E1165 Type 2 diabetes mellitus with hyperglycemia: Secondary | ICD-10-CM | POA: Diagnosis present

## 2022-11-27 DIAGNOSIS — D631 Anemia in chronic kidney disease: Secondary | ICD-10-CM | POA: Diagnosis present

## 2022-11-27 DIAGNOSIS — N2581 Secondary hyperparathyroidism of renal origin: Secondary | ICD-10-CM | POA: Diagnosis present

## 2022-11-27 LAB — CBC AND DIFFERENTIAL
Absolute NRBC: 0 10*3/uL (ref 0.00–0.00)
Basophils Absolute Automated: 0.06 10*3/uL (ref 0.00–0.08)
Basophils Automated: 0.4 %
Eosinophils Absolute Automated: 0.34 10*3/uL (ref 0.00–0.44)
Eosinophils Automated: 2.2 %
Hematocrit: 31.4 % — ABNORMAL LOW (ref 34.7–43.7)
Hgb: 9.1 g/dL — ABNORMAL LOW (ref 11.4–14.8)
Immature Granulocytes Absolute: 0.12 10*3/uL — ABNORMAL HIGH (ref 0.00–0.07)
Immature Granulocytes: 0.8 %
Instrument Absolute Neutrophil Count: 11.23 10*3/uL — ABNORMAL HIGH (ref 1.10–6.33)
Lymphocytes Absolute Automated: 2.84 10*3/uL (ref 0.42–3.22)
Lymphocytes Automated: 18.3 %
MCH: 25.3 pg (ref 25.1–33.5)
MCHC: 29 g/dL — ABNORMAL LOW (ref 31.5–35.8)
MCV: 87.2 fL (ref 78.0–96.0)
MPV: 10.2 fL (ref 8.9–12.5)
Monocytes Absolute Automated: 0.89 10*3/uL — ABNORMAL HIGH (ref 0.21–0.85)
Monocytes: 5.7 %
Neutrophils Absolute: 11.23 10*3/uL — ABNORMAL HIGH (ref 1.10–6.33)
Neutrophils: 72.6 %
Nucleated RBC: 0 /100 WBC (ref 0.0–0.0)
Platelets: 383 10*3/uL — ABNORMAL HIGH (ref 142–346)
RBC: 3.6 10*6/uL — ABNORMAL LOW (ref 3.90–5.10)
RDW: 15 % (ref 11–15)
WBC: 15.48 10*3/uL — ABNORMAL HIGH (ref 3.10–9.50)

## 2022-11-27 LAB — COMPREHENSIVE METABOLIC PANEL
ALT: 9 U/L (ref 0–55)
AST (SGOT): 11 U/L (ref 5–41)
Albumin/Globulin Ratio: 0.8 — ABNORMAL LOW (ref 0.9–2.2)
Albumin: 2.5 g/dL — ABNORMAL LOW (ref 3.5–5.0)
Alkaline Phosphatase: 97 U/L (ref 37–117)
Anion Gap: 6 (ref 5.0–15.0)
BUN: 32 mg/dL — ABNORMAL HIGH (ref 7.0–21.0)
Bilirubin, Total: 0.2 mg/dL (ref 0.2–1.2)
CO2: 17 mEq/L (ref 17–29)
Calcium: 7.9 mg/dL — ABNORMAL LOW (ref 8.5–10.5)
Chloride: 113 mEq/L — ABNORMAL HIGH (ref 99–111)
Creatinine: 2.1 mg/dL — ABNORMAL HIGH (ref 0.4–1.0)
Globulin: 3.3 g/dL (ref 2.0–3.6)
Glucose: 271 mg/dL — ABNORMAL HIGH (ref 70–100)
Potassium: 5.5 mEq/L — ABNORMAL HIGH (ref 3.5–5.3)
Protein, Total: 5.8 g/dL — ABNORMAL LOW (ref 6.0–8.3)
Sodium: 136 mEq/L (ref 135–145)
eGFR: 29.4 mL/min/{1.73_m2} — AB (ref >=60)

## 2022-11-27 LAB — NT-PROBNP: NT-proBNP: 1368 pg/mL — ABNORMAL HIGH (ref 0–125)

## 2022-11-27 LAB — URINALYSIS WITH REFLEX TO MICROSCOPIC EXAM - REFLEX TO CULTURE
Bilirubin, UA: NEGATIVE
Glucose, UA: 250 — AB
Ketones UA: NEGATIVE
Leukocyte Esterase, UA: NEGATIVE
Nitrite, UA: NEGATIVE
Protein, UR: >=300 — AB
Specific Gravity UA: 1.025 (ref 1.001–1.035)
Urine pH: 6 (ref 5.0–8.0)
Urobilinogen, UA: 0.2 mg/dL (ref 0.2–2.0)

## 2022-11-27 LAB — LIPASE: Lipase: 39 U/L (ref 8–78)

## 2022-11-27 LAB — COVID-19 (SARS-COV-2) & INFLUENZA  A/B, NAA (ROCHE LIAT)
Influenza A: NOT DETECTED
Influenza B: NOT DETECTED
SARS-CoV-2 Overall Result: NOT DETECTED

## 2022-11-27 LAB — HIGH SENSITIVITY TROPONIN-I WITH DELTA
hs Troponin-I Delta: 1.8 ng/L
hs Troponin-I: 10.7 ng/L

## 2022-11-27 LAB — HIGH SENSITIVITY TROPONIN-I: hs Troponin-I: 8.9 ng/L

## 2022-11-27 MED ORDER — HYDRALAZINE HCL 20 MG/ML IJ SOLN
10.0000 mg | Freq: Once | INTRAMUSCULAR | Status: AC
Start: 2022-11-27 — End: 2022-11-27
  Administered 2022-11-27: 10 mg via INTRAVENOUS
  Filled 2022-11-27: qty 1

## 2022-11-27 MED ORDER — ONDANSETRON HCL 4 MG/2ML IJ SOLN
4.0000 mg | Freq: Four times a day (QID) | INTRAMUSCULAR | Status: DC | PRN
Start: 2022-11-27 — End: 2022-11-28

## 2022-11-27 MED ORDER — GLUCAGON 1 MG IJ SOLR (WRAP)
1.0000 mg | INTRAMUSCULAR | Status: DC | PRN
Start: 2022-11-27 — End: 2022-12-02

## 2022-11-27 MED ORDER — PREGABALIN 25 MG PO CAPS
150.0000 mg | ORAL_CAPSULE | Freq: Two times a day (BID) | ORAL | Status: DC
Start: 2022-11-27 — End: 2022-12-02
  Administered 2022-11-28 – 2022-12-02 (×10): 150 mg via ORAL
  Filled 2022-11-27 (×10): qty 2

## 2022-11-27 MED ORDER — CARBOXYMETHYLCELLULOSE SODIUM 0.5 % OP SOLN
1.0000 [drp] | Freq: Three times a day (TID) | OPHTHALMIC | Status: DC | PRN
Start: 2022-11-27 — End: 2022-12-02

## 2022-11-27 MED ORDER — DEXTROSE 50 % IV SOLN
12.5000 g | INTRAVENOUS | Status: DC | PRN
Start: 2022-11-27 — End: 2022-12-02

## 2022-11-27 MED ORDER — ALBUTEROL-IPRATROPIUM 2.5-0.5 (3) MG/3ML IN SOLN
3.0000 mL | Freq: Once | RESPIRATORY_TRACT | Status: AC
Start: 2022-11-27 — End: 2022-11-27
  Administered 2022-11-27: 3 mL via RESPIRATORY_TRACT
  Filled 2022-11-27: qty 3

## 2022-11-27 MED ORDER — MONTELUKAST SODIUM 10 MG PO TABS
10.0000 mg | ORAL_TABLET | Freq: Every evening | ORAL | Status: DC
Start: 2022-11-27 — End: 2022-12-02
  Administered 2022-11-28 – 2022-12-01 (×5): 10 mg via ORAL
  Filled 2022-11-27 (×5): qty 1

## 2022-11-27 MED ORDER — FUROSEMIDE 10 MG/ML IJ SOLN
40.0000 mg | Freq: Once | INTRAMUSCULAR | Status: AC
Start: 2022-11-27 — End: 2022-11-27
  Administered 2022-11-27: 40 mg via INTRAVENOUS
  Filled 2022-11-27: qty 4

## 2022-11-27 MED ORDER — ASPIRIN 81 MG PO CHEW
81.0000 mg | CHEWABLE_TABLET | Freq: Every day | ORAL | Status: DC
Start: 2022-11-28 — End: 2022-12-02
  Administered 2022-11-28 – 2022-12-02 (×5): 81 mg via ORAL
  Filled 2022-11-27 (×5): qty 1

## 2022-11-27 MED ORDER — POTASSIUM & SODIUM PHOSPHATES 280-160-250 MG PO PACK
2.0000 | PACK | ORAL | Status: DC | PRN
Start: 2022-11-27 — End: 2022-11-27

## 2022-11-27 MED ORDER — ACETAMINOPHEN 325 MG PO TABS
650.0000 mg | ORAL_TABLET | Freq: Three times a day (TID) | ORAL | Status: DC | PRN
Start: 2022-11-27 — End: 2022-11-28

## 2022-11-27 MED ORDER — ATORVASTATIN CALCIUM 80 MG PO TABS
80.0000 mg | ORAL_TABLET | Freq: Every evening | ORAL | Status: DC
Start: 2022-11-27 — End: 2022-12-02
  Administered 2022-11-28 – 2022-12-01 (×4): 80 mg via ORAL
  Filled 2022-11-27 (×4): qty 1

## 2022-11-27 MED ORDER — DOCUSATE SODIUM 100 MG PO CAPS
100.0000 mg | ORAL_CAPSULE | Freq: Every evening | ORAL | Status: DC
Start: 2022-11-27 — End: 2022-12-02
  Administered 2022-11-28 – 2022-12-01 (×5): 100 mg via ORAL
  Filled 2022-11-27 (×5): qty 1

## 2022-11-27 MED ORDER — DEXTROSE 10 % IV BOLUS
12.5000 g | INTRAVENOUS | Status: DC | PRN
Start: 2022-11-27 — End: 2022-12-02

## 2022-11-27 MED ORDER — POTASSIUM CHLORIDE CRYS ER 20 MEQ PO TBCR
0.0000 meq | EXTENDED_RELEASE_TABLET | ORAL | Status: DC | PRN
Start: 2022-11-27 — End: 2022-11-27

## 2022-11-27 MED ORDER — DULOXETINE HCL 20 MG PO CPEP
20.0000 mg | ORAL_CAPSULE | Freq: Every day | ORAL | Status: DC
Start: 2022-11-28 — End: 2022-12-02
  Administered 2022-11-28 – 2022-12-02 (×5): 20 mg via ORAL
  Filled 2022-11-27 (×5): qty 1

## 2022-11-27 MED ORDER — OXYCODONE HCL 5 MG PO TABS
5.0000 mg | ORAL_TABLET | Freq: Once | ORAL | Status: AC
Start: 2022-11-27 — End: 2022-11-27
  Administered 2022-11-27: 5 mg via ORAL
  Filled 2022-11-27: qty 1

## 2022-11-27 MED ORDER — ONDANSETRON 4 MG PO TBDP
4.0000 mg | ORAL_TABLET | Freq: Four times a day (QID) | ORAL | Status: DC | PRN
Start: 2022-11-27 — End: 2022-11-28

## 2022-11-27 MED ORDER — BENZONATATE 100 MG PO CAPS
100.0000 mg | ORAL_CAPSULE | Freq: Three times a day (TID) | ORAL | Status: DC | PRN
Start: 2022-11-27 — End: 2022-12-02
  Administered 2022-11-28: 100 mg via ORAL
  Filled 2022-11-27: qty 1

## 2022-11-27 MED ORDER — APIXABAN 5 MG PO TABS
5.0000 mg | ORAL_TABLET | Freq: Two times a day (BID) | ORAL | Status: DC
Start: 2022-11-28 — End: 2022-12-02
  Administered 2022-11-28 – 2022-12-02 (×10): 5 mg via ORAL
  Filled 2022-11-27 (×10): qty 1

## 2022-11-27 MED ORDER — NALOXONE HCL 0.4 MG/ML IJ SOLN (WRAP)
0.2000 mg | INTRAMUSCULAR | Status: DC | PRN
Start: 2022-11-27 — End: 2022-12-02

## 2022-11-27 MED ORDER — ALBUTEROL SULFATE (2.5 MG/3ML) 0.083% IN NEBU
2.5000 mg | INHALATION_SOLUTION | Freq: Once | RESPIRATORY_TRACT | Status: AC
Start: 2022-11-27 — End: 2022-11-27
  Administered 2022-11-27: 2.5 mg via RESPIRATORY_TRACT
  Filled 2022-11-27: qty 3

## 2022-11-27 MED ORDER — ACETAMINOPHEN 500 MG PO TABS
1000.0000 mg | ORAL_TABLET | Freq: Once | ORAL | Status: AC
Start: 2022-11-27 — End: 2022-11-27
  Administered 2022-11-27: 1000 mg via ORAL
  Filled 2022-11-27: qty 2

## 2022-11-27 MED ORDER — GLUCOSE 40 % PO GEL (WRAP)
15.0000 g | ORAL | Status: DC | PRN
Start: 2022-11-27 — End: 2022-12-02

## 2022-11-27 MED ORDER — ALBUTEROL SULFATE (2.5 MG/3ML) 0.083% IN NEBU
2.5000 mg | INHALATION_SOLUTION | RESPIRATORY_TRACT | Status: DC
Start: 2022-11-28 — End: 2022-11-28
  Administered 2022-11-28: 2.5 mg via RESPIRATORY_TRACT
  Filled 2022-11-27: qty 3

## 2022-11-27 MED ORDER — MORPHINE SULFATE 2 MG/ML IJ/IV SOLN (WRAP)
1.0000 mg | Status: DC | PRN
Start: 2022-11-27 — End: 2022-11-28

## 2022-11-27 MED ORDER — POLYETHYLENE GLYCOL 3350 17 G PO PACK
17.0000 g | PACK | Freq: Every day | ORAL | Status: DC | PRN
Start: 2022-11-27 — End: 2022-12-02
  Administered 2022-11-30 – 2022-12-02 (×3): 17 g via ORAL
  Filled 2022-11-27 (×3): qty 1

## 2022-11-27 MED ORDER — KETOROLAC TROMETHAMINE 30 MG/ML IJ SOLN
15.0000 mg | Freq: Once | INTRAMUSCULAR | Status: AC
Start: 2022-11-27 — End: 2022-11-27
  Administered 2022-11-27: 15 mg via INTRAVENOUS
  Filled 2022-11-27: qty 1

## 2022-11-27 MED ORDER — ARIPIPRAZOLE 10 MG PO TABS
10.0000 mg | ORAL_TABLET | Freq: Every day | ORAL | Status: DC
Start: 2022-11-28 — End: 2022-12-02
  Administered 2022-11-28 – 2022-12-02 (×5): 10 mg via ORAL
  Filled 2022-11-27 (×5): qty 1

## 2022-11-27 MED ORDER — SERTRALINE HCL 50 MG PO TABS
50.0000 mg | ORAL_TABLET | Freq: Every day | ORAL | Status: DC
Start: 2022-11-28 — End: 2022-12-02
  Administered 2022-11-28 – 2022-12-02 (×5): 50 mg via ORAL
  Filled 2022-11-27 (×5): qty 1

## 2022-11-27 MED ORDER — OXYCODONE HCL 5 MG PO TABS
5.0000 mg | ORAL_TABLET | ORAL | Status: DC | PRN
Start: 2022-11-27 — End: 2022-11-29
  Administered 2022-11-28 – 2022-11-29 (×3): 5 mg via ORAL
  Filled 2022-11-27 (×3): qty 1

## 2022-11-27 MED ORDER — BENZOCAINE-MENTHOL MT LOZG (WRAP)
1.0000 | LOZENGE | OROMUCOSAL | Status: DC | PRN
Start: 2022-11-27 — End: 2022-12-02
  Administered 2022-11-28 – 2022-12-02 (×3): 1 via BUCCAL
  Filled 2022-11-27 (×3): qty 1

## 2022-11-27 MED ORDER — MELATONIN 3 MG PO TABS
3.0000 mg | ORAL_TABLET | Freq: Every evening | ORAL | Status: DC | PRN
Start: 2022-11-27 — End: 2022-12-02
  Administered 2022-11-28: 3 mg via ORAL
  Filled 2022-11-27 (×2): qty 1

## 2022-11-27 MED ORDER — CARVEDILOL 25 MG PO TABS
25.0000 mg | ORAL_TABLET | Freq: Two times a day (BID) | ORAL | Status: DC
Start: 2022-11-27 — End: 2022-12-02
  Administered 2022-11-28 – 2022-12-02 (×10): 25 mg via ORAL
  Filled 2022-11-27 (×10): qty 1

## 2022-11-27 MED ORDER — POTASSIUM CHLORIDE 10 MEQ/100ML IV SOLN
10.0000 meq | INTRAVENOUS | Status: DC | PRN
Start: 2022-11-27 — End: 2022-11-27

## 2022-11-27 MED ORDER — BUMETANIDE 1 MG PO TABS
2.0000 mg | ORAL_TABLET | Freq: Two times a day (BID) | ORAL | Status: DC
Start: 2022-11-27 — End: 2022-11-29
  Administered 2022-11-28 (×3): 2 mg via ORAL
  Filled 2022-11-27 (×6): qty 2

## 2022-11-27 MED ORDER — MAGNESIUM SULFATE IN D5W 1-5 GM/100ML-% IV SOLN
1.0000 g | INTRAVENOUS | Status: DC | PRN
Start: 2022-11-27 — End: 2022-11-27

## 2022-11-27 MED ORDER — VALSARTAN 80 MG PO TABS
320.0000 mg | ORAL_TABLET | Freq: Every day | ORAL | Status: DC
Start: 2022-11-28 — End: 2022-11-28

## 2022-11-27 MED ORDER — SODIUM BICARBONATE 650 MG PO TABS
650.0000 mg | ORAL_TABLET | Freq: Two times a day (BID) | ORAL | Status: DC
Start: 2022-11-28 — End: 2022-11-28
  Administered 2022-11-28: 650 mg via ORAL
  Filled 2022-11-27: qty 1

## 2022-11-27 MED ORDER — SALINE SPRAY 0.65 % NA SOLN
2.0000 | NASAL | Status: DC | PRN
Start: 2022-11-27 — End: 2022-12-02

## 2022-11-27 MED ORDER — TRAZODONE HCL 50 MG PO TABS
50.0000 mg | ORAL_TABLET | Freq: Every evening | ORAL | Status: DC
Start: 2022-11-27 — End: 2022-12-02
  Administered 2022-11-28 – 2022-12-01 (×5): 50 mg via ORAL
  Filled 2022-11-27 (×5): qty 1

## 2022-11-27 MED ORDER — FOLIC ACID 1 MG PO TABS
1.0000 mg | ORAL_TABLET | Freq: Every day | ORAL | Status: DC
Start: 2022-11-28 — End: 2022-12-02
  Administered 2022-11-28 – 2022-12-02 (×5): 1 mg via ORAL
  Filled 2022-11-27 (×5): qty 1

## 2022-11-27 NOTE — ED Notes (Signed)
BP 213/95 Dr. Fabio Neighbors informed and aware via secure chat.

## 2022-11-27 NOTE — ED Triage Notes (Signed)
[name]  Provider in Triage Note        Patient Name: [name]    Chief Complaint:   Chief Complaint   Patient presents with    Shortness of Breath    Chest Pain    Back Pain       HPI: [name] is a 42 y.o. female, who has had a rapid medical screening evaluation initiated by myself.  Bilateral flank pain and back pain x 3 days ago. Pt states that she had chronic kidney problems, has not had transplant yet. A few days ago, she started having flank pain, back pain, and a significant dry cough. Reporting chest pain and shortness of breath.  Hx of PE. Takes Eliquis daily.     Medical/Surgical/Social history: as per HPI    Vitals: BP (!) 197/103   Pulse 90   Temp 98.6 F (37 C) (Oral)   Resp 22   SpO2 99%     Pertinent brief exam:   Examination of area of concern: non toxic appearing and alert, respirations even and unlabored     Preliminary orders: labs, EKG, ua, CXR     Symptom based preliminary diagnosis/MDM: flank pain, CP, SOB    Patient advised to remain in the ED until further evaluation can be performed. Patient instructed to notify staff of any changes in condition while waiting.  This assessment is an initial evaluation to expedite care.

## 2022-11-27 NOTE — ED Triage Notes (Signed)
[name] is a 42 y.o. female presenting today with c/o bilateral flank pain and back pain x 3 days ago. Pt states that she had chronic kidney problems, has not had transplant yet. A few days ago, she started having flank pain, back pain, and a significant dry cough. Hx of Pe's, also started having significant chest pain and shortness of breath.     BP (!) 197/103   Pulse 90   Temp 98.6 F (37 C) (Oral)   Resp 22   SpO2 99%

## 2022-11-27 NOTE — H&P (Signed)
[facility]      Patient: [name]  Date: 11/27/2022   DOB: [birth year]  Date of Admission: 11/27/2022   MRN: [id number]  Attending: Candice Camp, DNP NP         Chief Complaint   Patient presents with    Shortness of Breath    Chest Pain    Back Pain      History Gathered From: patient    HISTORY AND PHYSICAL     [name] is a 42 y.o. female with a PMHx of DVT on apixaban, CKD 4, DMII on insulin, HFpEF, HTN, HLD, reports 1 week history of cough with small amount of clear sputum production, followed by intermittent chest heaviness, worse with activity, improves with rest. She presented to the ED earlier this evening and was reportedly wheezy on arrival and hypertensive. She was given nebs and IV hydralazine. Work up was significant for WBC 15K, hgb 9.1 (at baseline), glucose 271, creatinine 2.1 (at baseline), potassium 5.5, proBNP 1368. CXR was negative for acute process. Patient was given 40mg  IV lasix and hospitalist was asked to admit her for observation.   Patient seen and examined. She denies chest pain at present. Only current complaint is cough.     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 kr    CESAREAN SECTION      x 2    CHOLECYSTECTOMY      EYE SURGERY Bilateral     cataract    HYSTERECTOMY  12/2019    RELEASE, CARPAL TUNNEL Left 07/10/2022    Procedure: RELEASE, CARPAL TUNNEL;  Surgeon: Romilda Joy, MD;  Location: [facility];  Service: Plastics;  Laterality: Left;    REPAIR, UPPER EXTREMITY, TENDON & NERVE Right 12/05/2021    Procedure: REPAIR, RIGHT ULNAR NERVE AND NEUROPLASTY @ ELBOW AND WRIST, RIGHT OPEN CARPAL TUNNEL RELEASE;  Surgeon: Romilda Joy, MD;  Location: [facility];  Service: Plastics;  Laterality: Right;    REPAIR, UPPER EXTREMITY, TENDON & NERVE Left 07/10/2022    Procedure: LEFT ULNAR NERVE NEUROPLASTY AT ELBOW AND WRIST, LEFT OPEN CARPAL TUNNEL RELEASE;  Surgeon: Romilda Joy, MD;  Location: [facility];  Service: Plastics;  Laterality: Left;    UTERINE RUPTURE  Prior to Admission medications    Medication Sig Start Date End Date Taking? Authorizing Provider   albuterol sulfate HFA (PROVENTIL) 108 (90 Base) MCG/ACT inhaler Inhale 2 puffs into the lungs every 4 (four) hours as needed for Wheezing 03/18/21   Beola Cord, PA   Alcohol Swabs Pads Use as indicated for insulin injections 08/13/20   Forest Becker, MD   apixaban (ELIQUIS) 5 MG Take 1 tablet (5 mg) by mouth every 12 (twelve) hours    [provider]   ARIPiprazole (ABILIFY) 5 MG tablet Take 2 tablets (10 mg) by mouth daily 09/24/21   [provider]   aspirin 81 MG chewable tablet Chew 1 tablet (81 mg) by mouth daily 12/25/20   [provider]   atorvastatin (LIPITOR) 40 MG tablet Take 2 tablets (80 mg) by mouth nightly 09/05/21   [provider]   bumetanide (BUMEX) 2 MG tablet Take 1 tablet (2 mg total) by mouth 2 (two) times daily 08/21/20   Lisette Grinder, NP   carvedilol (COREG) 25 MG tablet Take 1 tablet (25 mg) by mouth 2 (two) times daily    [provider]   diclofenac Sodium (VOLTAREN) 1 % Gel  topical gel Apply 2 g topically 4 (four) times daily 02/27/22   Rochel Brome, DO   docusate sodium (COLACE) 100 MG capsule Take 1 capsule (100 mg) by mouth nightly 09/05/21   [provider]   DULoxetine (CYMBALTA) 20 MG capsule Take 1 capsule (20 mg) by mouth daily 09/24/21   [provider]   EPINEPHrine 0.15 MG/0.3ML injection Inject 0.3 mLs (0.15 mg) into the muscle as needed 09/05/21   [provider]   folic acid (FOLVITE) 1 MG tablet Take 1 tablet (1 mg) by mouth daily    [provider]   insulin detemir (Levemir) 100 UNIT/ML injection Inject 50 Units into the skin every 12 (twelve) hours  Patient taking differently: Inject 53 Units into the skin every 12 (twelve) hours 08/13/20   Forest Becker, MD   insulin lispro (HumaLOG) 100 UNIT/ML injection Inject 25 Units into the skin 3 (three) times daily before meals  Patient taking differently: Inject 25 Units into the skin 3 (three) times daily before meals 08/13/20   Forest Becker, MD   Insulin Syringe-Needle U-100 27G X 1/2" 1 ML Misc Inject 1 Dose into the skin 4 (four) times daily 08/13/20   Forest Becker, MD   montelukast (SINGULAIR) 10 MG tablet Take 1 tablet (10 mg) by mouth nightly    [provider]   olmesartan (BENICAR) 40 MG tablet Take 1 tablet (40 mg) by mouth daily    [provider]   pregabalin (LYRICA) 150 MG capsule Take 1 capsule (150 mg) by mouth 2 (two) times daily    [provider]   semaglutide (WEGOVY) 1.7 MG/0.75ML injection Inject 0.5 mLs (1 mg) into the skin once a week  Patient not taking: Reported on 07/02/2022    [provider]   sertraline (ZOLOFT) 50 MG tablet Take 1 tablet (50 mg) by mouth daily    [provider]   sodium bicarbonate 650 MG tablet Take 1 tablet (650 mg) by mouth 2 (two) times daily    [provider]   traZODone (DESYREL) 50 MG tablet Take 1 tablet (50 mg) by mouth nightly    [provider]    Azelastine-Fluticasone 137-50 MCG/ACT Suspension 1 spray by Nasal route 2 (  two) times daily 05/17/21 11/27/22  [provider]   baclofen (LIORESAL) 10 MG tablet Take 1 tablet (10 mg) by mouth as needed  11/27/22  [provider]   buPROPion XL (WELLBUTRIN XL) 150 MG 24 hr tablet Take 1 tablet (150 mg) by mouth daily 03/29/21 11/27/22  [provider]   diclofenac (VOLTAREN) 50 MG EC tablet Take 1 tablet (50 mg) by mouth daily 09/05/21 11/27/22  [provider]   Ruthe Mannan 200-5 MCG/ACT Aerosol Inhale 2 puffs into the lungs daily 09/05/21 11/27/22  [provider]   hydrALAZINE (APRESOLINE) 50 MG tablet Take 1 tablet (50 mg) by mouth 2 (two) times daily  11/27/22  [provider]   hydrOXYzine (ATARAX) 10 MG tablet Take 1 tablet (10 mg) by mouth every 6 (six) hours as needed for Anxiety  11/27/22  [provider]   lidocaine (LIDODERM) 5 % Place 1 patch onto the skin every 24 hours Remove & Discard patch within 12 hours or as directed by MD 04/29/21 11/27/22  Arvilla Meres, MD   meclizine (ANTIVERT) 25 MG tablet Take 1 tablet (25 mg) by mouth as needed  11/27/22  [provider]   metOLazone (ZAROXOLYN) 10 MG tablet Take 1 tablet (10 mg) by mouth daily 12/25/20 11/27/22  [provider]   omeprazole (PriLOSEC) 40 MG capsule Take 1 capsule (40 mg) by mouth nightly  11/27/22  [provider]   Ozempic, 0.25 or 0.5 MG/DOSE, 2 MG/3ML Solution Pen-injector Inject into the skin Once each week on Sunday morning 02/28/22 11/27/22  [provider]   topiramate (TOPAMAX) 100 MG tablet Take 1 tablet (100 mg) by mouth daily  11/27/22  [provider]   valACYclovir (VALTREX) 1000 MG tablet Take 1 tablet (1,000 mg) by mouth as needed  11/27/22  [provider]       Allergies   Allergen Reactions    Amlodipine Hives    Bactrim [Sulfamethoxazole-Trimethoprim] Hives, Respiratory Distress and Edema    Glipizide Edema     Throat swelling     Lisinopril Edema     Tongue swelling    Losartan Other (See Comments), Swelling and Edema       Family History   Problem Relation Age of Onset    Heart disease Mother     Hypertension Mother     Diabetes Mother     Asthma Father     Hypertension Father     Diabetes Daughter     Asthma Son     Cancer Maternal Aunt     Cancer Maternal Grandmother        Social History     Tobacco Use    Smoking status: Never    Smokeless tobacco: Never   Vaping Use    Vaping Use: Never used   Substance Use Topics    Alcohol use: Never    Drug use: Never       REVIEW OF SYSTEMS   Gen: No fever, chills, malaise, fatigue  HEENT: No vision changes, runny nose, sore throat, difficulty swallowing, or ear pain  Neck: No neck stiffness  Resp: + cough, shortness of breath, & wheezing. Not on home oxygen.  Cardio: + chest pain. No palpitations, edema, orthopnea, or PND  Abd: No abdominal pain, nausea, vomiting, diarrhea, constipation, changes in bowel habits, melena, hematochezia, or hematemesis  GU: no dysuria, frequency, urgency, hematuria  MSK: No unusual back pain or joint pain  Derm: No rashes or  itching  Neuro: No headache, confusion, seizures, syncope, or speech changes  Heme/Lymph: No easy bruising or bleeding tendencies. No swollen lymph nodes  Endo: No heat or cold intolerance. Polydypsia or polyuria  Psych: No acute depression or anxiety.    PHYSICAL EXAM   Vital Signs (most recent): BP (!) 172/98   Pulse 77   Temp 98.2 F (36.8 C) (Oral)   Resp 17   SpO2 99%   Constitutional: No apparent distress, Neatly groomed and appears stated age. Patient speaks freely in full sentences.   HEENT: normocephalic, atraumatic, PERRL, no scleral icterus, no conjunctival pallor, no nasal discharge  Neck: trachea midline, supple, no JVD  Cardiovascular: RRR, normal S1 S2  Respiratory: Normal rate. No increased work of breathing. Diminished bilateral bases. Not on oxygen.  Gastrointestinal: +BS, non-distended, soft, non-tender  Genitourinary:  no foley catheter, no bladder distension, no CVA tenderness  Musculoskeletal: ROM and motor strength grossly normal  Ext: No edema, pulses 2+/equal bilaterally  Neurologic: alert, oriented x 4, no focal deficits  Skin: normal color, warm, dry  Psychiatric: calm, cooperative, appropriate mood/affect    LABS & IMAGING     Recent Results (from the past 24 hour(s))   Comprehensive metabolic panel    Collection Time: 11/27/22  7:56 PM   Result Value Ref Range    Glucose 271 (H) 70 - 100 mg/dL    BUN 32.0 (H) 7.0 - 21.0 mg/dL    Creatinine 2.1 (H) 0.4 - 1.0 mg/dL    Sodium 136 135 - 145 mEq/L    Potassium 5.5 (H) 3.5 - 5.3 mEq/L    Chloride 113 (H) 99 - 111 mEq/L    CO2 17 17 - 29 mEq/L    Calcium 7.9 (L) 8.5 - 10.5 mg/dL    Protein, Total 5.8 (L) 6.0 - 8.3 g/dL    Albumin 2.5 (L) 3.5 - 5.0 g/dL    AST (SGOT) 11 5 - 41 U/L    ALT 9 0 - 55 U/L    Alkaline Phosphatase 97 37 - 117 U/L    Bilirubin, Total 0.2 0.2 - 1.2 mg/dL    Globulin 3.3 2.0 - 3.6 g/dL    Albumin/Globulin Ratio 0.8 (L) 0.9 - 2.2    Anion Gap 6.0 5.0 - 15.0    eGFR 29.4 (A) >=60 mL/min/1.73 m2   CBC and differential    Collection Time: 11/27/22  7:56 PM   Result Value Ref Range    WBC 15.48 (H) 3.10 - 9.50 x10 3/uL    Hgb 9.1 (L) 11.4 - 14.8 g/dL    Hematocrit 31.4 (L) 34.7 - 43.7 %    Platelets 383 (H) 142 - 346 x10 3/uL    RBC 3.60 (L) 3.90 - 5.10 x10 6/uL    MCV 87.2 78.0 - 96.0 fL    MCH 25.3 25.1 - 33.5 pg    MCHC 29.0 (L) 31.5 - 35.8 g/dL    RDW 15 11 - 15 %    MPV 10.2 8.9 - 12.5 fL    Instrument Absolute Neutrophil Count 11.23 (H) 1.10 - 6.33 x10 3/uL    Neutrophils 72.6 None %    Lymphocytes Automated 18.3 None %    Monocytes 5.7 None %    Eosinophils Automated 2.2 None %    Basophils Automated 0.4 None %    Immature Granulocytes 0.8 None %    Nucleated RBC 0.0 0.0 - 0.0 /100 WBC    Neutrophils Absolute 11.23 (H) 1.10 -  6.33 x10 3/uL    Lymphocytes Absolute Automated 2.84 0.42 - 3.22 x10 3/uL    Monocytes Absolute Automated 0.89 (H) 0.21 - 0.85 x10  3/uL    Eosinophils Absolute Automated 0.34 0.00 - 0.44 x10 3/uL    Basophils Absolute Automated 0.06 0.00 - 0.08 x10 3/uL    Immature Granulocytes Absolute 0.12 (H) 0.00 - 0.07 x10 3/uL    Absolute NRBC 0.00 0.00 - 0.00 x10 3/uL   High Sensitivity Troponin-I at 0 hrs    Collection Time: 11/27/22  7:56 PM   Result Value Ref Range    hs Troponin-I 8.9 SEE BELOW ng/L   Lipase    Collection Time: 11/27/22  7:56 PM   Result Value Ref Range    Lipase 39 8 - 78 U/L   COVID-19 (SARS-CoV-2) and Influenza A/B, NAA ([facility])    Collection Time: 11/27/22  7:56 PM    Specimen: Nasopharyngeal; Culturette   Result Value Ref Range    Purpose of COVID testing Diagnostic -PUI     SARS-CoV-2 Specimen Source Nasal Swab     SARS CoV 2 Overall Result Not Detected     Influenza A Not Detected     Influenza B Not Detected    NT-proBNP    Collection Time: 11/27/22  7:56 PM   Result Value Ref Range    NT-proBNP 1,368 (H) 0 - 125 pg/mL   High Sensitivity Troponin-I at 2 hrs with calculated Delta    Collection Time: 11/27/22  9:52 PM   Result Value Ref Range    hs Troponin-I 10.7 SEE BELOW ng/L    hs Troponin-I Delta 1.8 ng/L   Urinalysis Reflex to Microscopic Exam- Reflex to Culture    Collection Time: 11/27/22 10:15 PM   Result Value Ref Range    Urine Type Urine, Clean Ca     Color, UA Yellow     Clarity, UA Clear Clear - Hazy    Specific Gravity UA 1.025 1.001 - 1.035    Urine pH 6.0 5.0 - 8.0    Leukocyte Esterase, UA Negative Negative    Nitrite, UA Negative Negative    Protein, UR >=300 (A) Negative    Glucose, UA 250 (A) Negative    Ketones UA Negative Negative    Urobilinogen, UA 0.2 0.2 - 2.0 mg/dL    Bilirubin, UA Negative Negative    Blood, UA Trace-intact (A) Negative    RBC, UA 6-10 (A) 0 - 5 /hpf    WBC, UA 6-10 (A) 0 - 5 /hpf    Squamous Epithelial Cells, Urine 0-5 0 - 25 /hpf    Hyaline Casts, UA 3-5 (A) 0 - 5 /lpf    Urine Mucus Present None   Glucose Whole Blood - POCT    Collection Time: 11/28/22 12:56 AM   Result Value  Ref Range    Whole Blood Glucose POCT 243 (H) 70 - 100 mg/dL     IMAGING:  Upon my review:   Chest 2 Views    Result Date: 11/27/2022  1. No acute disease. Loel Ro, MD 11/27/2022 8:18 PM       CARDIAC:  EKG Interpretation (upon my review):  NSR @ 89, on change from prior.    Markers:  Recent Labs   Lab 11/27/22  2152 11/27/22  1956   hs Troponin-I 10.7 8.9   hs Troponin-I Delta 1.8  --        EMERGENCY DEPARTMENT COURSE:  Orders Placed This Encounter  Procedures    COVID-19 (SARS-CoV-2) and Influenza A/B, NAA ([facility])    Culture Blood Aerobic and Anaerobic    Culture Blood Aerobic and Anaerobic    Chest 2 Views    Comprehensive metabolic panel    CBC and differential    High Sensitivity Troponin-I at 0 hrs    High Sensitivity Troponin-I at 2 hrs with calculated Delta    Urinalysis Reflex to Microscopic Exam- Reflex to Culture    Lipase    NT-proBNP    Hemoglobin A1C    Diet Consistent Carbohydrate and Heart Healthy    Cardiac monitoring (ED ONLY)    Notify physician    Notify physician for Signs/Symptoms of Bleeding    Apply Anticoagulation Armband    Weigh patient    Assess if patient received anticoagulants in the last 12 hours    Vital signs    Pulse Oximetry    Progressive Mobility Protocol    Notify physician    I/O    Height    Weight    Skin assessment    Notify physician (Critical Blood Glucose Value)    Notify physician (Communication: Document Abnormal Blood Glucose)    POCT order (PRN hypoglycemia)    Adult Hypoglycemia Treatment Algorithm    Place sequential compression device    Maintain sequential compression device    Education: Activity    Education: Disease Process & Condition    Education: Pain Management    Education: Falls Risk    Education: Smoking Cessation    Telemetry Box Communication    Target Glucose Goals    Nursing communication    NSG Communication: Glucose POCT order (PRN hypoglycemia)    Notify physician (Critical Blood Glucose Value)    Notify physician (Communication:  Document Abnormal Blood Glucose)    POCT order (PRN hypoglycemia)    Adult Hypoglycemia Treatment Algorithm    NSG Communication: Glucose POCT order    NSG Communication: Glucose POCT order    Daily weights    Full Code    Glucose Whole Blood - POCT    ECG 12 Lead    Saline lock IV    Saline lock IV    Admit to Observation    BLEEDING PRECAUTIONS    Supervise For Meals Frequency: All meals       ASSESSMENT & PLAN     [name] is a 42 y.o. female admitted with chest pressure, cough, leukocytosis.    Patient Active Hospital Problem List:    Cough  Asthma  Leukocytosis   - no wheezing on exam   - denies fever   - CXR with no acute process   - flu, covid negative   - nebs PRN   - check procal   - resume singulair, flovent   - tessalon perles PRN    Chest pain/pressure   - trops negative x 2   - resume home aspirin, high intensity statin, BB   - monitor on tele   - consider cardiology consult in a.m.    HFpEF  HTN  HLD   - echo 01/2022 w/ EF [id number], grade 1 diastolic dysfunction   - received 40mg  IV lasix in ED   - appears euvolemic on exam   - resume home bumex 2mg  BID   - resume carvedilol, statin, ARB    CKD 4  Hyperkalemia   - cr appears at/near baseline   - monitor I&O   - avoid nephrotoxins   - received  IV lasix in ED   - recheck BMP in a.m.   - monitor on tele       DMII  Hyperglycemia   - home levemir dose is 53 units BID   - will cut back to 30 units BID while IP on carb consistent diet for now   - adjust as needed    - SSI   - check A1C in a.m.    H/o DVT   - resume home apixaban 5mg  BID    Anemia of chronic disease   - hgb stable   - follow CBC      Patient has BMI=There is no height or weight on file to calculate BMI.  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 Hematological)   Medication Dose Route Last Admin    apixaban (ELIQUIS) tablet 5 mg  5 mg Oral         Code Status: Full Code    Patient Class: OBSERVATION.    Anticipated medical stability for discharge: 24 - 48 Hours    Signed,  Candice Camp, DNP NP    11/28/2022 1:35 AM  Time Elapsed: 64 minutes

## 2022-11-27 NOTE — H&P (Incomplete)
[facility]      Patient: [name]  Date: 11/27/2022   DOB: 1981-05-06  Date of Admission: 11/27/2022   MRN: [id number]  Attending: Georgiana Shore, MD         Chief Complaint   Patient presents with    Shortness of Breath    Chest Pain    Back Pain        History Gathered From: ***    HISTORY AND PHYSICAL     [name] is a 42 y.o. female with a PMHx of *** who presented with ***.    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 kr    CESAREAN SECTION      x 2    CHOLECYSTECTOMY      EYE SURGERY Bilateral     cataract    HYSTERECTOMY  12/2019    RELEASE, CARPAL TUNNEL Left 07/10/2022    Procedure: RELEASE, CARPAL TUNNEL;  Surgeon: Cathrine Muster, MD;  Location: [facility];  Service: Plastics;  Laterality: Left;    REPAIR, UPPER EXTREMITY, TENDON & NERVE Right 12/05/2021    Procedure: REPAIR, RIGHT ULNAR NERVE AND NEUROPLASTY @ ELBOW AND WRIST, RIGHT OPEN CARPAL TUNNEL RELEASE;  Surgeon: Cathrine Muster, MD;  Location[facility];  Service: Plastics;  Laterality: Right;    REPAIR, UPPER EXTREMITY, TENDON & NERVE Left 07/10/2022    Procedure: LEFT ULNAR NERVE  NEUROPLASTY AT ELBOW AND WRIST, LEFT OPEN CARPAL TUNNEL RELEASE;  Surgeon: Cathrine Muster, MD;  Location: [facility];  Service: Plastics;  Laterality: Left;    UTERINE RUPTURE         Prior to Admission medications    Medication Sig Start Date End Date Taking? Authorizing Provider   albuterol sulfate HFA (PROVENTIL) 108 (90 Base) MCG/ACT inhaler Inhale 2 puffs into the lungs every 4 (four) hours as needed for Wheezing 03/18/21   Maurice Small R, [place]   Alcohol Swabs Pads Use as indicated for insulin injections 08/13/20   Anola Gurney, MD   apixaban (ELIQUIS) 5 MG Take 1 tablet (5 mg) by mouth every 12 (twelve) hours    [provider]   ARIPiprazole (ABILIFY) 5 MG tablet Take 2 tablets (10 mg) by mouth daily 09/24/21  [provider]   aspirin 81 MG chewable tablet Chew 1 tablet (81 mg) by mouth daily 12/25/20   [provider]   atorvastatin (LIPITOR) 40 MG tablet Take 2 tablets (80 mg) by mouth nightly 09/05/21   [provider]   Azelastine-Fluticasone 137-50 MCG/ACT Suspension 1 spray by Nasal route 2 (two) times daily 05/17/21   [provider]   baclofen (LIORESAL) 10 MG tablet Take 1 tablet (10 mg) by mouth as needed    [provider]   bumetanide (BUMEX) 2 MG tablet Take 1 tablet (2 mg total) by mouth 2 (two) times daily 08/21/20   Pilar Plate, NP   buPROPion XL (WELLBUTRIN XL) 150 MG 24 hr tablet Take 1 tablet (150 mg) by mouth daily 03/29/21   [provider]   carvedilol (COREG) 25 MG tablet Take 1 tablet (25 mg) by mouth 2 (two) times daily    [provider]   diclofenac (VOLTAREN) 50 MG EC tablet Take 1 tablet (50 mg) by mouth daily 09/05/21   [provider]   diclofenac Sodium (VOLTAREN) 1 % Gel topical gel Apply 2 g topically 4 (four) times daily 02/27/22   Marlis Edelson, DO   docusate sodium (COLACE) 100 MG capsule Take 1 capsule (100 mg) by mouth nightly 09/05/21   [provider]   Elwin Sleight 200-5  MCG/ACT Aerosol Inhale 2 puffs into the lungs daily 09/05/21   [provider]   DULoxetine (CYMBALTA) 60 MG capsule Take 1 capsule (60 mg) by mouth daily 09/24/21   [provider]   EPINEPHrine 0.15 MG/0.3ML injection Inject 0.3 mLs (0.15 mg) into the muscle as needed 09/05/21   [provider]   folic acid (FOLVITE) 1 MG tablet Take 1 tablet (1 mg) by mouth daily    [provider]   hydrALAZINE (APRESOLINE) 50 MG tablet Take 1 tablet (50 mg) by mouth 2 (two) times daily    [provider]   hydrOXYzine (ATARAX) 10 MG tablet Take 1 tablet (10 mg) by mouth every 6 (six) hours as needed for Anxiety    [provider]   insulin detemir (Levemir) 100 UNIT/ML injection Inject 50 Units into the skin every 12 (twelve) hours  Patient taking differently: Inject 53 Units into the skin every 12 (twelve) hours 08/13/20   Anola Gurney, MD   insulin lispro (HumaLOG) 100 UNIT/ML injection Inject 25 Units into the skin 3 (three) times daily before meals  Patient taking differently: Inject 25 Units into the skin 3 (three) times daily before meals 08/13/20   Anola Gurney, MD   Insulin Syringe-Needle U-100 27G X 1/2" 1 ML Misc Inject 1 Dose into the skin 4 (four) times daily 08/13/20   Anola Gurney, MD   lidocaine (LIDODERM) 5 % Place 1 patch onto the skin every 24 hours Remove & Discard patch within 12 hours or as directed by MD 04/29/21   Maryella Shivers, MD   meclizine (ANTIVERT) 25 MG tablet Take 1 tablet (25 mg) by mouth as needed    [provider]   metOLazone (ZAROXOLYN) 10 MG tablet Take 1 tablet (10 mg) by mouth daily 12/25/20   [provider]   montelukast (SINGULAIR) 10 MG tablet Take 1 tablet (10 mg) by mouth nightly    [provider]   olmesartan (BENICAR) 40 MG tablet Take 1 tablet (40 mg) by mouth daily    [provider]   omeprazole (  PriLOSEC) 40 MG capsule Take 1 capsule (40 mg) by mouth nightly    [provider]   Ozempic, 0.25 or 0.5 MG/DOSE, 2 MG/3ML Solution Pen-injector Inject into the skin Once each week on Sunday morning 02/28/22   [provider]   semaglutide Mancel Parsons) 1 MG/0.5ML injection Inject 0.5 mLs (1 mg) into the skin once a week  Patient not taking: Reported on 07/02/2022    [provider]   topiramate (TOPAMAX) 100 MG tablet Take 1 tablet (100 mg) by mouth daily    [provider]   traZODone (DESYREL) 150 MG tablet Take 1 tablet (150 mg) by mouth nightly    [provider]   valACYclovir (VALTREX) 1000 MG tablet Take 1 tablet (1,000 mg) by mouth as needed    [provider]       Allergies   Allergen Reactions    Amlodipine Hives    Bactrim [Sulfamethoxazole-Trimethoprim] Hives, Respiratory Distress and Edema    Glipizide Edema     Throat swelling    Lisinopril Edema     Tongue swelling    Losartan Other (See Comments), Swelling and Edema       Family History   Problem Relation Age of Onset    Heart disease Mother     Hypertension Mother     Diabetes Mother     Asthma Father     Hypertension Father     Diabetes Daughter     Asthma Son     Cancer Maternal Aunt     Cancer Maternal Grandmother        Social History     Tobacco Use    Smoking status: Never    Smokeless tobacco: Never   Vaping Use    Vaping Use: Never used   Substance Use Topics    Alcohol use: Never    Drug use: Never       REVIEW OF SYSTEMS   Positive for: ***  Negative for: ***  All ROS completed and otherwise negative.    PHYSICAL EXAM     Vital Signs (most recent): BP 193/84   Pulse 85   Temp 98.7 F (37.1 C) (Oral)   Resp 16   SpO2 100%   Constitutional: {Appearance:123945}. Patient speaks freely in full sentences.   HEENT: NC/AT, PERRL, no scleral icterus or conjunctival pallor, no nasal discharge, MMM, oropharynx without erythema or exudate  Neck: trachea midline, supple, no cervical or supraclavicular lymphadenopathy or masses  Cardiovascular: RRR, normal S1 S2, no murmurs,  gallops, palpable thrills, no JVD, Non-displaced PMI.  Respiratory: Normal rate. No retractions or increased work of breathing. Clear to auscultation and percussion bilaterally.  Gastrointestinal: +BS, non-distended, soft, non-tender, no rebound or guarding, no hepatosplenomegaly  Genitourinary: no suprapubic or costovertebral angle tenderness  Musculoskeletal: ROM and motor strength grossly normal. No clubbing, edema, or cyanosis. DP and radial pulses 2+ and symmetric.  Neurologic: EOMI, CN 2-12 grossly intact. no gross motor or sensory deficits  Psychiatric: AAOx3, affect and mood appropriate. The patient is alert, interactive, appropriate.    LABS & IMAGING     Recent Results (from the past 24 hour(s))   Comprehensive metabolic panel    Collection Time: 11/27/22  7:56 PM   Result Value Ref Range    Glucose 271 (H) 70 - 100 mg/dL    BUN 32.0 (H) 7.0 - 21.0 mg/dL    Creatinine 2.1 (H) 0.4 - 1.0 mg/dL    Sodium 136 135 -  145 mEq/L    Potassium 5.5 (H) 3.5 - 5.3 mEq/L    Chloride 113 (H) 99 - 111 mEq/L    CO2 17 17 - 29 mEq/L    Calcium 7.9 (L) 8.5 - 10.5 mg/dL    Protein, Total 5.8 (L) 6.0 - 8.3 g/dL    Albumin 2.5 (L) 3.5 - 5.0 g/dL    AST (SGOT) 11 5 - 41 U/L    ALT 9 0 - 55 U/L    Alkaline Phosphatase 97 37 - 117 U/L    Bilirubin, Total 0.2 0.2 - 1.2 mg/dL    Globulin 3.3 2.0 - 3.6 g/dL    Albumin/Globulin Ratio 0.8 (L) 0.9 - 2.2    Anion Gap 6.0 5.0 - 15.0    eGFR 29.4 (A) >=60 mL/min/1.73 m2   CBC and differential    Collection Time: 11/27/22  7:56 PM   Result Value Ref Range    WBC 15.48 (H) 3.10 - 9.50 x10 3/uL    Hgb 9.1 (L) 11.4 - 14.8 g/dL    Hematocrit 31.4 (L) 34.7 - 43.7 %    Platelets 383 (H) 142 - 346 x10 3/uL    RBC 3.60 (L) 3.90 - 5.10 x10 6/uL    MCV 87.2 78.0 - 96.0 fL    MCH 25.3 25.1 - 33.5 pg    MCHC 29.0 (L) 31.5 - 35.8 g/dL    RDW 15 11 - 15 %    MPV 10.2 8.9 - 12.5 fL    Instrument Absolute Neutrophil Count 11.23 (H) 1.10 - 6.33 x10 3/uL    Neutrophils 72.6 None %    Lymphocytes Automated  18.3 None %    Monocytes 5.7 None %    Eosinophils Automated 2.2 None %    Basophils Automated 0.4 None %    Immature Granulocytes 0.8 None %    Nucleated RBC 0.0 0.0 - 0.0 /100 WBC    Neutrophils Absolute 11.23 (H) 1.10 - 6.33 x10 3/uL    Lymphocytes Absolute Automated 2.84 0.42 - 3.22 x10 3/uL    Monocytes Absolute Automated 0.89 (H) 0.21 - 0.85 x10 3/uL    Eosinophils Absolute Automated 0.34 0.00 - 0.44 x10 3/uL    Basophils Absolute Automated 0.06 0.00 - 0.08 x10 3/uL    Immature Granulocytes Absolute 0.12 (H) 0.00 - 0.07 x10 3/uL    Absolute NRBC 0.00 0.00 - 0.00 x10 3/uL   High Sensitivity Troponin-I at 0 hrs    Collection Time: 11/27/22  7:56 PM   Result Value Ref Range    hs Troponin-I 8.9 SEE BELOW ng/L   Lipase    Collection Time: 11/27/22  7:56 PM   Result Value Ref Range    Lipase 39 8 - 78 U/L   COVID-19 (SARS-CoV-2) and Influenza A/B, NAA ([facility])    Collection Time: 11/27/22  7:56 PM    Specimen: Nasopharyngeal; Culturette   Result Value Ref Range    Purpose of COVID testing Diagnostic -PUI     SARS-CoV-2 Specimen Source Nasal Swab     SARS CoV 2 Overall Result Not Detected     Influenza A Not Detected     Influenza B Not Detected    NT-proBNP    Collection Time: 11/27/22  7:56 PM   Result Value Ref Range    NT-proBNP 1,368 (H) 0 - 125 pg/mL   Urinalysis Reflex to Microscopic Exam- Reflex to Culture    Collection Time: 11/27/22 10:15 PM   Result Value  Ref Range    Urine Type Urine, Clean Ca     Color, UA Yellow     Clarity, UA Clear Clear - Hazy    Specific Gravity UA 1.025 1.001 - 1.035    Urine pH 6.0 5.0 - 8.0    Leukocyte Esterase, UA Negative Negative    Nitrite, UA Negative Negative    Protein, UR >=300 (A) Negative    Glucose, UA 250 (A) Negative    Ketones UA Negative Negative    Urobilinogen, UA 0.2 0.2 - 2.0 mg/dL    Bilirubin, UA Negative Negative    Blood, UA Trace-intact (A) Negative    RBC, UA 6-10 (A) 0 - 5 /hpf    WBC, UA 6-10 (A) 0 - 5 /hpf    Squamous Epithelial Cells, Urine 0-5  0 - 25 /hpf    Hyaline Casts, UA 3-5 (A) 0 - 5 /lpf    Urine Mucus Present None       MICROBIOLOGY:  Blood Culture:***  Urine Culture: ***  Antibiotics Started: ***    IMAGING:  Upon my review: ***    CARDIAC:  EKG Interpretation (upon my review):  ***    Markers:  Recent Labs   Lab 11/27/22  1956   hs Troponin-I 8.9       EMERGENCY DEPARTMENT COURSE:  Orders Placed This Encounter   Procedures    COVID-19 (SARS-CoV-2) and Influenza A/B, NAA ([facility])    Chest 2 Views    Comprehensive metabolic panel    CBC and differential    High Sensitivity Troponin-I at 0 hrs    High Sensitivity Troponin-I at 2 hrs with calculated Delta    Urinalysis Reflex to Microscopic Exam- Reflex to Culture    Lipase    NT-proBNP    Cardiac monitoring (ED ONLY)    ECG 12 Lead    Saline lock IV       ASSESSMENT & PLAN     [name] is a 42 y.o. female admitted with <principal problem not specified>.    Patient Active Hospital Problem List:     No active hospital problems.           {Vanishing Tip  Patient meets BMI criteria for a weight-based diagnosis. 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 :16109604}  Patient has BMI=There is no height or weight on file to calculate BMI.  Diagnosis: {BMI High:57938} based on BMI criteria      {Vanishing Tip  Potassium value is high  >5.3 mEq/L = Hyperkalemia :54098119}  Recent Labs     11/27/22  1956   Potassium 5.5*     {Diagnosis:30448751}      Nutrition: ***    DVT/VTE Prophylaxis:   Current Facility-Administered Medications (Includes Only Anticoagulants, Misc. Hematological)   Medication Dose Route Last Admin   None       Code Status: Prior    Patient Class: {PATIENTCLASS:64090}    Anticipated medical stability for discharge: {Estimated Discharge:64262}      Signed,  Georgiana Shore, MD    11/27/2022  10:50 PM  Time Elapsed: ***

## 2022-11-27 NOTE — ED to IP RN Note (Signed)
[place]  ED NURSING NOTE FOR THE RECEIVING INPATIENT NURSE   ED NURSE Wynetta Emery RN   [facility]   ED CHARGE RN Theodoro Clock RN   ADMISSION INFORMATION   [name] is a 42 y.o. female admitted with an ED diagnosis of:    1. Asthma exacerbation attacks         Isolation: None   Allergies: Amlodipine, Bactrim [sulfamethoxazole-trimethoprim], Glipizide, Lisinopril, and Losartan   Holding Orders confirmed? Yes   Belongings Documented? Yes   Home medications sent to pharmacy confirmed? N/A   NURSING CARE   Patient Comes From:   Mental Status: Home/Family Care  alert and oriented   ADL: Independent with all ADLs   Ambulation: no difficulty   Pertinent Information  and Safety Concerns:     [place] Violence Risk Level: Low na     CT / NIH   CT Head ordered on this patient?  N/A   NIH/Dysphagia assessment done prior to admission? N/A   VITAL SIGNS (at the time of this note)      Vitals:    11/27/22 2245   BP: 182/89   Pulse: 85   Resp: 16   Temp:    SpO2: 99%
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
## 2022-11-28 ENCOUNTER — Observation Stay: Payer: No Typology Code available for payment source

## 2022-11-28 LAB — BASIC METABOLIC PANEL
Anion Gap: 3 — ABNORMAL LOW (ref 5.0–15.0)
Anion Gap: 7 (ref 5.0–15.0)
BUN: 35 mg/dL — ABNORMAL HIGH (ref 7.0–21.0)
BUN: 36 mg/dL — ABNORMAL HIGH (ref 7.0–21.0)
CO2: 16 mEq/L — ABNORMAL LOW (ref 17–29)
CO2: 17 mEq/L (ref 17–29)
Calcium: 7.4 mg/dL — ABNORMAL LOW (ref 8.5–10.5)
Calcium: 7.5 mg/dL — ABNORMAL LOW (ref 8.5–10.5)
Chloride: 113 mEq/L — ABNORMAL HIGH (ref 99–111)
Chloride: 114 mEq/L — ABNORMAL HIGH (ref 99–111)
Creatinine: 2.5 mg/dL — ABNORMAL HIGH (ref 0.4–1.0)
Creatinine: 2.6 mg/dL — ABNORMAL HIGH (ref 0.4–1.0)
Glucose: 349 mg/dL — ABNORMAL HIGH (ref 70–100)
Glucose: 116 mg/dL — ABNORMAL HIGH (ref 70–100)
Potassium: 5.5 mEq/L — ABNORMAL HIGH (ref 3.5–5.3)
Potassium: 5.2 mEq/L (ref 3.5–5.3)
Sodium: 132 mEq/L — ABNORMAL LOW (ref 135–145)
Sodium: 138 mEq/L (ref 135–145)
eGFR: 23.8 mL/min/{1.73_m2} — AB (ref >=60)
eGFR: 22.7 mL/min/{1.73_m2} — AB (ref >=60)

## 2022-11-28 LAB — WHOLE BLOOD GLUCOSE POCT
Whole Blood Glucose POCT: 228 mg/dL — ABNORMAL HIGH (ref 70–100)
Whole Blood Glucose POCT: 233 mg/dL — ABNORMAL HIGH (ref 70–100)
Whole Blood Glucose POCT: 243 mg/dL — ABNORMAL HIGH (ref 70–100)
Whole Blood Glucose POCT: 299 mg/dL — ABNORMAL HIGH (ref 70–100)
Whole Blood Glucose POCT: 78 mg/dL (ref 70–100)
Whole Blood Glucose POCT: 79 mg/dL (ref 70–100)
Whole Blood Glucose POCT: 84 mg/dL (ref 70–100)

## 2022-11-28 LAB — CBC AND DIFFERENTIAL
Absolute NRBC: 0 10*3/uL (ref 0.00–0.00)
Basophils Absolute Automated: 0.04 10*3/uL (ref 0.00–0.08)
Basophils Automated: 0.3 %
Eosinophils Absolute Automated: 0.35 10*3/uL (ref 0.00–0.44)
Eosinophils Automated: 2.8 %
Hematocrit: 28.6 % — ABNORMAL LOW (ref 34.7–43.7)
Hgb: 8.2 g/dL — ABNORMAL LOW (ref 11.4–14.8)
Immature Granulocytes Absolute: 0.06 10*3/uL (ref 0.00–0.07)
Immature Granulocytes: 0.5 %
Instrument Absolute Neutrophil Count: 8.06 10*3/uL — ABNORMAL HIGH (ref 1.10–6.33)
Lymphocytes Absolute Automated: 3.13 10*3/uL (ref 0.42–3.22)
Lymphocytes Automated: 25.1 %
MCH: 25.7 pg (ref 25.1–33.5)
MCHC: 28.7 g/dL — ABNORMAL LOW (ref 31.5–35.8)
MCV: 89.7 fL (ref 78.0–96.0)
MPV: 10.6 fL (ref 8.9–12.5)
Monocytes Absolute Automated: 0.85 10*3/uL (ref 0.21–0.85)
Monocytes: 6.8 %
Neutrophils Absolute: 8.06 10*3/uL — ABNORMAL HIGH (ref 1.10–6.33)
Neutrophils: 64.5 %
Nucleated RBC: 0 /100 WBC (ref 0.0–0.0)
Platelets: 292 10*3/uL (ref 142–346)
RBC: 3.19 10*6/uL — ABNORMAL LOW (ref 3.90–5.10)
RDW: 15 % (ref 11–15)
WBC: 12.49 10*3/uL — ABNORMAL HIGH (ref 3.10–9.50)

## 2022-11-28 LAB — PROCALCITONIN: Procalcitonin: 0.05 ng/ml (ref 0.00–0.10)

## 2022-11-28 LAB — HEMOLYSIS INDEX(SOFT): Hemolysis Index: 206 Index — ABNORMAL HIGH (ref 0–24)

## 2022-11-28 LAB — PHOSPHORUS: Phosphorus: 5 mg/dL — ABNORMAL HIGH (ref 2.3–4.7)

## 2022-11-28 LAB — HEMOGLOBIN A1C
Average Estimated Glucose: 142.7 mg/dL
Hemoglobin A1C: 6.6 % — ABNORMAL HIGH (ref 4.6–5.6)

## 2022-11-28 LAB — PTH, INTACT: PTH Intact: 195.3 pg/mL — ABNORMAL HIGH (ref 18.0–89.0)

## 2022-11-28 LAB — VITAMIN D, 25 OH, TOTAL: Vitamin D, 25 OH, Total: 8 ng/mL — ABNORMAL LOW (ref 30–100)

## 2022-11-28 MED ORDER — ALBUTEROL SULFATE (2.5 MG/3ML) 0.083% IN NEBU
2.5000 mg | INHALATION_SOLUTION | RESPIRATORY_TRACT | Status: DC | PRN
Start: 2022-11-28 — End: 2022-12-02
  Filled 2022-11-28: qty 3

## 2022-11-28 MED ORDER — PREDNISONE 20 MG PO TABS
20.0000 mg | ORAL_TABLET | Freq: Every morning | ORAL | Status: DC
Start: 2022-11-29 — End: 2022-11-29
  Filled 2022-11-28: qty 1

## 2022-11-28 MED ORDER — STERILE WATER FOR INJECTION IJ/IV SOLN (WRAP)
1.0000 g | INTRAMUSCULAR | Status: DC
Start: 2022-11-28 — End: 2022-12-02
  Administered 2022-11-28 – 2022-12-02 (×5): 1 g via INTRAVENOUS
  Filled 2022-11-28 (×5): qty 1000

## 2022-11-28 MED ORDER — SODIUM POLYSTYRENE SULFONATE 15 GM/60ML PO SUSP
15.0000 g | Freq: Once | ORAL | Status: AC
Start: 2022-11-28 — End: 2022-11-28
  Administered 2022-11-28: 15 g via ORAL
  Filled 2022-11-28: qty 60

## 2022-11-28 MED ORDER — BUDESONIDE-FORMOTEROL FUMARATE 160-4.5 MCG/ACT IN AERO
2.0000 | INHALATION_SPRAY | Freq: Two times a day (BID) | RESPIRATORY_TRACT | Status: DC
Start: 2022-11-28 — End: 2022-12-02
  Administered 2022-11-28 – 2022-12-02 (×9): 2 via RESPIRATORY_TRACT
  Filled 2022-11-28: qty 10.2
  Filled 2022-11-28: qty 6

## 2022-11-28 MED ORDER — ONDANSETRON HCL 4 MG/2ML IJ SOLN
4.0000 mg | INTRAMUSCULAR | Status: DC
Start: 2022-11-28 — End: 2022-11-28

## 2022-11-28 MED ORDER — FAMOTIDINE 20 MG PO TABS
20.0000 mg | ORAL_TABLET | Freq: Every day | ORAL | Status: DC
Start: 2022-11-28 — End: 2022-12-02
  Administered 2022-11-28 – 2022-12-02 (×5): 20 mg via ORAL
  Filled 2022-11-28 (×5): qty 1

## 2022-11-28 MED ORDER — HYDRALAZINE HCL 20 MG/ML IJ SOLN
5.0000 mg | Freq: Four times a day (QID) | INTRAMUSCULAR | Status: DC | PRN
Start: 2022-11-28 — End: 2022-12-02

## 2022-11-28 MED ORDER — CALCIUM GLUCONATE-NACL 1-0.675 GM/50ML-% IV SOLN
1.0000 g | INTRAVENOUS | Status: AC
Start: 2022-11-28 — End: 2022-11-28
  Administered 2022-11-28 (×2): 1 g via INTRAVENOUS
  Filled 2022-11-28 (×3): qty 50

## 2022-11-28 MED ORDER — FAMOTIDINE 20 MG PO TABS
20.0000 mg | ORAL_TABLET | Freq: Two times a day (BID) | ORAL | Status: DC
Start: 2022-11-28 — End: 2022-11-28

## 2022-11-28 MED ORDER — SODIUM BICARBONATE 650 MG PO TABS
1300.0000 mg | ORAL_TABLET | Freq: Three times a day (TID) | ORAL | Status: DC
Start: 2022-11-28 — End: 2022-12-02
  Administered 2022-11-28 – 2022-12-02 (×14): 1300 mg via ORAL
  Filled 2022-11-28 (×15): qty 2

## 2022-11-28 MED ORDER — PROCHLORPERAZINE EDISYLATE 10 MG/2ML IJ SOLN
5.0000 mg | Freq: Once | INTRAMUSCULAR | Status: AC
Start: 2022-11-28 — End: 2022-11-28
  Administered 2022-11-28: 5 mg via INTRAVENOUS
  Filled 2022-11-28: qty 2

## 2022-11-28 MED ORDER — ONDANSETRON 4 MG PO TBDP
4.0000 mg | ORAL_TABLET | ORAL | Status: DC | PRN
Start: 2022-11-28 — End: 2022-12-02

## 2022-11-28 MED ORDER — INSULIN GLARGINE 100 UNIT/ML SC SOLN
30.0000 [IU] | Freq: Two times a day (BID) | SUBCUTANEOUS | Status: DC
Start: 2022-11-28 — End: 2022-12-01
  Administered 2022-11-28 – 2022-11-30 (×5): 30 [IU] via SUBCUTANEOUS
  Filled 2022-11-28 (×5): qty 30

## 2022-11-28 MED ORDER — INSULIN LISPRO 100 UNIT/ML SOLN (WRAP)
5.0000 [IU] | Freq: Once | Status: AC
Start: 2022-11-28 — End: 2022-11-28
  Administered 2022-11-28: 5 [IU] via SUBCUTANEOUS
  Filled 2022-11-28: qty 15

## 2022-11-28 MED ORDER — TECHNETIUM TC 99M ALBUMIN AGGREGATED
5.0000 | Freq: Once | Status: AC | PRN
Start: 2022-11-28 — End: 2022-11-28
  Administered 2022-11-28: 5 via INTRAVENOUS

## 2022-11-28 MED ORDER — ACETAMINOPHEN 325 MG PO TABS
650.0000 mg | ORAL_TABLET | Freq: Four times a day (QID) | ORAL | Status: DC | PRN
Start: 2022-11-28 — End: 2022-12-02

## 2022-11-28 MED ORDER — ONDANSETRON HCL 4 MG/2ML IJ SOLN
4.0000 mg | INTRAMUSCULAR | Status: DC | PRN
Start: 2022-11-28 — End: 2022-12-02
  Administered 2022-11-28: 4 mg via INTRAVENOUS
  Filled 2022-11-28: qty 2

## 2022-11-28 MED ORDER — INSULIN LISPRO 100 UNIT/ML SOLN (WRAP)
1.0000 [IU] | Freq: Every evening | Status: DC
Start: 2022-11-28 — End: 2022-12-02
  Administered 2022-11-29: 2 [IU] via SUBCUTANEOUS
  Administered 2022-11-29 – 2022-12-01 (×3): 1 [IU] via SUBCUTANEOUS
  Filled 2022-11-28 (×2): qty 3
  Filled 2022-11-28: qty 6
  Filled 2022-11-28: qty 3

## 2022-11-28 MED ORDER — OXYCODONE HCL 5 MG PO TABS
10.0000 mg | ORAL_TABLET | Freq: Four times a day (QID) | ORAL | Status: DC | PRN
Start: 2022-11-28 — End: 2022-12-02
  Administered 2022-11-29 – 2022-12-01 (×3): 10 mg via ORAL
  Filled 2022-11-28 (×3): qty 2

## 2022-11-28 MED ORDER — ONDANSETRON 4 MG PO TBDP
4.0000 mg | ORAL_TABLET | ORAL | Status: DC
Start: 2022-11-28 — End: 2022-11-28

## 2022-11-28 MED ORDER — INSULIN LISPRO 100 UNIT/ML SOLN (WRAP)
1.0000 [IU] | Freq: Three times a day (TID) | Status: DC
Start: 2022-11-28 — End: 2022-12-02
  Administered 2022-11-28: 5 [IU] via SUBCUTANEOUS
  Administered 2022-11-29: 1 [IU] via SUBCUTANEOUS
  Filled 2022-11-28: qty 3
  Filled 2022-11-28: qty 15

## 2022-11-28 MED ORDER — INSULIN GLARGINE 100 UNIT/ML SC SOLN
53.0000 [IU] | Freq: Two times a day (BID) | SUBCUTANEOUS | Status: DC
Start: 2022-11-28 — End: 2022-11-28

## 2022-11-28 NOTE — ED Provider Notes (Signed)
History     Chief Complaint   Patient presents with    Shortness of Breath    Chest Pain    Back Pain     Patient with a history of congestive heart failure, chronic renal disease, diabetes, asthma, obesity, sleep apnea, presents to the emergency room today with increasing shortness of breath that has been worsening over the last several days.  She states that is been associated with a cough that was nonproductive.  Was associated with chest tightness as well.  Patient has a history of pulmonary embolism, however is fully anticoagulated with Eliquis.  She has chronic kidney issues and is being followed by her renal physician for this.  Patient states that associated with that she has some bilateral flank pain.  She states that her urine has been more dark as well.    The history is provided by the patient.   Shortness of Breath  Severity:  Moderate  Onset quality:  Gradual  Duration:  3 days  Timing:  Constant  Progression:  Worsening  Chronicity:  Recurrent  Context: URI    Associated symptoms: chest pain and cough    Associated symptoms: no abdominal pain, no fever, no headaches, no rash, no sore throat and no vomiting    Chest Pain  Associated symptoms: back pain, cough and shortness of breath    Associated symptoms: no abdominal pain, no dizziness, no fatigue, no fever, no headache, no nausea, no palpitations and no vomiting    Back Pain  Associated symptoms: chest pain    Associated symptoms: no abdominal pain, no dysuria, no fever and no headaches         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     Diabetes mellitus     Fibromyalgia     Gastroesophageal reflux disease     Hyperlipidemia     Hypertension     Neuropathy     Pulmonary embolism 08/2019    Renal insufficiency     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 kr    CESAREAN SECTION      x 2    CHOLECYSTECTOMY      EYE SURGERY Bilateral     cataract    HYSTERECTOMY  12/2019    RELEASE, CARPAL TUNNEL Left 07/10/2022    Procedure: RELEASE, CARPAL TUNNEL;  Surgeon: Cathrine Muster, MD;  Location: [facility];  Service: Plastics;  Laterality: Left;    REPAIR, UPPER EXTREMITY, TENDON & NERVE Right 12/05/2021    Procedure: REPAIR, RIGHT ULNAR NERVE AND NEUROPLASTY @ ELBOW AND WRIST, RIGHT OPEN CARPAL TUNNEL  RELEASE;  Surgeon: Cathrine Muster, MD;  Location: [facility];  Service: Plastics;  Laterality: Right;    REPAIR, UPPER EXTREMITY, TENDON & NERVE Left 07/10/2022    Procedure: LEFT ULNAR NERVE NEUROPLASTY AT ELBOW AND WRIST, LEFT OPEN CARPAL TUNNEL RELEASE;  Surgeon: Cathrine Muster, MD;  Location: [facility];  Service: Plastics;  Laterality: Left;    UTERINE RUPTURE         Family History   Problem Relation Age of Onset    Heart disease Mother     Hypertension Mother     Diabetes Mother     Asthma Father     Hypertension Father     Diabetes Daughter     Asthma Son     Cancer Maternal Aunt     Cancer Maternal Grandmother        Social  Social History     Tobacco Use    Smoking status: Never    Smokeless tobacco: Never   Vaping Use    Vaping Use: Never used   Substance Use Topics    Alcohol use: Never    Drug use: Never       .     Allergies   Allergen Reactions    Amlodipine Hives    Bactrim [Sulfamethoxazole-Trimethoprim] Hives, Respiratory Distress and Edema    Glipizide Edema     Throat swelling    Lisinopril Edema     Tongue swelling     Losartan Other (See Comments), Swelling and Edema       Home Medications       Med List Status: In Progress Set By: Margretta Ditty, RN at 11/27/2022  7:47 PM              albuterol sulfate HFA (PROVENTIL) 108 (90 Base) MCG/ACT inhaler     Inhale 2 puffs into the lungs every 4 (four) hours as needed for Wheezing     Alcohol Swabs Pads     Use as indicated for insulin injections     apixaban (ELIQUIS) 5 MG     Take 1 tablet (5 mg) by mouth every 12 (twelve) hours     ARIPiprazole (ABILIFY) 5 MG tablet     Take 2 tablets (10 mg) by mouth daily     aspirin 81 MG chewable tablet     Chew 1 tablet (81 mg) by mouth daily     atorvastatin (LIPITOR) 40 MG tablet     Take 2 tablets (80 mg) by mouth nightly     bumetanide (BUMEX) 2 MG tablet     Take 1 tablet (2 mg total) by mouth 2 (two) times daily     carvedilol (COREG) 25 MG tablet     Take 1 tablet (25 mg) by mouth 2 (two) times daily     diclofenac Sodium (VOLTAREN) 1 % Gel topical gel     Apply 2 g topically 4 (four) times daily     docusate sodium (COLACE) 100 MG capsule     Take 1 capsule (100 mg) by mouth nightly     DULoxetine (CYMBALTA) 20 MG capsule     Take 1 capsule (20 mg) by mouth daily     EPINEPHrine 0.15 MG/0.3ML injection     Inject 0.3 mLs (0.15 mg) into the muscle as needed     Fluticasone Propionate, Inhal, (Fluticasone Propionate Diskus) 50 MCG/ACT Aerosol Pwdr, Breath Activated     Inhale into the lungs  folic acid (FOLVITE) 1 MG tablet     Take 1 tablet (1 mg) by mouth daily     insulin detemir (Levemir) 100 UNIT/ML injection     Inject 50 Units into the skin every 12 (twelve) hours     Patient taking differently: Inject 53 Units into the skin every 12 (twelve) hours     insulin lispro (HumaLOG) 100 UNIT/ML injection     Inject 25 Units into the skin 3 (three) times daily before meals     Patient taking differently: Inject 25 Units into the skin 3 (three) times daily before meals     Insulin Syringe-Needle U-100 27G X 1/2" 1 ML Misc     Inject 1  Dose into the skin 4 (four) times daily     montelukast (SINGULAIR) 10 MG tablet     Take 1 tablet (10 mg) by mouth nightly     olmesartan (BENICAR) 40 MG tablet     Take 1 tablet (40 mg) by mouth daily     pregabalin (LYRICA) 150 MG capsule     Take 1 capsule (150 mg) by mouth 2 (two) times daily     semaglutide (WEGOVY) 1.7 MG/0.75ML injection     Inject 0.5 mLs (1 mg) into the skin once a week     Patient not taking: Reported on 07/02/2022     sertraline (ZOLOFT) 50 MG tablet     Take 1 tablet (50 mg) by mouth daily     sodium bicarbonate 650 MG tablet     Take 1 tablet (650 mg) by mouth 2 (two) times daily     traZODone (DESYREL) 50 MG tablet     Take 1 tablet (50 mg) by mouth nightly             Review of Systems   Constitutional: Negative.  Negative for chills, fatigue and fever.   HENT:  Negative for sinus pain and sore throat.    Eyes:  Negative for pain and redness.   Respiratory:  Positive for cough, chest tightness and shortness of breath.    Cardiovascular:  Positive for chest pain. Negative for palpitations.   Gastrointestinal:  Negative for abdominal pain, nausea and vomiting.   Endocrine: Negative for cold intolerance.   Genitourinary:  Positive for flank pain. Negative for dysuria and vaginal bleeding.   Musculoskeletal:  Positive for back pain. Negative for arthralgias.   Skin:  Negative for rash.   Allergic/Immunologic: Negative for environmental allergies.   Neurological:  Negative for dizziness and headaches.   Hematological:  Does not bruise/bleed easily.   Psychiatric/Behavioral:  Negative for dysphoric mood. The patient is not nervous/anxious.        PCP: Alyson Locket, DO    Physical Exam    BP: (!) 197/103, Heart Rate: 90, Temp: 98.6 F (37 C), Resp Rate: 22, SpO2: 99 %    Physical Exam  Vitals and nursing note reviewed.   Constitutional:       General: She is awake.      Appearance: Normal appearance. She is obese.   HENT:      Head: Normocephalic and atraumatic.   Eyes:      General:  Vision grossly intact.      Conjunctiva/sclera: Conjunctivae normal.   Neck:      Trachea: Trachea normal.   Cardiovascular:      Rate and Rhythm: Normal rate and regular rhythm.      Pulses:  Radial pulses are 2+ on the right side and 2+ on the left side.   Pulmonary:      Effort: Tachypnea and respiratory distress (Mild tachypnea) present.      Breath sounds: Decreased breath sounds and wheezing present.   Chest:      Chest wall: No deformity or tenderness.   Abdominal:      General: Abdomen is flat.      Palpations: Abdomen is soft.      Tenderness: There is no abdominal tenderness.   Musculoskeletal:      Cervical back: Normal range of motion and neck supple.      Right lower leg: No edema.      Left lower leg: No edema.   Skin:     General: Skin is warm.      Capillary Refill: Capillary refill takes less than 2 seconds.      Coloration: Skin is not jaundiced or pale.   Neurological:      General: No focal deficit present.      Mental Status: She is alert. Mental status is at baseline.      Motor: No weakness.      Gait: Gait is intact.   Psychiatric:         Mood and Affect: Affect normal. Mood is anxious.         Speech: Speech normal.           MDM and ED Course     ED Medication Orders (From admission, onward)      Start Ordered     Status Ordering Provider    11/28/22 0900 11/27/22 2325  ARIPiprazole (ABILIFY) tablet 10 mg  Daily        Route: Oral  Ordered Dose: 10 mg       Ordered GOARD, JODY R    11/28/22 0900 11/27/22 2325  aspirin chewable tablet 81 mg  Daily        Route: Oral  Ordered Dose: 81 mg       Ordered GOARD, JODY R    11/28/22 0900 11/27/22 2325  DULoxetine (CYMBALTA) DR capsule 20 mg  Daily        Route: Oral  Ordered Dose: 20 mg       Ordered GOARD, JODY R    11/28/22 0900 18/84/16 6063  folic acid (FOLVITE) tablet 1 mg  Daily        Route: Oral  Ordered Dose: 1 mg       Ordered GOARD, JODY R    11/28/22 0900 11/27/22 2325    Daily        Route: Oral  Ordered Dose: 320 mg        Discontinued GOARD, JODY R    11/28/22 0900 11/27/22 2325  sertraline (ZOLOFT) tablet 50 mg  Daily        Route: Oral  Ordered Dose: 50 mg       Ordered GOARD, JODY R    11/28/22 0000 11/27/22 2325  albuterol (PROVENTIL) (2.5 MG/3ML) 0.083% nebulizer solution 2.5 mg  RT - Every 4 hours scheduled        Route: Nebulization  Ordered Dose: 2.5 mg       Ordered GOARD, JODY R    11/27/22 2330 11/27/22 2330  morphine injection 1 mg  Every 4 hours PRN        Route: Intravenous  Ordered Dose: 1 mg       Ordered GOARD, JODY R  11/27/22 2329 11/27/22 2330  oxyCODONE (ROXICODONE) immediate release tablet 5 mg  Every 4 hours PRN        Route: Oral  Ordered Dose: 5 mg       Ordered GOARD, JODY R    11/27/22 2329 11/27/22 2330  ondansetron (ZOFRAN-ODT) disintegrating tablet 4 mg  Every 6 hours PRN        Route: Oral  Ordered Dose: 4 mg      See Hyperspace for full Linked Orders Report.    Ordered GOARD, JODY R    11/27/22 2329 11/27/22 2330  ondansetron (ZOFRAN) injection 4 mg  Every 6 hours PRN        Route: Intravenous  Ordered Dose: 4 mg      See Hyperspace for full Linked Orders Report.    Ordered GOARD, JODY R    11/27/22 2329 11/27/22 2330  polyethylene glycol (MIRALAX) packet 17 g  Daily PRN        Route: Oral  Ordered Dose: 17 g       Ordered GOARD, JODY R    11/27/22 2329 11/27/22 2330  acetaminophen (TYLENOL) tablet 650 mg  3 times daily PRN        Route: Oral  Ordered Dose: 650 mg       Ordered GOARD, JODY R    11/27/22 2327 11/27/22 2325  carvedilol (COREG) tablet 25 mg  Every 12 hours cardiac        Route: Oral  Ordered Dose: 25 mg       Ordered GOARD, JODY R    11/27/22 2327 11/27/22 2326  apixaban (ELIQUIS) tablet 5 mg  Every 12 hours scheduled        Route: Oral  Ordered Dose: 5 mg       Ordered GOARD, JODY R    11/27/22 2326 11/27/22 2330  dextrose (GLUCOSE) 40 % oral gel 15 g of glucose  As needed        Route: Oral  Ordered Dose: 15 g of glucose      See Hyperspace for full Linked Orders Report.    Ordered  GOARD, JODY R    11/27/22 2326 11/27/22 2330  dextrose (D10W) 10% bolus 125 mL  As needed        Route: Intravenous  Ordered Dose: 12.5 g      See Hyperspace for full Linked Orders Report.    Ordered Leigh Aurora, JODY R    11/27/22 2326 11/27/22 2330  dextrose 50 % bolus 12.5 g  As needed        Route: Intravenous  Ordered Dose: 12.5 g      See Hyperspace for full Linked Orders Report.    Ordered GOARD, JODY R    11/27/22 2326 11/27/22 2330  glucagon (rDNA) (GLUCAGEN) injection 1 mg  As needed        Route: Intramuscular  Ordered Dose: 1 mg      See Hyperspace for full Linked Orders Report.    Ordered Leigh Aurora, JODY R    11/27/22 2326 11/27/22 2330  melatonin tablet 3 mg  At bedtime PRN        Route: Oral  Ordered Dose: 3 mg       Ordered GOARD, JODY R    11/27/22 2326 11/27/22 2330  saline (OCEAN NASAL SPRAY) 0.65 % nasal solution 2 spray  Every 4 hours PRN        Route: Each Nare  Ordered Dose: 2 spray  Ordered Aura Fey, JODY R    11/27/22 2326 11/27/22 2330  carboxymethylcellulose (REFRESH TEARS) 0.5 % ophthalmic solution 1 drop  3 times daily PRN        Route: Both Eyes  Ordered Dose: 1 drop       Ordered GOARD, JODY R    11/27/22 2326 11/27/22 2330  benzocaine-menthol (CEPACOL/CHLORASEPTIC) lozenge 1 lozenge  Every 2 hours PRN        Route: Buccal  Ordered Dose: 1 lozenge       Ordered GOARD, JODY R    11/27/22 2326 11/27/22 2330  benzonatate (TESSALON) capsule 100 mg  3 times daily PRN        Route: Oral  Ordered Dose: 100 mg       Ordered GOARD, JODY R    11/27/22 2326 11/27/22 2330    As needed        Route: Intravenous  Ordered Dose: 1 g       Discontinued GOARD, JODY R    11/27/22 2326 11/27/22 2330    As needed        Route: Oral  Ordered Dose: 0-40 mEq      See Hyperspace for full Linked Orders Report.    Discontinued Candice Camp    11/27/22 2326 11/27/22 2330    As needed        Route: Intravenous  Ordered Dose: 10 mEq      See Hyperspace for full Linked Orders Report.    Discontinued Candice Camp     11/27/22 2326 11/27/22 2330    As needed        Route: Oral  Ordered Dose: 2 packet       Discontinued GOARD, JODY R    11/27/22 2326 11/27/22 2330  naloxone (NARCAN) injection 0.2 mg  As needed        Route: Intravenous  Ordered Dose: 0.2 mg       Ordered GOARD, JODY R    11/27/22 2326 11/27/22 2325  atorvastatin (LIPITOR) tablet 80 mg  At bedtime        Route: Oral  Ordered Dose: 80 mg       Ordered GOARD, JODY R    11/27/22 2326 11/27/22 2325  bumetanide (BUMEX) tablet 2 mg  2 times daily        Route: Oral  Ordered Dose: 2 mg       Ordered GOARD, JODY R    11/27/22 2326 11/27/22 2325  docusate sodium (COLACE) capsule 100 mg  At bedtime        Route: Oral  Ordered Dose: 100 mg       Ordered GOARD, JODY R    11/27/22 2326 11/27/22 2325  montelukast (SINGULAIR) tablet 10 mg  At bedtime        Route: Oral  Ordered Dose: 10 mg       Ordered GOARD, JODY R    11/27/22 2326 11/27/22 2325  pregabalin (LYRICA) capsule 150 mg  2 times daily        Route: Oral  Ordered Dose: 150 mg       Ordered GOARD, JODY R    11/27/22 2326 11/27/22 2325  sodium bicarbonate tablet 650 mg  2 times daily        Route: Oral  Ordered Dose: 650 mg       Ordered GOARD, JODY R    11/27/22 2326 11/27/22 2325  traZODone (DESYREL) tablet 50 mg  At bedtime  Route: Oral  Ordered Dose: 50 mg       Ordered GOARD, JODY R    11/27/22 2314 11/27/22 2313  oxyCODONE (ROXICODONE) immediate release tablet 5 mg  Once        Route: Oral  Ordered Dose: 5 mg       Last MAR action: Given Batu Cassin    11/27/22 2222 11/27/22 2221  furosemide (LASIX) injection 40 mg  Once        Route: Intravenous  Ordered Dose: 40 mg       Last MAR action: Given Shabria Egley    11/27/22 2126 11/27/22 2125  acetaminophen (TYLENOL) tablet 1,000 mg  Once        Route: Oral  Ordered Dose: 1,000 mg       Last MAR action: Given Jeremy Mclamb    11/27/22 2039 11/27/22 2038  hydrALAZINE (APRESOLINE) injection 10 mg  Once        Route: Intravenous  Ordered Dose: 10 mg        Last MAR action: Given Esme Durkin    11/27/22 1956 11/27/22 1955  ketorolac (TORADOL) injection 15 mg  Once        Route: Intravenous  Ordered Dose: 15 mg       Last MAR action: Given Carolyn Maniscalco    11/27/22 1953 11/27/22 1952  albuterol-ipratropium (DUO-NEB) 2.5-0.5(3) mg/3 mL nebulizer 3 mL  RT - Once        Route: Nebulization  Ordered Dose: 3 mL       Last MAR action: Given Morgan Rennert    11/27/22 1953 11/27/22 1952  albuterol (PROVENTIL) (2.5 MG/3ML) 0.083% nebulizer solution 2.5 mg  RT - Once        Route: Nebulization  Ordered Dose: 2.5 mg       Last MAR action: Given Bari Leib               Medical Decision Making  Patient presents to the emergency room with gradual onset of increasing shortness of breath associated with cough in the setting of known renal disease, congestive heart failure, asthma.  Differential is broad and includes pulmonary embolism, ACS, asthma, influenza, COPD, COVID, pulmonary edema.  EKG was performed and this demonstrates no acute ST-T wave changes.  Troponin is negative.  BNP is moderately elevated at 1400.  COVID and flu testing is negative.  Chest x-ray shows no evidence of focal infiltrates.  Patient does have an elevated WBC of 15.  She is chronically anemic and this is at baseline.  Creatinine is at baseline as well at 2.1.  Patient given neb treatments back to back here and with this, symptoms that improved somewhat.  She is also significantly hypertensive here and so was given IV hydralazine to assist with this.  With these interventions, patient symptoms improved and on repeat evaluation at 2200 hrs., patient feels much improved and repeat lung exam demonstrates clear lung fields and decreased respiratory rate.  At this point, I do feel patient likely needs admission to the hospital for further blood pressure control, observation, possibly further neb treatments.  Discussed with the hospitalist.    This chart was generated in a busy and noisy emergency  room using voice recognition software.  All charts are carefully checked and monitored for transcription errors, however due to the nature of this practice environment errors may occasionally occur.    CRITICAL CARE: The high probability of sudden, clinically significant deterioration in the patient's condition required the  highest level of my preparedness to intervene urgently.    The services I provided to this patient were to treat and/or prevent clinically significant deterioration that could result in: death, respiratory distress.  Services included the following: chart data review, reviewing nursing notes and/or old charts, documentation time, consultant collaboration regarding findings and treatment options, medication orders and management, direct patient care, re-evaluations, vital sign assessments and ordering, interpreting and reviewing diagnostic studies/lab tests.    Aggregate critical care time was 40 minutes, which includes only time during which I was engaged in work directly related to the patient's care, as described above, whether at the bedside or elsewhere in the Emergency Department.  It did not include time spent performing other reported procedures or the services of residents, students, nurses or physician assistants.    Primary Admitting Service:    I have discussed this case with Dr. Holland Falling at 2251 (time), who accepts patient for admission and requests Observation IMCU bed.         Amount and/or Complexity of Data Reviewed  Labs: ordered. Decision-making details documented in ED Course.  Radiology: ordered and independent interpretation performed. Decision-making details documented in ED Course.  ECG/medicine tests: ordered and independent interpretation performed. Decision-making details documented in ED Course.    Risk  OTC drugs.  Prescription drug management.          ED Course as of 11/28/22 0016   Thu Nov 27, 2022   2037 hs Troponin-I: 8.9 [MC]   2037 SARS CoV 2 Overall Result:  Not Detected [MC]   2037 Influenza A: Not Detected [MC]   2037 Influenza B: Not Detected [MC]   2037 WBC(!): 15.48 [MC]   2037 Hematocrit(!): 31.4 [MC]   2037 Creatinine(!): 2.1 [MC]   2037 Chest 2 Views  No acute disease.  I independently reviewed the x-ray and this interpretation was confirmed by radiology. [MC]   2125 SARS CoV 2 Overall Result: Not Detected [MC]   2125 Influenza A: Not Detected [MC]   2125 Influenza B: Not Detected [MC]   2220 NT-proBNP(!): 1,368 [MC]   Fri Nov 28, 2022   0016 ECG 12 Lead  ECG demonstrates normal sinus rhythm, no acute ST/T wave changes.  Unchanged from previous.  No evidence of ischemia.  Independently interpreted by myself. [MC]      ED Course User Index  [MC] Lorrin Jackson, MD             I am the first provider for this patient.    I reviewed the patient's past medical history, surgical history, medication list, and previous relevant ED visits and admissions.    I reviewed the patient's vital signs during their visit.  Patient Vitals for the past 12 hrs:   BP Temp Pulse Resp   11/27/22 2345 163/67 -- 79 19   11/27/22 2315 178/79 -- 81 17   11/27/22 2245 182/89 -- 85 16   11/27/22 2216 193/84 -- 85 16   11/27/22 2145 184/88 -- 83 16   11/27/22 2117 197/82 -- 85 17   11/27/22 2100 (!) 209/90 -- 86 14   11/27/22 2021 (!) 213/95 -- 77 --   11/27/22 1955 -- 98.7 F (37.1 C) 81 15   11/27/22 1907 (!) 197/103 98.6 F (37 C) 90 22       Clinical Impression & Disposition     Clinical Impression  Final diagnoses:   Asthma exacerbation attacks        ED Disposition  ED Disposition   Observation    Condition   --    Date/Time   Thu Nov 27, 2022 10:51 PM    Comment   Admitting Physician: Georgiana Shore [[id number]]   Service:: Medicine [[id number]]   Estimated Length of Stay: < 2 midnights   Tentative Discharge Plan?: Home or Self Care [1]   Does patient need telemetry?: Yes   Is patient 18 yrs or greater?: Yes   Telemetry type (separate Telemetry order is also required):: Adult telemetry                   New Prescriptions    No medications on file           Lorrin Jackson, MD  11/28/22 [phone]

## 2022-11-28 NOTE — Plan of Care (Signed)
NURSING SHIFT NOTE     Patient: [name]  Day: 0      SHIFT EVENTS   Patient remained free from falls and injury throughout shift.  Safety and fall precautions remain in place.   Purposeful rounding completed.        ASSESSMENT     Changes in assessment from patient's baseline this shift:    Neuro: No  CV: No  Pulm: No  Peripheral Vascular: No  HEENT: No  GI: No  BM during shift: No, Last BM: Last BM Date: 11/26/22  GU: No   Integ: No  MS: No    Pain: Improved  Pain Interventions: Medications, Rest, and Positioning  Medications Utilized: N/A    Mobility: PMP Activity: Step 6 - Walks in Room of Distance Walked (ft) (Step 6,7): 30 Feet           Lines     Patient Lines/Drains/Airways Status       Active Lines, Drains and Airways       Name Placement date Placement time Site Days    Peripheral IV 11/27/22 Right Antecubital 11/27/22  1955  Antecubital  less than 1    Peripheral IV 11/28/22 22 G Standard Anterior;Right Forearm 11/28/22  1701  Forearm  less than 1                         VITAL SIGNS     Vitals:    11/28/22 1609   BP: 167/81   Pulse: 66   Resp: 18   Temp: 98 F (36.7 C)   SpO2: 95%       Temp  Min: 97.5 F (36.4 C)  Max: 98.7 F (37.1 C)  Pulse  Min: 66  Max: 90  Resp  Min: 14  Max: 22  BP  Min: 104/59  Max: 213/95  SpO2  Min: 90 %  Max: 100 %      Intake/Output Summary (Last 24 hours) at 11/28/2022 1827  Last data filed at 11/28/2022 1749  Gross per 24 hour   Intake 1350 ml   Output 300 ml   Net 1050 ml          CARE PLAN       Problem: Moderate/High Fall Risk Score >5  Goal: Patient will remain free of falls  Outcome: Progressing  Flowsheets  Taken 11/28/2022 0800 by Tyna Huertas, RN  Moderate Risk (6-13):   MOD-Consider activation of bed alarm if appropriate   MOD-Apply bed exit alarm if patient is confused   MOD-Floor mat at bedside (where available) if appropriate   MOD-Consider a move closer to [place]   MOD-Remain with patient during toileting   MOD-Place bedside commode and  assistive devices out of sight when not in use   MOD-Re-orient confused patients   MOD-Utilize diversion activities   MOD-Perform dangle, stand, walk (DSW) prior to mobilization   MOD-Request PT/OT consult order for patients with gait/mobility impairment   MOD-Use gait belt when appropriate  Taken 11/28/2022 0311 by Christella Hartigan, RN  VH Moderate Risk (6-13):   ALL REQUIRED LOW INTERVENTIONS   INITIATE YELLOW "FALL RISK" SIGNAGE   YELLOW NON-SKID SLIPPERS   Remain with patient during toileting  VH High Risk (Greater than 13):   ALL REQUIRED LOW INTERVENTIONS   ALL REQUIRED MODERATE INTERVENTIONS     Problem: Hemodynamic Status: Cardiac  Goal: Stable vital signs and fluid balance  Outcome: Progressing  Flowsheets (Taken  11/28/2022 0311 by Christella Hartigan, RN)  Stable vital signs and fluid balance:   Assess signs and symptoms associated with cardiac rhythm changes   Monitor lab values     Problem: Inadequate Tissue Perfusion  Goal: Adequate tissue perfusion will be maintained  Outcome: Progressing  Flowsheets (Taken 11/28/2022 0311 by Christella Hartigan, RN)  Adequate tissue perfusion will be maintained:   Monitor/assess lab values and report abnormal values   Monitor/assess for signs of VTE (edema of calf/thigh redness, pain)   Encourage/assist patient as needed to turn, cough, and perform deep breathing every 2 hours   Monitor for signs and symptoms of a pulmonary embolism (dyspnea, tachypnea, tachycardia, confusion)   Monitor/assess neurovascular status (pulses, capillary refill, pain, paresthesia, paralysis, presence of edema)     Problem: Ineffective Gas Exchange  Goal: Effective breathing pattern  Outcome: Progressing  Flowsheets (Taken 11/28/2022 0311 by Christella Hartigan, RN)  Effective breathing pattern:   Maintain CO2 level per LIP order   Teach/reinforce use of ordered respiratory interventions (ie. CPAP, BiPAP, Incentive Spirometer, Acapella)   Monitor end tidal CO2 level per LIP order     Problem: Fluid and  Electrolyte Imbalance/ Endocrine  Goal: Fluid and electrolyte balance are achieved/maintained  Outcome: Progressing  Flowsheets (Taken 11/28/2022 0311 by Christella Hartigan, RN)  Fluid and electrolyte balance are achieved/maintained:   Monitor/assess lab values and report abnormal values   Assess and reassess fluid and electrolyte status   Observe for cardiac arrhythmias   Monitor for muscle weakness     Problem: Diabetes: Glucose Imbalance  Goal: Blood glucose stable at established goal  Outcome: Progressing  Flowsheets (Taken 11/28/2022 0311 by Christella Hartigan, RN)  Blood glucose stable at established goal:   Monitor lab values   Include patient/family in decisions related to nutrition/dietary selections   Coordinate medication administration with meals, as indicated   Ensure appropriate consults are obtained (Nutrition, Diabetes Education, and Case Management)   Assess for hypoglycemia /hyperglycemia   Monitor intake and output.  Notify LIP if urine output is < 30 mL/hour.   Ensure appropriate diet and assess tolerance   Ensure adequate hydration   Monitor/assess vital signs   Follow fluid restrictions/IV/PO parameters   Ensure patient/family has adequate teaching materials     Problem: Inadequate Gas Exchange  Goal: Adequate oxygenation and improved ventilation  Outcome: Progressing  Flowsheets (Taken 11/28/2022 0311 by Christella Hartigan, RN)  Adequate oxygenation and improved ventilation:   Assess lung sounds   Plan activities to conserve energy: plan rest periods   Monitor SpO2 and treat as needed   Position for maximum ventilatory efficiency   Teach/reinforce use of incentive spirometer 10 times per hour while awake, cough and deep breath as needed   Increase activity as tolerated/progressive mobility   Provide mechanical and oxygen support to facilitate gas exchange   Consult/collaborate with Respiratory Therapy   Monitor and treat ETCO2  Goal: Patent Airway maintained  Outcome: Progressing  Flowsheets (Taken  11/28/2022 1017)  Patent airway maintained:   Position patient for maximum ventilatory efficiency   Provide adequate fluid intake to liquefy secretions   Suction secretions as needed   Reinforce use of ordered respiratory interventions (i.e. CPAP, BiPAP, Incentive Spirometer, Acapella, etc.)   Reposition patient every 2 hours and as needed unless able to self-reposition     Problem: Safety  Goal: Patient will be free from injury during hospitalization  Outcome: Progressing  Flowsheets (Taken 11/28/2022 0311 by Christella Hartigan, RN)  Patient will be free  from injury during hospitalization:   Assess patient's risk for falls and implement fall prevention plan of care per policy   Ensure appropriate safety devices are available at the bedside   Assess for patients risk for elopement and implement Elopement Risk Plan per policy   Provide and maintain safe environment   Include patient/ family/ care giver in decisions related to safety   Provide alternative method of communication if needed (communication boards, writing)   Use appropriate transfer methods   Hourly rounding  Goal: Patient will be free from infection during hospitalization  Outcome: Progressing  Flowsheets (Taken 11/28/2022 1017)  Free from Infection during hospitalization:   Assess and monitor for signs and symptoms of infection   Monitor lab/diagnostic results   Monitor all insertion sites (i.e. indwelling lines, tubes, urinary catheters, and drains)   Encourage patient and family to use good hand hygiene technique     Problem: Side Effects from Pain Analgesia  Goal: Patient will experience minimal side effects of analgesic therapy  Outcome: Progressing  Flowsheets (Taken 11/28/2022 1017)  Patient will experience minimal side effects of analgesic therapy:   Monitor/assess patient's respiratory status (RR depth, effort, breath sounds)   Assess for changes in cognitive function   Prevent/manage side effects per LIP orders (i.e. nausea, vomiting, pruritus,  constipation, urinary retention, etc.)   Evaluate for opioid-induced sedation with appropriate assessment tool (i.e. POSS)     Problem: Compromised Activity/Mobility  Goal: Activity/Mobility Interventions  Outcome: Progressing  Flowsheets (Taken 11/28/2022 0800)  Activity/Mobility Interventions: Pad bony prominences, TAP Seated positioning system when OOB, Promote PMP, Reposition q 2 hrs / turn clock, Offload heels (boots or pillows)

## 2022-11-28 NOTE — Progress Notes (Signed)
11/28/22 1128   Case Management Quick Doc   Acknowledgment of Outpatient/Observation Observation letter given     Cleaster Corin, CMS

## 2022-11-28 NOTE — Consults (Addendum)
[facility]  Office phone:  [phone]     [facility] phone [phone]  [facility] phone [phone]    Date:  11/28/2022  10:13 AM    Patient:  [name].  DOB: June 01, 1981.  42 y.o.  female , MRN [id number]   Attending:  Lisa Roca, MD   Admission date:  11/27/2022   ________________________________  Cardiology Attending: I performed the substantive portion of this visit by personally conducting the Medical Decision Making component, in its entirety. Patient was also seen by APP Shirley Friar for the history and/or  examination portion of the visit. I reviewed and updated the documented findings and plan of care accordingly as noted.     42 y.o. female with a history of chronic HFpEF, hypertension, dyslipidemia, CKD, DVT (initially in 2021, recurrence when briefly off of anticoagulation for a surgery in early 2023), asthma, sleep apnea but does not use CPAP, vertigo who presents to the hospital with coughing and shortness of breath as well as chest discomfort.    Notably, patient has been off of her medications for at least several days.  In the setting she developed the above symptoms.  Her blood pressure was markedly elevated at the time of her initial evaluation with a systolic blood pressure of 200 mmHg.  Her high-sensitivity troponin is normal EKG without acute ischemic appearing changes.  Overall patient is somewhat limited in her physical activity levels but is able to perform all of her ADLs, walk up a flight of steps, walk at least a few blocks without chest pain.    BP 130/82   Pulse 76   Temp 97.9 F (36.6 C) (Oral)   Resp 19   Wt 137.4 kg (302 lb 14.6 oz)   SpO2 98%   BMI 48.89 kg/m     No distress  Morbidly obese  Rrr, S1, S2, no sign murmurs  Abd soft and nt  No le edema  Neuro intact    EKG NSR, no ischemic changes    Impression:  Atypical CP, most likely pulm, vs. Uncontrolled HTN  related CP (not taking her medications) without ischemic EKG findings or elevation in hsTn -- all reassuring  -- control BP, treat medically at this time, ASA, statin, BP control  -- primary team planning to rule out PE    2. HTN, much improved back on meds    3. Uncontrolled DM history    4. Morbid obesity    5. History of PE    6. CKD with Cr of 2.5; has been as high as 3.2    7. Hyperkalemia -- 5.4; her ARB is on hold    8.  HFpEF -- appears well compensated; CXR clear, lungs clear on exam    9. Anemia, Hgb chronically 8-9, unclear etiology, ?CKD    Will follow to see how things evolve      ASSESSMENT & PLAN:   42 y.o. female with a history of chronic HFpEF, hypertension, dyslipidemia, CKD, DVT (initially in 2021, recurrence when briefly off of anticoagulation for a surgery in early 2023), asthma, sleep apnea but does not use CPAP, vertigo who presents to the hospital with coughing and shortness of breath as well as chest discomfort.      #Chest discomfort-patient presents with a few days of coughing, also having some exertional chest tightness which will last for 5 to 10 minutes, relieved with rest.  She states that she has had similar symptoms chronically, but has been more notable in recent days.  History of nuclear medicine stress test more than a year ago with her cardiologist at [facility] which she says was normal.  No recent ischemic evaluation.  High-sensitivity troponin on presentation normal x 2.  ECG without acute ischemic appearing changes.    The patient does have a history of asthma, as well as HFpEF, possible that her presenting chest discomfort is related to those issues.  Cannot rule out angina but reassuring that EKg and hs Tn are normal.    -Patient already ate breakfast today, however could consider nuclear medicine stress testing as outpt.    -Will defer evaluation of possible PE to primary team, she states she has similar symptoms in the past when she had pulmonary embolism.  That said she is on  chronic Eliquis (missed some doses last week), and had a recent lower extremity Doppler with no evidence of deep DVT on the left.    #Shortness of breath-possibly multifactorial with history of asthma, HFpEF.  Both of those are being managed as noted, has received diuretic.  Lungs sound fairly clear today.  As above, consideration for PE however patient is on chronic Eliquis with no missed doses.    #Hypertensive urgency-presented with uncontrolled blood pressure, in the 200/100 range.  She states intermittently it is high at home but typically well-controlled.  Blood pressures today are normal to low normal back on Coreg, and with diuresis.  Outpatient ARB is on hold.    -Continue to monitor blood pressures    #Acute on chronic HFpEF-history of normal LV systolic function by echocardiogram in early 2023 with no significant valvular dysfunction.  On chronic diuretic at home, followed by cardiologist at [facility], for history of HFpEF.  Typically has shortness of breath with moderate activity, has been more short of breath with light activity over the past few days.  Has noticed some lower leg edema.  Presenting NT proBNP 1268.  Chest x-ray was fairly unremarkable.  Was felt to be volume overloaded in the ED, received Lasix 40 mg IV x 1 and continued on Bumex 2 mg p.o. twice daily.  No significant I's and O's charted.  She feels like her legs are less edematous today.  Currently breathing comfortably.  No significant JVD and lungs are clear.    -May have presented with some volume overload but today fairly euvolemic.  Will continue with chronic Bumex 2 mg twice daily.    #CKD-presenting creatinine 2.1.  Creatinine today 2.5.  For comparison creatinine was as high as 3.09 January 2022.    #History of recurrent DVT-history of DVT in 2021 possibly with PE at that time as well, and DVT recurrence in early 2023 when briefly off of anticoagulation for a hand surgery.  Continues on Eliquis 5 mg twice daily as an outpatient with some  missed doses last week.  Presented to the ED recently with some leg discomfort after her missed Eliquis doses.  Left lower extremity Doppler 11/20/2022 showed no DVT.    -Continue chronic anticoagulation         Communicated with primary team.     HISTORY OF PRESENT ILLNESS:   42 y.o. female with a history of chronic HFpEF, hypertension, dyslipidemia, CKD, DVT (initially in 2021, recurrence when briefly off of anticoagulation for a surgery in early 2023), asthma, sleep apnea but does not use CPAP, vertigo who presents to the hospital with coughing and shortness  of breath as well as chest discomfort.    Regarding cardiac history, she follows with a cardiologist near [facility].  She thinks she saw him once this past year for management of her HFpEF and hypertension.  She says she has not had a stress test for more than a year.    She was seen by our service at [facility] with acute on chronic HFpEF in March 2023 when she also had a recurrence of DVT when she had been briefly off of anticoagulation for hand surgery.    She presents now with several different complaints.    She states that 1 week ago she began coughing with clear sputum production.  She states this happens sometimes when "she is getting into kidney failure".    Then over the past 2 to 3 days she has been having problems with shortness of breath and chest tightness.  She has been feeling short of breath with light activity, just normal chores around her house.  Denies new orthopnea but she sleeps inclined up on a chronic basis.  Denies PND.  Says she had a bit of increased lower leg edema over the past week or 2 .    She also describes chest heaviness.  Moderate intensity.  This will last for 5 or 10 minutes.  Typically only occurs when she is exerting herself and feeling some shortness of breath.  Does not think it is positional or related to respirations.  Has some radiation into her left arm at times.  She says this is not a  particular new issue but she has noticed it more over the past couple days.  In a normal week she will notice this a few times, it resolves with rest.    She presented to the ED yesterday with this constellation of symptoms.    Regarding baseline symptoms, she typically has shortness of breath with moderate activity.  No recent illnesses.  No recent changes in medications.  She states she has been compliant with her medications, aside from a few missed Eliquis doses early Jan.    She checks her blood pressures at home and typically they are well-controlled with systolic in the 130 to 140 mmHg range.      ED / Hospital Course:    Patient presented hypertensive with blood pressure 197/103.    Normal oxygen saturation on room air on presentation.    Presenting labs reviewed.  CBC notable for leukocytosis.  BUN 32, creatinine 2.1.  Creatinine today 2.5.  For comparison creatinine was as high as 3.09 January 2022.    High-sensitivity troponin 8.9 followed by 10.7.  LFTs normal.  NT proBNP 1268.    Chest x-ray in the ED read as normal.    Blood pressure today in the 100s/50s-70s.     Currently seated upright eating breakfast.  Denies any current chest pain.  Feels comfortable at rest.    She feels like her leg edema is much improved after Lasix in the ED.    ---------------------------------------------------------  Outpatient cardiovascular medications: Aspirin, Eliquis 5 mg twice daily, Lipitor 40 mg daily, Bumex 2 mg twice daily, Coreg 25 mg twice daily, olmesartan 40 mg daily    Reported no current tobacco/alcohol/recreational drug use.    Reported no premature CAD in the family.    MEDICATIONS:    INFUSION MEDS:      SCHEDULED MEDS:     Current Facility-Administered Medications   Medication Dose Route Frequency  apixaban  5 mg Oral Q12H SCH    ARIPiprazole  10 mg Oral Daily    aspirin  81 mg Oral Daily    atorvastatin  80 mg Oral QHS    budesonide-formoterol  2 puff Inhalation BID    bumetanide  2 mg Oral BID     calcium GLUConate  1 g Intravenous Q1H    carvedilol  25 mg Oral Q12H SCH    cefTRIAXone  1 g Intravenous Q24H    docusate sodium  100 mg Oral QHS    DULoxetine  20 mg Oral Daily    folic acid  1 mg Oral Daily    insulin glargine  30 Units Subcutaneous Q12H    insulin lispro  1-4 Units Subcutaneous QHS    insulin lispro  1-8 Units Subcutaneous TID AC    montelukast  10 mg Oral QHS    pregabalin  150 mg Oral BID    sertraline  50 mg Oral Daily    sodium bicarbonate  1,300 mg Oral TID    traZODone  50 mg Oral QHS      PRN MEDS:   acetaminophen, albuterol, benzocaine-menthol, benzonatate, artificial tears (REFRESH PLUS), dextrose **OR** dextrose **OR** dextrose **OR** glucagon (rDNA), hydrALAZINE, melatonin, morphine, naloxone, ondansetron **OR** ondansetron, oxyCODONE, polyethylene glycol, saline    ALLERGIES:     Allergies   Allergen Reactions    Amlodipine Hives    Bactrim [Sulfamethoxazole-Trimethoprim] Hives, Respiratory Distress and Edema    Glipizide Edema     Throat swelling    Lisinopril Edema     Tongue swelling    Losartan Other (See Comments), Swelling and Edema        DIAGNOSTICS:     Telemetry:  (I independently reviewed the telemetry data) sinus rhythm with rate in the 60s to 80s.    Reports reviewed:    ECG on presentation:  (I independently reviewed the ECG tracing) sinus rhythm, 89 bpm, nonspecific ST-T wave abnormality      TTE 01/2022:  Summary    * The left ventricle is normal in size.    * There is mild concentric left ventricular hypertrophy.    * Left ventricular systolic function is normal with an ejection fraction by  Biplane Method of Discs of  65 %.    * Left ventricular segmental wall motion is grossly normal, but subtle wall  motion abnormalities cannot be excluded as technically limited study.    * Left ventricular diastolic filling parameters are consistent with Grade I  diastolic dysfunction (impaired relaxation pattern).    * No significant valvular dysfunction.    * Compared to the  prior study, there has been no significant change    LABS:   Labs reviewed:  Recent Labs   Lab 11/28/22  0454 11/27/22  1956   WBC 12.49* 15.48*   Hgb 8.2* 9.1*   Hematocrit 28.6* 31.4*   Platelets 292 383*     Recent Labs   Lab 11/28/22  0454 11/27/22  1956   Sodium 132* 136   Potassium 5.5* 5.5*   Chloride 113* 113*   CO2 16* 17   BUN 35.0* 32.0*   Creatinine 2.5* 2.1*   eGFR 23.8* 29.4*   Glucose 349* 271*   Calcium 7.4* 7.9*     Recent Labs   Lab 11/27/22  2152 11/27/22  1956   hs Troponin-I 10.7 8.9   hs Troponin-I Delta 1.8  --      Recent Labs  Lab 11/27/22  1956   NT-proBNP 1,368*        PAST MEDICAL HISTORY:     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     Diabetes mellitus     Fibromyalgia     Gastroesophageal reflux disease     Hyperlipidemia     Hypertension     Neuropathy     Pulmonary embolism 08/2019    Renal insufficiency     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 kr    CESAREAN SECTION      x 2    CHOLECYSTECTOMY      EYE SURGERY Bilateral     cataract    HYSTERECTOMY  12/2019    RELEASE, CARPAL TUNNEL Left 07/10/2022    Procedure: RELEASE, CARPAL TUNNEL;  Surgeon: Romilda Joy, MD;  Location: [facility];  Service: Plastics;  Laterality: Left;    REPAIR, UPPER EXTREMITY, TENDON & NERVE Right 12/05/2021    Procedure: REPAIR, RIGHT ULNAR NERVE AND NEUROPLASTY @ ELBOW AND WRIST, RIGHT OPEN  CARPAL TUNNEL RELEASE;  Surgeon: Romilda Joy, MD;  Location: [facility];  Service: Plastics;  Laterality: Right;    REPAIR, UPPER EXTREMITY, TENDON & NERVE Left 07/10/2022    Procedure: LEFT ULNAR NERVE NEUROPLASTY AT ELBOW AND WRIST, LEFT OPEN CARPAL TUNNEL RELEASE;  Surgeon: Romilda Joy, MD;  Location: [facility];  Service: Plastics;  Laterality: Left;    UTERINE RUPTURE         SOCIAL HISTORY:     Social History     Tobacco Use    Smoking status: Never    Smokeless tobacco: Never   Substance Use Topics    Alcohol use: Never       FAMILY HISTORY:   family history includes Asthma in her father and son; Cancer in her maternal aunt and maternal grandmother; Diabetes in her daughter and mother; Heart disease in her mother; Hypertension in her father and mother.    Please see HPI above    FLUID BALANCE AND WEIGHT:                                           Last filed weight: Weight: 137.4 kg (302 lb 14.6 oz)  Weight on Admission: Weight: 137.4 kg (302 lb 14.6 oz)  Net IO Since Admission: 200 mL [11/28/22 1013]     Intake/Output Summary (Last 24 hours) at 11/28/2022 0700  Last data filed at 11/28/2022 0300  Gross per 24  hour   Intake 200 ml   Output --   Net 200 ml   Weight change:     Last 3 Weights for the past 72 hrs (Last 3 readings):   Weight   11/28/22 0754 137.4 kg (302 lb 14.6 oz)   11/28/22 0400 137.4 kg (302 lb 14.6 oz)        PHYSICAL EXAM:                                                                 BP 105/73   Pulse 68   Temp 97.5 F (36.4 C) (Oral)   Resp 19   Wt 137.4 kg (302 lb 14.6 oz)   SpO2 90%   BMI 48.89 kg/m     General Appearance:  Breathing comfortable, no acute distress  Head:  normocephalic  Eyes:  EOM's intact, nonicteric sclera  Neck:  No carotid bruit or jugular venous distension.  No HJR.   Lungs: Slightly diminished breath sounds at the bases but overall clear, no wheezing or rales heard.  Cardiac:  RRR, normal S1, S2, no murmur, no rub, S3 or S4  Abdomen:   Soft, non-tender, no rebound or guarding, non-distended, positive bowel sounds  Extremities:  No cyanosis or clubbing. No arthritis or deformities.  Trace lower leg edema.  Vascular: 2+ radial and distal pulses bilaterally  Neurologic:  Alert and oriented x3, mood and affect normal    ----------------------------------------------------------------------------------------------------------------------------------------------------------------------------------------------------------------------------    This note was generated by the Epic EMR system/Dragon speech recognition and may contain inherent errors or omissions not intended by the user. Grammatical errors, random word insertions, deletions and pronoun errors  are occasional consequences of this technology due to software limitations. Not all errors are caught or corrected. If there are questions or concerns about the content of this note or information contained within the body of this dictation they should be addressed directly with the author for clarification.

## 2022-11-28 NOTE — UM Notes (Signed)
Order [id number]   11/27/22 2251  Admit to Observation  Once            Initial Review  Class: Observation  Level of Care: Intermediate Care      Patient Name: [name], [name]   Date of Birth 1981-09-05   MRN: [id number]          Summary:  [name] is a 42 y.o. female who presented to the hospital with complaint of "1 week history of cough with small amount of clear sputum production, followed by intermittent chest heaviness, worse with activity, improves with rest. She presented to the ED earlier this evening and was reportedly wheezy on arrival and hypertensive. 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     Scoliosis     Sleep apnea     no cpap    Type 2 diabetes mellitus, controlled     Vertigo            Active Problems:  Patient Active Problem List   Diagnosis    Acute renal failure superimposed on stage 3 chronic kidney disease    Drug-induced hyperglycemia    Peripheral edema    CHF NYHA class III, acute, combined    Morbid obesity    Hypertension    Fibromyalgia    Diabetic neuropathy associated with type 2 diabetes mellitus    Type II diabetes mellitus, uncontrolled    Type 2 diabetes mellitus with diabetic chronic kidney disease    Chronic heart failure with preserved ejection fraction    Gastroesophageal reflux disease    Depression    Obstructive sleep apnea    Mass of upper outer quadrant of right breast    Neuropathy    Iron deficiency anemia    Ulnar neuropathy    Mixed hyperlipidemia    Asthma    Aneurysm of ascending aorta without rupture    Lesion of left  ulnar nerve    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     HFpEF  HTN  HLD   - echo 01/2022 w/  EF 54%, grade 1 diastolic dysfunction   - received 40mg  IV lasix in ED   - appears euvolemic on exam   - resume home bumex 2mg  BID   - resume carvedilol, statin, ARB     CKD 4  Hyperkalemia   - cr appears at/near baseline   - monitor I&O   - avoid nephrotoxins   - received IV lasix in ED   - recheck BMP in a.m.   - monitor on tele       DMII  Hyperglycemia   - home levemir dose is 53 units BID   - will cut back to 30 units BID while IP on carb consistent diet for now   - adjust as needed    - SSI   - check A1C in a.m.     H/o DVT   - resume home apixaban 5mg  BID     Anemia of chronic disease   - hgb stable   - follow CBC        Vitals:  Patient Vitals for the past 24 hrs:   BP Temp Temp src Pulse Resp SpO2 Weight   11/28/22 0926 105/73 -- -- 68 -- 90 % --   11/28/22 0858 -- -- -- 66 -- -- --   11/28/22 0754 104/59 97.5 F (36.4 C) Oral 69 19 96 % 137.4 kg (302 lb 14.6 oz)   11/28/22 0427 142/79 97.5 F (36.4 C) Oral 77 17 99 % --   11/28/22 0400 -- -- -- -- -- -- 137.4 kg (302 lb 14.6 oz)   11/28/22 0113 -- -- -- 77 17 99 % --   11/28/22 0044 (!) 172/98 98.2 F (36.8 C) Oral 86 19 98 % --   11/27/22 2345 163/67 -- -- 79 19 98 % --   11/27/22 2315 178/79 -- -- 81 17 99 % --   11/27/22 2245 182/89 -- -- 85 16 99 % --   11/27/22 2216 193/84 -- -- 85 16 100 % --   11/27/22 2145 184/88 -- -- 83 16 100 % --   11/27/22 2117 197/82 -- -- 85 17 100 %  --   11/27/22 2100 (!) 209/90 -- -- 86 14 99 % --   11/27/22 2021 (!) 213/95 -- -- 77 -- 100 % --   11/27/22 1955 -- 98.7 F (37.1 C) Oral 81 15 99 % --   11/27/22 1907 (!) 197/103 98.6 F (37 C) Oral 90 22 99 % --       SpO2: 90 % (11/28/2022  9:26 AM)  O2 Device: None (Room air) (11/28/2022  9:26 AM)  O2 Flow Rate (L/min): 0 L/min (11/28/2022  7:54 AM)      Weight:   Recent Weights for the past 720 hrs (Last 3 readings):   Weight   11/28/22 0754 137.4 kg (302 lb 14.6 oz)   11/28/22 0400 137.4 kg (302 lb 14.6 oz)           Labs:   Latest Reference Range & Units 11/27/22 19:56 11/27/22 22:15   WBC 3.10 - 9.50 x10 3/uL 15.48 (H)    Hemoglobin 11.4 - 14.8 g/dL 9.1 (L)    Hematocrit 34.7 - 43.7 % 31.4 (L)    Platelet Count 142 - 346 x10 3/uL 383 (H)    MCHC 31.5 - 35.8 g/dL 29.0 (L)    Glucose 70 - 100 mg/dL 271 (H)    BUN 7.0 - 21.0 mg/dL 32.0 (H)  Creatinine 0.4 - 1.0 mg/dL 2.1 (H)    Potassium 3.5 - 5.3 mEq/L 5.5 (H)    Chloride 99 - 111 mEq/L 113 (H)    Calcium 8.5 - 10.5 mg/dL 7.9 (L)    eGFR >=57 QI/ONG/2.95 m2 29.4 !    Albumin 3.5 - 5.0 g/dL 2.5 (L)    NT-proBNP 0 - 125 pg/mL 1,368 (H)    Urine Type   Urine, Clean Ca   Protein, UR Negative   >=300 !   Glucose, UA Negative   250 !   Blood, UA Negative   Trace-intact !   RBC UA 0 - 5 /hpf  6-10 !   WBC, UA 0 - 5 /hpf  6-10 !   Hyaline Casts, UA 0 - 5 /lpf  3-5 !           Medications:  One Time Medications:  acetaminophen (TYLENOL) tablet 1,000 mg  Dose: 1,000 mg  Freq: Once Route: PO  Start: 11/27/22 2126 End: 11/27/22 2136    [id number]                   albuterol (PROVENTIL) (2.5 MG/3ML) 0.083% nebulizer solution 2.5 mg  Dose: 2.5 mg  Freq: RT - Once Route: NEBULIZATION  Start: 11/27/22 1953 End: 11/27/22 2017    [id number]                 albuterol-ipratropium (DUO-NEB) 2.5-0.5(3) mg/3 mL nebulizer 3 mL  Dose: 3 mL  Freq: RT - Once Route: NEBULIZATION  Start: 11/27/22 1953 End: 11/27/22 2017    [id number]                   furosemide (LASIX) injection 40 mg  Dose: 40  mg  Freq: Once Route: IV  Start: 11/27/22 2222 End: 11/27/22 2226    [id number]                 hydrALAZINE (APRESOLINE) injection 10 mg  Dose: 10 mg  Freq: Once Route: IV  Start: 11/27/22 2039 End: 11/27/22 2045    [id number]                   ketorolac (TORADOL) injection 15 mg  Dose: 15 mg  Freq: Once Route: IV  Start: 11/27/22 1956 End: 11/27/22 2000    [id number]                   oxyCODONE (ROXICODONE) immediate release tablet 5 mg  Dose: 5 mg  Freq: Once Route: PO  Start: 11/27/22 2314 End: 11/27/22 2321    [id number]                       Plan of Care:   -  Chest 2 Views   -  ECG 12 lead  -  Culture Blood Aerobic and Anaerobic  -  Urinalysis Reflex to Microscopic Exam  -  Trend High Sensitivity Troponin-I  -  Telemetry monitoring  -  Daily weight              ______________________________________________________________________      Care Day - 11/28/2022      Per Nephrology Note:  Recommendations:      Would do daily bumex for now - this will help lower K - she is not that overloaded on exam  Continue sodium bicarbonate 1300mg  tid  Hold home olmesartan for now  Daily labs  Low K diet - would benefit from nutrition  consultation  Glycemic control - this will also help control potassium level   Will add phos, PTH vit D level to AM labs   Will add on iron profile           Vitals:  Patient Vitals for the past 24 hrs:   BP Temp Temp src Pulse Resp SpO2 Weight   11/28/22 1609 167/81 98 F (36.7 C) Oral 66 18 95 % --   11/28/22 1146 130/82 97.9 F (36.6 C) Oral 76 19 98 % --   11/28/22 0926 105/73 -- -- 68 -- 90 % --   11/28/22 0858 -- -- -- 66 -- -- --   11/28/22 0800 -- -- -- -- -- 98 % --   11/28/22 0754 104/59 97.5 F (36.4 C) Oral 69 19 96 % 137.4 kg (302 lb 14.6 oz)   11/28/22 0427 142/79 97.5 F (36.4 C) Oral 77 17 99 % --   11/28/22 0400 -- -- -- -- -- -- 137.4 kg (302 lb 14.6 oz)   11/28/22 0113 -- -- -- 77 17 99 % --   11/28/22 0044 (!) 172/98 98.2 F (36.8 C) Oral 86 19 98 % --   11/27/22 2345 163/67 -- -- 79 19 98  % --   11/27/22 2315 178/79 -- -- 81 17 99 % --   11/27/22 2245 182/89 -- -- 85 16 99 % --   11/27/22 2216 193/84 -- -- 85 16 100 % --   11/27/22 2145 184/88 -- -- 83 16 100 % --   11/27/22 2117 197/82 -- -- 85 17 100 % --   11/27/22 2100 (!) 209/90 -- -- 86 14 99 % --   11/27/22 2021 (!) 213/95 -- -- 77 -- 100 % --   11/27/22 1955 -- 98.7 F (37.1 C) Oral 81 15 99 % --   11/27/22 1907 (!) 197/103 98.6 F (37 C) Oral 90 22 99 % --     SpO2: 95 % (11/28/2022  4:09 PM)  O2 Device: None (Room air) (11/28/2022  4:09 PM)  O2 Flow Rate (L/min): 0 L/min (11/28/2022  4:09 PM)      Weight:   Recent Weights for the past 720 hrs (Last 3 readings):   Weight   11/28/22 0754 137.4 kg (302 lb 14.6 oz)   11/28/22 0400 137.4 kg (302 lb 14.6 oz)         Labs:   Latest Reference Range & Units 11/28/22 04:54   WBC 3.10 - 9.50 x10 3/uL 12.49 (H)   Hemoglobin 11.4 - 14.8 g/dL 8.2 (L)   Hematocrit 09.8 - 43.7 % 28.6 (L)   Glucose 70 - 100 mg/dL 119 (H)   BUN 7.0 - 14.7 mg/dL 82.9 (H)   Creatinine 0.4 - 1.0 mg/dL 2.5 (H)   Sodium 562 - 145 mEq/L 132 (L)   Potassium 3.5 - 5.3 mEq/L 5.5 (H)   Chloride 99 - 111 mEq/L 113 (H)   CO2 17 - 29 mEq/L 16 (L)   Calcium 8.5 - 10.5 mg/dL 7.4 (L)   Anion Gap 5.0 - 15.0  3.0 (L)   eGFR >=60 mL/min/1.73 m2 23.8 !        Latest Reference Range & Units 11/28/22 09:40   Glucose 70 - 100 mg/dL 130 (H)   BUN 7.0 - 86.5 mg/dL 78.4 (H)   Creatinine 0.4 - 1.0 mg/dL 2.6 (H)   Chloride 99 - 111 mEq/L 114 (H)   Calcium 8.5 - 10.5 mg/dL  7.5 (L)   eGFR >=60 mL/min/1.73 m2 22.7 !           Medications:  Current Facility-Administered Medications   Medication Dose Route Frequency    apixaban  5 mg Oral Q12H SCH    ARIPiprazole  10 mg Oral Daily    aspirin  81 mg Oral Daily    atorvastatin  80 mg Oral QHS    budesonide-formoterol  2 puff Inhalation BID    bumetanide  2 mg Oral BID    carvedilol  25 mg Oral Q12H SCH    cefTRIAXone  1 g Intravenous Q24H    docusate sodium  100 mg Oral QHS    DULoxetine  20 mg Oral Daily     famotidine  20 mg Oral Daily    folic acid  1 mg Oral Daily    insulin glargine  30 Units Subcutaneous Q12H    insulin lispro  1-4 Units Subcutaneous QHS    insulin lispro  1-8 Units Subcutaneous TID AC    montelukast  10 mg Oral QHS    pregabalin  150 mg Oral BID    sertraline  50 mg Oral Daily    sodium bicarbonate  1,300 mg Oral TID    traZODone  50 mg Oral QHS     albuterol (PROVENTIL) (2.5 MG/3ML) 0.083% nebulizer solution 2.5 mg  Dose: 2.5 mg  Freq: RT - Every 4 hours scheduled Route: NEBULIZATION  Start: 11/28/22 0000 End: 11/28/22 0106    16109   0106-D/C'd                sodium bicarbonate tablet 650 mg  Dose: 650 mg  Freq: 2 times daily Route: PO  Start: 11/28/22 0100 End: 11/28/22 0747    [id number]   0747-D/C'd                One Time Medications:  calcium gluconate 1 g in 50 mL premix  Dose: 1 g  Freq: Every 1 hour Route: IV  Last Dose: Stopped (11/28/22 1557)  Start: 11/28/22 0900 End: 11/28/22 1557    [id number]   [id number]   1430 [C]13   [id number]                insulin lispro injection 5 Units  Dose: 5 Units  Freq: Once Route: SC  Start: 11/28/22 0715 End: 11/28/22 6578    [id number]                   prochlorperazine (COMPAZINE) injection 5 mg  Dose: 5 mg  Freq: Once Route: IV  Start: 11/28/22 0400 End: 11/28/22 0428    [id number]                   sodium polystyrene (KAYEXALATE) 15 GM/60ML suspension 15 g  Dose: 15 g  Freq: Once Route: PO  Start: 11/28/22 0815 End: 11/28/22 0819    [id number]                     PRN Medications:  benzocaine-menthol (CEPACOL/CHLORASEPTIC) lozenge 1 lozenge  Dose: 1 lozenge  Freq: Every 2 hours PRN Route: BU  PRN Reason: sore throat  Start: 11/27/22 2326    [id number]                 benzonatate (TESSALON) capsule 100 mg  Dose: 100 mg  Freq: 3 times daily PRN Route: PO  PRN Reason: Cough  Start: 11/27/22 2326    [id number]  melatonin tablet 3 mg  Dose: 3 mg  Freq: At bedtime PRN Route: PO  PRN Reason: Sleep  Start: 11/27/22 2326    01423                   oxyCODONE (ROXICODONE)  immediate release tablet 5 mg  Dose: 5 mg  Freq: Every 4 hours PRN Route: PO  PRN Reason: moderate pain  Start: 11/27/22 2329    04314                         Plan of Care:   -  NM Lung Scan Perfusion w/Spect CT   -  IV Ceftriaxone every 24 hours  -  IV Calcium Gluconate x 2  -  IV Prochlorperazine x once  -  Cardiology consult  -  Nephrology consult  -  Telemetry monitoring  -  Daily weight          Doree Albee RN, BSN  UR Case Manager  [facility]  [email or web address]  Phone: [phone]  Fax: [phone]  6:05 PM  11/28/2022                        UTILIZATION REVIEW CONTACT: Name:  Doree Albee, RN BSN  Utilization Review  [facility]  Address:  [address], [place], [place] [address]  NPI:   [id number]  Tax ID:  [id number]  Phone: [phone], Option 2  Fax:  [phone]    Please use fax number [phone] to provide authorization for hospital services or to request additional information.        NOTES TO REVIEWER:    This clinical review is based on/compiled from documentation provided by the treatment team within the patient's medical record.

## 2022-11-28 NOTE — Consults (Signed)
[facility]  CONSULT  Aaron Edelman, x [phone] ([place])      Date Time: 11/28/22 12:22 PM  Patient Name: [name]  Requesting Physician: Lisa Roca, MD  Consulting Physician: Verl Blalock, MD, MD    Primary Care Physician: Adah Perl, DO    Reason for Consultation: CKD, hyperkalemia, metabolic acidosis     Assessment:     Patient Active Problem List   Diagnosis    Acute renal failure superimposed on stage 3 chronic kidney disease    Drug-induced hyperglycemia    Peripheral edema    CHF NYHA class III, acute, combined    Morbid obesity    Hypertension    Fibromyalgia    Diabetic neuropathy associated with type 2 diabetes mellitus    Type II diabetes mellitus, uncontrolled    Type 2 diabetes mellitus with diabetic chronic kidney disease    Chronic heart failure with preserved ejection fraction    Gastroesophageal reflux disease    Depression    Obstructive sleep apnea    Mass of upper outer quadrant of right breast    Neuropathy    Iron deficiency anemia    Ulnar neuropathy    Mixed hyperlipidemia    Asthma    Aneurysm of ascending aorta without rupture    Lesion of left ulnar nerve    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 in August 2023  Anemia - chronic - due to CKD and IDA   HTN - on ARB, BB, loop diuretic - elevated on admission, now better   DM type 2 - HbA1c 6.6%  Jan 2024 (9.4% in March 2023)  Shortness of breath - being treated for asthma  exacerbation; VQ scan ordered   Diastolic heart failure - not overloaded on exam  Morbid obesity   H/o PE - on eliquis     Recommendations:     Would do daily bumex for now - this will help lower K - she is not that overloaded on exam  Continue sodium bicarbonate 1300mg  tid  Hold home olmesartan for now  Daily labs  Low K diet - would benefit from nutrition consultation  Glycemic control - this will also help control potassium level   Will add phos, PTH vit D level to AM labs   Will add on iron profile   Has an appt with [place] in clinic on 12/10/22        Lisa Roca, MD, thank you for this consultation.  We will follow the patient with you during this hospitalization.  Please contact me with any questions or issues.    Signed by: Verl Blalock, MD, MD  [facility]  [phone] (office)  x [phone] ([place])      -----------------------------------------------------------------------------------------------------------        History of Presenting Illness:  [name] is a 42 y.o. female with CKD stage 3B, IDDM type 2, diastolic heart failure, HTN, asthma,and anemia who presented to the hospital with shortness breath and chest tightness. Nephrology consulted for CKD.     Patient last seen in clinic. She takes olmesartan, bumex and BB for BP control. Due to nephrotic range proteinuria (UCPR 8.6g), patient underwent renal biopsy in July 2023 which showed diabetic nephrosclerosis with mod-sev IFTA.     She presented yesterday with 2 day history of worsening shortness of breath and chest tightness. Labs notable for elevated sCr 2.5, hyperkalemia, metabolic acidosis, anemia. Imaging negative for acute pathology. VQ scan ordered.      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 kr    CESAREAN SECTION      x 2    CHOLECYSTECTOMY      EYE SURGERY Bilateral     cataract    HYSTERECTOMY  12/2019    RELEASE, CARPAL TUNNEL Left 07/10/2022    Procedure: RELEASE, CARPAL TUNNEL;  Surgeon: Romilda Joy, MD;  Location: [facility];  Service: Plastics;  Laterality: Left;    REPAIR, UPPER EXTREMITY, TENDON & NERVE Right 12/05/2021    Procedure: REPAIR, RIGHT ULNAR NERVE AND NEUROPLASTY @ ELBOW AND WRIST, RIGHT OPEN CARPAL TUNNEL RELEASE;  Surgeon: Romilda Joy, MD;  Location: [facility];  Service: Plastics;  Laterality: Right;    REPAIR, UPPER EXTREMITY, TENDON & NERVE Left 07/10/2022    Procedure: LEFT ULNAR NERVE NEUROPLASTY AT ELBOW AND WRIST, LEFT OPEN CARPAL TUNNEL RELEASE;  Surgeon: Romilda Joy, MD;  Location: [facility];  Service: Plastics;  Laterality: Left;    UTERINE RUPTURE         Family History:     Family History   Problem Relation Age of Onset    Heart disease Mother     Hypertension Mother     Diabetes Mother     Asthma Father     Hypertension Father     Diabetes Daughter     Asthma Son     Cancer Maternal Aunt     Cancer Maternal Grandmother        Social  History:     Social History     Socioeconomic History    Marital status: Single     Spouse name: Not on file    Number of children: Not on  file    Years of education: Not on file    Highest education level: Not on file   Occupational History    Not on file   Tobacco Use    Smoking status: Never    Smokeless tobacco: Never   Vaping Use    Vaping Use: Never used   Substance and Sexual Activity    Alcohol use: Never    Drug use: Never    Sexual activity: Not on file   Other Topics Concern    Not on file   Social History Narrative    ** Merged History Encounter **          Social Determinants of Health     Financial Resource Strain: Low Risk  (08/26/2022)    Overall Financial Resource Strain (CARDIA)     Difficulty of Paying Living Expenses: Not hard at all   Food Insecurity: Food Insecurity Present (08/26/2022)    Hunger Vital Sign     Worried About [place] in the Last Year: Sometimes true     Ran Out of Food in the Last Year: Never true   Transportation Needs: No Transportation Needs (08/26/2022)    PRAPARE - Armed forces logistics/support/administrative officer (Medical): No     Lack of Transportation (Non-Medical): No   Physical Activity: Insufficiently Active (08/26/2022)    Exercise Vital Sign     Days of Exercise per Week: 2 days     Minutes of Exercise per Session: 30 min   Stress: Stress Concern Present (08/26/2022)    [place]     Feeling of Stress : Very much   Social Connections: Unknown (08/26/2022)    Social Connection and Isolation Panel [NHANES]     Frequency of Communication with Friends and Family: Once a week     Frequency of Social Gatherings with Friends and Family: Patient declined     Attends Religious Services: More than 4 times per year     Active Member of [employer] or Organizations: No     Attends Archivist Meetings: Patient declined     Marital Status: Divorced   Human resources officer Violence: Not At Risk (08/26/2022)    Humiliation, Afraid, Rape, and Kick questionnaire     Fear of Current or Ex-Partner: No     Emotionally Abused: No     Physically Abused: No      Sexually Abused: No   Housing Stability: Low Risk  (08/26/2022)    Housing Stability Vital Sign     Unable to Pay for Housing in the Last Year: No     Number of [place] in the Last Year: 1     Unstable Housing in the Last Year: No       Allergies:     Allergies   Allergen Reactions    Amlodipine Hives    Bactrim [Sulfamethoxazole-Trimethoprim] Hives, Respiratory Distress and Edema    Glipizide Edema     Throat swelling    Lisinopril Edema     Tongue swelling    Losartan Other (See Comments), Swelling and Edema       Medications:     Medications Prior to Admission   Medication Sig  albuterol sulfate HFA (PROVENTIL) 108 (90 Base) MCG/ACT inhaler Inhale 2 puffs into the lungs every 4 (four) hours as needed for Wheezing    apixaban (ELIQUIS) 5 MG Take 1 tablet (5 mg) by mouth every 12 (twelve) hours    ARIPiprazole (ABILIFY) 5 MG tablet Take 2 tablets (10 mg) by mouth daily    aspirin 81 MG chewable tablet Chew 1 tablet (81 mg) by mouth daily    atorvastatin (LIPITOR) 40 MG tablet Take 2 tablets (80 mg) by mouth nightly    bumetanide (BUMEX) 2 MG tablet Take 1 tablet (2 mg total) by mouth 2 (two) times daily    carvedilol (COREG) 25 MG tablet Take 1 tablet (25 mg) by mouth 2 (two) times daily    insulin lispro (HumaLOG) 100 UNIT/ML injection Inject 25 Units into the skin 3 (three) times daily before meals (Patient taking differently: Inject 25 Units into the skin 3 (three) times daily before meals)    olmesartan (BENICAR) 40 MG tablet Take 1 tablet (40 mg) by mouth daily    Alcohol Swabs Pads Use as indicated for insulin injections    diclofenac Sodium (VOLTAREN) 1 % Gel topical gel Apply 2 g topically 4 (four) times daily    docusate sodium (COLACE) 100 MG capsule Take 1 capsule (100 mg) by mouth nightly    DULoxetine (CYMBALTA) 20 MG capsule Take 1 capsule (20 mg) by mouth daily    EPINEPHrine 0.15 MG/0.3ML injection Inject 0.3 mLs (0.15 mg) into the muscle as needed    Fluticasone Propionate, Inhal,  (Fluticasone Propionate Diskus) 50 MCG/ACT Aerosol Pwdr, Breath Activated Inhale into the lungs    folic acid (FOLVITE) 1 MG tablet Take 1 tablet (1 mg) by mouth daily    insulin detemir (Levemir) 100 UNIT/ML injection Inject 50 Units into the skin every 12 (twelve) hours (Patient taking differently: Inject 53 Units into the skin every 12 (twelve) hours)    Insulin Syringe-Needle U-100 27G X 1/2" 1 ML Misc Inject 1 Dose into the skin 4 (four) times daily    montelukast (SINGULAIR) 10 MG tablet Take 1 tablet (10 mg) by mouth nightly    pregabalin (LYRICA) 150 MG capsule Take 1 capsule (150 mg) by mouth 2 (two) times daily    semaglutide (WEGOVY) 1.7 MG/0.75ML injection Inject 0.5 mLs (1 mg) into the skin once a week (Patient not taking: Reported on 07/02/2022)    sertraline (ZOLOFT) 50 MG tablet Take 1 tablet (50 mg) by mouth daily    sodium bicarbonate 650 MG tablet Take 1 tablet (650 mg) by mouth 2 (two) times daily    traZODone (DESYREL) 50 MG tablet Take 1 tablet (50 mg) by mouth nightly        Scheduled Meds: PRN Meds:    apixaban, 5 mg, Oral, Q12H SCH  ARIPiprazole, 10 mg, Oral, Daily  aspirin, 81 mg, Oral, Daily  atorvastatin, 80 mg, Oral, QHS  budesonide-formoterol, 2 puff, Inhalation, BID  bumetanide, 2 mg, Oral, BID  carvedilol, 25 mg, Oral, Q12H SCH  cefTRIAXone, 1 g, Intravenous, Q24H  docusate sodium, 100 mg, Oral, QHS  DULoxetine, 20 mg, Oral, Daily  famotidine, 20 mg, Oral, N82N SCH  folic acid, 1 mg, Oral, Daily  insulin glargine, 30 Units, Subcutaneous, Q12H  insulin lispro, 1-4 Units, Subcutaneous, QHS  insulin lispro, 1-8 Units, Subcutaneous, TID AC  montelukast, 10 mg, Oral, QHS  pregabalin, 150 mg, Oral, BID  sertraline, 50 mg, Oral, Daily  sodium bicarbonate, 1,300 mg, Oral, TID  traZODone, 50 mg, Oral, QHS          Continuous Infusions:   acetaminophen, 650 mg, Q6H PRN  albuterol, 2.5 mg, Q4H PRN  benzocaine-menthol, 1 lozenge, Q2H PRN  benzonatate, 100 mg, TID PRN  artificial tears (REFRESH  PLUS), 1 drop, TID PRN  dextrose, 15 g of glucose, PRN   Or  dextrose, 12.5 g, PRN   Or  dextrose, 12.5 g, PRN   Or  glucagon (rDNA), 1 mg, PRN  hydrALAZINE, 5 mg, Q6H PRN  melatonin, 3 mg, QHS PRN  morphine, 1 mg, Q4H PRN  naloxone, 0.2 mg, PRN  ondansetron, 4 mg, Q4H PRN   Or  ondansetron, 4 mg, Q4H PRN  oxyCODONE, 5 mg, Q4H PRN  polyethylene glycol, 17 g, Daily PRN  saline, 2 spray, Q4H PRN            Review of Systems:   A comprehensive review of systems was per the HPI and below:     General ROS: no f/c, no weight changes  HEENT ROS: no blurry vision  Hematological and Lymphatic ROS: no known bleeding  Respiratory ROS: negative for cough, shortness of breath, or wheezing  Cardiovascular ROS: negative for chest pain or dyspnea on exertion  Gastrointestinal ROS: negative for abd/flank pain, change in bowel habits  Genito-Urinary ROS: negative for dysuria, hematuria, difficulty voiding or nocturia  Musculoskeletal ROS:  negative for trauma or falls, arthralgias  Neurological ROS: no focal weakness      Physical Exam:     Vitals:    11/28/22 0800 11/28/22 0858 11/28/22 0926 11/28/22 1146   BP:   105/73 130/82   Pulse:  66 68 76   Resp:    19   Temp:    97.9 F (36.6 C)   TempSrc:    Oral   SpO2: 98%  90% 98%   Weight:           Intake and Output Summary (Last 24 hours) at Date Time    Intake/Output Summary (Last 24 hours) at 11/28/2022 1222  Last data filed at 11/28/2022 1145  Gross per 24 hour   Intake 560 ml   Output --   Net 560 ml       Recent weights:      07/02/2022     2:01 PM 07/10/2022     8:54 AM 08/01/2022     5:49 AM 08/25/2022     5:20 PM 11/20/2022    10:51 AM 11/28/2022     4:00 AM 11/28/2022     7:54 AM   Weight Monitoring   Height 167.6 cm 167.6 cm 167.6 cm 167.6 cm 167.6 cm     Height Method  Stated Stated Stated Stated     Weight 132.904 kg 133.2 kg 134.265 kg 132.904 kg 135.127 kg 137.4 kg 137.4 kg   Weight Method  Actual  Stated Actual Standing Scale Standing Scale   BMI (calculated) 47.4 kg/m2 47.5  kg/m2 47.9 kg/m2 47.4 kg/m2 48.2 kg/m2         General: awake, alert, oriented x 3, no acute distress.  HEENT: sclera anicteric  Neck: supple  Cardiovascular: regular rate and rhythm  Lungs: clear to auscultation anteriorly, no wheezing  Abdomen: soft, increased adiposity  Extremities: no edema  Neuro: A+O x 3      Labs:     Recent Labs   Lab 11/28/22  0454 11/27/22  1956   WBC 12.49* 15.48*   Hgb 8.2* 9.1*  Hematocrit 28.6* 31.4*   Platelets 292 383*     Recent Labs   Lab 11/28/22  0940 11/28/22  0454 11/27/22  1956   Sodium 138 132* 136   Potassium 5.2 5.5* 5.5*   Chloride 114* 113* 113*   CO2 17 16* 17   BUN 36.0* 35.0* 32.0*   Creatinine 2.6* 2.5* 2.1*   Calcium 7.5* 7.4* 7.9*   Albumin  --   --  2.5*   Glucose 116* 349* 271*   eGFR 22.7* 23.8* 29.4*       Recent Labs   Lab 11/27/22  2215   Urine Type Urine, Clean Ca   Color, UA Yellow   Clarity, UA Clear   Specific Gravity UA 1.025   Urine pH 6.0   Nitrite, UA Negative   Ketones UA Negative   Urobilinogen, UA 0.2   Bilirubin, UA Negative   Blood, UA Trace-intact*   RBC, UA 6-10*   WBC, UA 6-10*           cc: Lisa Roca, MD  Adah Perl, DO

## 2022-11-28 NOTE — Plan of Care (Addendum)
NURSING SHIFT NOTE     Patient: [name]  Day: 0      SHIFT EVENTS     Shift Narrative/Significant Events (PRN med administration, fall, RRT, etc.):     Patients alert oriented x 4, on room air but with dyspnea on exertion, plan of care discussed, ambulatory with standby assist, oriented to room set up, personal belongings within reach, pain reliever given for back pain, BP is on monitoring, extra insulin given for hyperkalemia    Safety and fall precautions remain in place. Purposeful rounding completed.          ASSESSMENT     Changes in assessment from patient's baseline this shift:    Neuro: No  CV: No  Pulm: No  Peripheral Vascular: No  HEENT: No  GI: No  BM during shift: No, Last BM:  11-26-22  GU: No   Integ: No  MS: No    Pain: Improved  Pain Interventions: Medications  Medications Utilized: oxycodone    Mobility: PMP Activity: Step 6 - Walks in Room of             Lines     Patient Lines/Drains/Airways Status       Active Lines, Drains and Airways       Name Placement date Placement time Site Days    Peripheral IV 11/27/22 Right Antecubital 11/27/22  1955  Antecubital  less than 1                         VITAL SIGNS     Vitals:    11/28/22 0113   BP:    Pulse: 77   Resp: 17   Temp:    SpO2: 99%       Temp  Min: 98.2 F (36.8 C)  Max: 98.7 F (37.1 C)  Pulse  Min: 77  Max: 90  Resp  Min: 14  Max: 22  BP  Min: 163/67  Max: 213/95  SpO2  Min: 98 %  Max: 100 %      Intake/Output Summary (Last 24 hours) at 11/28/2022 0314  Last data filed at 11/28/2022 0300  Gross per 24 hour   Intake 200 ml   Output --   Net 200 ml          CARE PLAN       Problem: Moderate/High Fall Risk Score >5  Goal: Patient will remain free of falls  11/28/2022 0311 by Christella Hartigan, RN  Outcome: Progressing  11/28/2022 0311 by Christella Hartigan, RN  Outcome: Progressing  Flowsheets (Taken 11/28/2022 0311)  Moderate Risk (6-13):   LOW-Fall Interventions Appropriate for Low Fall Risk   LOW-Anticoagulation education for injury  risk   MOD-Floor mat at bedside (where available) if appropriate  High (Greater than 13): LOW-Fall Interventions Appropriate for Low Fall Risk  VH Moderate Risk (6-13):   ALL REQUIRED LOW INTERVENTIONS   INITIATE YELLOW "FALL RISK" SIGNAGE   YELLOW NON-SKID SLIPPERS   Remain with patient during toileting  VH High Risk (Greater than 13):   ALL REQUIRED LOW INTERVENTIONS   ALL REQUIRED MODERATE INTERVENTIONS     Problem: Hemodynamic Status: Cardiac  Goal: Stable vital signs and fluid balance  11/28/2022 0311 by Christella Hartigan, RN  Outcome: Progressing  Flowsheets (Taken 11/28/2022 0311)  Stable vital signs and fluid balance:   Assess signs and symptoms associated with cardiac rhythm changes   Monitor lab values  11/28/2022 0311 by Thomes Lolling,  Sarra Rachels, RN  Outcome: Progressing     Problem: Inadequate Tissue Perfusion  Goal: Adequate tissue perfusion will be maintained  11/28/2022 0311 by Christella Hartigan, RN  Outcome: Progressing  Flowsheets (Taken 11/28/2022 [phone])  Adequate tissue perfusion will be maintained:   Monitor/assess lab values and report abnormal values   Monitor/assess for signs of VTE (edema of calf/thigh redness, pain)   Encourage/assist patient as needed to turn, cough, and perform deep breathing every 2 hours   Monitor for signs and symptoms of a pulmonary embolism (dyspnea, tachypnea, tachycardia, confusion)   Monitor/assess neurovascular status (pulses, capillary refill, pain, paresthesia, paralysis, presence of edema)  11/28/2022 0311 by Christella Hartigan, RN  Outcome: Progressing     Problem: Ineffective Gas Exchange  Goal: Effective breathing pattern  11/28/2022 0311 by Christella Hartigan, RN  Outcome: Progressing  Flowsheets (Taken 11/28/2022 0311)  Effective breathing pattern:   Maintain CO2 level per LIP order   Teach/reinforce use of ordered respiratory interventions (ie. CPAP, BiPAP, Incentive Spirometer, Acapella)   Monitor end tidal CO2 level per LIP order  11/28/2022 0311 by Christella Hartigan,  RN  Outcome: Progressing     Problem: Fluid and Electrolyte Imbalance/ Endocrine  Goal: Fluid and electrolyte balance are achieved/maintained  11/28/2022 0311 by Christella Hartigan, RN  Outcome: Progressing  Flowsheets (Taken 11/28/2022 0311)  Fluid and electrolyte balance are achieved/maintained:   Monitor/assess lab values and report abnormal values   Assess and reassess fluid and electrolyte status   Observe for cardiac arrhythmias   Monitor for muscle weakness  11/28/2022 0311 by Christella Hartigan, RN  Outcome: Progressing  Goal: Adequate hydration  11/28/2022 0311 by Christella Hartigan, RN  Outcome: Progressing  Flowsheets (Taken 11/28/2022 0311)  Adequate hydration:   Assess mucus membranes, skin color, turgor, perfusion and presence of edema   Monitor and assess vital signs and perfusion   Assess for peripheral, sacral, periorbital and abdominal edema  11/28/2022 0311 by Christella Hartigan, RN  Outcome: Progressing     Problem: Fluid and Electrolyte Imbalance/ Endocrine  Goal: Adequate hydration  11/28/2022 0311 by Christella Hartigan, RN  Outcome: Progressing  Flowsheets (Taken 11/28/2022 0311)  Adequate hydration:   Assess mucus membranes, skin color, turgor, perfusion and presence of edema   Monitor and assess vital signs and perfusion   Assess for peripheral, sacral, periorbital and abdominal edema  11/28/2022 0311 by Christella Hartigan, RN  Outcome: Progressing     Problem: Diabetes: Glucose Imbalance  Goal: Blood glucose stable at established goal  11/28/2022 0311 by Christella Hartigan, RN  Outcome: Progressing  Flowsheets (Taken 11/28/2022 0311)  Blood glucose stable at established goal:   Monitor lab values   Include patient/family in decisions related to nutrition/dietary selections   Coordinate medication administration with meals, as indicated   Ensure appropriate consults are obtained (Nutrition, Diabetes Education, and Case Management)   Assess for hypoglycemia /hyperglycemia   Monitor intake and output.  Notify LIP if  urine output is < 30 mL/hour.   Ensure appropriate diet and assess tolerance   Ensure adequate hydration   Monitor/assess vital signs   Follow fluid restrictions/IV/PO parameters   Ensure patient/family has adequate teaching materials  11/28/2022 0311 by Christella Hartigan, RN  Outcome: Progressing

## 2022-11-28 NOTE — Respiratory Progress Note (Signed)
11/28/22 0113   Patient Assessment   Charting Type Assessment   Status Completed   Pulmonary History Asthma;Sleep Apnea   Mobility Ambulatory with or without assistance   CXR No acute disease   Cough Productive;Spontaneous   Heart Rate 77   Resp Rate 17   Bilateral Breath Sounds Clear   Cough Description   Secretion Amount Small   Sputum Color White   Sputum Consistency Thick   Sputum How Obtained Spontaneous cough   Home regimen   Home Treatments labuterol mdi. prn , Breo   Oxygen Therapy   SpO2 99 %   O2 Device None (Room air)   Indications   Secretion Clearance None indicated   Lung Expansion None indicated   Medications Hx of COPD, Asthma, Bronchitis or other documented RAD   Medication Score   Breath Sounds/Air Movement 0   Pulm Status 1   Resp Pattern/WOB 0   Medication Score 1   Initial Recommendations   Recommendations/Interventions Continue home regimen   Expected Outcomes   Meds Return to baseline home regimen   Outcomes met   Meds Yes   Reassessment Recommendations   Recommendations Continue current treatment plan   Plan of Care   Plan of Care maintain current POC     Respiratory Therapy Patient Assessment    A2114/A2114-01  11/28/22 1:17 AM  RT: Monika Salk, RT      Admitting DX: Asthma exacerbation attacks [J45.901]    Pulmonary History: Asthma, Sleep Apnea    Other Pulm Hx:      Therapy ordered:     IP Meds - Nasal and Inhaled (From admission, onward)      Start     Stop Status Route Frequency Ordered    11/28/22 0115  albuterol (PROVENTIL) (2.5 MG/3ML) 0.083% nebulizer solution 2.5 mg         -- Verified NEBULIZATION RT - Every 4 hours as needed 11/28/22 0106               PT able to take deep breath? Yes               Airway: Natural   Mobility: Ambulatory with or without assistance  CXR: No acute disease    Cough Effort: Strong Secretion Amount: Small    Sputum Consistency: Thick     Can clear secretions with cough? Yes  Can clear secretions with suctioning? N/A     Social History      Tobacco Use   Smoking Status Never   Smokeless Tobacco Never        Breath Sounds:  Bilateral Breath Sounds: Clear          Heart Rate: 77 Resp Rate: 17  SpO2: 99 % O2 Device: None (Room air)          Home regimen:  Home Treatments: labuterol mdi. prn , Breo           Criteria for therapy:  Secretion Clearance: None indicated  Lung Expansion: None indicated  Medications: Hx of COPD, Asthma, Bronchitis or other documented RAD    Recommendations/Interventions:  Recommendations/Interventions: Continue home regimen     Expected Outcomes:        Meds: Return to baseline home regimen      Re-Evaluation:  Follow-up Date:   Improving with Therapy: Yes    Plan of Care Recommendations:  Plan of Care: maintain current POC

## 2022-11-28 NOTE — Progress Notes (Signed)
CMA at bedside. CM to call back shortly.  CM called back and completed assessment.  Assessment Smart phrase for obs and low risk patient     Introduced [employer] and self to patient and/or family Chemical engineer (name and contact tel #)  Patient (See facesheet)  Discussed plan of care and possible discharge needs with patient/family/care-giver (Y, N) - if family/care-giver, provide name:  Yes  Patient has cognitive ability to participate in care decisions and follow-up care as needed (Y, N) - provide comments if no:  Yes  Verified (and corrected if needed) demographics and PCP on facesheet (Y, N)  Yes  Assessed for lack of insurance or underinsured. Darreld Mclean, N) On file verified.  If no insurance or underinsured, patient is high risk and needs high risk assessment     Patient is NOT identified as a Medicare focused patient or high risk for failed discharge plan or readmission as detailed in the High Risk Patient Screening policy (Y, N)  Not high risk or Medicare focused.  Plan of care: Pt reports being independent prior-no DME use. No financial concerns identified at this time. Denies any safety concerns. Family to transport on discharge.    Discharge Plan A: Home with family.  Discharge Plan B:    Expected discharge date: 11/29/22    Barriers to discharge: none              a) Barriers needing escalation:              b) Escalated to:       Tentative d/c date and CM # on white board: remote assessment    Comments: CM to follow hospital course for additional needs.

## 2022-11-28 NOTE — Progress Notes (Addendum)
[facility]  PROGRESS NOTE      Patient: [name]  Date: 11/28/2022   LOS: 0 Days  Admission Date: 11/27/2022   MRN: [id number]  Attending: Gwyndolyn Kaufman, MD  When on service as the attending, please contact me on Epic Secure Chat from 7 AM - 7 PM for non-urgent issues. For urgent matters use XTend page from 7 AM - 7 PM.       ASSESSMENT/PLAN     [name] is a 42 y.o. female admitted with Asthma exacerbation attacks    Interval Summary:     Patient Active Hospital Problem List:  Asthma exacerbation attacks (11/27/2022)     Continue DuoNebs  Continue with Singulair  Add low-dose steroids to regimen  Will continue to monitor blood sugars on steroids    Chest pains with dyspnea  VQ scan negative for PE  Patient being followed by cardiology team may likely need ischemic workup  Continue with Coreg, aspirin and statins.  Plan discussed with Dr. [facility]    CHF with preserved ejection fraction  Continue diuretics.    Rule out acute bronchitis  Continue IV ceftriaxone.    Diabetes mellitus  Continue with Lantus and sliding scale coverage.  Monitor blood sugar and adjust medications    Hyperkalemia  Likely due to metabolic acidosis  This is improved with Kayexalate and IV calcium gluconate    CKD with AKI  Patient seen by Dr. Lendell Caprice nephrology much appreciated  Avoid nephrotoxic agents    DVT PE  Resume anticoagulation with Eliquis 5 mg p.o. twice daily every 12 hours      Anemia due to CKD  Monitor serial CBCs  Iron profile ordered by nephrology      Plan discussed with patient and bedside RN               Nutrition: Consistent carbohydrate cardiac diet          Patient has BMI=Body mass index is 48.89 kg/m.  Diagnosis: Obesity based on BMI criteria         Recent Labs     11/28/22  0940 11/28/22  0454 11/27/22  1956   Sodium 138 132* 136     Diagnosis: Mild Hyponatremia      Recent Labs     11/28/22  0940 11/28/22  0454 11/27/22  1956   Potassium 5.2 5.5* 5.5*      Diagnosis: Hyperkalemia        Code Status: Full Code    Dispo: To be determined    Family Contact: Patient's daughter    DVT Prophylaxis:   Current Facility-Administered Medications (Includes Only Anticoagulants, Misc. Hematological)   Medication Dose Route Last Admin    apixaban (ELIQUIS) tablet 5 mg  5 mg Oral 5 mg at 11/28/22 3016          CHART  REVIEW & DISCUSSION     The following chart items were reviewed as of 8:32 PM on 11/28/22:  [x]  Lab Results [x]  Imaging Results   []  Problem List  [x]  Current Orders [x]  Current Medications  []  Allergies  [x]  Code Status [x]  Previous Notes   []  SDoH    The management and plan of care for this patient was discussed with the following specialty consultants:  [x]  Cardiology  []  Gastroenterology                 []  Infectious Disease  []  Pulmonology []  Neurology                [  x] Nephrology  []  Neurosurgery []  Orthopedic Surgery  []  Heme/Onc  []  General Surgery []  Psychiatry                                   []  Palliative    SUBJECTIVE     [name] she is feeling better but has dyspnea with exertion    MEDICATIONS     Current Facility-Administered Medications   Medication Dose Route Frequency    apixaban  5 mg Oral Q12H SCH    ARIPiprazole  10 mg Oral Daily    aspirin  81 mg Oral Daily    atorvastatin  80 mg Oral QHS    budesonide-formoterol  2 puff Inhalation BID    bumetanide  2 mg Oral BID    carvedilol  25 mg Oral Q12H SCH    cefTRIAXone  1 g Intravenous Q24H    docusate sodium  100 mg Oral QHS    DULoxetine  20 mg Oral Daily    famotidine  20 mg Oral Daily    folic acid  1 mg Oral Daily    insulin glargine  30 Units Subcutaneous Q12H    insulin lispro  1-4 Units Subcutaneous QHS    insulin lispro  1-8 Units Subcutaneous TID AC    montelukast  10 mg Oral QHS    pregabalin  150 mg Oral BID    sertraline  50 mg Oral Daily    sodium bicarbonate  1,300 mg Oral TID    traZODone  50 mg Oral QHS       PHYSICAL EXAM     Vitals:    11/28/22 1938   BP:    Pulse:  79   Resp:    Temp:    SpO2:        Temperature: Temp  Min: 97.5 F (36.4 C)  Max: 98.2 F (36.8 C)  Pulse: Pulse  Min: 66  Max: 86  Respiratory: Resp  Min: 14  Max: 19  Non-Invasive BP: BP  Min: 104/59  Max: 209/90  Pulse Oximetry SpO2  Min: 90 %  Max: 100 %    Intake and Output Summary (Last 24 hours) at Date Time    Intake/Output Summary (Last 24 hours) at 11/28/2022 2032  Last data filed at 11/28/2022 1749  Gross per 24 hour   Intake 1350 ml   Output 300 ml   Net 1050 ml         GEN APPEARANCE: This lady in mild respiratory distress A&OX3  HEENT: PERLA; EOMI; Conjunctiva Clear  NECK: Supple; No bruits  CVS: RRR, S1, S2; No M/G/R  LUNGS: Basilar rales  ABD: Soft; No TTP; + Normoactive BS  EXT: No edema; Pulses 2+ and intact  NEURO: CN 2-12 intact; No Focal neurological deficits  MENTAL STATUS: Awake alert and oriented x 3    LABS     Recent Labs   Lab 11/28/22  0454 11/27/22  1956   WBC 12.49* 15.48*   RBC 3.19* 3.60*   Hgb 8.2* 9.1*   Hematocrit 28.6* 31.4*   MCV 89.7 87.2   Platelets 292 383*       Recent Labs   Lab 11/28/22  0940 11/28/22  0454 11/27/22  1956   Sodium 138 132* 136   Potassium 5.2 5.5* 5.5*   Chloride 114* 113* 113*   CO2 17 16* 17   BUN 36.0* 35.0*  32.0*   Creatinine 2.6* 2.5* 2.1*   Glucose 116* 349* 271*   Calcium 7.5* 7.4* 7.9*       Recent Labs   Lab 11/27/22  1956   ALT 9   AST (SGOT) 11   Bilirubin, Total 0.2   Albumin 2.5*   Alkaline Phosphatase 97       Recent Labs   Lab 11/27/22  2152 11/27/22  1956   hs Troponin-I 10.7 8.9   hs Troponin-I Delta 1.8  --              Microbiology Results (last 15 days)       Procedure Component Value Units Date/Time    Urine culture [[id number]] Collected: 11/28/22 0940    Order Status: Sent Specimen: Urine, Clean Catch Updated: 11/28/22 1453    Narrative:      Indications for Urine Culture:->Costovertebral Angle  Tenderness    Culture Blood Aerobic and Anaerobic [[id number]] Collected: 11/28/22 0806    Order Status: Sent Specimen: Blood, Venipuncture  Updated: 11/28/22 1131    Narrative:      The order will result in two separate 8-54ml bottles  Please do NOT order repeat blood cultures if one has been  drawn within the last 48 hours  UNLESS concerned for  endocarditis  AVOID BLOOD CULTURE DRAWS FROM CENTRAL LINE IF POSSIBLE  Indications:->Sepsis  1 BLUE+1 PURPLE    Culture Blood Aerobic and Anaerobic [[id number]] Collected: 11/28/22 0806    Order Status: Sent Specimen: Blood, Venipuncture Updated: 11/28/22 1131    Narrative:      The order will result in two separate 8-18ml bottles  Please do NOT order repeat blood cultures if one has been  drawn within the last 48 hours  UNLESS concerned for  endocarditis  AVOID BLOOD CULTURE DRAWS FROM CENTRAL LINE IF POSSIBLE  Indications:->Sepsis  1 BLUE+1 PURPLE    COVID-19 (SARS-CoV-2) and Influenza A/B, NAA (Liat Rapid) [[id number]] Collected: 11/27/22 1956    Order Status: Completed Specimen: Culturette from Nasopharyngeal Updated: 11/27/22 2026     Purpose of COVID testing Diagnostic -[facility]I     SARS-CoV-2 Specimen Source Nasal Swab     SARS CoV 2 Overall Result Not Detected     Comment: __________________________________________________  -A result of "Detected" indicates POSITIVE for the    presence of SARS CoV-2 RNA  -A result of "Not Detected" indicates NEGATIVE for the    presence of SARS CoV-2 RNA  __________________________________________________________  Test performed using the Roche cobas Liat SARS-CoV-2 assay. This assay is  only for use under the Food and Drug Administration's Emergency Use  Authorization. This is a real-time RT-PCR assay for the qualitative  detection of SARS-CoV-2 RNA. Viral nucleic acids may persist in vivo,  independent of viability. Detection of viral nucleic acid does not imply the  presence of infectious virus, or that virus nucleic acid is the cause of  clinical symptoms. Negative results do not preclude SARS-CoV-2 infection and  should not be used as the sole basis for diagnosis,  treatment or other  patient management decisions. Negative results must be combined with  clinical observations, patient history, and/or epidemiological information.  Invalid results may be due to inhibiting substances in the specimen and  recollection should occur. Please see Fact Sheets for patients and providers  located:  https://www.benson-chung.com/          Influenza A Not Detected     Influenza B Not Detected     Comment: Test performed using the  Roche cobas Liat SARS-CoV-2 & Influenza A/B assay.  This assay is only for use under the Food and Drug Administration's  Emergency Use Authorization. This is a multiplex real-time RT-PCR assay  intended for the simultaneous in vitro qualitative detection and  differentiation of SARS-CoV-2, influenza A, and influenza B virus RNA. Viral  nucleic acids may persist in vivo, independent of viability. Detection of  viral nucleic acid does not imply the presence of infectious virus, or that  virus nucleic acid is the cause of clinical symptoms. Negative results do  not preclude SARS-CoV-2, influenza A, and/or influenza B infection and  should not be used as the sole basis for diagnosis, treatment or other  patient management decisions. Negative results must be combined with  clinical observations, patient history, and/or epidemiological information.  Invalid results may be due to inhibiting substances in the specimen and  recollection should occur. Please see Fact Sheets for patients and providers  located: http://olson-hall.info/.         Narrative:      o Collect and clearly label specimen type:  o PREFERRED-Upper respiratory specimen: One Nasal Swab in  Transport Media.  o Hand deliver to laboratory ASAP  Diagnostic -PUI             RADIOLOGY     NM Lung Scan Perfusion w/Spect CT    Result Date: 11/28/2022   Normal perfusion lung scan. Ruby Cola, MD 11/28/2022 2:37 PM    Chest 2 Views    Result Date: 11/27/2022  1. No acute disease.  Loel Ro, MD 11/27/2022 8:18 PM    [place] VenoDopp Low Extremity Left    Result Date: 11/20/2022   No evidence of left lower extremity DVT. Darnelle Bos, MD 11/20/2022 12:36 PM   Echo Results       None          No results found for this or any previous visit.    Signed,  Lisa Roca, MD  8:32 PM 11/28/2022

## 2022-11-29 LAB — COMPREHENSIVE METABOLIC PANEL
ALT: 6 U/L (ref 0–55)
AST (SGOT): 5 U/L (ref 5–41)
Albumin/Globulin Ratio: 0.8 — ABNORMAL LOW (ref 0.9–2.2)
Albumin: 2.3 g/dL — ABNORMAL LOW (ref 3.5–5.0)
Alkaline Phosphatase: 88 U/L (ref 37–117)
Anion Gap: 6 (ref 5.0–15.0)
BUN: 44 mg/dL — ABNORMAL HIGH (ref 7.0–21.0)
Bilirubin, Total: 0.1 mg/dL — ABNORMAL LOW (ref 0.2–1.2)
CO2: 19 mEq/L (ref 17–29)
Calcium: 7.5 mg/dL — ABNORMAL LOW (ref 8.5–10.5)
Chloride: 111 mEq/L (ref 99–111)
Creatinine: 3.5 mg/dL — ABNORMAL HIGH (ref 0.4–1.0)
Globulin: 3 g/dL (ref 2.0–3.6)
Glucose: 162 mg/dL — ABNORMAL HIGH (ref 70–100)
Potassium: 5.8 mEq/L — ABNORMAL HIGH (ref 3.5–5.3)
Protein, Total: 5.3 g/dL — ABNORMAL LOW (ref 6.0–8.3)
Sodium: 136 mEq/L (ref 135–145)
eGFR: 15.9 mL/min/{1.73_m2} — AB (ref >=60)

## 2022-11-29 LAB — WHOLE BLOOD GLUCOSE POCT
Whole Blood Glucose POCT: 166 mg/dL — ABNORMAL HIGH (ref 70–100)
Whole Blood Glucose POCT: 171 mg/dL — ABNORMAL HIGH (ref 70–100)
Whole Blood Glucose POCT: 185 mg/dL — ABNORMAL HIGH (ref 70–100)
Whole Blood Glucose POCT: 173 mg/dL — ABNORMAL HIGH (ref 70–100)
Whole Blood Glucose POCT: 180 mg/dL — ABNORMAL HIGH (ref 70–100)
Whole Blood Glucose POCT: 190 mg/dL — ABNORMAL HIGH (ref 70–100)

## 2022-11-29 LAB — CBC AND DIFFERENTIAL
Absolute NRBC: 0 10*3/uL (ref 0.00–0.00)
Basophils Absolute Automated: 0.05 10*3/uL (ref 0.00–0.08)
Basophils Automated: 0.4 %
Eosinophils Absolute Automated: 0.37 10*3/uL (ref 0.00–0.44)
Eosinophils Automated: 3.1 %
Hematocrit: 29.9 % — ABNORMAL LOW (ref 34.7–43.7)
Hgb: 8.8 g/dL — ABNORMAL LOW (ref 11.4–14.8)
Immature Granulocytes Absolute: 0.06 10*3/uL (ref 0.00–0.07)
Immature Granulocytes: 0.5 %
Instrument Absolute Neutrophil Count: 7.46 10*3/uL — ABNORMAL HIGH (ref 1.10–6.33)
Lymphocytes Absolute Automated: 3.09 10*3/uL (ref 0.42–3.22)
Lymphocytes Automated: 26.1 %
MCH: 25.8 pg (ref 25.1–33.5)
MCHC: 29.4 g/dL — ABNORMAL LOW (ref 31.5–35.8)
MCV: 87.7 fL (ref 78.0–96.0)
MPV: 10.5 fL (ref 8.9–12.5)
Monocytes Absolute Automated: 0.8 10*3/uL (ref 0.21–0.85)
Monocytes: 6.8 %
Neutrophils Absolute: 7.46 10*3/uL — ABNORMAL HIGH (ref 1.10–6.33)
Neutrophils: 63.1 %
Nucleated RBC: 0 /100 WBC (ref 0.0–0.0)
Platelets: 339 10*3/uL (ref 142–346)
RBC: 3.41 10*6/uL — ABNORMAL LOW (ref 3.90–5.10)
RDW: 16 % — ABNORMAL HIGH (ref 11–15)
WBC: 11.83 10*3/uL — ABNORMAL HIGH (ref 3.10–9.50)

## 2022-11-29 LAB — POTASSIUM: Potassium: 5.5 mEq/L — ABNORMAL HIGH (ref 3.5–5.3)

## 2022-11-29 MED ORDER — SODIUM POLYSTYRENE SULFONATE 15 GM/60ML PO SUSP
15.0000 g | Freq: Once | ORAL | Status: DC
Start: 2022-11-29 — End: 2022-11-29

## 2022-11-29 MED ORDER — PLASMA-LYTE A IV INFUSION
INTRAVENOUS | Status: AC
Start: 2022-11-29 — End: 2022-11-29

## 2022-11-29 MED ORDER — SODIUM POLYSTYRENE SULFONATE 15 GM/60ML PO SUSP
15.0000 g | Freq: Once | ORAL | Status: AC
Start: 2022-11-29 — End: 2022-11-29
  Administered 2022-11-29: 15 g via ORAL
  Filled 2022-11-29: qty 60

## 2022-11-29 NOTE — Progress Notes (Signed)
[facility] PROGRESS NOTE  [id number]      Date Time: 11/29/22 9:14 AM  Patient Name: [name]  Attending Physician: Lisa Roca, MD    CC: follow-up AKI/CKD    Assessment:     AKI - ATN from hypoxia?  Maybe from diuretics?  Hold today, IVF x 1 L today.  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  Hyperkalemia - kayex x 1 now and add low K diet  Acidosis - cont po bicarb  SHP with hypoCa - low alb, PTH ~ goal  Anemia - add ESA now  HTN - cont to hold ARB  DM2 - as above, A1C 6.6 in Jan  SOB - on abx for asthma/bronchitis  Diastolic heart failure - not overloaded on exam  Morbid obesity   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       Recommendations:     Kayex x 1 now  Add renal diet  Hold bumex today - IVF x 1 L today  BMP q 12 for now  Cont to hold ARB  Add ESA      Case discussed with: pt and RN      Laurice Record, MD  [facility]  [id number] (office)      Subjective:   No c/o     Review of Systems:   No cp    Physical Exam:     Vitals:    11/28/22 2307 11/29/22 0438 11/29/22 0500 11/29/22 0700   BP: 149/81 150/82     Pulse: 88 82  87   Resp: 16 19     Temp: 98.1 F (36.7 C) 98.2 F (36.8 C)     TempSrc: Oral Oral     SpO2: 98% 99%     Weight:   140 kg (308 lb 10.3 oz)        Intake and Output Summary (Last 24 hours) at Date Time    Intake/Output Summary (Last 24 hours) at 11/29/2022 0914  Last data filed at 11/29/2022 0400  Gross per 24 hour   Intake 2050 ml   Output 250 ml   Net 1800 ml       General: awake, alert, oriented x 3, no acute distress  Cardiovascular: regular rate and rhythm, no murmurs, rubs or gallops  Lungs: clear to auscultation bilaterally, without wheezing, rhonchi, or rales  Abdomen: soft, non-tender, non-distended, normoactive bowel sounds  Extremities: trace B LE edema      Meds:      Scheduled Meds: PRN Meds:    apixaban, 5 mg, Oral, Q12H SCH  ARIPiprazole, 10 mg, Oral, Daily  aspirin, 81 mg, Oral, Daily  atorvastatin, 80 mg, Oral,  QHS  budesonide-formoterol, 2 puff, Inhalation, BID  bumetanide, 2 mg, Oral, BID  carvedilol, 25 mg, Oral, Q12H SCH  cefTRIAXone, 1 g, Intravenous, Q24H  docusate sodium, 100 mg, Oral, QHS  DULoxetine, 20 mg, Oral, Daily  famotidine, 20 mg, Oral, Daily  folic acid, 1 mg, Oral, Daily  insulin glargine, 30 Units, Subcutaneous, Q12H  insulin lispro, 1-4 Units, Subcutaneous, QHS  insulin lispro, 1-8 Units, Subcutaneous, TID AC  montelukast, 10 mg, Oral, QHS  predniSONE, 20 mg, Oral, QAM W/BREAKFAST  pregabalin, 150 mg, Oral, BID  sertraline, 50 mg, Oral, Daily  sodium bicarbonate, 1,300 mg, Oral, TID  sodium polystyrene, 15 g, Oral, Once  traZODone, 50 mg, Oral, QHS          Continuous Infusions:  acetaminophen, 650 mg, Q6H PRN  albuterol, 2.5 mg, Q4H PRN  benzocaine-menthol, 1 lozenge, Q2H PRN  benzonatate, 100 mg, TID PRN  artificial tears (REFRESH PLUS), 1 drop, TID PRN  dextrose, 15 g of glucose, PRN   Or  dextrose, 12.5 g, PRN   Or  dextrose, 12.5 g, PRN   Or  glucagon (rDNA), 1 mg, PRN  hydrALAZINE, 5 mg, Q6H PRN  melatonin, 3 mg, QHS PRN  naloxone, 0.2 mg, PRN  ondansetron, 4 mg, Q4H PRN   Or  ondansetron, 4 mg, Q4H PRN  oxyCODONE, 10 mg, Q6H PRN  oxyCODONE, 5 mg, Q4H PRN  polyethylene glycol, 17 g, Daily PRN  saline, 2 spray, Q4H PRN              Labs:     Recent Labs   Lab 11/29/22  0449 11/28/22  0454 11/27/22  1956   WBC 11.83* 12.49* 15.48*   Hgb 8.8* 8.2* 9.1*   Hematocrit 29.9* 28.6* 31.4*   Platelets 339 292 383*     Recent Labs   Lab 11/29/22  0449 11/28/22  0940 11/28/22  0454 11/27/22  1956   Sodium 136 138 132* 136   Potassium 5.8* 5.2 5.5* 5.5*   Chloride 111 114* 113* 113*   CO2 19 17 16* 17   BUN 44.0* 36.0* 35.0* 32.0*   Creatinine 3.5* 2.6* 2.5* 2.1*   Calcium 7.5* 7.5* 7.4* 7.9*   Albumin 2.3*  --   --  2.5*   Phosphorus  --   --  5.0*  --    Glucose 162* 116* 349* 271*   eGFR 15.9* 22.7* 23.8* 29.4*       Recent Labs   Lab 11/27/22  2215   Urine Type Urine, Clean Ca   Color, UA Yellow    Clarity, UA Clear   Specific Gravity UA 1.025   Urine pH 6.0   Nitrite, UA Negative   Ketones UA Negative   Urobilinogen, UA 0.2   Bilirubin, UA Negative   Blood, UA Trace-intact*   RBC, UA 6-10*   WBC, UA 6-10*           Imaging personally reviewed, including: NM Lung Scan Perfusion w/Spect CT    Result Date: 11/28/2022   Normal perfusion lung scan. Ruby Cola, MD 11/28/2022 2:37 PM         Signed by: Laurice Record, MD

## 2022-11-29 NOTE — Plan of Care (Addendum)
Pt c/o some chest tightness and soreness during start of shift. RT treatments were given and PRN medication given, now resolved and pt currently resting comfortably.   Morning labs showed elevated creatinine and potassium, MD notified of findings.       Problem: Hemodynamic Status: Cardiac  Goal: Stable vital signs and fluid balance  Outcome: Progressing  Flowsheets (Taken 11/29/2022 0351)  Stable vital signs and fluid balance:   Monitor lab values   Assess signs and symptoms associated with cardiac rhythm changes     Problem: Inadequate Gas Exchange  Goal: Adequate oxygenation and improved ventilation  Outcome: Progressing  Flowsheets (Taken 11/29/2022 0351)  Adequate oxygenation and improved ventilation:   Assess lung sounds   Position for maximum ventilatory efficiency   Provide mechanical and oxygen support to facilitate gas exchange   Plan activities to conserve energy: plan rest periods   Increase activity as tolerated/progressive mobility   Teach/reinforce use of incentive spirometer 10 times per hour while awake, cough and deep breath as needed     Problem: Everyday - Heart Failure  Goal: Stable Vital Signs and Fluid Balance  Outcome: Progressing  Flowsheets (Taken 11/29/2022 0351)  Stable Vital Signs and Fluid Balance:   Strict Intake/Output   Assess for swelling/edema   Monitor, assess vital signs and telemetry per policy   Monitor labs and report abnormalities to physician   Daily Standing Weights in the morning using the same scale, after using the bathroom and before breadfast.  If unable to stand, zero the bed and use the bed scale

## 2022-11-29 NOTE — Significant Event (Signed)
Hospitalist PA Note:  Called to the floor by RN due to patient c/o pain behind her ear on the left side. Patient states the pain began approx 24hrs ago. Physical exam pertinent for mild swelling and tenderness to palpation behind the left ear but no LAD. Due to renal fxn, will postpone CT soft tissue neck and continue with current abx and pain management w/ Oxycodone. Cont with current care plan.   Patient also concerned about food options. She would like to discuss what she can eat at home to decrease her potassium intake and not trigger her CHF; dietician consulted.

## 2022-11-29 NOTE — Progress Notes (Signed)
[place] NOTE  Otilio Miu, MD   [facility]  Office phone:  [phone]     [facility] phone [phone]  [facility] phone [phone]    Date:  11/29/2022  7:49 AM    Patient:  [name].  DOB: [birth year].  42 y.o.  female , MRN: [id number]   Attending:  Lisa Roca, MD   Admission date:  11/27/2022     ASSESSMENT & PLAN:   Impression:  Atypical CP, most likely pulm, vs. Uncontrolled HTN related CP (not taking her medications) without ischemic EKG findings or elevation in hsTn -- all reassuring  -- control BP, treat medically at this time, ASA, statin, BP control  -- primary team planning to rule out PE (v/q low prob)     2. HTN, much improved back on meds -- BP is 150/80 mmHg; pt on coreg 25 mg BID; her ARB was held due to elevated K; can consider restarting when K is normal     3. Uncontrolled DM history -- on high dose statin, Lipitor 80 mg QD; A1C 6.6 this admission (was >14 in 2021)     4. Morbid obesity -- exercise, weight loss     5. History of PE -- low prob V/Q scan; back on Eliquis     6. CKD with Cr of 2.5; has been as high as 3.2; now worsening to 3.5     7. Hyperkalemia -- 5.8; her ARB is on hold; K pending -- nephrology now seeing     8.  HFpEF -- appears well compensated; CXR clear, lungs clear on exam     9. Anemia, Hgb chronically 8-9, unclear etiology, ?CKD    10. 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 OSA    12. History of possible asthma      Recommendations:  As above; pt can f/u with her primary cardiologist in [place]; no inpatient plans at this time for further testing in light of normal hsTn and reassuring EKG  High dose statin  Baby ASA  BP control  Weight loss plan with PCP  Exercise for now as tolerated      HISTORY OF PRESENT ILLNESS:   No sign overnight events  Lost IV access  Nephrology seeing    MEDICATIONS:    INFUSION  MEDS:      SCHEDULED MEDS:     Current Facility-Administered Medications   Medication Dose Route Frequency    apixaban  5 mg Oral Q12H [place]    ARIPiprazole  10 mg Oral Daily    aspirin  81 mg Oral Daily    atorvastatin  80 mg Oral QHS    budesonide-formoterol  2 puff Inhalation BID    bumetanide  2 mg Oral BID    carvedilol  25 mg Oral Q12H [place]    cefTRIAXone  1 g Intravenous Q24H    docusate sodium  100 mg Oral QHS    DULoxetine  20 mg Oral Daily    famotidine  20 mg Oral Daily    folic acid  1 mg Oral Daily    insulin glargine  30 Units Subcutaneous Q12H    insulin lispro  1-4 Units Subcutaneous QHS    insulin lispro  1-8 Units Subcutaneous TID AC    montelukast  10 mg Oral QHS    predniSONE  20 mg Oral QAM W/BREAKFAST  pregabalin  150 mg Oral BID    sertraline  50 mg Oral Daily    sodium bicarbonate  1,300 mg Oral TID    sodium polystyrene  15 g Oral Once    traZODone  50 mg Oral QHS      PRN MEDS:   acetaminophen, albuterol, benzocaine-menthol, benzonatate, artificial tears (REFRESH PLUS), dextrose **OR** dextrose **OR** dextrose **OR** glucagon (rDNA), hydrALAZINE, melatonin, naloxone, ondansetron **OR** ondansetron, oxyCODONE, oxyCODONE, polyethylene glycol, saline    DIAGNOSTICS:         LABS:   Labs reviewed:  Recent Labs   Lab 11/29/22  0449 11/28/22  0454 11/27/22  1956   WBC 11.83* 12.49* 15.48*   Hgb 8.8* 8.2* 9.1*   Hematocrit 29.9* 28.6* 31.4*   Platelets 339 292 383*     Recent Labs   Lab 11/29/22  0449 11/28/22  0940 11/28/22  0454   Sodium 136 138 132*   Potassium 5.8* 5.2 5.5*   Chloride 111 114* 113*   CO2 19 17 16*   BUN 44.0* 36.0* 35.0*   Creatinine 3.5* 2.6* 2.5*   eGFR 15.9* 22.7* 23.8*   Glucose 162* 116* 349*   Calcium 7.5* 7.5* 7.4*     Recent Labs   Lab 11/27/22  2152 11/27/22  1956   hs Troponin-I 10.7 8.9   hs Troponin-I Delta 1.8  --      Recent Labs   Lab 11/27/22  1956   NT-proBNP 1,368*         FLUID BALANCE AND WEIGHT:                                           Last filed weight:  Weight: 140 kg (308 lb 10.3 oz)  Weight on Admission: Weight: 137.4 kg (302 lb 14.6 oz)  Net IO Since Admission: 2,000 mL [11/29/22 0749]     Intake/Output Summary (Last 24 hours) at 11/29/2022 0700  Last data filed at 11/29/2022 0400  Gross per 24 hour   Intake 2150 ml   Output 350 ml   Net 1800 ml   Weight change: 0 kg (0 lb)    Last 3 Weights for the past 72 hrs (Last 3 readings):   Weight   11/29/22 0500 140 kg (308 lb 10.3 oz)   11/28/22 0754 137.4 kg (302 lb 14.6 oz)   11/28/22 0400 137.4 kg (302 lb 14.6 oz)        PHYSICAL EXAM:                                              BP 150/82   Pulse 82   Temp 98.2 F (36.8 C) (Oral)   Resp 19   Wt 140 kg (308 lb 10.3 oz)   SpO2 99%   BMI 49.82 kg/m     No distress  Rrr, S1, S2, no sign murmurs  Abd soft and nt  No le edema  Neuro intact    Tele: SR, indep visualized

## 2022-11-29 NOTE — Progress Notes (Signed)
[facility]  PROGRESS NOTE      Patient: [name]  Date: 11/29/2022   LOS: 0 Days  Admission Date: 11/27/2022   MRN: [id number]  Attending: Lisa Roca, MD  When on service as the attending, please contact me on [place] from 7 AM - 7 PM for non-urgent issues. For urgent matters use XTend page from 7 AM - 7 PM.       ASSESSMENT/PLAN     [name] is a 42 y.o. female admitted with Asthma exacerbation attacks    Interval Summary:     Patient Active Hospital Problem List:  Asthma exacerbation attacks (11/27/2022)     Continue DuoNebs  Continue with Singulair  No  Wheezing on exam today therefore would discontinue steroids  Will continue to monitor blood sugars on steroids    Chest pains with dyspnea  VQ scan negative for PE  Patient being followed by cardiology team may likely need ischemic workup  Continue with Coreg, aspirin and statins.  Plan discussed with Dr. [facility]    CHF with preserved ejection fraction    Rule out acute bronchitis  Continue IV ceftriaxone.    Diabetes mellitus  Continue with Lantus and sliding scale coverage.  Monitor blood sugar and adjust medications    Hyperkalemia  Likely due to metabolic acidosis  This improved with Kayexalate and IV calcium gluconate on 11/28/2022  Potassium elevated again this morning and therefore ARB has been discontinued or held will give Kayexalate and repeat potassium later today.    CKD with AKI  Patient seen by Dr. Conley Canal and Dr. Nevin Bloodgood nephrology much appreciated  Avoid nephrotoxic agents  Likely due to diuretics diuretics temporarily on hold    DVT PE  Resume anticoagulation with Eliquis 5 mg p.o. twice daily every 12 hours      Anemia due to CKD  Monitor serial CBCs  Iron profile ordered by nephrology      Plan discussed with patient and bedside RN               Nutrition: Consistent carbohydrate cardiac diet          Patient has BMI=Body mass index is 49.82 kg/m.  Diagnosis: Obesity based  on BMI criteria           Recent Labs     11/29/22  0449 11/28/22  0940 11/28/22  0454 11/27/22  1956   Sodium 136 138 132* 136       Diagnosis: Mild Hyponatremia          Recent Labs     11/29/22  0449 11/28/22  0940 11/28/22  0454 11/27/22  1956   Potassium 5.8* 5.2 5.5* 5.5*       Diagnosis: Hyperkalemia        Code Status: Full Code    Dispo: To be determined    Family Contact: Patient's daughter    DVT Prophylaxis:   Current Facility-Administered Medications (Includes Only Anticoagulants, Misc. Hematological)   Medication Dose Route Last Admin    apixaban (ELIQUIS) tablet 5 mg  5 mg Lynn 5 mg at 11/29/22 0940          CHART  REVIEW & DISCUSSION     The following chart items were reviewed as of 12:04 PM on 11/29/22:  [x]  Lab Results [x]  Imaging Results   []  Problem List  [x]  Current Orders [x]  Current Medications  []  Allergies  [x]  Code Status [x]  Previous  Notes   []  SDoH    The management and plan of care for this patient was discussed with the following specialty consultants:  [x]  Cardiology  []  Gastroenterology                 []  Infectious Disease  []  Pulmonology []  Neurology                [x]  Nephrology  []  Neurosurgery []  Orthopedic Surgery  []  Heme/Onc  []  General Surgery []  Psychiatry                                   []  Palliative    SUBJECTIVE     [name] she is feeling better but had  dyspnea with exertion 11/28/2022    MEDICATIONS     Current Facility-Administered Medications   Medication Dose Route Frequency    apixaban  5 mg Lynn Q12H [place]    ARIPiprazole  10 mg Lynn Daily    aspirin  81 mg Lynn Daily    atorvastatin  80 mg Lynn QHS    budesonide-formoterol  2 puff Inhalation BID    carvedilol  25 mg Lynn Q12H [place]    cefTRIAXone  1 g Intravenous Q24H    docusate sodium  100 mg Lynn QHS    DULoxetine  20 mg Lynn Daily    famotidine  20 mg Lynn Daily    folic acid  1 mg Lynn Daily    insulin glargine  30 Units Subcutaneous Q12H    insulin lispro  1-4 Units Subcutaneous QHS    insulin  lispro  1-8 Units Subcutaneous TID AC    montelukast  10 mg Lynn QHS    pregabalin  150 mg Lynn BID    sertraline  50 mg Lynn Daily    sodium bicarbonate  1,300 mg Lynn TID    traZODone  50 mg Lynn QHS       PHYSICAL EXAM     Vitals:    11/29/22 1156   BP: (!) 149/128   Pulse: 79   Resp: 15   Temp:    SpO2: 100%       Temperature: Temp  Min: 97.9 F (36.6 C)  Max: 98.2 F (36.8 C)  Pulse: Pulse  Min: 66  Max: 88  Respiratory: Resp  Min: 15  Max: 19  Non-Invasive BP: BP  Min: 126/76  Max: 167/81  Pulse Oximetry SpO2  Min: 95 %  Max: 100 %    Intake and Output Summary (Last 24 hours) at Date Time    Intake/Output Summary (Last 24 hours) at 11/29/2022 1204  Last data filed at 11/29/2022 0400  Gross per 24 hour   Intake 1690 ml   Output 250 ml   Net 1440 ml           GEN APPEARANCE: This lady in mild respiratory distress A&OX3  HEENT: PERLA; EOMI; Conjunctiva Clear  NECK: Supple; No bruits  CVS: RRR, S1, S2; No M/G/R  LUNGS: Basilar rales  ABD: Soft; No TTP; + Normoactive BS  EXT: No edema; Pulses 2+ and intact  NEURO: CN 2-12 intact; No Focal neurological deficits  MENTAL STATUS: Awake alert and oriented x 3    LABS     Recent Labs   Lab 11/29/22  0449 11/28/22  0454 11/27/22  1956   WBC 11.83* 12.49* 15.48*   RBC 3.41* 3.19*  3.60*   Hgb 8.8* 8.2* 9.1*   Hematocrit 29.9* 28.6* 31.4*   MCV 87.7 89.7 87.2   Platelets 339 292 383*         Recent Labs   Lab 11/29/22  0449 11/28/22  0940 11/28/22  0454 11/27/22  1956   Sodium 136 138 132* 136   Potassium 5.8* 5.2 5.5* 5.5*   Chloride 111 114* 113* 113*   CO2 19 17 16* 17   BUN 44.0* 36.0* 35.0* 32.0*   Creatinine 3.5* 2.6* 2.5* 2.1*   Glucose 162* 116* 349* 271*   Calcium 7.5* 7.5* 7.4* 7.9*         Recent Labs   Lab 11/29/22  0449 11/27/22  1956   ALT 6 9   AST (SGOT) 5 11   Bilirubin, Total 0.1* 0.2   Albumin 2.3* 2.5*   Alkaline Phosphatase 88 97         Recent Labs   Lab 11/27/22  2152 11/27/22  1956   hs Troponin-I 10.7 8.9   hs Troponin-I Delta 1.8  --                 Microbiology Results (last 15 days)       Procedure Component Value Units Date/Time    Urine culture [[id number]] Collected: 11/28/22 0940    Order Status: Sent Specimen: Urine, Clean Catch Updated: 11/28/22 1453    Narrative:      Indications for Urine Culture:->Costovertebral Angle  Tenderness    Culture Blood Aerobic and Anaerobic [[id number]] Collected: 11/28/22 0806    Order Status: Sent Specimen: Blood, Venipuncture Updated: 11/28/22 1131    Narrative:      The order will result in two separate 8-39ml bottles  Please do NOT order repeat blood cultures if one has been  drawn within the last 48 hours  UNLESS concerned for  endocarditis  AVOID BLOOD CULTURE DRAWS FROM CENTRAL LINE IF POSSIBLE  Indications:->Sepsis  1 BLUE+1 PURPLE    Culture Blood Aerobic and Anaerobic [[id number]] Collected: 11/28/22 0806    Order Status: Sent Specimen: Blood, Venipuncture Updated: 11/28/22 1131    Narrative:      The order will result in two separate 8-7ml bottles  Please do NOT order repeat blood cultures if one has been  drawn within the last 48 hours  UNLESS concerned for  endocarditis  AVOID BLOOD CULTURE DRAWS FROM CENTRAL LINE IF POSSIBLE  Indications:->Sepsis  1 BLUE+1 PURPLE    COVID-19 (SARS-CoV-2) and Influenza A/B, NAA (Liat Rapid) [[id number]] Collected: 11/27/22 1956    Order Status: Completed Specimen: Culturette from Nasopharyngeal Updated: 11/27/22 2026     Purpose of COVID testing Diagnostic -PUI     SARS-CoV-2 Specimen Source Nasal Swab     SARS CoV 2 Overall Result Not Detected     Comment: __________________________________________________  -A result of "Detected" indicates POSITIVE for the    presence of SARS CoV-2 RNA  -A result of "Not Detected" indicates NEGATIVE for the    presence of SARS CoV-2 RNA  __________________________________________________________  Test performed using the Roche cobas Liat SARS-CoV-2 assay. This assay is  only for use under the Food and Drug Administration's Emergency  Use  Authorization. This is a real-time RT-PCR assay for the qualitative  detection of SARS-CoV-2 RNA. Viral nucleic acids may persist in vivo,  independent of viability. Detection of viral nucleic acid does not imply the  presence of infectious virus, or that virus nucleic acid is the cause of  clinical symptoms. Negative results do not preclude SARS-CoV-2 infection and  should not be used as the sole basis for diagnosis, treatment or other  patient management decisions. Negative results must be combined with  clinical observations, patient history, and/or epidemiological information.  Invalid results may be due to inhibiting substances in the specimen and  recollection should occur. Please see Fact Sheets for patients and providers  located:  https://www.benson-chung.com/          Influenza A Not Detected     Influenza B Not Detected     Comment: Test performed using the Roche cobas Liat SARS-CoV-2 & Influenza A/B assay.  This assay is only for use under the Food and Drug Administration's  Emergency Use Authorization. This is a multiplex real-time RT-PCR assay  intended for the simultaneous in vitro qualitative detection and  differentiation of SARS-CoV-2, influenza A, and influenza B virus RNA. Viral  nucleic acids may persist in vivo, independent of viability. Detection of  viral nucleic acid does not imply the presence of infectious virus, or that  virus nucleic acid is the cause of clinical symptoms. Negative results do  not preclude SARS-CoV-2, influenza A, and/or influenza B infection and  should not be used as the sole basis for diagnosis, treatment or other  patient management decisions. Negative results must be combined with  clinical observations, patient history, and/or epidemiological information.  Invalid results may be due to inhibiting substances in the specimen and  recollection should occur. Please see Fact Sheets for patients and providers  located:  http://olson-hall.info/.         Narrative:      o Collect and clearly label specimen type:  o PREFERRED-Upper respiratory specimen: One Nasal Swab in  Transport Media.  o Hand deliver to laboratory ASAP  Diagnostic -PUI             RADIOLOGY     NM Lung Scan Perfusion w/Spect CT    Result Date: 11/28/2022   Normal perfusion lung scan. Ruby Cola, MD 11/28/2022 2:37 PM    Chest 2 Views    Result Date: 11/27/2022  1. No acute disease. Loel Ro, MD 11/27/2022 8:18 PM    [place] VenoDopp Low Extremity Left    Result Date: 11/20/2022   No evidence of left lower extremity DVT. Darnelle Bos, MD 11/20/2022 12:36 PM   Echo Results       None          No results found for this or any previous visit.    Signed,  Lisa Roca, MD  12:04 PM 11/29/2022

## 2022-11-29 NOTE — UM Notes (Addendum)
CONCURRENT REVIEW November 29, 2022    Upgraded  11/29/22 1308  Admit to Inpatient  Once        Diagnosis: Asthma Exacerbation Attacks  Level of Care: Intermediate Care  Patient Class: Inpatient   References:    [facility] Bed Placement Criteria    [facility] Bed Placement Criteria    [facility] Bed Placement Criteria    [facility] Bed Placement Criteria    [facility] Bed Placement Criteria    New Service Definitions Feb 2023   Question Answer Comment   Admitting Physician Sherril Croon C    Service: Medicine    Estimated Length of Stay 3 - 5 Days    Tentative Discharge Plan? Home or Self Care    Does patient need telemetry? Yes    Is patient 18 yrs or greater? Yes    Telemetry type (separate Telemetry order is also required): Adult telemetry              PATIENT NAME: [name], [name]  DOB: August 01, 1981    History of present illness: Pt is a 42 y.o. female arrived to hospital on 11/27/2022 at 1938.    BP 141/86   Pulse 78   Temp 98.2 F (36.8 C) (Oral)   Resp 19   Wt 140 kg (308 lb 10.3 oz)   SpO2 99%   BMI 49.82 kg/m     Temp:  [97.9 F (36.6 C)-98.2 F (36.8 C)]   Heart Rate:  [66-88]   Resp Rate:  [16-19]   BP: (126-167)/(73-86)   SpO2:  [95 %-100 %]   Weight:  [140 kg (308 lb 10.3 oz)]     Last recorded pain score:  Pain Scale Used: Numeric Scale (0-10)  Pain Score: 6-moderate pain       Abnormal Labs:   Lab Results last 48 Hours       Procedure Component Value Units Date/Time    Glucose Whole Blood - POCT [[id number]]  (Abnormal) Collected: 11/29/22 0732     Updated: 11/29/22 0739     Whole Blood Glucose POCT 171 mg/dL     Comprehensive metabolic panel [[id number]]  (Abnormal) Collected: 11/29/22 0449    Specimen: Blood Updated: 11/29/22 0534     Glucose 162 mg/dL      BUN 44.0 mg/dL      Creatinine 3.5 mg/dL      Potassium 5.8 mEq/L      Calcium 7.5 mg/dL      Protein, Total 5.3 g/dL      Albumin 2.3 g/dL      Bilirubin, Total 0.1 mg/dL      Albumin/Globulin Ratio 0.8     eGFR 15.9 mL/min/1.73 m2     CBC and differential [[id number]]   (Abnormal) Collected: 11/29/22 0449    Specimen: Blood Updated: 11/29/22 0514     WBC 11.83 x10 3/uL      Hgb 8.8 g/dL      Hematocrit 29.9 %      RBC 3.41 x10 6/uL      MCHC 29.4 g/dL      RDW 16 %      Instrument Absolute Neutrophil Count 7.46 x10 3/uL      Neutrophils Absolute 7.46 x10 3/uL     Glucose Whole Blood - POCT [[id number]]  (Abnormal) Collected: 11/29/22 0218     Updated: 11/29/22 0221     Whole Blood Glucose POCT 166 mg/dL     Glucose Whole Blood - POCT [[id number]]  (Abnormal) Collected: 11/28/22 2157  Updated: 11/28/22 2206     Whole Blood Glucose POCT 228 mg/dL     Hemolysis index [[id number]]  (Abnormal) Collected: 11/28/22 1503     Updated: 11/28/22 2120     Hemolysis Index 206 Index     PTH, Intact [[id number]]  (Abnormal) Collected: 11/28/22 1503    Specimen: Blood Updated: 11/28/22 1930     PTH Intact 195.3 pg/mL     Vitamin D,25 OH, Total [[id number]]  (Abnormal) Collected: 11/28/22 1503    Specimen: Blood Updated: 11/28/22 1922     Vitamin D, 55 OH, Total 8 ng/mL     Phosphorus [[id number]]  (Abnormal) Collected: 11/28/22 0454    Specimen: Blood Updated: 11/28/22 1353     Phosphorus 5.0 mg/dL     Hemoglobin A1C [[id number]]  (Abnormal) Collected: 11/28/22 0454    Specimen: Blood Updated: 11/28/22 1108     Hemoglobin A1C 6.6 %     Basic Metabolic Panel [[id number]]  (Abnormal) Collected: 11/28/22 0940    Specimen: Blood Updated: 11/28/22 1103     Glucose 116 mg/dL      BUN 36.0 mg/dL      Creatinine 2.6 mg/dL      Calcium 7.5 mg/dL      Chloride 114 mEq/L      eGFR 22.7 mL/min/1.73 m2     Glucose Whole Blood - POCT [[id number]]  (Abnormal) Collected: 11/28/22 0750     Updated: 11/28/22 0815     Whole Blood Glucose POCT 233 mg/dL     Glucose Whole Blood - POCT [[id number]]  (Abnormal) Collected: 11/28/22 0646     Updated: 11/28/22 0719     Whole Blood Glucose POCT 299 mg/dL     Basic Metabolic Panel [[id number]]  (Abnormal) Collected: 11/28/22 0454    Specimen: Blood Updated: 11/28/22 0547      Glucose 349 mg/dL      BUN 35.0 mg/dL      Creatinine 2.5 mg/dL      Calcium 7.4 mg/dL      Sodium 132 mEq/L      Potassium 5.5 mEq/L      Chloride 113 mEq/L      CO2 16 mEq/L      Anion Gap 3.0     eGFR 23.8 mL/min/1.73 m2     CBC and differential [[id number]]  (Abnormal) Collected: 11/28/22 0454    Specimen: Blood Updated: 11/28/22 0532     WBC 12.49 x10 3/uL      Hgb 8.2 g/dL      Hematocrit 28.6 %      RBC 3.19 x10 6/uL      MCHC 28.7 g/dL      Instrument Absolute Neutrophil Count 8.06 x10 3/uL      Neutrophils Absolute 8.06 x10 3/uL     Glucose Whole Blood - POCT [[id number]]  (Abnormal) Collected: 11/28/22 0056     Updated: 11/28/22 0059     Whole Blood Glucose POCT 243 mg/dL     High Sensitivity Troponin-I at 2 hrs with calculated Delta [[id number]] Collected: 11/27/22 2152    Specimen: Blood Updated: 11/27/22 2333     hs Troponin-I 10.7 ng/L      hs Troponin-I Delta 1.8 ng/L     Urinalysis Reflex to Microscopic Exam- Reflex to Culture [[id number]]  (Abnormal) Collected: 11/27/22 2215     Protein, UR >=300     Glucose, UA 250     Blood, UA Trace-intact     RBC, UA 6-10 /hpf  WBC, UA 6-10 /hpf      Hyaline Casts, UA 3-5 /lpf     NT-proBNP [id number]  (Abnormal) Collected: 11/27/22 1956     Updated: 11/27/22 2202     NT-proBNP 1,368 pg/mL     COVID-19 (SARS-CoV-2) and Influenza A/B, NAA (Liat Rapid) [id number] Collected: 11/27/22 1956    Specimen: Culturette from Nasopharyngeal Updated: 11/27/22 2026     Purpose of COVID testing Diagnostic -PUI     SARS-CoV-2 Specimen Source Nasal Swab     SARS CoV 2 Overall Result Not Detected     Influenza A Not Detected     Influenza B Not Detected    Comprehensive metabolic panel [id number]  (Abnormal) Collected: 11/27/22 1956    Specimen: Blood Updated: 11/27/22 2025     Glucose 271 mg/dL      BUN 32.0 mg/dL      Creatinine 2.1 mg/dL      Potassium 5.5 mEq/L      Chloride 113 mEq/L      Calcium 7.9 mg/dL      Protein, Total 5.8 g/dL      Albumin 2.5 g/dL       Albumin/Globulin Ratio 0.8     eGFR 29.4 mL/min/1.73 m2     CBC and differential [id number]  (Abnormal) Collected: 11/27/22 1956    Specimen: Blood Updated: 11/27/22 2008     WBC 15.48 x10 3/uL      Hgb 9.1 g/dL      Hematocrit 31.4 %      Platelets 383 x10 3/uL      RBC 3.60 x10 6/uL      MCHC 29.0 g/dL      Instrument Absolute Neutrophil Count 11.23 x10 3/uL      Neutrophils Absolute 11.23 x10 3/uL      Monocytes Absolute Automated 0.89 x10 3/uL      Immature Granulocytes Absolute 0.12 x10 3/uL      Diagnostics:  NM Lung Scan Perfusion w/Spect CT    Result Date: 11/28/2022   Normal perfusion lung scan. Ruby Cola, MD 11/28/2022 2:37 PM     MD Notes:  Cardiology 11/29/2022  ASSESSMENT & PLAN:  Impression:  1. Atypical CP, most likely pulm, vs. Uncontrolled HTN related CP (not taking her medications) without ischemic EKG findings or elevation in hsTn -- all reassuring  -- control BP, treat medically at this time, ASA, statin, BP control  -- primary team planning to rule out PE (v/q low prob)  2. HTN, much improved back on meds -- BP is 150/80 mmHg; pt on coreg 25 mg BID; her ARB was held due to elevated K; can consider restarting when K is normal  3. Uncontrolled DM history -- on high dose statin, Lipitor 80 mg QD; A1C 6.6 this admission (was >14 in 2021)  4. Morbid obesity -- exercise, weight loss  5. History of PE -- low prob V/Q scan; back on Eliquis  6. CKD with Cr of 2.5; has been as high as 3.2; now worsening to 3.5  7. Hyperkalemia -- 5.8; her ARB is on hold; K pending -- nephrology now seeing  8.  HFpEF -- appears well compensated; CXR clear, lungs clear on exam  9. Anemia, Hgb chronically 8-9, unclear etiology, ?CKD  10. 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 OSA  12. History of possible asthma  Recommendations:  As above; pt can f/u with her primary  cardiologist in [place]; no inpatient plans at this time for further  testing in light of normal hsTn and reassuring EKG  High dose statin  Baby ASA  BP control  Weight loss plan with PCP  Exercise for now as tolerated    Nephrology 11/29/2022  Assessment:  1. AKI - ATN from hypoxia?  Maybe from diuretics?  Hold today, IVF x 1 L today.  2. 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  2. Hyperkalemia - kayex x 1 now and add low K diet  3. Acidosis - cont po bicarb  4. SHP with hypoCa - low alb, PTH ~ goal  5. Anemia - add ESA now  6. HTN - cont to hold ARB  7. DM2 - as above, A1C 6.6 in Jan  8. SOB - on abx for asthma/bronchitis  9. Diastolic heart failure - not overloaded on exam  10. Morbid obesity   11. 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   Recommendations:  1. Kayex x 1 now  2. Add renal diet  3. Hold bumex today - IVF x 1 L today  4. BMP q 12 for now  5. Cont to hold ARB  6. Add ESA     MD 11/29/2022  ASSESSMENT/PLAN  Mardene Celeste Lacrisha Field is a 42 y.o. female admitted with Asthma exacerbation attacks  Interval Summary:   Patient Active Hospital Problem List:  Asthma exacerbation attacks (11/27/2022)     Continue DuoNebs  Continue with Singulair  No  Wheezing on exam today therefore would discontinue steroids  Will continue to monitor blood sugars on steroids  Chest pains with dyspnea  VQ scan negative for PE  Patient being followed by cardiology team may likely need ischemic workup  Continue with Coreg, aspirin and statins.  Plan discussed with Dr. [facility]  CHF with preserved ejection fraction  Rule out acute bronchitis  Continue IV ceftriaxone.  Diabetes mellitus  Continue with Lantus and sliding scale coverage.  Monitor blood sugar and adjust medications  Hyperkalemia  Likely due to metabolic acidosis  This improved with Kayexalate and IV calcium gluconate on 11/28/2022  Potassium elevated again this morning and therefore ARB has been discontinued or held will give Kayexalate and repeat potassium later today.  CKD with AKI  Patient seen by Dr.  Conley Canal and Dr. Nevin Bloodgood nephrology much appreciated  Avoid nephrotoxic agents  Likely due to diuretics diuretics temporarily on hold  DVT PE  Resume anticoagulation with Eliquis 5 mg p.o. twice daily every 12 hours  Anemia due to CKD  Monitor serial CBCs  Iron profile ordered by nephrology  Plan discussed with patient and bedside RN  Nutrition: Consistent carbohydrate cardiac diet  Patient has BMI=Body mass index is 49.82 kg/m.  Diagnosis: Obesity based on BMI criteria   Diagnosis: Mild Hyponatremia  Diagnosis: Hyperkalemia  Code Status: Full Code  Dispo: To be determined      Medications: Scheduled Meds:  Current Facility-Administered Medications   Medication Dose Route Frequency    apixaban  5 mg Oral Q12H [facility]    ARIPiprazole  10 mg Oral Daily    aspirin  81 mg Oral Daily    atorvastatin  80 mg Oral QHS    budesonide-formoterol  2 puff Inhalation BID    carvedilol  25 mg Oral Q12H [facility]    cefTRIAXone  1 g Intravenous Q24H    docusate sodium  100 mg Oral QHS    DULoxetine  20 mg Oral Daily    famotidine  20 mg Oral Daily    folic acid  1 mg Oral Daily    insulin glargine  30 Units Subcutaneous Q12H    insulin lispro  1-4 Units Subcutaneous QHS    insulin lispro  1-8 Units Subcutaneous TID AC    montelukast  10 mg Oral QHS    pregabalin  150 mg Oral BID    sertraline  50 mg Oral Daily    sodium bicarbonate  1,300 mg Oral TID    traZODone  50 mg Oral QHS     Continuous Infusions:   Plasma-Lyte A Stopped (11/29/22 1100)     PRN Meds:.acetaminophen, albuterol, benzocaine-menthol, benzonatate, artificial tears (REFRESH PLUS), dextrose **OR** dextrose **OR** dextrose **OR** glucagon (rDNA), hydrALAZINE, melatonin, naloxone, ondansetron **OR** ondansetron, oxyCODONE, oxyCODONE, polyethylene glycol, saline    oxyCODONE (ROXICODONE) immediate release tablet 5 mg  Dose: 5 mg  Freq: Every 4 hours PRN Route: PO  PRN Reason: moderate pain  Start: 11/27/22 2329 given x3       This clinical review is based on compiled  documentation provided by the treatment team within the patient's medical record.      UTILIZATION REVIEW CONTACT :   Regis Bill, MSN, RN, ACM  Utilization Review Case Manager ll   Phone: [phone] (vm only)  [place]: [phone]  Fax Line [phone]  Cell [phone] (8a-5p)  [email or web address] [email or web address]    Tax ID: [id number]  [facility]   ([place]) [facility]   ([place]) [facility]   [facility]) [facility]   [facility]) [facility]  [facility])   [place].  [place], [place] [id number] [address]  [place], [place] [address] [address]  [place], [place] [address] [phone] [facility].  [place], [place] [address] [address]  [place], [place] [address]   NPI: [id number] NPI: [id number]: [id number]: [id number]: [id number]

## 2022-11-29 NOTE — Plan of Care (Signed)
NURSING SHIFT NOTE     Patient: Alisea Matte  Day: 0      SHIFT EVENTS     Shift Narrative/Significant Events (PRN med administration, fall, RRT, etc.):     A/Ox4. Denies Dyspnea. Up-trending Cr and MD notified. Bumex held. IVF per Nephro. Initial bladder scan shows 322mL. Patient voided 1L. Bladder scan after void shows 80mL. Patient states that she usually goes to bathroom for urination twice a day. Good appetite. Tolerating PO intake well. Chronic back pain, relieved by prn Oxycodone and heat packs.     Safety and fall precautions remain in place. Purposeful rounding completed.          ASSESSMENT     Changes in assessment from patient's baseline this shift:    Neuro: No  CV: No  Pulm: No  Peripheral Vascular: No  HEENT: No  GI: No  BM during shift: No, Last BM: Last BM Date: 11/26/22  GU: No   Integ: No  MS: No    Pain: Improved  Pain Interventions: Medications  Medications Utilized: Oxycodone    Mobility: PMP Activity: Step 6 - Walks in Room of [employer] (ft) (Step 6,7): 30 Feet           Lines     Patient Lines/Drains/Airways Status       Active Lines, Drains and Airways       Name Placement date Placement time Site Days    Peripheral IV 11/29/22 20 G Anterior;Left;Proximal Forearm 11/29/22  1504  Forearm  less than 1                         VITAL SIGNS     Vitals:    11/29/22 1531   BP: 151/82   Pulse: 79   Resp: 15   Temp:    SpO2: 100%       Temp  Min: 97.9 F (36.6 C)  Max: 98.2 F (36.8 C)  Pulse  Min: 66  Max: 88  Resp  Min: 15  Max: 19  BP  Min: 126/76  Max: 167/81  SpO2  Min: 95 %  Max: 100 %      Intake/Output Summary (Last 24 hours) at 11/29/2022 1533  Last data filed at 11/29/2022 1400  Gross per 24 hour   Intake 1718 ml   Output 1250 ml   Net 468 ml            CARE PLAN       Problem: Hemodynamic Status: Cardiac  Goal: Stable vital signs and fluid balance  Outcome: Progressing  Flowsheets (Taken 11/29/2022 0351 by Si Raider, RN)  Stable vital signs and fluid balance:   Monitor lab  values   Assess signs and symptoms associated with cardiac rhythm changes     Problem: Fluid and Electrolyte Imbalance/ Endocrine  Goal: Fluid and electrolyte balance are achieved/maintained  Outcome: Progressing  Flowsheets (Taken 11/28/2022 0311 by Christella Hartigan, RN)  Fluid and electrolyte balance are achieved/maintained:   Monitor/assess lab values and report abnormal values   Assess and reassess fluid and electrolyte status   Observe for cardiac arrhythmias   Monitor for muscle weakness     Problem: Inadequate Gas Exchange  Goal: Adequate oxygenation and improved ventilation  Outcome: Progressing  Flowsheets (Taken 11/29/2022 0351 by Si Raider, RN)  Adequate oxygenation and improved ventilation:   Assess lung sounds   Position for maximum ventilatory efficiency   Provide mechanical and oxygen  support to facilitate gas exchange   Plan activities to conserve energy: plan rest periods   Increase activity as tolerated/progressive mobility   Teach/reinforce use of incentive spirometer 10 times per hour while awake, cough and deep breath as needed

## 2022-11-30 DIAGNOSIS — I5189 Other ill-defined heart diseases: Secondary | ICD-10-CM

## 2022-11-30 DIAGNOSIS — I10 Essential (primary) hypertension: Secondary | ICD-10-CM

## 2022-11-30 LAB — COMPREHENSIVE METABOLIC PANEL
ALT: 8 U/L (ref 0–55)
AST (SGOT): 13 U/L (ref 5–41)
Albumin/Globulin Ratio: 0.7 — ABNORMAL LOW (ref 0.9–2.2)
Albumin: 2.3 g/dL — ABNORMAL LOW (ref 3.5–5.0)
Alkaline Phosphatase: 78 U/L (ref 37–117)
Anion Gap: 7 (ref 5.0–15.0)
BUN: 59 mg/dL — ABNORMAL HIGH (ref 7.0–21.0)
Bilirubin, Total: 0.3 mg/dL (ref 0.2–1.2)
CO2: 19 mEq/L (ref 17–29)
Calcium: 7.1 mg/dL — ABNORMAL LOW (ref 8.5–10.5)
Chloride: 110 mEq/L (ref 99–111)
Creatinine: 4 mg/dL — ABNORMAL HIGH (ref 0.4–1.0)
Globulin: 3.2 g/dL (ref 2.0–3.6)
Glucose: 168 mg/dL — ABNORMAL HIGH (ref 70–100)
Potassium: 6.1 mEq/L (ref 3.5–5.3)
Protein, Total: 5.5 g/dL — ABNORMAL LOW (ref 6.0–8.3)
Sodium: 136 mEq/L (ref 135–145)
eGFR: 13.6 mL/min/{1.73_m2} — AB (ref >=60)

## 2022-11-30 LAB — ECG 12-LEAD
Atrial Rate: 89 {beats}/min
IHS MUSE NARRATIVE AND IMPRESSION: NORMAL
P Axis: 49 degrees
P-R Interval: 150 ms
Q-T Interval: 374 ms
QRS Duration: 76 ms
QTC Calculation (Bezet): 455 ms
R Axis: 7 degrees
T Axis: 105 degrees
Ventricular Rate: 89 {beats}/min

## 2022-11-30 LAB — CBC AND DIFFERENTIAL
Absolute NRBC: 0 10*3/uL (ref 0.00–0.00)
Basophils Absolute Automated: 0.04 10*3/uL (ref 0.00–0.08)
Basophils Automated: 0.4 %
Eosinophils Absolute Automated: 0.32 10*3/uL (ref 0.00–0.44)
Eosinophils Automated: 3 %
Hematocrit: 26.8 % — ABNORMAL LOW (ref 34.7–43.7)
Hgb: 7.8 g/dL — ABNORMAL LOW (ref 11.4–14.8)
Immature Granulocytes Absolute: 0.04 10*3/uL (ref 0.00–0.07)
Immature Granulocytes: 0.4 %
Instrument Absolute Neutrophil Count: 6.61 10*3/uL — ABNORMAL HIGH (ref 1.10–6.33)
Lymphocytes Absolute Automated: 2.92 10*3/uL (ref 0.42–3.22)
Lymphocytes Automated: 27.3 %
MCH: 25.8 pg (ref 25.1–33.5)
MCHC: 29.1 g/dL — ABNORMAL LOW (ref 31.5–35.8)
MCV: 88.7 fL (ref 78.0–96.0)
MPV: 11 fL (ref 8.9–12.5)
Monocytes Absolute Automated: 0.78 10*3/uL (ref 0.21–0.85)
Monocytes: 7.3 %
Neutrophils Absolute: 6.61 10*3/uL — ABNORMAL HIGH (ref 1.10–6.33)
Neutrophils: 61.6 %
Nucleated RBC: 0 /100 WBC (ref 0.0–0.0)
Platelets: 317 10*3/uL (ref 142–346)
RBC: 3.02 10*6/uL — ABNORMAL LOW (ref 3.90–5.10)
RDW: 16 % — ABNORMAL HIGH (ref 11–15)
WBC: 10.71 10*3/uL — ABNORMAL HIGH (ref 3.10–9.50)

## 2022-11-30 LAB — CULTURE, METHICILLIN RESISTANT STAPHYLOCOCCUS AUREUS (MRSA)
Culture MRSA Surveillance: NEGATIVE
Culture MRSA Surveillance: POSITIVE — AB

## 2022-11-30 LAB — WHOLE BLOOD GLUCOSE POCT
Whole Blood Glucose POCT: 113 mg/dL — ABNORMAL HIGH (ref 70–100)
Whole Blood Glucose POCT: 166 mg/dL — ABNORMAL HIGH (ref 70–100)
Whole Blood Glucose POCT: 100 mg/dL (ref 70–100)
Whole Blood Glucose POCT: 196 mg/dL — ABNORMAL HIGH (ref 70–100)

## 2022-11-30 MED ORDER — SODIUM POLYSTYRENE SULFONATE 15 GM/60ML PO SUSP
30.0000 g | Freq: Once | ORAL | Status: AC
Start: 2022-11-30 — End: 2022-11-30
  Administered 2022-11-30: 30 g via ORAL
  Filled 2022-11-30: qty 120

## 2022-11-30 MED ORDER — BUMETANIDE 0.25 MG/ML IJ SOLN
2.0000 mg | Freq: Every day | INTRAMUSCULAR | Status: DC
Start: 2022-11-30 — End: 2022-12-01
  Administered 2022-11-30 – 2022-12-01 (×2): 2 mg via INTRAVENOUS
  Filled 2022-11-30 (×2): qty 8

## 2022-11-30 NOTE — Plan of Care (Signed)
Problem: Moderate/High Fall Risk Score >5  Goal: Patient will remain free of falls  Flowsheets  Taken 11/30/2022 0000 by Sharyon Cable, RN  High (Greater than 13):   HIGH-Consider use of low bed   HIGH-Initiate use of floor mats as appropriate   HIGH-Pharmacy to initiate evaluation and intervention per protocol  Taken 11/28/2022 0800 by Canseco, Angelica, RN  Moderate Risk (6-13):   MOD-Consider activation of bed alarm if appropriate   MOD-Apply bed exit alarm if patient is confused   MOD-Floor mat at bedside (where available) if appropriate   MOD-Consider a move closer to [place]   MOD-Remain with patient during toileting   MOD-Place bedside commode and assistive devices out of sight when not in use   MOD-Re-orient confused patients   MOD-Utilize diversion activities   MOD-Perform dangle, stand, walk (DSW) prior to mobilization   MOD-Request PT/OT consult order for patients with gait/mobility impairment   MOD-Use gait belt when appropriate  Taken 11/28/2022 0311 by Christella Hartigan, RN  VH Moderate Risk (6-13):   ALL REQUIRED LOW INTERVENTIONS   INITIATE YELLOW "FALL RISK" SIGNAGE   YELLOW NON-SKID SLIPPERS   Remain with patient during toileting  VH High Risk (Greater than 13):   ALL REQUIRED LOW INTERVENTIONS   ALL REQUIRED MODERATE INTERVENTIONS     Problem: Hemodynamic Status: Cardiac  Goal: Stable vital signs and fluid balance  Flowsheets (Taken 11/29/2022 0351 by Si Raider, RN)  Stable vital signs and fluid balance:   Monitor lab values   Assess signs and symptoms associated with cardiac rhythm changes     Problem: Inadequate Tissue Perfusion  Goal: Adequate tissue perfusion will be maintained  Flowsheets (Taken 11/28/2022 0311 by Christella Hartigan, RN)  Adequate tissue perfusion will be maintained:   Monitor/assess lab values and report abnormal values   Monitor/assess for signs of VTE (edema of calf/thigh redness, pain)   Encourage/assist patient as needed to turn, cough, and perform deep breathing  every 2 hours   Monitor for signs and symptoms of a pulmonary embolism (dyspnea, tachypnea, tachycardia, confusion)   Monitor/assess neurovascular status (pulses, capillary refill, pain, paresthesia, paralysis, presence of edema)     Problem: Ineffective Gas Exchange  Goal: Effective breathing pattern  Flowsheets (Taken 11/28/2022 0311 by Christella Hartigan, RN)  Effective breathing pattern:   Maintain CO2 level per LIP order   Teach/reinforce use of ordered respiratory interventions (ie. CPAP, BiPAP, Incentive Spirometer, Acapella)   Monitor end tidal CO2 level per LIP order     Problem: Fluid and Electrolyte Imbalance/ Endocrine  Goal: Fluid and electrolyte balance are achieved/maintained  Flowsheets (Taken 11/28/2022 0311 by Christella Hartigan, RN)  Fluid and electrolyte balance are achieved/maintained:   Monitor/assess lab values and report abnormal values   Assess and reassess fluid and electrolyte status   Observe for cardiac arrhythmias   Monitor for muscle weakness  Goal: Adequate hydration  Flowsheets (Taken 11/28/2022 0311 by Christella Hartigan, RN)  Adequate hydration:   Assess mucus membranes, skin color, turgor, perfusion and presence of edema   Monitor and assess vital signs and perfusion   Assess for peripheral, sacral, periorbital and abdominal edema     Problem: Diabetes: Glucose Imbalance  Goal: Blood glucose stable at established goal  Flowsheets (Taken 11/28/2022 0311 by Christella Hartigan, RN)  Blood glucose stable at established goal:   Monitor lab values   Include patient/family in decisions related to nutrition/dietary selections   Coordinate medication administration with meals, as indicated   Ensure appropriate consults  are obtained (Nutrition, Diabetes Education, and Case Management)   Assess for hypoglycemia /hyperglycemia   Monitor intake and output.  Notify LIP if urine output is < 30 mL/hour.   Ensure appropriate diet and assess tolerance   Ensure adequate hydration   Monitor/assess vital signs    Follow fluid restrictions/IV/PO parameters   Ensure patient/family has adequate teaching materials     Problem: Safety  Goal: Patient will be free from injury during hospitalization  Flowsheets (Taken 11/28/2022 0311 by Christella Hartigan, RN)  Patient will be free from injury during hospitalization:   Assess patient's risk for falls and implement fall prevention plan of care per policy   Ensure appropriate safety devices are available at the bedside   Assess for patients risk for elopement and implement [place] per policy   Provide and maintain safe environment   Include patient/ family/ care giver in decisions related to safety   Provide alternative method of communication if needed (communication boards, writing)   Use appropriate transfer methods   Hourly rounding     Problem: Everyday - Heart Failure  Goal: Stable Vital Signs and Fluid Balance  Flowsheets (Taken 11/29/2022 0351 by Si Raider, RN)  Stable Vital Signs and Fluid Balance:   Strict Intake/Output   Assess for swelling/edema   Monitor, assess vital signs and telemetry per policy   Monitor labs and report abnormalities to physician   Daily Standing Weights in the morning using the same scale, after using the bathroom and before breadfast.  If unable to stand, zero the bed and use the bed scale     NURSING SHIFT NOTE     Patient: [name]  Day: 1      SHIFT EVENTS     Shift Narrative/Significant Events (PRN med administration, fall, RRT, etc.):     A/O x 4, denies pain, able to placed IV to right Ac by ICU RN, IV Bumex given,  closely monitoring I&O, given kayexalate for K level 6.1,pt. ambulating in the hallway, will continued to monitor.    Safety and fall precautions remain in place. Purposeful rounding completed.          ASSESSMENT     Changes in assessment from patient's baseline this shift:    Neuro: No  CV: No  Pulm: No  Peripheral Vascular: No  HEENT: No  GI: No  BM during shift: No, Last BM: Last BM Date: 11/26/22  GU:  No   Integ: No  MS: No    Pain: None      Mobility: PMP Activity: Step 7 - Walks out of Room of Distance Walked (ft) (Step 6,7): 500 Feet           Lines     Patient Lines/Drains/Airways Status       Active Lines, Drains and Airways       Name Placement date Placement time Site Days    Peripheral IV 11/30/22 20 G Standard Anterior;Distal;Right Upper Arm 11/30/22  1130  Upper Arm  less than 1                         VITAL SIGNS     Vitals:    11/30/22 1207   BP: 130/69   Pulse: 80   Resp: 18   Temp: 98.2 F (36.8 C)   SpO2: 100%       Temp  Min: 98.1 F (36.7 C)  Max: 98.6 F (37 C)  Pulse  Min: 73  Max: 87  Resp  Min: 15  Max: 18  BP  Min: 92/55  Max: 160/73  SpO2  Min: 98 %  Max: 100 %      Intake/Output Summary (Last 24 hours) at 11/30/2022 1526  Last data filed at 11/30/2022 0000  Gross per 24 hour   Intake 110 ml   Output --   Net 110 ml              OXYGEN WEANING           CARE PLAN

## 2022-11-30 NOTE — Progress Notes (Signed)
[facility] PROGRESS NOTE  [id number]      Date Time: 11/30/22 11:16 AM  Patient Name: [name]  Attending Physician: Lisa Roca, MD    CC: follow-up AKI/CKD    Assessment:     AKI - ATN from hypoxia?  Cr up further.  Kayex x 1 now.  No urgent need for HD but if Cr up further in a.m., may need.  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  Hyperkalemia - cont low K diet, kayex as above.  Resume bumex.  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       Recommendations:     Cont renal diet  Kayex x 1 again today  No urgent need for HD but may need in next 24hrs if not improvement  Cont to hold ARB  Cont ESA  Resume bumex now      Case discussed with: pt and family      Laurice Record, MD  [facility]  [id number] (office)      Subjective:   Feels swollen overall, no sob    Review of Systems:   No cp    Physical Exam:     Vitals:    11/30/22 0333 11/30/22 0500 11/30/22 0700 11/30/22 0826   BP: 92/55   125/75   Pulse: 74  73 74   Resp: 17   16   Temp: 98.4 F (36.9 C)   98.1 F (36.7 C)   TempSrc: Oral   Oral   SpO2: 98%   98%   Weight:  137.4 kg (302 lb 14.6 oz)  141.5 kg (312 lb)       Intake and Output Summary (Last 24 hours) at Date Time    Intake/Output Summary (Last 24 hours) at 11/30/2022 1116  Last data filed at 11/30/2022 0000  Gross per 24 hour   Intake 110 ml   Output 1000 ml   Net -890 ml         General: awake, alert, no acute distress  Cardiovascular: regular rate and rhythm, no murmurs, rubs or gallops  Lungs: clear to auscultation bilaterally, without wheezing, rhonchi, or rales  Abdomen: soft, non-tender, non-distended, normoactive bowel sounds  Extremities: trace B LE edema, 1+ flank and UE edema      Meds:       Scheduled Meds: PRN Meds:    apixaban, 5 mg, Oral, Q12H SCH  ARIPiprazole, 10 mg, Oral, Daily  aspirin, 81 mg, Oral, Daily  atorvastatin, 80 mg, Oral, QHS  budesonide-formoterol, 2 puff, Inhalation, BID  carvedilol, 25 mg, Oral, Q12H SCH  cefTRIAXone, 1 g, Intravenous, Q24H  docusate sodium, 100 mg, Oral, QHS  DULoxetine, 20 mg, Oral, Daily  famotidine, 20 mg, Oral, Daily  folic acid, 1 mg, Oral, Daily  insulin glargine, 30 Units, Subcutaneous, Q12H  insulin lispro, 1-4 Units, Subcutaneous, QHS  insulin lispro, 1-8 Units, Subcutaneous, TID AC  montelukast, 10 mg, Oral, QHS  pregabalin, 150 mg, Oral, BID  sertraline, 50 mg, Oral, Daily  sodium bicarbonate, 1,300 mg, Oral, TID  traZODone, 50 mg, Oral, QHS  Continuous Infusions:   acetaminophen, 650 mg, Q6H PRN  albuterol, 2.5 mg, Q4H PRN  benzocaine-menthol, 1 lozenge, Q2H PRN  benzonatate, 100 mg, TID PRN  artificial tears (REFRESH PLUS), 1 drop, TID PRN  dextrose, 15 g of glucose, PRN   Or  dextrose, 12.5 g, PRN   Or  dextrose, 12.5 g, PRN   Or  glucagon (rDNA), 1 mg, PRN  hydrALAZINE, 5 mg, Q6H PRN  melatonin, 3 mg, QHS PRN  naloxone, 0.2 mg, PRN  ondansetron, 4 mg, Q4H PRN   Or  ondansetron, 4 mg, Q4H PRN  oxyCODONE, 10 mg, Q6H PRN  polyethylene glycol, 17 g, Daily PRN  saline, 2 spray, Q4H PRN              Labs:     Recent Labs   Lab 11/29/22  0449 11/28/22  0454 11/27/22  1956   WBC 11.83* 12.49* 15.48*   Hgb 8.8* 8.2* 9.1*   Hematocrit 29.9* 28.6* 31.4*   Platelets 339 292 383*       Recent Labs   Lab 11/29/22  1457 11/29/22  0449 11/28/22  0940 11/28/22  0454 11/27/22  1956   Sodium  --  136 138 132* 136   Potassium 5.5* 5.8* 5.2 5.5* 5.5*   Chloride  --  111 114* 113* 113*   CO2  --  19 17 16* 17   BUN  --  44.0* 36.0* 35.0* 32.0*   Creatinine  --  3.5* 2.6* 2.5* 2.1*   Calcium  --  7.5* 7.5* 7.4* 7.9*   Albumin  --  2.3*  --   --  2.5*   Phosphorus  --   --   --  5.0*  --    Glucose  --  162* 116* 349* 271*   eGFR  --  15.9* 22.7* 23.8* 29.4*          Recent Labs   Lab 11/27/22  2215   Urine Type Urine, Clean Ca   Color, UA Yellow   Clarity, UA Clear   Specific Gravity UA 1.025   Urine pH 6.0   Nitrite, UA Negative   Ketones UA Negative   Urobilinogen, UA 0.2   Bilirubin, UA Negative   Blood, UA Trace-intact*   RBC, UA 6-10*   WBC, UA 6-10*             Imaging personally reviewed, including: No results found.        Signed by: Laurice Record, MD

## 2022-11-30 NOTE — Progress Notes (Addendum)
[place] NOTE  Otilio Miu, MD   [facility]  Office phone:  [phone]     [facility] phone [phone]  [facility] phone [phone]    Date:  11/30/2022  8:21 AM    Patient:  [name].  DOB: Feb 13, 1981.  42 y.o.  female , MRN: [id number]   Attending:  Lisa Roca, MD   Admission date:  11/27/2022     ASSESSMENT & PLAN:   Impression:  Atypical CP, most likely pulm, vs. Uncontrolled HTN related CP (not taking her medications) without ischemic EKG findings or elevation in hsTn -- all reassuring  -- control BP, treat medically at this time, ASA, statin, BP control  -- primary team planning to rule out PE (v/q low prob)     2. HTN, much improved back on meds -- BP is 150/80 mmHg; pt on coreg 25 mg BID; her ARB was held due to elevated K; can consider restarting when K is normal     3. Uncontrolled DM history -- on high dose statin, Lipitor 80 mg QD; A1C 6.6 this admission (was >14 in 2021)     4. Morbid obesity -- exercise, weight loss     5. History of PE -- low prob V/Q scan; back on Eliquis     6. CKD with Cr of 2.5; has been as high as 3.2; now worsening to 3.5 and now 4.0 with hyperkalemia again (K of 6.1)     7. Hyperkalemia -- 5.8 to 6.1 despite kayexelate; her ARB is on hold; K remains elevated-- nephrology now seeing     8.  HFpEF -- appears well compensated; CXR clear, lungs clear on exam     9. Anemia, Hgb chronically 8-9, unclear etiology, ?CKD     10. 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 OSA     12. History of possible asthma        Recommendations:  As above; pt can f/u with her primary cardiologist in [place]; no inpatient plans at this time for further testing in light of normal hsTn and reassuring EKG and overall improvement; renal function needs to significantly improve and nephrology is on board at this time; stress testing will not  change current management; pt's renal function is actually worse    Check EKG and keep patient active on telemetry given rise in K  High dose statin  Baby ASA  BP control  Weight loss plan with PCP  Exercise for now as tolerated  Further plans per medicine and nephrology    We will sign off. Please contact us for further input as needed.  D/w Dr. Loree Fee.     HISTORY OF PRESENT ILLNESS:   K is high and Cr is worsening despite holding ARB and IVFs    MEDICATIONS:    INFUSION MEDS:      SCHEDULED MEDS:     Current Facility-Administered Medications   Medication Dose Route Frequency    apixaban  5 mg Oral Q12H SCH    ARIPiprazole  10 mg Oral Daily    aspirin  81 mg Oral Daily    atorvastatin  80 mg Oral QHS    budesonide-formoterol  2 puff Inhalation BID    carvedilol  25 mg Oral Q12H SCH    cefTRIAXone  1 g Intravenous Q24H    docusate sodium  100 mg  Oral QHS    DULoxetine  20 mg Oral Daily    famotidine  20 mg Oral Daily    folic acid  1 mg Oral Daily    insulin glargine  30 Units Subcutaneous Q12H    insulin lispro  1-4 Units Subcutaneous QHS    insulin lispro  1-8 Units Subcutaneous TID AC    montelukast  10 mg Oral QHS    pregabalin  150 mg Oral BID    sertraline  50 mg Oral Daily    sodium bicarbonate  1,300 mg Oral TID    traZODone  50 mg Oral QHS      PRN MEDS:   acetaminophen, albuterol, benzocaine-menthol, benzonatate, artificial tears (REFRESH PLUS), dextrose **OR** dextrose **OR** dextrose **OR** glucagon (rDNA), hydrALAZINE, melatonin, naloxone, ondansetron **OR** ondansetron, oxyCODONE, polyethylene glycol, saline    DIAGNOSTICS:         LABS:   Labs reviewed:  Recent Labs   Lab 11/29/22  0449 11/28/22  0454 11/27/22  1956   WBC 11.83* 12.49* 15.48*   Hgb 8.8* 8.2* 9.1*   Hematocrit 29.9* 28.6* 31.4*   Platelets 339 292 383*     Recent Labs   Lab 11/29/22  1457 11/29/22  0449 11/28/22  0940 11/28/22  0454   Sodium  --  136 138 132*   Potassium 5.5* 5.8* 5.2 5.5*   Chloride  --  111 114* 113*   CO2  --  19  17 16*   BUN  --  44.0* 36.0* 35.0*   Creatinine  --  3.5* 2.6* 2.5*   eGFR  --  15.9* 22.7* 23.8*   Glucose  --  162* 116* 349*   Calcium  --  7.5* 7.5* 7.4*     Recent Labs   Lab 11/27/22  2152 11/27/22  1956   hs Troponin-I 10.7 8.9   hs Troponin-I Delta 1.8  --      Recent Labs   Lab 11/27/22  1956   NT-proBNP 1,368*         FLUID BALANCE AND WEIGHT:                                           Last filed weight: Weight: 137.4 kg (302 lb 14.6 oz)  Weight on Admission: Weight: 137.4 kg (302 lb 14.6 oz)  Net IO Since Admission: 1,228 mL [11/30/22 0821]     Intake/Output Summary (Last 24 hours) at 11/30/2022 0700  Last data filed at 11/30/2022 0000  Gross per 24 hour   Intake 228 ml   Output 1000 ml   Net -772 ml   Weight change: 0 kg (0 lb)    Last 3 Weights for the past 72 hrs (Last 3 readings):   Weight   11/30/22 0500 137.4 kg (302 lb 14.6 oz)   11/29/22 0500 140 kg (308 lb 10.3 oz)   11/28/22 0754 137.4 kg (302 lb 14.6 oz)        PHYSICAL EXAM:                                              BP 92/55   Pulse 73   Temp 98.4 F (36.9 C) (Oral)   Resp 17   Wt 137.4 kg (  302 lb 14.6 oz)   SpO2 98%   BMI 48.89 kg/m     No distress, was sleeping on entry into the room; no snoring witnessed today but have witnessed on prior visits  Rrr, S1, S2, no sign murmurs  Abd soft and nt  No le edema  Neuro intact    Tele: SR noted, indep visualized

## 2022-11-30 NOTE — Plan of Care (Signed)
NURSING SHIFT NOTE     Patient: [name]  Day: 1      SHIFT EVENTS     Shift Narrative/Significant Events (PRN med administration, fall, RRT, etc.):       Safety and fall precautions remain in place. Purposeful rounding completed.          ASSESSMENT     Changes in assessment from patient's baseline this shift:    Neuro: No  CV: No  Pulm: No  Peripheral Vascular: No  HEENT: No  GI: No  BM during shift: No, Last BM: Last BM Date: 11/26/22  GU: No   Integ: No  MS: No    Pain: Improved  Pain Interventions: Medications and Rest  Medications Utilized:     Mobility: PMP Activity: Step 6 - Walks in Room of Distance Walked (ft) (Step 6,7): 360 Feet           Lines     Patient Lines/Drains/Airways Status       Active Lines, Drains and Airways       Name Placement date Placement time Site Days    Peripheral IV 11/29/22 20 G Anterior;Left;Proximal Forearm 11/29/22  1504  Forearm  less than 1                         VITAL SIGNS     Vitals:    11/29/22 2249   BP: 149/80   Pulse: 84   Resp: 15   Temp: 98.6 F (37 C)   SpO2: 100%       Temp  Min: 98.2 F (36.8 C)  Max: 98.6 F (37 C)  Pulse  Min: 78  Max: 87  Resp  Min: 15  Max: 19  BP  Min: 141/86  Max: 160/73  SpO2  Min: 99 %  Max: 100 %      Intake/Output Summary (Last 24 hours) at 11/30/2022 0048  Last data filed at 11/30/2022 0000  Gross per 24 hour   Intake 228 ml   Output 1050 ml   Net -822 ml            CARE PLAN     Problem: Hemodynamic Status: Cardiac  Goal: Stable vital signs and fluid balance  Outcome: Progressing     Problem: Inadequate Tissue Perfusion  Goal: Adequate tissue perfusion will be maintained  Outcome: Progressing     Problem: Ineffective Gas Exchange  Goal: Effective breathing pattern  Outcome: Progressing     Problem: Fluid and Electrolyte Imbalance/ Endocrine  Goal: Fluid and electrolyte balance are achieved/maintained  Outcome: Progressing  Goal: Adequate hydration  Outcome: Progressing     Problem: Diabetes: Glucose Imbalance  Goal: Blood  glucose stable at established goal  Outcome: Progressing     Problem: Nutrition  Goal: Nutritional intake is adequate  Outcome: Progressing  Goal: Patient maintains weight  Outcome: Progressing  Goal: Food and/or nutrient delivery  Outcome: Progressing     Problem: Safety  Goal: Patient will be free from injury during hospitalization  Outcome: Progressing  Goal: Patient will be free from infection during hospitalization  Outcome: Progressing     Problem: Side Effects from Pain Analgesia  Goal: Patient will experience minimal side effects of analgesic therapy  Outcome: Progressing     Problem: Pain interferes with ability to perform ADL  Goal: Pain at adequate level as identified by patient  Outcome: Progressing     Problem: Compromised Activity/Mobility  Goal: Activity/Mobility  Interventions  Outcome: Progressing     Problem: Everyday - Heart Failure  Goal: Stable Vital Signs and Fluid Balance  Outcome: Progressing  Goal: Mobility/Activity is Maintained at Optimal Level for Patient  Outcome: Progressing  Goal: Nutritional Intake is Adequate  Outcome: Progressing     Problem: Day of Discharge - Heart Failure  Goal: Discharge Education  Outcome: Progressing

## 2022-11-30 NOTE — Progress Notes (Signed)
[facility]  PROGRESS NOTE      Patient: [name]  Date: 11/30/2022   LOS: 1 Days  Admission Date: 11/27/2022   MRN: [id number]  Attending: Gwyndolyn Kaufman, MD  When on service as the attending, please contact me on Epic Secure Chat from 7 AM - 7 PM for non-urgent issues. For urgent matters use XTend page from 7 AM - 7 PM.       ASSESSMENT/PLAN     [name] is a 42 y.o. female admitted with Asthma exacerbation attacks    Interval Summary:     Patient Active Hospital Problem List:  Asthma exacerbation attacks (11/27/2022)     Continue DuoNebs  Continue with Singulair  Clinically improved therefore no need for steroids at present time    Chest pains with dyspnea  VQ scan negative for PE  Troponins are flat.    Rule out acute bronchitis  Continue IV ceftriaxone.    Diabetes mellitus  Continue with Lantus and sliding scale coverage.  Monitor blood sugar and adjust medications    Hyperkalemia  Likely due to metabolic acidosis/AKI  Potassium elevated again today  Patient seen by dietitian   Kayexalate and Bumex again today    CKD with AKI  Patient seen by Dr. Lendell Caprice and Dr. Gentry Roch nephrology much appreciated  Avoid nephrotoxic agents  Will resume Bumex today.  Will consider dialysis based on clinical course    DVT PE  No evidence of PE on VQ scan this admission  Continue anticoagulation with Eliquis 5 mg p.o. twice daily every 12 hours      Anemia due to CKD  Monitor serial CBCs    Obesity  Diet exercise weight loss and lifestyle modification    Plan discussed with patient and bedside RN               Nutrition: Consistent carbohydrate cardiac diet          Patient has BMI=Body mass index is 50.36 kg/m.  Diagnosis: Obesity based on BMI criteria          Recent Labs     11/30/22  1103 11/29/22  0449 11/28/22  0940 11/28/22  0454 11/27/22  1956   Sodium 136 136 138 132* 136       Diagnosis: Mild Hyponatremia            Recent Labs     11/30/22  1103 11/29/22  1457 11/29/22  0449  11/28/22  0940 11/28/22  0454 11/27/22  1956   Potassium 6.1* 5.5* 5.8* 5.2 5.5* 5.5*       Diagnosis: Hyperkalemia        Code Status: Full Code    Dispo: To be determined    Family Contact: Patient's daughter    DVT Prophylaxis:   Current Facility-Administered Medications (Includes Only Anticoagulants, Misc. Hematological)   Medication Dose Route Last Admin    apixaban (ELIQUIS) tablet 5 mg  5 mg Oral 5 mg at 11/30/22 0908          CHART  REVIEW & DISCUSSION     The following chart items were reviewed as of 12:53 PM on 11/30/22:  [x]  Lab Results [x]  Imaging Results   []  Problem List  [x]  Current Orders [x]  Current Medications  []  Allergies  [x]  Code Status [x]  Previous Notes   []  SDoH    The management and plan of care for this patient was discussed with the following specialty consultants:  [x]   Cardiology  []  Gastroenterology                 []  Infectious Disease  []  Pulmonology []  Neurology                [x]  Nephrology  []  Neurosurgery []  Orthopedic Surgery  []  Heme/Onc  []  General Surgery []  Psychiatry                                   []  Palliative    SUBJECTIVE     [name] she is feeling better denies chest pains or dyspnea today    MEDICATIONS     Current Facility-Administered Medications   Medication Dose Route Frequency    apixaban  5 mg Oral Q12H [place]    ARIPiprazole  10 mg Oral Daily    aspirin  81 mg Oral Daily    atorvastatin  80 mg Oral QHS    budesonide-formoterol  2 puff Inhalation BID    bumetanide  2 mg Intravenous Daily    carvedilol  25 mg Oral Q12H [place]    cefTRIAXone  1 g Intravenous Q24H    docusate sodium  100 mg Oral QHS    DULoxetine  20 mg Oral Daily    famotidine  20 mg Oral Daily    folic acid  1 mg Oral Daily    insulin glargine  30 Units Subcutaneous Q12H    insulin lispro  1-4 Units Subcutaneous QHS    insulin lispro  1-8 Units Subcutaneous TID AC    montelukast  10 mg Oral QHS    pregabalin  150 mg Oral BID    sertraline  50 mg Oral Daily    sodium bicarbonate  1,300 mg  Oral TID    sodium polystyrene  30 g Oral Once    traZODone  50 mg Oral QHS       PHYSICAL EXAM     Vitals:    11/30/22 1207   BP: 130/69   Pulse: 80   Resp: 18   Temp: 98.2 F (36.8 C)   SpO2: 100%       Temperature: Temp  Min: 98.1 F (36.7 C)  Max: 98.6 F (37 C)  Pulse: Pulse  Min: 73  Max: 87  Respiratory: Resp  Min: 15  Max: 18  Non-Invasive BP: BP  Min: 92/55  Max: 160/73  Pulse Oximetry SpO2  Min: 98 %  Max: 100 %    Intake and Output Summary (Last 24 hours) at Date Time    Intake/Output Summary (Last 24 hours) at 11/30/2022 1253  Last data filed at 11/30/2022 0000  Gross per 24 hour   Intake 110 ml   Output 1000 ml   Net -890 ml           GEN APPEARANCE: This lady in mild respiratory distress A&OX3  HEENT: PERLA; EOMI; Conjunctiva Clear  NECK: Supple; No bruits  CVS: RRR, S1, S2; No M/G/R  LUNGS: Basilar rales  ABD: Soft; No TTP; + Normoactive BS  EXT: No edema; Pulses 2+ and intact  NEURO: CN 2-12 intact; No Focal neurological deficits  MENTAL STATUS: Awake alert and oriented x 3    LABS     Recent Labs   Lab 11/30/22  1103 11/29/22  0449 11/28/22  0454   WBC 10.71* 11.83* 12.49*   RBC 3.02* 3.41* 3.19*   Hgb 7.8* 8.8*  8.2*   Hematocrit 26.8* 29.9* 28.6*   MCV 88.7 87.7 89.7   Platelets 317 339 292         Recent Labs   Lab 11/30/22  1103 11/29/22  1457 11/29/22  0449 11/28/22  0940 11/28/22  0454 11/27/22  1956   Sodium 136  --  136 138 132* 136   Potassium 6.1* 5.5* 5.8* 5.2 5.5* 5.5*   Chloride 110  --  111 114* 113* 113*   CO2 19  --  19 17 16* 17   BUN 59.0*  --  44.0* 36.0* 35.0* 32.0*   Creatinine 4.0*  --  3.5* 2.6* 2.5* 2.1*   Glucose 168*  --  162* 116* 349* 271*   Calcium 7.1*  --  7.5* 7.5* 7.4* 7.9*         Recent Labs   Lab 11/30/22  1103 11/29/22  0449 11/27/22  1956   ALT 8 6 9    AST (SGOT) 13 5 11    Bilirubin, Total 0.3 0.1* 0.2   Albumin 2.3* 2.3* 2.5*   Alkaline Phosphatase 78 88 97         Recent Labs   Lab 11/27/22  2152 11/27/22  1956   hs Troponin-I 10.7 8.9   hs Troponin-I Delta  1.8  --                Microbiology Results (last 15 days)       Procedure Component Value Units Date/Time    MRSA culture [[id number]] Collected: 11/29/22 2113    Order Status: Sent Specimen: Culturette from Nasal Swab Updated: 11/30/22 0329    MRSA culture [[id number]] Collected: 11/29/22 2113    Order Status: Sent Specimen: Culturette from Throat Updated: 11/30/22 0329    Urine culture [[id number]] Collected: 11/28/22 0940    Order Status: Completed Specimen: Urine, Clean Catch Updated: 11/29/22 1617    Narrative:      Indications for Urine Culture:->Costovertebral Angle  Tenderness  ORDER#: [id number]                                    ORDERED BY: Vonzell Lindblad  SOURCE: Urine, Clean Catch                           COLLECTED:  11/28/22 09:40  ANTIBIOTICS AT COLL.:                                RECEIVED :  11/28/22 14:53  Culture Urine                              FINAL       11/29/22 16:17  11/29/22   1,000 - 9,000 CFU/ML of normal urogenital or skin microbiota, no further             work      Culture Blood Aerobic and Anaerobic [[id number]] Collected: 11/28/22 0806    Order Status: Completed Specimen: Blood, Venipuncture Updated: 11/30/22 1221    Narrative:      The order will result in two separate 8-68ml bottles  Please do NOT order repeat blood cultures if one has been  drawn within the last 48 hours  UNLESS concerned for  endocarditis  AVOID BLOOD CULTURE DRAWS  FROM CENTRAL LINE IF POSSIBLE  Indications:->Sepsis  ORDER#: [id number]                                    ORDERED BY: GOARD, JODY  SOURCE: Blood, Venipuncture                          COLLECTED:  11/28/22 08:06  ANTIBIOTICS AT COLL.:                                RECEIVED :  11/28/22 11:31  Culture Blood Aerobic and Anaerobic        PRELIM      11/30/22 12:21  11/29/22   No Growth after 1 day/s of incubation.  11/30/22   No Growth after 2 day/s of incubation.      Culture Blood Aerobic and Anaerobic [[id number]] Collected: 11/28/22 0806    Order Status:  Completed Specimen: Blood, Venipuncture Updated: 11/30/22 1221    Narrative:      The order will result in two separate 8-95ml bottles  Please do NOT order repeat blood cultures if one has been  drawn within the last 48 hours  UNLESS concerned for  endocarditis  AVOID BLOOD CULTURE DRAWS FROM CENTRAL LINE IF POSSIBLE  Indications:->Sepsis  ORDER#: [id number]                                    ORDERED BY: GOARD, JODY  SOURCE: Blood, Venipuncture                          COLLECTED:  11/28/22 08:06  ANTIBIOTICS AT COLL.:                                RECEIVED :  11/28/22 11:31  Culture Blood Aerobic and Anaerobic        PRELIM      11/30/22 12:21  11/29/22   No Growth after 1 day/s of incubation.  11/30/22   No Growth after 2 day/s of incubation.      COVID-19 (SARS-CoV-2) and Influenza A/B, NAA (Liat Rapid) [[id number]] Collected: 11/27/22 1956    Order Status: Completed Specimen: Culturette from Nasopharyngeal Updated: 11/27/22 2026     Purpose of COVID testing Diagnostic -[facility]     SARS-CoV-2 Specimen Source Nasal Swab     SARS CoV 2 Overall Result Not Detected     Comment: __________________________________________________  -A result of "Detected" indicates POSITIVE for the    presence of SARS CoV-2 RNA  -A result of "Not Detected" indicates NEGATIVE for the    presence of SARS CoV-2 RNA  __________________________________________________________  Test performed using the Roche cobas Liat SARS-CoV-2 assay. This assay is  only for use under the Food and Drug Administration's Emergency Use  Authorization. This is a real-time RT-PCR assay for the qualitative  detection of SARS-CoV-2 RNA. Viral nucleic acids may persist in vivo,  independent of viability. Detection of viral nucleic acid does not imply the  presence of infectious virus, or that virus nucleic acid is the cause of  clinical symptoms. Negative results do not preclude SARS-CoV-2 infection and  should not be used as  the sole basis for diagnosis, treatment or  other  patient management decisions. Negative results must be combined with  clinical observations, patient history, and/or epidemiological information.  Invalid results may be due to inhibiting substances in the specimen and  recollection should occur. Please see Fact Sheets for patients and providers  located:  https://www.[employer]-ch[employer].com/          Influenza A Not Detected     Influenza B Not Detected     Comment: Test performed using the Roche cobas Liat SARS-CoV-2 & Influenza A/B assay.  This assay is only for use under the Food and Drug Administration's  Emergency Use Authorization. This is a multiplex real-time RT-PCR assay  intended for the simultaneous in vitro qualitative detection and  differentiation of SARS-CoV-2, influenza A, and influenza B virus RNA. Viral  nucleic acids may persist in vivo, independent of viability. Detection of  viral nucleic acid does not imply the presence of infectious virus, or that  virus nucleic acid is the cause of clinical symptoms. Negative results do  not preclude SARS-CoV-2, influenza A, and/or influenza B infection and  should not be used as the sole basis for diagnosis, treatment or other  patient management decisions. Negative results must be combined with  clinical observations, patient history, and/or epidemiological information.  Invalid results may be due to inhibiting substances in the specimen and  recollection should occur. Please see Fact Sheets for patients and providers  located: http://[facility].info/.         Narrative:      o Collect and clearly label specimen type:  o PREFERRED-Upper respiratory specimen: One Nasal Swab in  Transport Media.  o Hand deliver to laboratory ASAP  Diagnostic -[facility]             RADIOLOGY     NM Lung Scan Perfusion w/Spect CT    Result Date: 11/28/2022   Normal perfusion lung scan. Ruby Cola, MD 11/28/2022 2:37 PM    Chest 2 Views    Result Date: 11/27/2022  1. No acute disease. Loel Ro,  MD 11/27/2022 8:18 PM    [place] VenoDopp Low Extremity Left    Result Date: 11/20/2022   No evidence of left lower extremity DVT. Darnelle Bos, MD 11/20/2022 12:36 PM   Echo Results       None          No results found for this or any previous visit.    Signed,  Lisa Roca, MD  12:53 PM 11/30/2022

## 2022-12-01 LAB — CBC AND DIFFERENTIAL
Absolute NRBC: 0 10*3/uL (ref 0.00–0.00)
Basophils Absolute Automated: 0.04 10*3/uL (ref 0.00–0.08)
Basophils Automated: 0.4 %
Eosinophils Absolute Automated: 0.35 10*3/uL (ref 0.00–0.44)
Eosinophils Automated: 3.1 %
Hematocrit: 25.7 % — ABNORMAL LOW (ref 34.7–43.7)
Hgb: 7.5 g/dL — ABNORMAL LOW (ref 11.4–14.8)
Immature Granulocytes Absolute: 0.06 10*3/uL (ref 0.00–0.07)
Immature Granulocytes: 0.5 %
Instrument Absolute Neutrophil Count: 6.68 10*3/uL — ABNORMAL HIGH (ref 1.10–6.33)
Lymphocytes Absolute Automated: 3.16 10*3/uL (ref 0.42–3.22)
Lymphocytes Automated: 28.3 %
MCH: 25.8 pg (ref 25.1–33.5)
MCHC: 29.2 g/dL — ABNORMAL LOW (ref 31.5–35.8)
MCV: 88.3 fL (ref 78.0–96.0)
MPV: 10.7 fL (ref 8.9–12.5)
Monocytes Absolute Automated: 0.88 10*3/uL — ABNORMAL HIGH (ref 0.21–0.85)
Monocytes: 7.9 %
Neutrophils Absolute: 6.68 10*3/uL — ABNORMAL HIGH (ref 1.10–6.33)
Neutrophils: 59.8 %
Nucleated RBC: 0 /100 WBC (ref 0.0–0.0)
Platelets: 307 10*3/uL (ref 142–346)
RBC: 2.91 10*6/uL — ABNORMAL LOW (ref 3.90–5.10)
RDW: 15 % (ref 11–15)
WBC: 11.17 10*3/uL — ABNORMAL HIGH (ref 3.10–9.50)

## 2022-12-01 LAB — WHOLE BLOOD GLUCOSE POCT
Whole Blood Glucose POCT: 64 mg/dL — ABNORMAL LOW (ref 70–100)
Whole Blood Glucose POCT: 147 mg/dL — ABNORMAL HIGH (ref 70–100)
Whole Blood Glucose POCT: 153 mg/dL — ABNORMAL HIGH (ref 70–100)
Whole Blood Glucose POCT: 195 mg/dL — ABNORMAL HIGH (ref 70–100)

## 2022-12-01 LAB — COMPREHENSIVE METABOLIC PANEL
ALT: 8 U/L (ref 0–55)
AST (SGOT): 7 U/L (ref 5–41)
Albumin/Globulin Ratio: 0.8 — ABNORMAL LOW (ref 0.9–2.2)
Albumin: 2.3 g/dL — ABNORMAL LOW (ref 3.5–5.0)
Alkaline Phosphatase: 78 U/L (ref 37–117)
Anion Gap: 8 (ref 5.0–15.0)
BUN: 66 mg/dL — ABNORMAL HIGH (ref 7.0–21.0)
Bilirubin, Total: 0.2 mg/dL (ref 0.2–1.2)
CO2: 21 mEq/L (ref 17–29)
Calcium: 7.1 mg/dL — ABNORMAL LOW (ref 8.5–10.5)
Chloride: 111 mEq/L (ref 99–111)
Creatinine: 3.6 mg/dL — ABNORMAL HIGH (ref 0.4–1.0)
Globulin: 2.8 g/dL (ref 2.0–3.6)
Glucose: 102 mg/dL — ABNORMAL HIGH (ref 70–100)
Potassium: 4.6 mEq/L (ref 3.5–5.3)
Protein, Total: 5.1 g/dL — ABNORMAL LOW (ref 6.0–8.3)
Sodium: 140 mEq/L (ref 135–145)
eGFR: 15.4 mL/min/{1.73_m2} — AB (ref >=60)

## 2022-12-01 LAB — URINE PROTEIN/CREATININE RATIO
Urine Creatinine, Random: 55.1 mg/dL
Urine Protein Random: 170 mg/dL — ABNORMAL HIGH (ref 1.0–14.0)
Urine Protein/Creatinine Ratio: 3.1

## 2022-12-01 MED ORDER — SIMETHICONE 80 MG PO CHEW
80.0000 mg | CHEWABLE_TABLET | Freq: Four times a day (QID) | ORAL | Status: DC | PRN
Start: 2022-12-01 — End: 2022-12-02
  Administered 2022-12-01: 80 mg via ORAL
  Filled 2022-12-01: qty 1

## 2022-12-01 MED ORDER — INSULIN GLARGINE 100 UNIT/ML SC SOLN
20.0000 [IU] | Freq: Two times a day (BID) | SUBCUTANEOUS | Status: DC
Start: 2022-12-01 — End: 2022-12-02
  Administered 2022-12-01 – 2022-12-02 (×3): 20 [IU] via SUBCUTANEOUS
  Filled 2022-12-01 (×3): qty 20

## 2022-12-01 MED ORDER — CALCIUM CARBONATE ANTACID 500 MG PO CHEW
500.0000 mg | CHEWABLE_TABLET | Freq: Four times a day (QID) | ORAL | Status: DC | PRN
Start: 2022-12-01 — End: 2022-12-02

## 2022-12-01 MED ORDER — DARBEPOETIN ALFA 60 MCG/0.3ML IJ SOSY
0.4500 ug/kg | PREFILLED_SYRINGE | INTRAMUSCULAR | Status: DC
Start: 2022-12-01 — End: 2022-12-02
  Administered 2022-12-01: 60 ug via SUBCUTANEOUS
  Filled 2022-12-01: qty 0.3

## 2022-12-01 MED ORDER — BUMETANIDE 1 MG PO TABS
2.0000 mg | ORAL_TABLET | Freq: Two times a day (BID) | ORAL | Status: DC
Start: 2022-12-01 — End: 2022-12-02
  Administered 2022-12-01 – 2022-12-02 (×3): 2 mg via ORAL
  Filled 2022-12-01 (×3): qty 2

## 2022-12-01 NOTE — Plan of Care (Signed)
Pt is A&Ox4. Vital signs stable. On RA SpO2 >95%. Pt reported pain of 8/10 in bilateral legs; relieved by medication and rest. Reports dypsnea on exertion(ambulates in hallway) and increased bilateral lower extremity swelling. On daily Bumex. Miralax and Docustae given to facilitate bowel movement. Received kayexelate during the day for K=6.1 but only had one bowel movement. Hospitalist also notfiied to request for prn meds for bloating. Simethicone given once ordered. No significant event overnight. Repeat K=4.6 (am labs). Plan of care discussed with patient at start of shift; all concerns addressed. Safety bundle maintained. Purposeful hourly rounding completed.     Problem: Moderate/High Fall Risk Score >5  Goal: Patient will remain free of falls  Outcome: Progressing  Flowsheets (Taken 12/01/2022 0249)  Moderate Risk (6-13):   LOW-Fall Interventions Appropriate for Low Fall Risk   LOW-Anticoagulation education for injury risk     Problem: Hemodynamic Status: Cardiac  Goal: Stable vital signs and fluid balance  Outcome: Progressing  Flowsheets (Taken 12/01/2022 0249)  Stable vital signs and fluid balance:   Assess signs and symptoms associated with cardiac rhythm changes   Monitor lab values     Problem: Inadequate Tissue Perfusion  Goal: Adequate tissue perfusion will be maintained  Outcome: Progressing  Flowsheets (Taken 12/01/2022 0249)  Adequate tissue perfusion will be maintained:   Monitor/assess lab values and report abnormal values   Monitor/assess neurovascular status (pulses, capillary refill, pain, paresthesia, paralysis, presence of edema)   Monitor/assess for signs of VTE (edema of calf/thigh redness, pain)   Monitor for signs and symptoms of a pulmonary embolism (dyspnea, tachypnea, tachycardia, confusion)   Encourage/assist patient as needed to turn, cough, and perform deep breathing every 2 hours     Problem: Ineffective Gas Exchange  Goal: Effective breathing pattern  Outcome:  Progressing  Flowsheets (Taken 12/01/2022 0249)  Effective breathing pattern:   Maintain CO2 level per LIP order   Teach/reinforce use of ordered respiratory interventions (ie. CPAP, BiPAP, Incentive Spirometer, Acapella)     Problem: Fluid and Electrolyte Imbalance/ Endocrine  Goal: Fluid and electrolyte balance are achieved/maintained  Outcome: Progressing  Flowsheets (Taken 12/01/2022 0249)  Fluid and electrolyte balance are achieved/maintained:   Monitor/assess lab values and report abnormal values   Observe for cardiac arrhythmias   Monitor for muscle weakness   Assess and reassess fluid and electrolyte status     Problem: Diabetes: Glucose Imbalance  Goal: Blood glucose stable at established goal  Outcome: Progressing  Flowsheets (Taken 12/01/2022 0249)  Blood glucose stable at established goal:   Monitor lab values   Include patient/family in decisions related to nutrition/dietary selections   Monitor/assess vital signs   Ensure appropriate diet and assess tolerance   Ensure appropriate consults are obtained (Nutrition, Diabetes Education, and Case Management)   Ensure patient/family has adequate teaching materials   Coordinate medication administration with meals, as indicated   Ensure adequate hydration   Assess for hypoglycemia /hyperglycemia   Monitor intake and output.  Notify LIP if urine output is < 30 mL/hour.   Follow fluid restrictions/IV/PO parameters     Problem: Side Effects from Pain Analgesia  Goal: Patient will experience minimal side effects of analgesic therapy  Outcome: Progressing  Flowsheets (Taken 12/01/2022 0249)  Patient will experience minimal side effects of analgesic therapy:   Monitor/assess patient's respiratory status (RR depth, effort, breath sounds)   Prevent/manage side effects per LIP orders (i.e. nausea, vomiting, pruritus, constipation, urinary retention, etc.)   Evaluate for opioid-induced sedation with appropriate assessment tool (  i.e. POSS)   Assess for changes in cognitive  function

## 2022-12-01 NOTE — Consults (Signed)
Nutritional Support Services  Nutrition Assessment    [name] 42 y.o. female   MRN: [id number]    Summary of Nutrition Recommendations:  1. Continue current diet    2.. Monitor intakes    Document % meals consumed     3. Monitor weights    Standing scale if able     4. Recommend phosLo  -----------------------------------------------------------------------------------------------------------------  D/w RN                                                       Assessment Data:   Referral Source:Consult   Reason for Referral: change in diet for cardiac purposes    Nutrition:  Met with pt at bedside  Pt reports good intakes has been making healthier choices by eating more chicken and seafood with increased fruits and vegetable. Denied any N/V/C/D. last BM 1/22. Per flow sheet 5 meals documented at 75% or greater.  Phos elevated other labs stable.    Learning Needs: Provided patient with base education on Renal/ heart healthy diet. Recommended having labs checked regularly and adjusting intakes of phos/potassium foods when labs not WNL. Provided patient with several handouts on topic dicussed for reinforcement. Qalso recommended monitoring the sodium levels of some vegan/vegetarian pre packaged foods as can be high Na. Patient appreciative of the information, no questions at this time.     Hospital Admission: 42 y.o. female with a PMHx of DVT on apixaban, CKD 4, DMII on insulin, HFpEF, HTN, HLD, reports 1 week history of cough with small amount of clear sputum production, followed by intermittent chest heaviness, worse with activity, improves with rest. She presented to the ED earlier this evening and was reportedly wheezy on arrival and hypertensive. She was given nebs and IV hydralazine. Work up was significant for WBC 15K, hgb 9.1 (at baseline), glucose 271, creatinine 2.1 (at baseline), potassium 5.5, proBNP 1368. CXR was negative for acute process. Patient was given 40mg  IV lasix and hospitalist was  asked to admit her for observation.   Patient seen and examined. She denies chest pain at present. Only current complaint is cough.      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    PSH: has a past surgical history that includes Hysterectomy (12/2019); Cesarean section; Cholecystectomy; ABLATION ENDOMETRIAL; Eye surgery (Bilateral); APPENDECTOMY (OPEN); UTERINE RUPTURE; REPAIR, UPPER EXTREMITY, TENDON & NERVE (Right, 12/05/2021); Biopsy, Renal (N/A, 05/22/2022); REPAIR, UPPER EXTREMITY, TENDON & NERVE (Left, 07/10/2022); and RELEASE, CARPAL TUNNEL (Left, 07/10/2022).     Orders Placed This Encounter   Procedures    Diet Consistent Carbohydrate and Heart Healthy Additional restrictions: RENAL     Intake: Per flow sheet     11/28/22 1145 11/28/22 1606 11/29/22 1032   Intake (mL)   Percent Meal Consumed (%) 75% 100% 100%   Data Collected by Clinical Technician Clinical Technician  --       11/30/22 1207 11/30/22 1551 11/30/22 2000   Intake (mL)   Percent Meal Consumed (%) 100% 0% 100%   Data Collected by  --   --   --  ANTHROPOMETRIC     Weight: 143.3 kg (316 lb)     Weight Change: 1.28              Body mass index is 51 kg/m.    Weight Monitoring     Weight Weight Method   12/05/2021 130.9 kg  Standing Scale    12/17/2021 135.4 kg  Standing Scale    02/02/2022 135.4 kg  Standing Scale    02/03/2022     02/04/2022 135.8 kg  Standing Scale     135.626 kg     02/24/2022 135 kg  Stated    03/05/2022 130.636 kg  Stated    04/02/2022 129.275 kg  Stated    05/01/2022 126.3 kg  Actual    05/22/2022 127.914 kg  Stated    07/02/2022 132.904 kg      07/10/2022 133.2 kg  Actual    08/01/2022 134.265 kg     08/25/2022 132.904 kg  Stated    11/20/2022 135.127 kg  Actual    11/28/2022 137.4 kg  Standing Scale     137.4 kg  Standing Scale    11/29/2022 140 kg  Standing Scale    11/30/2022 137.4 kg  Standing Scale     141.522 kg  Standing Scale    12/01/2022 143.337 kg  Standing Scale           Weight History Summary: Weight gain over the past year, pt also with chronic BLE leg swelling.       ESTIMATED NEEDS    Total Daily Energy Needs: 1477.5 to 1773 kcal  Method for Calculating Energy Needs: 25 kcal - 30 kcal per kg  at 59.1 kg (Ideal body weight)  Rationale: M obese, non icu, CKD       Total Daily Protein Needs: 118.2 to 130.02 g  Method for Calculating Protein Needs: 2 g - 2.2 g per kg at 59.1 kg (Ideal body weight)  Rationale: M obese, non ICU, CKD      Total Daily Fluid Needs: 2068.5 to 2068.5 ml  Method for Calculating Fluid Needs: 35 ml - 35 ml  per kg at 59.1 kg (Ideal body weight)  Rationale: (age and BMI) or per MD      Pertinent Medications: Bumex, colace, pepcid, folic acid, lasix, lantus, lispro 1-4 untis, keyexalate     IVF:      Allergies   Allergen Reactions    Amlodipine Hives    Bactrim [Sulfamethoxazole-Trimethoprim] Hives, Respiratory Distress and Edema    Glipizide Edema     Throat swelling    Lisinopril Edema     Tongue swelling    Losartan Other (See Comments), Swelling and Edema         Pertinent labs:  Recent Labs   Lab 12/01/22  0434 11/30/22  1103 11/29/22  1457 11/29/22  0449 11/28/22  0940 11/28/22  0454 11/27/22  1956   Sodium 140 136  --  136 138 132* 136   Potassium 4.6 6.1* 5.5* 5.8* 5.2 5.5* 5.5*   Chloride 111 110  --  111 114* 113* 113*   CO2 21 19  --  19 17 16* 17   BUN 66.0* 59.0*  --  44.0* 36.0* 35.0* 32.0*   Creatinine 3.6* 4.0*  --  3.5* 2.6* 2.5* 2.1*   Glucose 102* 168*  --  162* 116* 349* 271*   Calcium 7.1* 7.1*  --  7.5* 7.5* 7.4* 7.9*   Phosphorus  --   --   --   --   --  5.0*  --    eGFR 15.4* 13.6*  --  15.9* 22.7*  23.8* 29.4*   WBC 11.17* 10.71*  --  11.83*  --  12.49* 15.48*   Hematocrit 25.7* 26.8*  --  29.9*  --  28.6* 31.4*   Hgb 7.5* 7.8*  --  8.8*  --  8.2* 9.1*   Lipase  --   --   --   --   --   --  39   Hemoglobin A1C  --   --   --   --   --  6.6*  --        Physical Assessment: 12/01/2022  Head: no overt s/s subcutaneous muscle or fat loss  Upper Body: no overt s/s subcutaneous muscle or fat loss  Lower Body: no s/s subcutaneous fat or muscle loss and edema: no sign of fluid accumulation RLE/LLE non pitting  Skin: scars, scattered per flow sheet  GI function: WDL, rounded last BM 1/21 bloating                                                                 Nutrition Diagnosis      Impaired Nutrient Utilization related to unknown source as evidenced by with elevated phosphorus 5.0. - new                                                               Intervention     Nutrition recommendation - Please refer to top of note                                                              Monitoring/Evaluation   Goals:   1. Patient to meet >75% of estimated needs through meals on f/u. - new  Nutrition Risk Level: Moderate (will follow up at least 1 time per week and PRN)      Maurilio Lovely, [place], [place]  Clinical Dietitian  [phone]

## 2022-12-01 NOTE — Plan of Care (Signed)
 NURSING SHIFT NOTE     Patient: [name]  Day: 2      SHIFT EVENTS     Shift Narrative/Significant Events (PRN med administration, fall, RRT, etc.):      A/O x 4, Stated less SOB today, walking in the hallway, IV Bumex switch to PO Bumex, Voided 1400 ml, NSR on tele, denies any complaint.      Safety and fall precautions remain in place. Purposeful rounding completed.          ASSESSMENT     Changes in assessment from patient's baseline this shift:    Neuro: No  CV: No  Pulm: No  Peripheral Vascular: No  HEENT: No  GI: No  BM during shift: No, Last BM: Last BM Date: 11/30/22  GU: No   Integ: No  MS: No    Pain: None        Mobility: PMP Activity: Step 6 - Walks in Room of Distance Walked (ft) (Step 6,7): 30 Feet           Lines     Patient Lines/Drains/Airways Status       Active Lines, Drains and Airways       Name Placement date Placement time Site Days    Peripheral IV 11/30/22 20 G Anterior;Right Upper Arm 11/30/22  1200  Upper Arm  1                         VITAL SIGNS     Vitals:    12/01/22 1209   BP: 143/83   Pulse: 80   Resp: 18   Temp: 98.1 F (36.7 C)   SpO2: 95%       Temp  Min: 97.9 F (36.6 C)  Max: 98.8 F (37.1 C)  Pulse  Min: 76  Max: 94  Resp  Min: 17  Max: 20  BP  Min: 95/62  Max: 159/81  SpO2  Min: 95 %  Max: 100 %      Intake/Output Summary (Last 24 hours) at 12/01/2022 1428  Last data filed at 12/01/2022 1100  Gross per 24 hour   Intake 736 ml   Output 2450 ml   Net -1714 ml              [employer]GEN [employer]ING           CARE PLAN

## 2022-12-01 NOTE — Respiratory Progress Note (Signed)
Respiratory Therapy                              Patient Re-Assessment Note/Protocol Order Changes    Patient has been assessed and re-evaluated for the follow therapies:       Respiratory Orders   (From admission, onward)                 Start     Ordered    11/28/22 0800  MDI/DPI  2 times daily (RT)      Comments:   All Adult patients ordered for Respiratory Therapy, i.e., inhaled meds, secretion clearance/lung expansion or Oxygen greater than 5 liters/min will be evaluated by a Respiratory Therapist and assessed per Respiratory Therapy Patient Driven Protocol.  Initial assessment and changes made per protocol can be found in the progress note section of the patient chart.    11/28/22 0547                  IP Meds - Nasal and Inhaled (From admission, onward)      Start     Stop Status Route Frequency Ordered    11/28/22 0800  budesonide-formoterol (SYMBICORT) 160-4.5 MCG/ACT inhaler 2 puff         -- Dispensed IN RT - 2 times daily 11/28/22 0516    11/28/22 0115  albuterol (PROVENTIL) (2.5 MG/3ML) 0.083% nebulizer solution 2.5 mg         -- Dispensed NEBULIZATION RT - Every 4 hours as needed 11/28/22 0106                 Current Criteria For Therapy  Secretion Clearance: None indicated  Lung Expansion: None indicated  Medications: Hx of COPD, Asthma, Bronchitis or other documented RAD    Expected Outcomes          Meds: Return to baseline home regimen    Outcomes Met        Meds: Yes       Reassessment Recommendations  Recommendations: Continue current treatment plan    Patient's orders have been modified as follows per RT Patient Driven Protocol.    <free text>    If any questions, please contact the Respiratory Therapist assigned to this patient    Thank you

## 2022-12-01 NOTE — Progress Notes (Addendum)
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 Baseline Cr ~2.1, may be higher now  3. Hyperkalemia -  better  4. Acidosis - improving  5. SHP with hypoCa - low alb, PTH ~ goal  6. Anemia - low, below goal   7. HTN - appears labile  - ARB on hold  8. DM2 - as above, A1C 6.6 in Jan  9. Asthma exacerbation - on duonebs  10. Diastolic heart failure - not overloaded on exam  11. Morbid obesity  - encouraged dietary change  12. 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       Recommendations:   Check phos, ferritin, iron stores, UPCR  Start aranesp  Start bumex 2 mg po bid      Case discussed with: pt and hospitalist      Corky Mull, MD  [facility]  [phone] (office)      Subjective:   Breathing improved    Review of Systems:   No cp    Physical Exam:     Vitals:    12/01/22 0416 12/01/22 0712 12/01/22 0753 12/01/22 0838   BP: 108/59 141/84  140/76   Pulse: 76 77 82    Resp: 20      Temp: 97.9 F (36.6 C) 98.1 F (36.7 C)     TempSrc: Oral Oral     SpO2: 98% 100%     Weight:  143.3 kg (316 lb)         Intake and Output Summary (Last 24 hours) at Date Time    Intake/Output Summary (Last 24 hours) at 12/01/2022 0859  Last data filed at 12/01/2022 0600  Gross per 24 hour   Intake 500 ml   Output 1850 ml   Net -1350 ml         General: awake, alert, no acute distress  Cardiovascular: regular rate and rhythm, no murmurs, rubs or gallops  Lungs: clear to auscultation bilaterally, without wheezing, rhonchi, or rales  Abdomen: soft, non-tender, non-distended, normoactive bowel sounds  Extremities: trace B LE edema, 1+ flank and UE edema      Meds:      Scheduled Meds: PRN Meds:    apixaban, 5 mg, Oral, Q12H SCH  ARIPiprazole, 10 mg, Oral, Daily  aspirin, 81 mg, Oral,  Daily  atorvastatin, 80 mg, Oral, QHS  budesonide-formoterol, 2 puff, Inhalation, BID  bumetanide, 2 mg, Intravenous, Daily  carvedilol, 25 mg, Oral, Q12H SCH  cefTRIAXone, 1 g, Intravenous, Q24H  docusate sodium, 100 mg, Oral, QHS  DULoxetine, 20 mg, Oral, Daily  famotidine, 20 mg, Oral, Daily  folic acid, 1 mg, Oral, Daily  insulin glargine, 20 Units, Subcutaneous, Q12H  insulin lispro, 1-4 Units, Subcutaneous, QHS  insulin lispro, 1-8 Units, Subcutaneous, TID AC  montelukast, 10 mg, Oral, QHS  pregabalin, 150 mg, Oral, BID  sertraline, 50 mg, Oral, Daily  sodium bicarbonate, 1,300 mg, Oral, TID  traZODone, 50 mg, Oral, QHS          Continuous Infusions:   acetaminophen, 650 mg, Q6H PRN  albuterol, 2.5 mg, Q4H PRN  benzocaine-menthol, 1 lozenge, Q2H PRN  benzonatate, 100 mg, TID PRN  calcium carbonate, 500 mg, 4X  Daily PRN  artificial tears (REFRESH PLUS), 1 drop, TID PRN  dextrose, 15 g of glucose, PRN   Or  dextrose, 12.5 g, PRN   Or  dextrose, 12.5 g, PRN   Or  glucagon (rDNA), 1 mg, PRN  hydrALAZINE, 5 mg, Q6H PRN  melatonin, 3 mg, QHS PRN  naloxone, 0.2 mg, PRN  ondansetron, 4 mg, Q4H PRN   Or  ondansetron, 4 mg, Q4H PRN  oxyCODONE, 10 mg, Q6H PRN  polyethylene glycol, 17 g, Daily PRN  saline, 2 spray, Q4H PRN  simethicone, 80 mg, Q6H PRN              Labs:     Recent Labs   Lab 12/01/22  0434 11/30/22  1103 11/29/22  0449   WBC 11.17* 10.71* 11.83*   Hgb 7.5* 7.8* 8.8*   Hematocrit 25.7* 26.8* 29.9*   Platelets 307 317 339       Recent Labs   Lab 12/01/22  0434 11/30/22  1103 11/29/22  1457 11/29/22  0449 11/28/22  0940 11/28/22  0454   Sodium 140 136  --  136  More results in Results Review 132*   Potassium 4.6 6.1* 5.5* 5.8*  More results in Results Review 5.5*   Chloride 111 110  --  111  More results in Results Review 113*   CO2 21 19  --  19  More results in Results Review 16*   BUN 66.0* 59.0*  --  44.0*  More results in Results Review 35.0*   Creatinine 3.6* 4.0*  --  3.5*  More results in Results  Review 2.5*   Calcium 7.1* 7.1*  --  7.5*  More results in Results Review 7.4*   Albumin 2.3* 2.3*  --  2.3*  --   --    Phosphorus  --   --   --   --   --  5.0*   Glucose 102* 168*  --  162*  More results in Results Review 349*   eGFR 15.4* 13.6*  --  15.9*  More results in Results Review 23.8*   More results in Results Review = values in this interval not displayed.         Recent Labs   Lab 11/27/22  2215   Urine Type Urine, Clean Ca   Color, UA Yellow   Clarity, UA Clear   Specific Gravity UA 1.025   Urine pH 6.0   Nitrite, UA Negative   Ketones UA Negative   Urobilinogen, UA 0.2   Bilirubin, UA Negative   Blood, UA Trace-intact*   RBC, UA 6-10*   WBC, UA 6-10*             Imaging personally reviewed, including: No results found.        Signed by: Corky Mull, MD

## 2022-12-01 NOTE — Progress Notes (Addendum)
[facility]  PROGRESS NOTE      Patient: [name]  Date: 12/01/2022   LOS: 2 Days  Admission Date: 11/27/2022   MRN: [id number]  Attending: Lisa Roca, MD  When on service as the attending, please contact me on [place]ague from 7 AM - 7 PM for non-urgent issues. For urgent matters use XTend page from 7 AM - 7 PM.       ASSESSMENT/PLAN     [name] is a 42 y.o. female admitted with Asthma exacerbation attacks    Interval Summary:  Overview/Plan: [name] is a 42 year old lady who has a history of underlying CKD DVT PE and heart failure with preserved ejection fraction presented to the hospital with a chief complaint of shortness of breath and chest pain on 11/27/2022  She has been seen by various disciplines including cardiology and nephrology and currently improved with diuretics  Diuretic doses have been adjusted further by nephrology today  She had a VQ scan done on this admission which revealed no evidence of pulmonary emboli  She continues on oral Eliquis  She had been improved and will likely be discharged in the next 24 to 48 hours based on her clinical course  Unfortunately she may need to be dialyzed in the very near future she has had issues with derangements as well as hyperkalemia which eventually has stabilized this morning    For Tomorrow: Discussed with nephrology about timing of discharge  Patient intends to catch a flight to [place] on Thursday, January 25th  2024    Anticipated Discharge: 24 to 48 hours    Barriers to Discharge: Ongoing adjustment of medications and diuretics    Family/Social/Case Mgr Assistance: Patient's daughter to assist with discharge when medically stable for discharge    Patient [facility] Problem List:  Asthma exacerbation attacks (11/27/2022)     Continue DuoNebs  Continue with Singulair  Clinically improved therefore no need for steroids at present time  Continue Symbicort 2 puffs twice daily  Continue singular 10 mg p.o.  nightly    Anemia due to CKD  Continue Aranesp 0.45 mcg/kg subcu every 28 days  Transfuse blood for hemoglobin less than 7    Chest pains with dyspnea  VQ scan negative for PE  Chest x-ray negative for fluid overload/CHF  Troponins are flat.    Rule out acute bronchitis  Continue IV ceftriaxone.    Metabolic acidosis  Continues on bicarbonate tablets 1000 mg p.o. 3 times daily    Hypertension  Controlled on Coreg 25 mg p.o. every 12 hours  Diovan on hold due to AKI    Hyperlipidemia continue with Lipitor 80 mg nightly    Diabetes mellitus  Continue with Lantus and sliding scale coverage.  Monitor blood sugar and adjust medications  Blood sugars borderline low and therefore Lantus dose decreased from 30 units SQ every 12 hours to 20 units SQ every 12 hours today 12/01/2022    Hyperkalemia  Likely due to metabolic acidosis/AKI  This has resolved with low potassium diet, Kayexalate and Bumex    CKD stage IIIb with AKI  Patient seen by Dr. Conley Canal and Dr. Nevin Bloodgood and Dr. Corky Mull nephrology much appreciated  Avoid nephrotoxic agents  Transition to Bumex 2 mg p.o. twice daily    Acute on chronic diastolic heart failure  Fluid overloaded  On Bumex 2 mg p.o. twice daily  If improved on this regimen potentially could be discharged in the next 24  hours      DVT PE  No evidence of PE on VQ scan this admission  Continue anticoagulation with Eliquis 5 mg p.o. twice daily every 12 hours.          Obesity  Diet exercise weight loss and lifestyle modification    Plan discussed with patient and bedside RN               Nutrition: Consistent carbohydrate cardiac diet          Patient has BMI=Body mass index is 51 kg/m.  Diagnosis: Obesity based on BMI criteria          Recent Labs     12/01/22  0434 11/30/22  1103 11/29/22  0449   Sodium 140 136 136       Diagnosis: Mild Hyponatremia              Recent Labs     12/01/22  0434 11/30/22  1103 11/29/22  1457 11/29/22  0449   Potassium 4.6 6.1* 5.5* 5.8*       Diagnosis:  Hyperkalemia        Code Status: Full Code    Dispo: To be determined    Family Contact: Patient's daughter    DVT Prophylaxis:   Current Facility-Administered Medications (Includes Only Anticoagulants, Misc. Hematological)   Medication Dose Route Last Admin    apixaban (ELIQUIS) tablet 5 mg  5 mg Oral 5 mg at 12/01/22 1610          CHART  REVIEW & DISCUSSION     The following chart items were reviewed as of 9:57 AM on 12/01/22:  [x]  Lab Results [x]  Imaging Results   []  Problem List  [x]  Current Orders [x]  Current Medications  []  Allergies  [x]  Code Status [x]  Previous Notes   []  SDoH    The management and plan of care for this patient was discussed with the following specialty consultants:  [x]  Cardiology  []  Gastroenterology                 []  Infectious Disease  []  Pulmonology []  Neurology                [x]  Nephrology  []  Neurosurgery []  Orthopedic Surgery  []  Heme/Onc  []  General Surgery []  Psychiatry                                   []  Palliative    SUBJECTIVE     [name] feels bloated and had some dyspnea with exertion earlier today    MEDICATIONS     Current Facility-Administered Medications   Medication Dose Route Frequency    apixaban  5 mg Oral Q12H SCH    ARIPiprazole  10 mg Oral Daily    aspirin  81 mg Oral Daily    atorvastatin  80 mg Oral QHS    budesonide-formoterol  2 puff Inhalation BID    bumetanide  2 mg Intravenous Daily    carvedilol  25 mg Oral Q12H SCH    cefTRIAXone  1 g Intravenous Q24H    darbepoetin alfa  0.45 mcg/kg Subcutaneous Q28 Days    docusate sodium  100 mg Oral QHS    DULoxetine  20 mg Oral Daily    famotidine  20 mg Oral Daily    folic acid  1 mg Oral Daily    insulin glargine  20  Units Subcutaneous Q12H    insulin lispro  1-4 Units Subcutaneous QHS    insulin lispro  1-8 Units Subcutaneous TID AC    montelukast  10 mg Oral QHS    pregabalin  150 mg Oral BID    sertraline  50 mg Oral Daily    sodium bicarbonate  1,300 mg Oral TID    traZODone  50 mg Oral QHS        PHYSICAL EXAM     Vitals:    12/01/22 0838   BP: 140/76   Pulse:    Resp:    Temp:    SpO2:        Temperature: Temp  Min: 97.9 F (36.6 C)  Max: 98.8 F (37.1 C)  Pulse: Pulse  Min: 76  Max: 94  Respiratory: Resp  Min: 17  Max: 20  Non-Invasive BP: BP  Min: 95/62  Max: 159/81  Pulse Oximetry SpO2  Min: 98 %  Max: 100 %    Intake and Output Summary (Last 24 hours) at Date Time    Intake/Output Summary (Last 24 hours) at 12/01/2022 0957  Last data filed at 12/01/2022 0600  Gross per 24 hour   Intake 500 ml   Output 1850 ml   Net -1350 ml           GEN APPEARANCE: This lady in mild respiratory distress A&OX3  HEENT: PERLA; EOMI; Conjunctiva Clear  NECK: Supple; No bruits  CVS: RRR, S1, S2; No M/G/R  LUNGS: Basilar rales  ABD: Soft; No TTP; + Normoactive BS  EXT: Trace to +1 edema pulses 2+ and intact  NEURO: CN 2-12 intact; No Focal neurological deficits  MENTAL STATUS: Awake alert and oriented x 3    LABS     Recent Labs   Lab 12/01/22  0434 11/30/22  1103 11/29/22  0449   WBC 11.17* 10.71* 11.83*   RBC 2.91* 3.02* 3.41*   Hgb 7.5* 7.8* 8.8*   Hematocrit 25.7* 26.8* 29.9*   MCV 88.3 88.7 87.7   Platelets 307 317 339         Recent Labs   Lab 12/01/22  0434 11/30/22  1103 11/29/22  1457 11/29/22  0449 11/28/22  0940 11/28/22  0454   Sodium 140 136  --  136 138 132*   Potassium 4.6 6.1* 5.5* 5.8* 5.2 5.5*   Chloride 111 110  --  111 114* 113*   CO2 21 19  --  19 17 16*   BUN 66.0* 59.0*  --  44.0* 36.0* 35.0*   Creatinine 3.6* 4.0*  --  3.5* 2.6* 2.5*   Glucose 102* 168*  --  162* 116* 349*   Calcium 7.1* 7.1*  --  7.5* 7.5* 7.4*         Recent Labs   Lab 12/01/22  0434 11/30/22  1103 11/29/22  0449   ALT 8 8 6    AST (SGOT) 7 13 5    Bilirubin, Total 0.2 0.3 0.1*   Albumin 2.3* 2.3* 2.3*   Alkaline Phosphatase 78 78 88         Recent Labs   Lab 11/27/22  2152 11/27/22  1956   hs Troponin-I 10.7 8.9   hs Troponin-I Delta 1.8  --                Microbiology Results (last 15 days)       Procedure Component Value  Units Date/Time    MRSA culture [[id number]] Collected: 11/29/22  2113    Order Status: Completed Specimen: Culturette from Nasal Swab Updated: 11/30/22 2330     Culture MRSA Surveillance Negative for Methicillin Resistant Staph aureus    MRSA culture [[id number]]  (Abnormal) Collected: 11/29/22 2113    Order Status: Completed Specimen: Culturette from Throat Updated: 11/30/22 2331     Culture MRSA Surveillance Positive for Methicillin Resistant Staph aureus    Urine culture [[id number]] Collected: 11/28/22 0940    Order Status: Completed Specimen: Urine, Clean Catch Updated: 11/29/22 1617    Narrative:      Indications for Urine Culture:->Costovertebral Angle  Tenderness  ORDER#: [id number]                                    ORDERED BY: Chessa Barrasso  SOURCE: Urine, Clean Catch                           COLLECTED:  11/28/22 09:40  ANTIBIOTICS AT COLL.:                                RECEIVED :  11/28/22 14:53  Culture Urine                              FINAL       11/29/22 16:17  11/29/22   1,000 - 9,000 CFU/ML of normal urogenital or skin microbiota, no further             work      Culture Blood Aerobic and Anaerobic [[id number]] Collected: 11/28/22 0806    Order Status: Completed Specimen: Blood, Venipuncture Updated: 11/30/22 1221    Narrative:      The order will result in two separate 8-82ml bottles  Please do NOT order repeat blood cultures if one has been  drawn within the last 48 hours  UNLESS concerned for  endocarditis  AVOID BLOOD CULTURE DRAWS FROM CENTRAL LINE IF POSSIBLE  Indications:->Sepsis  ORDER#: [id number]                                    ORDERED BY: GOARD, JODY  SOURCE: Blood, Venipuncture                          COLLECTED:  11/28/22 08:06  ANTIBIOTICS AT COLL.:                                RECEIVED :  11/28/22 11:31  Culture Blood Aerobic and Anaerobic        PRELIM      11/30/22 12:21  11/29/22   No Growth after 1 day/s of incubation.  11/30/22   No Growth after 2 day/s of incubation.       Culture Blood Aerobic and Anaerobic [[id number]] Collected: 11/28/22 0806    Order Status: Completed Specimen: Blood, Venipuncture Updated: 11/30/22 1221    Narrative:      The order will result in two separate 8-61ml bottles  Please do NOT order repeat blood cultures if one has been  drawn within the last 48 hours  UNLESS concerned for  endocarditis  AVOID BLOOD CULTURE DRAWS FROM CENTRAL LINE IF POSSIBLE  Indications:->Sepsis  ORDER#: [id number]                                    ORDERED BY: GOARD, JODY  SOURCE: Blood, Venipuncture                          COLLECTED:  11/28/22 08:06  ANTIBIOTICS AT COLL.:                                RECEIVED :  11/28/22 11:31  Culture Blood Aerobic and Anaerobic        PRELIM      11/30/22 12:21  11/29/22   No Growth after 1 day/s of incubation.  11/30/22   No Growth after 2 day/s of incubation.      COVID-19 (SARS-CoV-2) and Influenza A/B, NAA (Liat Rapid) [[id number]] Collected: 11/27/22 1956    Order Status: Completed Specimen: Culturette from Nasopharyngeal Updated: 11/27/22 2026     Purpose of COVID testing Diagnostic -[facility]     SARS-CoV-2 Specimen Source Nasal Swab     SARS CoV 2 Overall Result Not Detected     Comment: __________________________________________________  -A result of "Detected" indicates POSITIVE for the    presence of SARS CoV-2 RNA  -A result of "Not Detected" indicates NEGATIVE for the    presence of SARS CoV-2 RNA  __________________________________________________________  Test performed using the Roche cobas Liat SARS-CoV-2 assay. This assay is  only for use under the Food and Drug Administration's Emergency Use  Authorization. This is a real-time RT-PCR assay for the qualitative  detection of SARS-CoV-2 RNA. Viral nucleic acids may persist in vivo,  independent of viability. Detection of viral nucleic acid does not imply the  presence of infectious virus, or that virus nucleic acid is the cause of  clinical symptoms. Negative results do not preclude  SARS-CoV-2 infection and  should not be used as the sole basis for diagnosis, treatment or other  patient management decisions. Negative results must be combined with  clinical observations, patient history, and/or epidemiological information.  Invalid results may be due to inhibiting substances in the specimen and  recollection should occur. Please see Fact Sheets for patients and providers  located:  WirelessDSLBlog.no          Influenza A Not Detected     Influenza B Not Detected     Comment: Test performed using the Roche cobas Liat SARS-CoV-2 & Influenza A/B assay.  This assay is only for use under the Food and Drug Administration's  Emergency Use Authorization. This is a multiplex real-time RT-PCR assay  intended for the simultaneous in vitro qualitative detection and  differentiation of SARS-CoV-2, influenza A, and influenza B virus RNA. Viral  nucleic acids may persist in vivo, independent of viability. Detection of  viral nucleic acid does not imply the presence of infectious virus, or that  virus nucleic acid is the cause of clinical symptoms. Negative results do  not preclude SARS-CoV-2, influenza A, and/or influenza B infection and  should not be used as the sole basis for diagnosis, treatment or other  patient management decisions. Negative results must be combined with  clinical observations, patient history, and/or epidemiological information.  Invalid results may be due to inhibiting substances in the specimen and  recollection should occur. Please see Fact Sheets for patients and providers  located: http://www.rice.biz/.         Narrative:      o Collect and clearly label specimen type:  o PREFERRED-Upper respiratory specimen: One Nasal Swab in  Transport Media.  o Hand deliver to laboratory ASAP  Diagnostic -[facility]             RADIOLOGY     NM Lung Scan Perfusion w/Spect CT    Result Date: 11/28/2022   Normal perfusion lung scan. Collene Schlichter, MD  11/28/2022 2:37 PM    Chest 2 Views    Result Date: 11/27/2022  1. No acute disease. Kennyth Lose, MD 11/27/2022 8:18 PM    [place] VenoDopp Low Extremity Left    Result Date: 11/20/2022   No evidence of left lower extremity DVT. Darra Lis, MD 11/20/2022 12:36 PM   Echo Results       None          No results found for this or any previous visit.    Signed,  Gwyndolyn Kaufman, MD  9:57 AM 12/01/2022

## 2022-12-02 LAB — IRON PROFILE
Iron Saturation: 18 % (ref 15–50)
Iron: 30 ug/dL — ABNORMAL LOW (ref 32–157)
TIBC: 166 ug/dL — ABNORMAL LOW (ref 265–497)
UIBC: 136 ug/dL (ref 126–382)

## 2022-12-02 LAB — COMPREHENSIVE METABOLIC PANEL
ALT: 9 U/L (ref 0–55)
AST (SGOT): 9 U/L (ref 5–41)
Albumin/Globulin Ratio: 0.8 — ABNORMAL LOW (ref 0.9–2.2)
Albumin: 2.2 g/dL — ABNORMAL LOW (ref 3.5–5.0)
Alkaline Phosphatase: 75 U/L (ref 37–117)
Anion Gap: 7 (ref 5.0–15.0)
BUN: 69 mg/dL — ABNORMAL HIGH (ref 7.0–21.0)
Bilirubin, Total: 0.2 mg/dL (ref 0.2–1.2)
CO2: 22 mEq/L (ref 17–29)
Calcium: 7.1 mg/dL — ABNORMAL LOW (ref 8.5–10.5)
Chloride: 109 mEq/L (ref 99–111)
Creatinine: 3.2 mg/dL — ABNORMAL HIGH (ref 0.4–1.0)
Globulin: 2.8 g/dL (ref 2.0–3.6)
Glucose: 162 mg/dL — ABNORMAL HIGH (ref 70–100)
Potassium: 4.8 mEq/L (ref 3.5–5.3)
Protein, Total: 5 g/dL — ABNORMAL LOW (ref 6.0–8.3)
Sodium: 138 mEq/L (ref 135–145)
eGFR: 17.7 mL/min/{1.73_m2} — AB (ref >=60)

## 2022-12-02 LAB — CBC AND DIFFERENTIAL
Absolute NRBC: 0 10*3/uL (ref 0.00–0.00)
Basophils Absolute Automated: 0.05 10*3/uL (ref 0.00–0.08)
Basophils Automated: 0.5 %
Eosinophils Absolute Automated: 0.33 10*3/uL (ref 0.00–0.44)
Eosinophils Automated: 3.1 %
Hematocrit: 26.3 % — ABNORMAL LOW (ref 34.7–43.7)
Hgb: 7.8 g/dL — ABNORMAL LOW (ref 11.4–14.8)
Immature Granulocytes Absolute: 0.05 10*3/uL (ref 0.00–0.07)
Immature Granulocytes: 0.5 %
Instrument Absolute Neutrophil Count: 6.66 10*3/uL — ABNORMAL HIGH (ref 1.10–6.33)
Lymphocytes Absolute Automated: 2.71 10*3/uL (ref 0.42–3.22)
Lymphocytes Automated: 25.7 %
MCH: 26 pg (ref 25.1–33.5)
MCHC: 29.7 g/dL — ABNORMAL LOW (ref 31.5–35.8)
MCV: 87.7 fL (ref 78.0–96.0)
MPV: 10.7 fL (ref 8.9–12.5)
Monocytes Absolute Automated: 0.73 10*3/uL (ref 0.21–0.85)
Monocytes: 6.9 %
Neutrophils Absolute: 6.66 10*3/uL — ABNORMAL HIGH (ref 1.10–6.33)
Neutrophils: 63.3 %
Nucleated RBC: 0 /100 WBC (ref 0.0–0.0)
Platelets: 321 10*3/uL (ref 142–346)
RBC: 3 10*6/uL — ABNORMAL LOW (ref 3.90–5.10)
RDW: 15 % (ref 11–15)
WBC: 10.53 10*3/uL — ABNORMAL HIGH (ref 3.10–9.50)

## 2022-12-02 LAB — WHOLE BLOOD GLUCOSE POCT
Whole Blood Glucose POCT: 93 mg/dL (ref 70–100)
Whole Blood Glucose POCT: 92 mg/dL (ref 70–100)

## 2022-12-02 LAB — HEMOLYSIS INDEX(SOFT): Hemolysis Index: 2 Index (ref 0–24)

## 2022-12-02 LAB — FERRITIN: Ferritin: 145.4 ng/mL (ref 4.60–204.00)

## 2022-12-02 LAB — PHOSPHORUS: Phosphorus: 5.8 mg/dL — ABNORMAL HIGH (ref 2.3–4.7)

## 2022-12-02 MED ORDER — BUDESONIDE-FORMOTEROL FUMARATE 80-4.5 MCG/ACT IN AERO
2.0000 | INHALATION_SPRAY | Freq: Two times a day (BID) | RESPIRATORY_TRACT | 0 refills | Status: DC
Start: 2022-12-02 — End: 2022-12-13

## 2022-12-02 NOTE — Progress Notes (Signed)
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 Baseline Cr ~2.1, may be higher now  3. Hyperkalemia -  resolved   4. Acidosis - improving  5. SHP with hypoCa - low alb, PTH ~ goal  6. Anemia - low, below goal - will arrange for ESA as  OP   7. HTN - appears labile  - ARB on hold  8. DM2 - as above, A1C 6.6 in Jan  9. Asthma exacerbation - on duonebs  10. Diastolic heart failure -colume overload after holding bumex and receiving NS  11. Morbid obesity  - encouraged dietary change  12. 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       Recommendations:   Has an appt with me tomorrow  S/p aranesp yesterday  Continue bumex 2 mg po bid  Continue to hold olmesartan - we may resume at a lower dose at home   We will add veltassa as OP if needed to control K   Low K diet       Case discussed with: pt       Verl Blalock, MD  [facility]  [id number] (office)      Subjective:   In good spirits     Review of Systems:   No cp    Physical Exam:     Vitals:    12/02/22 0741 12/02/22 0841 12/02/22 1005 12/02/22 1117   BP:  130/75  132/80   Pulse: 77   73   Resp:       Temp:    97.9 F (36.6 C)   TempSrc:    Oral   SpO2:   95% 100%   Weight:           Intake and Output Summary (Last 24 hours) at Date Time    Intake/Output Summary (Last 24 hours) at 12/02/2022 1533  Last data filed at 12/02/2022 1500  Gross per 24 hour   Intake 1376 ml   Output 3400 ml   Net -2024 ml         General: awake, alert, no acute distress  Cardiovascular: regular rate and rhythm  Lungs: clear to auscultation anteriorly, no wheezing  Abdomen: soft, increased adiposity   Extremities: 1+ edema      Meds:      Scheduled Meds: PRN Meds:    apixaban, 5 mg, Oral, Q12H [place]  ARIPiprazole, 10 mg, Oral,  Daily  aspirin, 81 mg, Oral, Daily  atorvastatin, 80 mg, Oral, QHS  budesonide-formoterol, 2 puff, Inhalation, BID  bumetanide, 2 mg, Oral, BID  carvedilol, 25 mg, Oral, Q12H SCH  cefTRIAXone, 1 g, Intravenous, Q24H  darbepoetin alfa, 0.45 mcg/kg, Subcutaneous, Q28 Days  docusate sodium, 100 mg, Oral, QHS  DULoxetine, 20 mg, Oral, Daily  famotidine, 20 mg, Oral, Daily  folic acid, 1 mg, Oral, Daily  insulin glargine, 20 Units, Subcutaneous, Q12H  insulin lispro, 1-4 Units, Subcutaneous, QHS  insulin lispro, 1-8 Units, Subcutaneous, TID AC  montelukast, 10 mg, Oral, QHS  pregabalin, 150 mg, Oral, BID  sertraline, 50 mg, Oral, Daily  sodium bicarbonate, 1,300 mg, Oral, TID  traZODone, 50 mg, Oral, QHS  Continuous Infusions:   acetaminophen, 650 mg, Q6H PRN  albuterol, 2.5 mg, Q4H PRN  benzocaine-menthol, 1 lozenge, Q2H PRN  benzonatate, 100 mg, TID PRN  calcium carbonate, 500 mg, 4X Daily PRN  artificial tears (REFRESH PLUS), 1 drop, TID PRN  dextrose, 15 g of glucose, PRN   Or  dextrose, 12.5 g, PRN   Or  dextrose, 12.5 g, PRN   Or  glucagon (rDNA), 1 mg, PRN  hydrALAZINE, 5 mg, Q6H PRN  melatonin, 3 mg, QHS PRN  naloxone, 0.2 mg, PRN  ondansetron, 4 mg, Q4H PRN   Or  ondansetron, 4 mg, Q4H PRN  oxyCODONE, 10 mg, Q6H PRN  polyethylene glycol, 17 g, Daily PRN  saline, 2 spray, Q4H PRN  simethicone, 80 mg, Q6H PRN              Labs:     Recent Labs   Lab 12/02/22  0427 12/01/22  0434 11/30/22  1103   WBC 10.53* 11.17* 10.71*   Hgb 7.8* 7.5* 7.8*   Hematocrit 26.3* 25.7* 26.8*   Platelets 321 307 317       Recent Labs   Lab 12/02/22  0427 12/01/22  0434 11/30/22  1103 11/28/22  0940 11/28/22  0454   Sodium 138 140 136  More results in Results Review 132*   Potassium 4.8 4.6 6.1*  More results in Results Review 5.5*   Chloride 109 111 110  More results in Results Review 113*   CO2 22 21 19   More results in Results Review 16*   BUN 69.0* 66.0* 59.0*  More results in Results Review 35.0*   Creatinine 3.2* 3.6* 4.0*   More results in Results Review 2.5*   Calcium 7.1* 7.1* 7.1*  More results in Results Review 7.4*   Albumin 2.2* 2.3* 2.3*  More results in Results Review  --    Phosphorus 5.8*  --   --   --  5.0*   Glucose 162* 102* 168*  More results in Results Review 349*   eGFR 17.7* 15.4* 13.6*  More results in Results Review 23.8*   More results in Results Review = values in this interval not displayed.         Recent Labs   Lab 11/27/22  2215   Urine Type Urine, Clean Ca   Color, UA Yellow   Clarity, UA Clear   Specific Gravity UA 1.025   Urine pH 6.0   Nitrite, UA Negative   Ketones UA Negative   Urobilinogen, UA 0.2   Bilirubin, UA Negative   Blood, UA Trace-intact*   RBC, UA 6-10*   WBC, UA 6-10*       Recent Labs   Lab 12/01/22  1119   Urine Protein/Creatinine Ratio 3.1   Urine Creatinine, Random 55.1         Signed by: Verl Blalock, MD

## 2022-12-02 NOTE — Discharge Summary (Signed)
[facility]      Patient: [name]  Admission Date: 11/27/2022   DOB: September 26, 1981  Discharge Date: 12/02/2022    MRN: [id number]  Discharge Attending:Taronda Comacho Ruben Reason, MD     Referring Physician: Adah Perl, DO  PCP: Adah Perl, DO       DISCHARGE SUMMARY     Discharge Information   Admission Diagnosis:   Asthma exacerbation attacks    Discharge Diagnosis:   Active Hospital Problems    Diagnosis    Asthma exacerbation attacks   Acute Bronchitis   Anemia due to CKD  Metabolic acidosis  Hypertension  Hyperlipidemia  Diabetes mellitus  Hyperkalemia  CKD stage IIIb with AKI  Acute on chronic diastolic heart failure  DVT PE  Obesity    Admission Condition: Sick  Discharge Condition: Stable  Consultants: Nephrology 0 Dr. Aline August, Cardiology - Dr. Burnett Sheng   Functional Status: As tolerated  Discharged to: [facility] Course   Presentation History   [name] is a 42 y.o. female with a PMHx of DVT on apixaban, CKD 4, DMII on insulin, HFpEF, HTN, HLD, reports 1 week history of cough with small amount of clear sputum production, followed by intermittent chest heaviness, worse with activity, improves with rest. She presented to the ED earlier this evening and was reportedly wheezy on arrival and hypertensive. She was given nebs and IV hydralazine. Work up was significant for WBC 15K, hgb 9.1 (at baseline), glucose 271, creatinine 2.1 (at baseline), potassium 5.5, proBNP 1368. CXR was negative for acute process. Patient was given 40mg  IV lasix and hospitalist was asked to admit her for observation.   Patient seen and examined. She denies chest pain at present. Only current complaint is cough.      See HPI for details.    Hospital Course (3 Days)   [name] is a 42 year old lady who has a history of underlying CKD DVT PE and heart failure with preserved ejection fraction presented to the hospital with a chief complaint of shortness of breath and chest pain on 11/27/2022. She has been seen  by various disciplines including cardiology and nephrology and currently improved with diuretics  She is on Bumex 2 mg twice daily. She had a VQ scan done on this admission which revealed no evidence of pulmonary emboli. She continues on oral Eliquis  She had been improved and her breathing had progressively improved with treatment for her asthma exacerbation that was complicated with acute bronchitis. Unfortunately she may need to be dialyzed in the very near future she has had issues with derangements as well as hyperkalemia which eventually has stabilized. She will follow up closely with her nephrologist. On day of discharge she was able to ambulate on the hallway without any issues.     Procedures/Imaging:   Upon my review: As per radiology          Progress Note/Physical Exam at Discharge     Subjective:Patient is stable for discharge.    Vitals:    12/02/22 0741 12/02/22 0841 12/02/22 1005 12/02/22 1117   BP:  130/75  132/80   Pulse: 77   73   Resp:       Temp:    97.9 F (36.6 C)   TempSrc:    Oral   SpO2:   95% 100%   Weight:           General: NAD, AAOx3  HEENT: Sclera anicteric  Neck: Supple  Cardiovascular: RRR, no m/r/g  Lungs: CTAB, no w/r/r  Abdomen: Soft, NT/ND, no masses, no g/r  Extremities: No C/C/E  Skin: No rashes or lesions noted  Neuro: No Focal neurological deficits       Diagnostics     Labs/Studies Pending at Discharge: No    Last Labs   Recent Labs   Lab 12/02/22  0427 12/01/22  0434 11/30/22  1103   WBC 10.53* 11.17* 10.71*   RBC 3.00* 2.91* 3.02*   Hgb 7.8* 7.5* 7.8*   Hematocrit 26.3* 25.7* 26.8*   MCV 87.7 88.3 88.7   Platelets 321 307 317       Recent Labs   Lab 12/02/22  0427 12/01/22  0434 11/30/22  1103 11/29/22  1457 11/29/22  0449 11/28/22  0940   Sodium 138 140 136  --  136 138   Potassium 4.8 4.6 6.1* 5.5* 5.8* 5.2   Chloride 109 111 110  --  111 114*   CO2 22 21 19   --  19 17   BUN 69.0* 66.0* 59.0*  --  44.0* 36.0*   Creatinine 3.2* 3.6* 4.0*  --  3.5* 2.6*   Glucose 162*  102* 168*  --  162* 116*   Calcium 7.1* 7.1* 7.1*  --  7.5* 7.5*       Microbiology Results (last 15 days)       Procedure Component Value Units Date/Time    MRSA culture [id number] Collected: 11/29/22 2113    Order Status: Completed Specimen: Culturette from Nasal Swab Updated: 11/30/22 2330     Culture MRSA Surveillance Negative for Methicillin Resistant Staph aureus    MRSA culture [id number]  (Abnormal) Collected: 11/29/22 2113    Order Status: Completed Specimen: Culturette from Throat Updated: 11/30/22 2331     Culture MRSA Surveillance Positive for Methicillin Resistant Staph aureus    Urine culture [id number] Collected: 11/28/22 0940    Order Status: Completed Specimen: Urine, Clean Catch Updated: 11/29/22 1617    Narrative:      Indications for Urine Culture:->Costovertebral Angle  Tenderness  ORDER#: [id number]                                    ORDERED BY: CHINERY, ROGER  SOURCE: Urine, Clean Catch                           COLLECTED:  11/28/22 09:40  ANTIBIOTICS AT COLL.:                                RECEIVED :  11/28/22 14:53  Culture Urine                              FINAL       11/29/22 16:17  11/29/22   1,000 - 9,000 CFU/ML of normal urogenital or skin microbiota, no further             work      Culture Blood Aerobic and Anaerobic [id number] Collected: 11/28/22 0806    Order Status: Completed Specimen: Blood, Venipuncture Updated: 12/02/22 1221    Narrative:      The order will result in two separate 8-37ml bottles  Please do NOT order repeat blood cultures if one has been  drawn within the last 48 hours  UNLESS concerned for  endocarditis  AVOID BLOOD CULTURE DRAWS FROM CENTRAL LINE IF POSSIBLE  Indications:->Sepsis  ORDER#: [id number]                                    ORDERED BY: GOARD, JODY  SOURCE: Blood, Venipuncture                          COLLECTED:  11/28/22 08:06  ANTIBIOTICS AT COLL.:                                RECEIVED :  11/28/22 11:31  Culture Blood Aerobic and Anaerobic         PRELIM      12/02/22 12:21  11/29/22   No Growth after 1 day/s of incubation.  11/30/22   No Growth after 2 day/s of incubation.  12/01/22   No Growth after 3 day/s of incubation.  12/02/22   No Growth after 4 day/s of incubation.      Culture Blood Aerobic and Anaerobic [[id number]] Collected: 11/28/22 0806    Order Status: Completed Specimen: Blood, Venipuncture Updated: 12/02/22 1221    Narrative:      The order will result in two separate 8-28ml bottles  Please do NOT order repeat blood cultures if one has been  drawn within the last 48 hours  UNLESS concerned for  endocarditis  AVOID BLOOD CULTURE DRAWS FROM CENTRAL LINE IF POSSIBLE  Indications:->Sepsis  ORDER#: [id number]                                    ORDERED BY: GOARD, JODY  SOURCE: Blood, Venipuncture                          COLLECTED:  11/28/22 08:06  ANTIBIOTICS AT COLL.:                                RECEIVED :  11/28/22 11:31  Culture Blood Aerobic and Anaerobic        PRELIM      12/02/22 12:21  11/29/22   No Growth after 1 day/s of incubation.  11/30/22   No Growth after 2 day/s of incubation.  12/01/22   No Growth after 3 day/s of incubation.  12/02/22   No Growth after 4 day/s of incubation.      COVID-19 (SARS-CoV-2) and Influenza A/B, NAA (Liat Rapid) [[id number]] Collected: 11/27/22 1956    Order Status: Completed Specimen: Culturette from Nasopharyngeal Updated: 11/27/22 2026     Purpose of COVID testing Diagnostic -PUI     SARS-CoV-2 Specimen Source Nasal Swab     SARS CoV 2 Overall Result Not Detected     Comment: __________________________________________________  -A result of "Detected" indicates POSITIVE for the    presence of SARS CoV-2 RNA  -A result of "Not Detected" indicates NEGATIVE for the    presence of SARS CoV-2 RNA  __________________________________________________________  Test performed using the Roche cobas Liat SARS-CoV-2 assay. This assay is  only for use under the Food and Drug Administration's Emergency  Use  Authorization. This is a  real-time RT-PCR assay for the qualitative  detection of SARS-CoV-2 RNA. Viral nucleic acids may persist in vivo,  independent of viability. Detection of viral nucleic acid does not imply the  presence of infectious virus, or that virus nucleic acid is the cause of  clinical symptoms. Negative results do not preclude SARS-CoV-2 infection and  should not be used as the sole basis for diagnosis, treatment or other  patient management decisions. Negative results must be combined with  clinical observations, patient history, and/or epidemiological information.  Invalid results may be due to inhibiting substances in the specimen and  recollection should occur. Please see Fact Sheets for patients and providers  located:  https://www.benson-chung.com/          Influenza A Not Detected     Influenza B Not Detected     Comment: Test performed using the Roche cobas Liat SARS-CoV-2 & Influenza A/B assay.  This assay is only for use under the Food and Drug Administration's  Emergency Use Authorization. This is a multiplex real-time RT-PCR assay  intended for the simultaneous in vitro qualitative detection and  differentiation of SARS-CoV-2, influenza A, and influenza B virus RNA. Viral  nucleic acids may persist in vivo, independent of viability. Detection of  viral nucleic acid does not imply the presence of infectious virus, or that  virus nucleic acid is the cause of clinical symptoms. Negative results do  not preclude SARS-CoV-2, influenza A, and/or influenza B infection and  should not be used as the sole basis for diagnosis, treatment or other  patient management decisions. Negative results must be combined with  clinical observations, patient history, and/or epidemiological information.  Invalid results may be due to inhibiting substances in the specimen and  recollection should occur. Please see Fact Sheets for patients and providers  located:  http://olson-hall.info/.         Narrative:      o Collect and clearly label specimen type:  o PREFERRED-Upper respiratory specimen: One Nasal Swab in  Transport Media.  o Hand deliver to laboratory ASAP  Diagnostic -[facility]I             Patient Instructions   Discharge Diet: Cardiac/Renal/Diabetic   Discharge Activity: As tolerated     Follow Up Appointment:   Follow-up Information       Adah Perl, DO Follow up in 1 week(s).    Specialty: Family Medicine  Contact information:  [address]  [place] [address]  [phone]                              Discharge Medications:     Medication List        START taking these medications      budesonide-formoterol 80-4.5 MCG/ACT inhaler  Commonly known as: SYMBICORT  Inhale 2 puffs into the lungs 2 (two) times daily            CONTINUE taking these medications      albuterol sulfate HFA 108 (90 Base) MCG/ACT inhaler  Commonly known as: PROVENTIL  Inhale 2 puffs into the lungs every 4 (four) hours as needed for Wheezing     Alcohol Swabs Pads  Use as indicated for insulin injections     apixaban 5 MG  Commonly known as: ELIQUIS     ARIPiprazole 5 MG tablet  Commonly known as: ABILIFY     aspirin 81 MG chewable tablet  atorvastatin 40 MG tablet  Commonly known as: LIPITOR     bumetanide 2 MG tablet  Commonly known as: BUMEX  Take 1 tablet (2 mg total) by mouth 2 (two) times daily     carvedilol 25 MG tablet  Commonly known as: COREG     diclofenac Sodium 1 % Gel topical gel  Commonly known as: VOLTAREN  Apply 2 g topically 4 (four) times daily     docusate sodium 100 MG capsule  Commonly known as: COLACE     DULoxetine 20 MG capsule  Commonly known as: CYMBALTA     EPINEPHrine 0.15 MG/0.3ML injection     Fluticasone Propionate Diskus 50 MCG/ACT Aepb     folic acid 1 MG tablet  Commonly known as: FOLVITE     insulin lispro 100 UNIT/ML injection  Commonly known as: HumaLOG  Inject 25 Units into the skin 3 (three) times daily before meals      Insulin Syringe-Needle U-100 27G X 1/2" 1 ML Misc  Inject 1 Dose into the skin 4 (four) times daily     Levemir 100 UNIT/ML injection  Generic drug: insulin detemir  Inject 50 Units into the skin every 12 (twelve) hours     montelukast 10 MG tablet  Commonly known as: SINGULAIR     olmesartan 40 MG tablet  Commonly known as: BENICAR     pregabalin 150 MG capsule  Commonly known as: LYRICA     sertraline 50 MG tablet  Commonly known as: ZOLOFT     sodium bicarbonate 650 MG tablet     traZODone 50 MG tablet  Commonly known as: DESYREL            ASK your doctor about these medications      semaglutide 1 MG/0.5ML injection  Commonly known as: WEGOVY               Where to Get Your Medications        These medications were sent to [facility] [address], [place]  [place], [place] [address]      Phone: [phone]   budesonide-formoterol 80-4.5 MCG/ACT inhaler          Time spent examining patient, discussing with patient/family regarding hospital course, chart review, reconciling medications and discharge planning: 35 minutes.    Signed,  Maricela Bo, MD    12:35 PM 12/02/2022

## 2022-12-02 NOTE — Plan of Care (Signed)
NURSING DISCHARGE  ________________________________________________________________    Patient is discharged per Alarabi's order.   Vital signs were stable and NSR on the telemetry monitor.   PIV line & Telebox was taken out.  Personal belongings now in hand and packed.    Discharge instructions/education was thoroughly done.  Educated on home medications.   Patient verbalized and demonstrated understanding and doesn't have any further questions for the treatment team.  All questions/concerns addressed.

## 2022-12-02 NOTE — Plan of Care (Addendum)
Pt is A&Ox4. SBP elevated to 180smmHg, but decreased to SBP 150s with schedule po carvedilol. Bp remained stable the whole shift. On RA SpO2 >95%. Pt reported pain of 7/10 in bilateral legs; relieved by medication and rest. Pt able to walked around the unit independently.  No significant event overnight. Plan of care discussed with patient at start of shift; all concerns addressed. Safety bundle maintained. Purposeful hourly rounding completed.     Problem: Moderate/High Fall Risk Score >5  Goal: Patient will remain free of falls  Outcome: Progressing  Flowsheets (Taken 12/02/2022 0215)  Moderate Risk (6-13):   LOW-Fall Interventions Appropriate for Low Fall Risk   LOW-Anticoagulation education for injury risk     Problem: Hemodynamic Status: Cardiac  Goal: Stable vital signs and fluid balance  Outcome: Progressing  Flowsheets (Taken 12/02/2022 0215)  Stable vital signs and fluid balance:   Assess signs and symptoms associated with cardiac rhythm changes   Monitor lab values     Problem: Inadequate Tissue Perfusion  Goal: Adequate tissue perfusion will be maintained  Outcome: Progressing  Flowsheets (Taken 12/02/2022 0215)  Adequate tissue perfusion will be maintained:   Monitor/assess lab values and report abnormal values   Monitor/assess neurovascular status (pulses, capillary refill, pain, paresthesia, paralysis, presence of edema)   Monitor/assess for signs of VTE (edema of calf/thigh redness, pain)   Monitor for signs and symptoms of a pulmonary embolism (dyspnea, tachypnea, tachycardia, confusion)   Encourage/assist patient as needed to turn, cough, and perform deep breathing every 2 hours     Problem: Fluid and Electrolyte Imbalance/ Endocrine  Goal: Fluid and electrolyte balance are achieved/maintained  Outcome: Progressing  Flowsheets (Taken 12/01/2022 0249)  Fluid and electrolyte balance are achieved/maintained:   Monitor/assess lab values and report abnormal values   Observe for cardiac arrhythmias   Monitor  for muscle weakness   Assess and reassess fluid and electrolyte status     Problem: Diabetes: Glucose Imbalance  Goal: Blood glucose stable at established goal  Outcome: Progressing  Flowsheets (Taken 12/01/2022 0249)  Blood glucose stable at established goal:   Monitor lab values   Include patient/family in decisions related to nutrition/dietary selections   Monitor/assess vital signs   Ensure appropriate diet and assess tolerance   Ensure appropriate consults are obtained (Nutrition, Diabetes Education, and Case Management)   Ensure patient/family has adequate teaching materials   Coordinate medication administration with meals, as indicated   Ensure adequate hydration   Assess for hypoglycemia /hyperglycemia   Monitor intake and output.  Notify LIP if urine output is < 30 mL/hour.   Follow fluid restrictions/IV/PO parameters     Problem: Inadequate Gas Exchange  Goal: Adequate oxygenation and improved ventilation  Outcome: Progressing  Flowsheets (Taken 12/02/2022 0215)  Adequate oxygenation and improved ventilation:   Assess lung sounds   Monitor SpO2 and treat as needed   Teach/reinforce use of incentive spirometer 10 times per hour while awake, cough and deep breath as needed   Position for maximum ventilatory efficiency   Plan activities to conserve energy: plan rest periods   Increase activity as tolerated/progressive mobility   Consult/collaborate with Respiratory Therapy     Problem: Safety  Goal: Patient will be free from injury during hospitalization  Outcome: Progressing  Flowsheets (Taken 12/02/2022 0215)  Patient will be free from injury during hospitalization:   Assess patient's risk for falls and implement fall prevention plan of care per policy   Use appropriate transfer methods   Include patient/ family/ care  giver in decisions related to safety   Assess for patients risk for elopement and implement Elopement Risk Plan per policy   Ensure appropriate safety devices are available at the bedside    Provide and maintain safe environment   Provide alternative method of communication if needed (communication boards, writing)     Problem: Side Effects from Pain Analgesia  Goal: Patient will experience minimal side effects of analgesic therapy  Outcome: Progressing  Flowsheets (Taken 12/01/2022 0249)  Patient will experience minimal side effects of analgesic therapy:   Monitor/assess patient's respiratory status (RR depth, effort, breath sounds)   Prevent/manage side effects per LIP orders (i.e. nausea, vomiting, pruritus, constipation, urinary retention, etc.)   Evaluate for opioid-induced sedation with appropriate assessment tool (i.e. POSS)   Assess for changes in cognitive function     Problem: Pain interferes with ability to perform ADL  Goal: Pain at adequate level as identified by patient  Outcome: Progressing  Flowsheets (Taken 12/02/2022 0215)  Pain at adequate level as identified by patient:   Identify patient comfort function goal   Assess for risk of opioid induced respiratory depression, including snoring/sleep apnea. Alert healthcare team of risk factors identified.   Assess pain on admission, during daily assessment and/or before any "as needed" intervention(s)   Reassess pain within 30-60 minutes of any procedure/intervention, per Pain Assessment, Intervention, Reassessment (AIR) Cycle   Evaluate if patient comfort function goal is met   Offer non-pharmacological pain management interventions   Consult/collaborate with Physical Therapy, Occupational Therapy, and/or Speech Therapy   Include patient/patient care companion in decisions related to pain management as needed   Evaluate patient's satisfaction with pain management progress     Problem: Everyday - Heart Failure  Goal: Stable Vital Signs and Fluid Balance  Outcome: Progressing  Flowsheets (Taken 12/01/2022 0249)  Stable Vital Signs and Fluid Balance:   Fluid Restriction   Monitor, assess vital signs and telemetry per policy   Daily Standing  Weights in the morning using the same scale, after using the bathroom and before breadfast.  If unable to stand, zero the bed and use the bed scale   Monitor labs and report abnormalities to physician   Wean oxygen as needed if appropriate   Assess for swelling/edema   Strict Intake/Output  Goal: Mobility/Activity is Maintained at Optimal Level for Patient  Outcome: Progressing  Flowsheets (Taken 12/02/2022 0215)  Mobility/Activity is Maintained at Optimal Level for Patient:   Increase mobility as tolerated/progressive mobility protocol   Reposition patient every 2 hours and as needed unless able to reposition self   Assess for changes in respiratory status, level of consciousness and/or development of fatigue   Consult with Physical Therapy and/or Occupational Therapy   Maintain SCD's as Ordered   Perform active/passive ROM

## 2022-12-02 NOTE — Progress Notes (Signed)
Pt D/C home with no needs. The Pt's daughter will be transporting her home. Bedside and charge nurse have been notified.        12/02/22 1445   Discharge Disposition   Patient preference/choice provided? Yes   Physical Discharge Disposition Home   Mode of Transportation Car   Patient/Family/POA notified of transfer plan Yes;Patient informed only   Patient agreeable to discharge plan/expected d/c date? Yes   Family/POA agreeable to discharge plan/expected d/c date? Yes   Bedside nurse notified of transport plan? Yes     Kemoni Ortega,MSW,Supervisee in Sales promotion account executive I

## 2022-12-02 NOTE — Discharge Instr - AVS First Page (Addendum)
Reason for your Hospital Admission:  Acute Asthma Exacerbation  Acute Bronchitis       Instructions for after your discharge:  I added Symbicort to your regimen.  Make sure to use it 2 times daily regularly.    For acute shortness of breath use albuterol as needed.    No changes in your other medications.    Make sure you follow-up with your primary care doctor in 1 week.    No antibiotics on discharge.

## 2022-12-11 ENCOUNTER — Observation Stay
Admission: EM | Admit: 2022-12-11 | Discharge: 2022-12-13 | Disposition: A | Discharge status: Home / Self Care | Payer: No Typology Code available for payment source | Attending: Internal Medicine | Admitting: Internal Medicine

## 2022-12-11 ENCOUNTER — Emergency Department: Payer: No Typology Code available for payment source

## 2022-12-11 DIAGNOSIS — R739 Hyperglycemia, unspecified: Secondary | ICD-10-CM

## 2022-12-11 DIAGNOSIS — N184 Chronic kidney disease, stage 4 (severe): Secondary | ICD-10-CM | POA: Insufficient documentation

## 2022-12-11 DIAGNOSIS — R7989 Other specified abnormal findings of blood chemistry: Secondary | ICD-10-CM

## 2022-12-11 DIAGNOSIS — E1122 Type 2 diabetes mellitus with diabetic chronic kidney disease: Secondary | ICD-10-CM | POA: Insufficient documentation

## 2022-12-11 DIAGNOSIS — I5033 Acute on chronic diastolic (congestive) heart failure: Secondary | ICD-10-CM | POA: Insufficient documentation

## 2022-12-11 DIAGNOSIS — Z86718 Personal history of other venous thrombosis and embolism: Secondary | ICD-10-CM | POA: Insufficient documentation

## 2022-12-11 DIAGNOSIS — R0682 Tachypnea, not elsewhere classified: Secondary | ICD-10-CM | POA: Insufficient documentation

## 2022-12-11 DIAGNOSIS — E662 Morbid (severe) obesity with alveolar hypoventilation: Secondary | ICD-10-CM | POA: Insufficient documentation

## 2022-12-11 DIAGNOSIS — Z91148 Patient's other noncompliance with medication regimen for other reason: Secondary | ICD-10-CM | POA: Insufficient documentation

## 2022-12-11 DIAGNOSIS — Z7901 Long term (current) use of anticoagulants: Secondary | ICD-10-CM | POA: Insufficient documentation

## 2022-12-11 DIAGNOSIS — I160 Hypertensive urgency: Secondary | ICD-10-CM

## 2022-12-11 DIAGNOSIS — I2489 Other forms of acute ischemic heart disease: Secondary | ICD-10-CM

## 2022-12-11 DIAGNOSIS — E1165 Type 2 diabetes mellitus with hyperglycemia: Secondary | ICD-10-CM | POA: Insufficient documentation

## 2022-12-11 DIAGNOSIS — E785 Hyperlipidemia, unspecified: Secondary | ICD-10-CM | POA: Insufficient documentation

## 2022-12-11 DIAGNOSIS — D72829 Elevated white blood cell count, unspecified: Secondary | ICD-10-CM | POA: Insufficient documentation

## 2022-12-11 DIAGNOSIS — Z794 Long term (current) use of insulin: Secondary | ICD-10-CM | POA: Insufficient documentation

## 2022-12-11 DIAGNOSIS — R0789 Other chest pain: Secondary | ICD-10-CM | POA: Diagnosis present

## 2022-12-11 DIAGNOSIS — I130 Hypertensive heart and chronic kidney disease with heart failure and stage 1 through stage 4 chronic kidney disease, or unspecified chronic kidney disease: Secondary | ICD-10-CM | POA: Insufficient documentation

## 2022-12-11 DIAGNOSIS — I509 Heart failure, unspecified: Secondary | ICD-10-CM

## 2022-12-11 DIAGNOSIS — I161 Hypertensive emergency: Principal | ICD-10-CM | POA: Insufficient documentation

## 2022-12-11 DIAGNOSIS — R600 Localized edema: Secondary | ICD-10-CM

## 2022-12-11 DIAGNOSIS — Z6841 Body Mass Index (BMI) 40.0 and over, adult: Secondary | ICD-10-CM | POA: Insufficient documentation

## 2022-12-11 DIAGNOSIS — J4551 Severe persistent asthma with (acute) exacerbation: Secondary | ICD-10-CM | POA: Insufficient documentation

## 2022-12-11 DIAGNOSIS — Z86711 Personal history of pulmonary embolism: Secondary | ICD-10-CM | POA: Insufficient documentation

## 2022-12-11 DIAGNOSIS — D638 Anemia in other chronic diseases classified elsewhere: Secondary | ICD-10-CM | POA: Insufficient documentation

## 2022-12-11 LAB — CBC AND DIFFERENTIAL
Absolute NRBC: 0 10*3/uL (ref 0.00–0.00)
Basophils Absolute Automated: 0.05 10*3/uL (ref 0.00–0.08)
Basophils Automated: 0.4 %
Eosinophils Absolute Automated: 0.33 10*3/uL (ref 0.00–0.44)
Eosinophils Automated: 2.5 %
Hematocrit: 29.3 % — ABNORMAL LOW (ref 34.7–43.7)
Hgb: 8.5 g/dL — ABNORMAL LOW (ref 11.4–14.8)
Immature Granulocytes Absolute: 0.1 10*3/uL — ABNORMAL HIGH (ref 0.00–0.07)
Immature Granulocytes: 0.7 %
Instrument Absolute Neutrophil Count: 9.55 10*3/uL — ABNORMAL HIGH (ref 1.10–6.33)
Lymphocytes Absolute Automated: 2.55 10*3/uL (ref 0.42–3.22)
Lymphocytes Automated: 19.1 %
MCH: 25.4 pg (ref 25.1–33.5)
MCHC: 29 g/dL — ABNORMAL LOW (ref 31.5–35.8)
MCV: 87.5 fL (ref 78.0–96.0)
MPV: 11.4 fL (ref 8.9–12.5)
Monocytes Absolute Automated: 0.77 10*3/uL (ref 0.21–0.85)
Monocytes: 5.8 %
Neutrophils Absolute: 9.55 10*3/uL — ABNORMAL HIGH (ref 1.10–6.33)
Neutrophils: 71.5 %
Nucleated RBC: 0 /100 WBC (ref 0.0–0.0)
Platelets: 391 10*3/uL — ABNORMAL HIGH (ref 142–346)
RBC: 3.35 10*6/uL — ABNORMAL LOW (ref 3.90–5.10)
RDW: 15 % (ref 11–15)
WBC: 13.35 10*3/uL — ABNORMAL HIGH (ref 3.10–9.50)

## 2022-12-11 LAB — COMPREHENSIVE METABOLIC PANEL
ALT: 10 U/L (ref 0–55)
AST (SGOT): 9 U/L (ref 5–41)
Albumin/Globulin Ratio: 0.7 — ABNORMAL LOW (ref 0.9–2.2)
Albumin: 2.5 g/dL — ABNORMAL LOW (ref 3.5–5.0)
Alkaline Phosphatase: 107 U/L (ref 37–117)
Anion Gap: 9 (ref 5.0–15.0)
BUN: 43 mg/dL — ABNORMAL HIGH (ref 7.0–21.0)
Bilirubin, Total: 0.2 mg/dL (ref 0.2–1.2)
CO2: 20 mEq/L (ref 17–29)
Calcium: 7.5 mg/dL — ABNORMAL LOW (ref 8.5–10.5)
Chloride: 106 mEq/L (ref 99–111)
Creatinine: 2.7 mg/dL — ABNORMAL HIGH (ref 0.4–1.0)
Globulin: 3.5 g/dL (ref 2.0–3.6)
Glucose: 383 mg/dL — ABNORMAL HIGH (ref 70–100)
Potassium: 3.9 mEq/L (ref 3.5–5.3)
Protein, Total: 6 g/dL (ref 6.0–8.3)
Sodium: 135 mEq/L (ref 135–145)
eGFR: 21.7 mL/min/{1.73_m2} — AB (ref >=60)

## 2022-12-11 LAB — NT-PROBNP: NT-proBNP: 2288 pg/mL — ABNORMAL HIGH (ref 0–125)

## 2022-12-11 LAB — COVID-19 (SARS-COV-2) & INFLUENZA  A/B, NAA (ROCHE LIAT)
Influenza A: NOT DETECTED
Influenza B: NOT DETECTED
SARS-CoV-2 Overall Result: NOT DETECTED

## 2022-12-11 LAB — HIGH SENSITIVITY TROPONIN-I: hs Troponin-I: 14 ng/L — AB

## 2022-12-11 LAB — WHOLE BLOOD GLUCOSE POCT: Whole Blood Glucose POCT: 273 mg/dL — ABNORMAL HIGH (ref 70–100)

## 2022-12-11 MED ORDER — MAGNESIUM SULFATE IN D5W 1-5 GM/100ML-% IV SOLN
1.0000 g | INTRAVENOUS | Status: DC | PRN
Start: 2022-12-11 — End: 2022-12-11

## 2022-12-11 MED ORDER — BENZONATATE 100 MG PO CAPS
100.0000 mg | ORAL_CAPSULE | Freq: Three times a day (TID) | ORAL | Status: DC | PRN
Start: 2022-12-11 — End: 2022-12-13

## 2022-12-11 MED ORDER — ASPIRIN 81 MG PO CHEW
81.0000 mg | CHEWABLE_TABLET | Freq: Every day | ORAL | Status: DC
Start: 2022-12-12 — End: 2022-12-13
  Administered 2022-12-12 – 2022-12-13 (×2): 81 mg via ORAL
  Filled 2022-12-11 (×2): qty 1

## 2022-12-11 MED ORDER — ATORVASTATIN CALCIUM 80 MG PO TABS
80.0000 mg | ORAL_TABLET | Freq: Every evening | ORAL | Status: DC
Start: 2022-12-11 — End: 2022-12-13
  Administered 2022-12-11 – 2022-12-12 (×2): 80 mg via ORAL
  Filled 2022-12-11 (×2): qty 1

## 2022-12-11 MED ORDER — POTASSIUM CHLORIDE 10 MEQ/100ML IV SOLN
10.0000 meq | INTRAVENOUS | Status: DC | PRN
Start: 2022-12-11 — End: 2022-12-11

## 2022-12-11 MED ORDER — INSULIN LISPRO 100 UNIT/ML SOLN (WRAP)
1.0000 [IU] | Freq: Every evening | Status: DC
Start: 2022-12-11 — End: 2022-12-13
  Administered 2022-12-11: 2 [IU] via SUBCUTANEOUS
  Administered 2022-12-12: 1 [IU] via SUBCUTANEOUS
  Filled 2022-12-11: qty 6
  Filled 2022-12-11: qty 3

## 2022-12-11 MED ORDER — FUROSEMIDE 10 MG/ML IJ SOLN
80.0000 mg | Freq: Once | INTRAMUSCULAR | Status: AC
Start: 2022-12-11 — End: 2022-12-11
  Administered 2022-12-11: 80 mg via INTRAVENOUS
  Filled 2022-12-11 (×2): qty 8

## 2022-12-11 MED ORDER — PREGABALIN 75 MG PO CAPS
150.0000 mg | ORAL_CAPSULE | Freq: Two times a day (BID) | ORAL | Status: DC
Start: 2022-12-11 — End: 2022-12-13
  Administered 2022-12-11 – 2022-12-13 (×4): 150 mg via ORAL
  Filled 2022-12-11 (×4): qty 2

## 2022-12-11 MED ORDER — SERTRALINE HCL 50 MG PO TABS
50.0000 mg | ORAL_TABLET | Freq: Every day | ORAL | Status: DC
Start: 2022-12-12 — End: 2022-12-13
  Administered 2022-12-12 – 2022-12-13 (×2): 50 mg via ORAL
  Filled 2022-12-11 (×2): qty 1

## 2022-12-11 MED ORDER — GLUCAGON 1 MG IJ SOLR (WRAP)
1.0000 mg | INTRAMUSCULAR | Status: DC | PRN
Start: 2022-12-11 — End: 2022-12-13

## 2022-12-11 MED ORDER — ALBUTEROL-IPRATROPIUM 2.5-0.5 (3) MG/3ML IN SOLN
3.0000 mL | Freq: Once | RESPIRATORY_TRACT | Status: AC
Start: 2022-12-11 — End: 2022-12-11
  Administered 2022-12-11: 3 mL via RESPIRATORY_TRACT
  Filled 2022-12-11: qty 3

## 2022-12-11 MED ORDER — ACETAMINOPHEN 325 MG PO TABS
650.0000 mg | ORAL_TABLET | Freq: Four times a day (QID) | ORAL | Status: DC | PRN
Start: 2022-12-11 — End: 2022-12-11

## 2022-12-11 MED ORDER — ACETAMINOPHEN 325 MG PO TABS
650.0000 mg | ORAL_TABLET | Freq: Four times a day (QID) | ORAL | Status: DC | PRN
Start: 2022-12-11 — End: 2022-12-13
  Administered 2022-12-11 – 2022-12-12 (×3): 650 mg via ORAL
  Filled 2022-12-11 (×3): qty 2

## 2022-12-11 MED ORDER — INSULIN LISPRO 100 UNIT/ML SOLN (WRAP)
1.0000 [IU] | Freq: Three times a day (TID) | Status: DC
Start: 2022-12-12 — End: 2022-12-13
  Administered 2022-12-12 (×2): 2 [IU] via SUBCUTANEOUS
  Administered 2022-12-12: 1 [IU] via SUBCUTANEOUS
  Administered 2022-12-13: 2 [IU] via SUBCUTANEOUS
  Administered 2022-12-13: 1 [IU] via SUBCUTANEOUS
  Administered 2022-12-13: 2 [IU] via SUBCUTANEOUS
  Filled 2022-12-11 (×4): qty 6
  Filled 2022-12-11 (×2): qty 3

## 2022-12-11 MED ORDER — FOLIC ACID 1 MG PO TABS
1.0000 mg | ORAL_TABLET | Freq: Every day | ORAL | Status: DC
Start: 2022-12-12 — End: 2022-12-13
  Administered 2022-12-12 – 2022-12-13 (×2): 1 mg via ORAL
  Filled 2022-12-11 (×2): qty 1

## 2022-12-11 MED ORDER — DEXTROSE 50 % IV SOLN
12.5000 g | INTRAVENOUS | Status: DC | PRN
Start: 2022-12-11 — End: 2022-12-13

## 2022-12-11 MED ORDER — BUMETANIDE 1 MG PO TABS
2.0000 mg | ORAL_TABLET | Freq: Two times a day (BID) | ORAL | Status: DC
Start: 2022-12-11 — End: 2022-12-13
  Administered 2022-12-11 – 2022-12-13 (×5): 2 mg via ORAL
  Filled 2022-12-11 (×5): qty 2

## 2022-12-11 MED ORDER — GLUCOSE 40 % PO GEL (WRAP)
15.0000 g | ORAL | Status: DC | PRN
Start: 2022-12-11 — End: 2022-12-13

## 2022-12-11 MED ORDER — BUDESONIDE-FORMOTEROL FUMARATE 80-4.5 MCG/ACT IN AERO
2.0000 | INHALATION_SPRAY | Freq: Two times a day (BID) | RESPIRATORY_TRACT | Status: DC
Start: 2022-12-11 — End: 2022-12-13
  Administered 2022-12-12 – 2022-12-13 (×3): 2 via RESPIRATORY_TRACT
  Filled 2022-12-11: qty 6.9

## 2022-12-11 MED ORDER — TRAZODONE HCL 50 MG PO TABS
50.0000 mg | ORAL_TABLET | Freq: Every evening | ORAL | Status: DC
Start: 2022-12-11 — End: 2022-12-13
  Administered 2022-12-11 – 2022-12-12 (×2): 50 mg via ORAL
  Filled 2022-12-11 (×2): qty 1

## 2022-12-11 MED ORDER — ASPIRIN 81 MG PO CHEW
324.0000 mg | CHEWABLE_TABLET | Freq: Once | ORAL | Status: AC
Start: 2022-12-11 — End: 2022-12-11
  Administered 2022-12-11: 324 mg via ORAL
  Filled 2022-12-11: qty 4

## 2022-12-11 MED ORDER — SODIUM BICARBONATE 650 MG PO TABS
650.0000 mg | ORAL_TABLET | Freq: Two times a day (BID) | ORAL | Status: DC
Start: 2022-12-11 — End: 2022-12-13
  Administered 2022-12-11 – 2022-12-13 (×5): 650 mg via ORAL
  Filled 2022-12-11 (×5): qty 1

## 2022-12-11 MED ORDER — POTASSIUM CHLORIDE 20 MEQ PO PACK
60.0000 meq | PACK | Freq: Once | ORAL | Status: AC
Start: 2022-12-11 — End: 2022-12-11
  Administered 2022-12-11: 60 meq via ORAL
  Filled 2022-12-11: qty 3

## 2022-12-11 MED ORDER — INSULIN GLARGINE 100 UNIT/ML SC SOLN
20.0000 [IU] | Freq: Two times a day (BID) | SUBCUTANEOUS | Status: DC
Start: 2022-12-11 — End: 2022-12-12
  Administered 2022-12-11: 20 [IU] via SUBCUTANEOUS
  Filled 2022-12-11: qty 20

## 2022-12-11 MED ORDER — APIXABAN 5 MG PO TABS
5.0000 mg | ORAL_TABLET | Freq: Two times a day (BID) | ORAL | Status: DC
Start: 2022-12-11 — End: 2022-12-13
  Administered 2022-12-11 – 2022-12-13 (×4): 5 mg via ORAL
  Filled 2022-12-11 (×4): qty 1

## 2022-12-11 MED ORDER — DOCUSATE SODIUM 100 MG PO CAPS
100.0000 mg | ORAL_CAPSULE | Freq: Every evening | ORAL | Status: DC
Start: 2022-12-11 — End: 2022-12-13
  Administered 2022-12-11 – 2022-12-12 (×2): 100 mg via ORAL
  Filled 2022-12-11 (×2): qty 1

## 2022-12-11 MED ORDER — MONTELUKAST SODIUM 10 MG PO TABS
10.0000 mg | ORAL_TABLET | Freq: Every evening | ORAL | Status: DC
Start: 2022-12-11 — End: 2022-12-13
  Administered 2022-12-11 – 2022-12-12 (×2): 10 mg via ORAL
  Filled 2022-12-11 (×2): qty 1

## 2022-12-11 MED ORDER — HYDRALAZINE HCL 20 MG/ML IJ SOLN
10.0000 mg | Freq: Once | INTRAMUSCULAR | Status: AC
Start: 2022-12-11 — End: 2022-12-11
  Administered 2022-12-11: 10 mg via INTRAVENOUS
  Filled 2022-12-11: qty 1

## 2022-12-11 MED ORDER — ALBUTEROL SULFATE (2.5 MG/3ML) 0.083% IN NEBU
10.0000 mg | INHALATION_SOLUTION | Freq: Once | RESPIRATORY_TRACT | Status: AC
Start: 2022-12-11 — End: 2022-12-11
  Administered 2022-12-11: 10 mg via RESPIRATORY_TRACT
  Filled 2022-12-11: qty 12

## 2022-12-11 MED ORDER — POTASSIUM CHLORIDE CRYS ER 20 MEQ PO TBCR
0.0000 meq | EXTENDED_RELEASE_TABLET | ORAL | Status: DC | PRN
Start: 2022-12-11 — End: 2022-12-11

## 2022-12-11 MED ORDER — NALOXONE HCL 0.4 MG/ML IJ SOLN (WRAP)
0.2000 mg | INTRAMUSCULAR | Status: DC | PRN
Start: 2022-12-11 — End: 2022-12-13

## 2022-12-11 MED ORDER — DEXTROSE 10 % IV BOLUS
12.5000 g | INTRAVENOUS | Status: DC | PRN
Start: 2022-12-11 — End: 2022-12-13

## 2022-12-11 MED ORDER — DULOXETINE HCL 20 MG PO CPEP
20.0000 mg | ORAL_CAPSULE | Freq: Every day | ORAL | Status: DC
Start: 2022-12-12 — End: 2022-12-13
  Administered 2022-12-12 – 2022-12-13 (×2): 20 mg via ORAL
  Filled 2022-12-11 (×2): qty 1

## 2022-12-11 MED ORDER — MELATONIN 3 MG PO TABS
3.0000 mg | ORAL_TABLET | Freq: Every evening | ORAL | Status: DC | PRN
Start: 2022-12-11 — End: 2022-12-13
  Administered 2022-12-11 – 2022-12-12 (×2): 3 mg via ORAL
  Filled 2022-12-11 (×2): qty 1

## 2022-12-11 MED ORDER — POTASSIUM & SODIUM PHOSPHATES 280-160-250 MG PO PACK
2.0000 | PACK | ORAL | Status: DC | PRN
Start: 2022-12-11 — End: 2022-12-11

## 2022-12-11 MED ORDER — ONDANSETRON HCL 4 MG/2ML IJ SOLN
4.0000 mg | Freq: Four times a day (QID) | INTRAMUSCULAR | Status: DC | PRN
Start: 2022-12-11 — End: 2022-12-13
  Administered 2022-12-11: 4 mg via INTRAVENOUS
  Filled 2022-12-11: qty 2

## 2022-12-11 MED ORDER — CARVEDILOL 25 MG PO TABS
25.0000 mg | ORAL_TABLET | Freq: Two times a day (BID) | ORAL | Status: DC
Start: 2022-12-11 — End: 2022-12-13
  Administered 2022-12-11 – 2022-12-13 (×4): 25 mg via ORAL
  Filled 2022-12-11 (×4): qty 1

## 2022-12-11 MED ORDER — CARBOXYMETHYLCELLULOSE SODIUM 0.5 % OP SOLN
1.0000 [drp] | Freq: Three times a day (TID) | OPHTHALMIC | Status: DC | PRN
Start: 2022-12-11 — End: 2022-12-13

## 2022-12-11 MED ORDER — NITROGLYCERIN 0.4 MG SL SUBL
0.4000 mg | SUBLINGUAL_TABLET | SUBLINGUAL | Status: DC | PRN
Start: 2022-12-11 — End: 2022-12-13

## 2022-12-11 MED ORDER — ALBUTEROL SULFATE (2.5 MG/3ML) 0.083% IN NEBU
5.0000 mg | INHALATION_SOLUTION | Freq: Once | RESPIRATORY_TRACT | Status: AC
Start: 2022-12-11 — End: 2022-12-11
  Administered 2022-12-11: 5 mg via RESPIRATORY_TRACT
  Filled 2022-12-11: qty 6

## 2022-12-11 MED ORDER — SALINE SPRAY 0.65 % NA SOLN
2.0000 | NASAL | Status: DC | PRN
Start: 2022-12-11 — End: 2022-12-13

## 2022-12-11 MED ORDER — ARIPIPRAZOLE 10 MG PO TABS
10.0000 mg | ORAL_TABLET | Freq: Every day | ORAL | Status: DC
Start: 2022-12-12 — End: 2022-12-13
  Administered 2022-12-12 – 2022-12-13 (×2): 10 mg via ORAL
  Filled 2022-12-11 (×2): qty 1

## 2022-12-11 MED ORDER — HYDRALAZINE HCL 20 MG/ML IJ SOLN
20.0000 mg | Freq: Once | INTRAMUSCULAR | Status: AC
Start: 2022-12-11 — End: 2022-12-11
  Administered 2022-12-11: 20 mg via INTRAVENOUS
  Filled 2022-12-11: qty 1

## 2022-12-11 MED ORDER — BENZOCAINE-MENTHOL MT LOZG (WRAP)
1.0000 | LOZENGE | OROMUCOSAL | Status: DC | PRN
Start: 2022-12-11 — End: 2022-12-13

## 2022-12-11 MED ORDER — ALBUTEROL SULFATE (2.5 MG/3ML) 0.083% IN NEBU
2.5000 mg | INHALATION_SOLUTION | Freq: Four times a day (QID) | RESPIRATORY_TRACT | Status: DC | PRN
Start: 2022-12-11 — End: 2022-12-13
  Administered 2022-12-11: 2.5 mg via RESPIRATORY_TRACT
  Filled 2022-12-11: qty 3

## 2022-12-11 MED ORDER — HYDRALAZINE HCL 20 MG/ML IJ SOLN
10.0000 mg | Freq: Once | INTRAMUSCULAR | Status: DC
Start: 2022-12-11 — End: 2022-12-12

## 2022-12-11 NOTE — ED Triage Notes (Signed)
Pt ambulates to triage with C/O SOB x 2 days, was seen here in [place] last week for edema and SOB. Hx Stage 4 kidney disease. Pt Hx asthma, used rescue inhaler today. had hyper K last visit.

## 2022-12-11 NOTE — H&P (Signed)
[facility]      Patient: [name]  Date: 12/11/2022   DOB: Mar 25, 1981  Date of Admission: 12/11/2022   MRN: [id number]  Attending: Candice Camp, DNP NP         Chief Complaint   Patient presents with    Shortness of Breath      History Gathered From: patient    HISTORY AND PHYSICAL     [name] is a 42 y.o. 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 She was previously on dulera, but states it was not effective and she believes symbicort has given her the most relief from symptoms. Work up in the ED was significant for Cr and anemia at baseline. Trop midly elevated ( 14) and BNP >2200. CXR without acute findings. EKG without ischemia. She required multiple doses of hydralazine and lasix in the ED in order to control her blood pressure as well as albuterol nebs for wheezing. She is admitted for observation to hospitalist service.    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 kr    CESAREAN SECTION      x 2    CHOLECYSTECTOMY      EYE SURGERY Bilateral     cataract    HYSTERECTOMY  12/2019    RELEASE, CARPAL TUNNEL Left 07/10/2022    Procedure: RELEASE, CARPAL TUNNEL;  Surgeon: Romilda Joy, MD;  Location: [facility];  Service: Plastics;  Laterality: Left;    REPAIR, UPPER EXTREMITY, TENDON & NERVE Right 12/05/2021    Procedure: REPAIR, RIGHT ULNAR NERVE AND NEUROPLASTY @ ELBOW AND WRIST, RIGHT OPEN CARPAL TUNNEL RELEASE;  Surgeon: Romilda Joy, MD;  Location: [facility];  Service: Plastics;  Laterality: Right;    REPAIR, UPPER EXTREMITY, TENDON & NERVE Left 07/10/2022    Procedure: LEFT ULNAR NERVE NEUROPLASTY AT ELBOW AND WRIST, LEFT OPEN CARPAL TUNNEL RELEASE;  Surgeon: Romilda Joy, MD;  Location: [facility];  Service: Plastics;  Laterality: Left;    UTERINE RUPTURE         Prior to Admission medications    Medication Sig Start Date End Date Taking? Authorizing Provider   albuterol sulfate HFA (PROVENTIL) 108 (90 Base) MCG/ACT inhaler Inhale 2 puffs into the lungs every 4 (four) hours as needed for Wheezing 03/18/21   Beola Cord, PA   Alcohol Swabs Pads Use as indicated for insulin injections 08/13/20   Forest Becker, MD   apixaban (ELIQUIS) 5 MG Take 1 tablet (5 mg) by mouth every 12 (twelve) hours    [provider]   ARIPiprazole (ABILIFY) 5 MG tablet Take 2 tablets (10 mg) by mouth daily 09/24/21   [provider]   aspirin 81 MG chewable tablet Chew 1 tablet (81 mg) by mouth daily 12/25/20   [provider]   atorvastatin (LIPITOR) 40 MG tablet Take 2 tablets (80 mg) by mouth nightly 09/05/21   [provider]   budesonide-formoterol (SYMBICORT) 80-4.5 MCG/ACT inhaler Inhale 2 puffs into the lungs 2 (two) times daily 12/02/22   Maricela Bo, MD   bumetanide (BUMEX) 2 MG tablet Take 1 tablet  (2 mg total) by mouth 2 (two) times daily 08/21/20   Lisette Grinder, NP   carvedilol (COREG) 25 MG tablet Take 1 tablet (25 mg) by mouth 2 (two) times daily    [provider]   diclofenac Sodium (VOLTAREN) 1 % Gel topical gel Apply 2 g topically 4 (four) times daily 02/27/22   Rochel Brome, DO   docusate sodium (COLACE) 100 MG capsule Take 1 capsule (100 mg) by mouth nightly 09/05/21   [provider]   DULoxetine (CYMBALTA) 20 MG capsule Take 1 capsule (20 mg) by mouth daily 09/24/21   [provider]   EPINEPHrine 0.15 MG/0.3ML injection Inject 0.3 mLs (0.15 mg) into the muscle as needed 09/05/21   [provider]   Fluticasone Propionate, Inhal, (Fluticasone Propionate Diskus) 50 MCG/ACT Aerosol Pwdr, Breath Activated Inhale into the lungs    [provider]   folic acid (FOLVITE) 1 MG tablet Take 1 tablet (1 mg) by mouth daily    [provider]   insulin detemir (Levemir) 100 UNIT/ML injection Inject 50 Units into the skin every 12 (twelve) hours  Patient taking differently: Inject 53 Units into the skin every 12 (twelve) hours 08/13/20   Forest Becker, MD   insulin lispro (HumaLOG) 100 UNIT/ML injection Inject 25 Units into the skin 3 (three) times daily before meals  Patient taking differently: Inject 25 Units into the skin 3 (three) times daily before meals 08/13/20   Forest Becker, MD   Insulin Syringe-Needle U-100 27G X 1/2" 1 ML Misc Inject 1 Dose into the skin 4 (four) times daily 08/13/20   Forest Becker, MD   montelukast (SINGULAIR) 10 MG tablet Take 1 tablet (10 mg) by mouth nightly    [provider]   olmesartan (BENICAR) 40 MG tablet Take 1 tablet (40 mg) by mouth daily    [provider]   pregabalin (LYRICA) 150 MG capsule Take 1 capsule (150 mg) by mouth 2 (two) times daily    [provider]   sertraline (ZOLOFT) 50 MG tablet Take 1 tablet (50 mg) by mouth daily    [provider]   sodium  bicarbonate 650 MG tablet Take 1 tablet (650 mg) by mouth 2 (two) times daily  [provider]   traZODone (DESYREL) 50 MG tablet Take 1 tablet (50 mg) by mouth nightly    [provider]   semaglutide (WEGOVY) 1.7 MG/0.75ML injection Inject 0.5 mLs (1 mg) into the skin once a week  Patient not taking: Reported on 07/02/2022  12/11/22  [provider]       Allergies   Allergen Reactions    Amlodipine Hives    Bactrim [Sulfamethoxazole-Trimethoprim] Hives, Respiratory Distress and Edema    Glipizide Edema     Throat swelling    Lisinopril Edema     Tongue swelling    Losartan Other (See Comments), Swelling and Edema       Family History   Problem Relation Age of Onset    Heart disease Mother     Hypertension Mother     Diabetes Mother     Asthma Father     Hypertension Father     Diabetes Daughter     Asthma Son     Cancer Maternal Aunt     Cancer Maternal Grandmother        Social History     Tobacco Use    Smoking status: Never    Smokeless tobacco: Never   Vaping Use    Vaping Use: Never used   Substance Use Topics    Alcohol use: Never    Drug use: Never     REVIEW OF SYSTEMS   Gen: No fever, chills, malaise, fatigue  HEENT: No vision changes, runny nose, sore throat, difficulty swallowing, or ear pain  Neck: No neck stiffness  Resp: + cough (chronic), shortness of breath, & wheezing. Not on home oxygen.  Cardio: + chest heaviness (chronic). No palpitations, edema, orthopnea, or PND  Abd: No abdominal pain, nausea, vomiting, diarrhea, constipation, changes in bowel habits, melena, hematochezia, or hematemesis  GU: no dysuria, frequency, urgency, hematuria  MSK: No unusual back pain or joint pain  Derm: No rashes or itching  Neuro: No headache, confusion, seizures, syncope, or speech changes  Heme/Lymph: No easy bruising or bleeding tendencies. No swollen lymph nodes  Endo: No heat or cold intolerance. Polydypsia or polyuria  Psych: No acute depression or anxiety.    PHYSICAL EXAM    Vital Signs (most recent): BP 186/82   Pulse (!) 106   Temp 98.2 F (36.8 C) (Oral)   Resp 20   Wt 145 kg (319 lb 10.7 oz)   SpO2 100%   BMI 51.60 kg/m   Constitutional: No apparent distress, Neatly groomed and appears stated age. Patient speaks freely in full sentences.   HEENT: normocephalic, atraumatic, PERRL, no scleral icterus, no conjunctival pallor, no nasal discharge  Neck: trachea midline, supple, no JVD  Cardiovascular: RRR, normal S1 S2  Respiratory: Normal rate. No increased work of breathing. Diminished bilateral bases. Not on oxygen.  Gastrointestinal: +BS, non-distended, soft, non-tender  Genitourinary: no foley catheter, no bladder distension, no CVA tenderness  Musculoskeletal: ROM and motor strength grossly normal  Ext: trace edema BLE, pulses 2+/equal bilaterally  Neurologic: alert, oriented x 4, no focal deficits  Skin: normal color, warm, dry  Psychiatric: calm, cooperative, appropriate mood/affect    LABS & IMAGING     Recent Results (from the past 24 hour(s))   Comprehensive metabolic panel    Collection Time: 12/11/22  5:49 PM   Result Value Ref Range    Glucose 383 (H) 70 - 100 mg/dL    BUN 16.1 (H) 7.0 - 09.6 mg/dL  Creatinine 2.7 (H) 0.4 - 1.0 mg/dL    Sodium 135 135 - 145 mEq/L    Potassium 3.9 3.5 - 5.3 mEq/L    Chloride 106 99 - 111 mEq/L    CO2 20 17 - 29 mEq/L    Calcium 7.5 (L) 8.5 - 10.5 mg/dL    Protein, Total 6.0 6.0 - 8.3 g/dL    Albumin 2.5 (L) 3.5 - 5.0 g/dL    AST (SGOT) 9 5 - 41 U/L    ALT 10 0 - 55 U/L    Alkaline Phosphatase 107 37 - 117 U/L    Bilirubin, Total 0.2 0.2 - 1.2 mg/dL    Globulin 3.5 2.0 - 3.6 g/dL    Albumin/Globulin Ratio 0.7 (L) 0.9 - 2.2    Anion Gap 9.0 5.0 - 15.0    eGFR 21.7 (A) >=60 mL/min/1.73 m2   CBC and differential    Collection Time: 12/11/22  5:49 PM   Result Value Ref Range    WBC 13.35 (H) 3.10 - 9.50 x10 3/uL    Hgb 8.5 (L) 11.4 - 14.8 g/dL    Hematocrit 29.3 (L) 34.7 - 43.7 %    Platelets 391 (H) 142 - 346 x10 3/uL    RBC 3.35 (L)  3.90 - 5.10 x10 6/uL    MCV 87.5 78.0 - 96.0 fL    MCH 25.4 25.1 - 33.5 pg    MCHC 29.0 (L) 31.5 - 35.8 g/dL    RDW 15 11 - 15 %    MPV 11.4 8.9 - 12.5 fL    Instrument Absolute Neutrophil Count 9.55 (H) 1.10 - 6.33 x10 3/uL    Neutrophils 71.5 None %    Lymphocytes Automated 19.1 None %    Monocytes 5.8 None %    Eosinophils Automated 2.5 None %    Basophils Automated 0.4 None %    Immature Granulocytes 0.7 None %    Nucleated RBC 0.0 0.0 - 0.0 /100 WBC    Neutrophils Absolute 9.55 (H) 1.10 - 6.33 x10 3/uL    Lymphocytes Absolute Automated 2.55 0.42 - 3.22 x10 3/uL    Monocytes Absolute Automated 0.77 0.21 - 0.85 x10 3/uL    Eosinophils Absolute Automated 0.33 0.00 - 0.44 x10 3/uL    Basophils Absolute Automated 0.05 0.00 - 0.08 x10 3/uL    Immature Granulocytes Absolute 0.10 (H) 0.00 - 0.07 x10 3/uL    Absolute NRBC 0.00 0.00 - 0.00 x10 3/uL   High Sensitivity Troponin-I    Collection Time: 12/11/22  5:49 PM   Result Value Ref Range    hs Troponin-I 14.0 (A) SEE BELOW ng/L   NT-proBNP    Collection Time: 12/11/22  5:49 PM   Result Value Ref Range    NT-proBNP 2,288 (H) 0 - 125 pg/mL   COVID-19 (SARS-CoV-2) and Influenza A/B, NAA ([facility])    Collection Time: 12/11/22  5:49 PM    Specimen: Nasopharyngeal; Culturette   Result Value Ref Range    Purpose of COVID testing Diagnostic -PUI     SARS-CoV-2 Specimen Source Nasal Swab     SARS CoV 2 Overall Result Not Detected     Influenza A Not Detected     Influenza B Not Detected        MICROBIOLOGY:  Covid/flu negative    IMAGING:  Upon my review:   CXR without acute process.     CARDIAC:  EKG Interpretation (upon my review):  NSR @ 90, no changes  from previous    Markers:  Recent Labs   Lab 12/11/22  1749   hs Troponin-I 14.0*       EMERGENCY DEPARTMENT COURSE:  Orders Placed This Encounter   Procedures    COVID-19 (SARS-CoV-2) and Influenza A/B, NAA ([facility])    XR Chest 2 Views    Comprehensive metabolic panel    CBC and differential    High Sensitivity  Troponin-I    NT-proBNP    Diet Consistent Carbohydrate and Heart Healthy    Cardiac monitoring (ED ONLY)    Oxygen NC < 5 LPM    Target Glucose Goals    Nursing communication    NSG Communication: Glucose POCT order (PRN hypoglycemia)    Notify physician (Critical Blood Glucose Value)    Notify physician (Communication: Document Abnormal Blood Glucose)    POCT order (PRN hypoglycemia)    Adult Hypoglycemia Treatment Algorithm    NSG Communication: Glucose POCT order    NSG Communication: Glucose POCT order    Vital signs    Pulse Oximetry    Progressive Mobility Protocol    Notify physician    I/O    Height    Weight    Skin assessment    Notify physician (Critical Blood Glucose Value)    Notify physician (Communication: Document Abnormal Blood Glucose)    POCT order (PRN hypoglycemia)    Adult Hypoglycemia Treatment Algorithm    Place sequential compression device    Maintain sequential compression device    Education: Activity    Education: Disease Process & Condition    Education: Pain Management    Education: Falls Risk    Education: Smoking Cessation    Nasal Cannula Low-Flow (5 LPM or less)    No antipyretics    Full Code    ECG 12 lead    Saline lock IV    Saline lock IV    Admit to Observation    Supervise For Meals Frequency: All meals       ASSESSMENT & PLAN     [name] is a 42 y.o. female admitted with shortness of breath    Patient Active Hospital Problem List:    Shortness of breath   - doubt this is due to asthma   - likely mild volume overload 2/2 hypertension/non-compliance with diuretic at home    Asthma   - no wheezing on exam   - denies fever   - CXR with no acute process   - flu, covid negative   - nebs PRN   - resume singulair, symbicort   - CM consult for prior auth      Chest heaviness  Elevated troponin   - trop 14, repeat pending   - likely demand ischemia d/t hypertension   - resume home aspirin, high intensity statin, BB   - monitor on tele     HFpEF  HTN  HLD   - echo 01/2022  w/ EF 65%, grade 1 diastolic dysfunction   - received 80mg  IV lasix in ED   - appears euvolemic on exam   - resume home bumex 2mg  BID   - resume carvedilol, statin, ARB   - monitor I&O, daily wt     CKD 4   - cr stable   - monitor I&O   - avoid nephrotoxins   - recheck BMP in a.m.       DMII  Hyperglycemia   - home levemir dose is 53 units BID   -  last admission was on levemir 20 units BID d/t borderline low glucose levels, will continue with same for now   - carb consistent diet   - SSI   - A1C 6.6     H/o DVT   - resume home apixaban 5mg  BID     Anemia of chronic disease   - hgb stable   - follow CBC         Patient has BMI=Body mass index is 51.6 kg/m.  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 Hematological)   Medication Dose Route Last Admin    apixaban (ELIQUIS) tablet 5 mg  5 mg Oral       Code Status: Full Code    Patient Class: OBSERVATION.    Anticipated medical stability for discharge: 24 - 48 Hours    Signed,  Dorann Ou, DNP NP    12/11/2022 10:42 PM  Time Elapsed: 62 minutes

## 2022-12-11 NOTE — ED Notes (Signed)
This RN came onto shift, patient had not been evaluated by provider and had HTN 190s over 110s since 1700. Notified provider and requested BP management. This RN gave the medications and will reassess

## 2022-12-11 NOTE — ED Triage Notes (Signed)
[facility] EMERGENCY DEPARTMENT  Provider in Triage Note        Patient Name: [name]    Chief Complaint:   Chief Complaint   Patient presents with    Shortness of Breath       HPI: [name] is a 42 y.o. female, who has had a rapid medical screening evaluation initiated by myself. Pt coming to ED for difficulty breathing x2-3 days. Hx of asthma and CKD. Was seen in ED and admitted 1/18 with asthma exacerbation, hyperkealemia 2/2 CKD, and HF. Symptoms improved with bumex. Taking bumex at home but endorses lower leg swelling BL.   Has used breathing treatments at home with no relief.     Medical/Surgical/Social history: as per HPI    Vitals: BP (!) 190/114   Pulse 83   Temp 97.8 F (36.6 C) (Tympanic)   Resp 20   Wt 145 kg   SpO2 100%   BMI 51.60 kg/m     Pertinent brief exam:   Examination of area of concern: ambulatory, speaking in complete sentences, normal wob, no audible wheezing    Preliminary orders: labs, EKG, cxr, covid/flu    Symptom based preliminary diagnosis/MDM: sob    Patient advised to remain in the ED until further evaluation can be performed. Patient instructed to notify staff of any changes in condition while waiting.  This assessment is an initial evaluation to expedite care.
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
## 2022-12-11 NOTE — ED to IP RN Note (Signed)
[place]  ED NURSING NOTE FOR THE RECEIVING INPATIENT NURSE   ED NURSE [place] [phone]   ED CHARGE RN Beth   ADMISSION INFORMATION   [name] is a 42 y.o. female admitted with an ED diagnosis of:    1. Severe persistent asthma with acute exacerbation    2. Hypertensive urgency    3. Demand ischemia         Isolation: None   Allergies: Amlodipine, Bactrim [sulfamethoxazole-trimethoprim], Glipizide, Lisinopril, and Losartan   Holding Orders confirmed? Yes   Belongings Documented? Yes   Home medications sent to pharmacy confirmed? N/A   NURSING CARE   Patient Comes From:   Mental Status: Home Independent  alert and oriented   ADL: Independent with all ADLs   Ambulation: no difficulty   Pertinent Information  and Safety Concerns:       HTN in 200s, being controlled with Hydralazine and Lasix, 2 nebs completed     CT / NIH   CT Head ordered on this patient?  N/A   NIH/Dysphagia assessment done prior to admission? N/A   VITAL SIGNS (at the time of this note)      Vitals:    12/11/22 2139   BP: 176/81   Pulse: (!) 101   Resp: 17   Temp: 98.2 F (36.8 C)   SpO2: 99%     Pain Score: 0-No pain (12/11/22 1712)

## 2022-12-11 NOTE — ED Provider Notes (Signed)
EMERGENCY DEPARTMENT HISTORY AND PHYSICAL EXAM      Patient Name: [name]  Age: 42 y.o. female  Encounter Date:  12/11/2022  Department:[facility] DEPT  Patient Room: GR7/GR7  PCP: Adah Perl, DO       History of Presenting Illness     Chief Complaint   Patient presents with    Shortness of Breath       History Provided By: Patient    History obtained from a source other than the patient: No.     [name] is a 42 y.o. 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  No relief with use of home rescue inhaler.  Patient also endorses bilateral leg edema.  Patient reports she has had difficulty filling her prescriptions due to insurance issue, so has been off her medications for several days.  No dizziness, syncope, diaphoresis, abdominal pain, CP, vomiting, diarrhea, fevers, chills, congestion, cough, urinary symptoms, paresthesias.    I reviewed patient's last ED visit, clinic visit or admission/discharge summary, as well as associated recent EKGs, lab or imaging results, if applicable.     Review of Systems     Please refer to HPI for pertinent positives and negatives.     Physical Exam   BP 190/76   Pulse 89   Temp 98.1 F (36.7 C) (Oral)   Resp 22   Wt 145 kg   SpO2 100%   BMI 51.60 kg/m     Physical Exam  Vitals and nursing note reviewed.   Constitutional:       Appearance: Normal appearance.      Comments: In distress due to symptoms.   HENT:      Head: Normocephalic and atraumatic.      Nose: Nose normal.      Mouth/Throat:      Mouth: Mucous membranes are moist.   Eyes:      Conjunctiva/sclera: Conjunctivae normal.      Pupils: Pupils are equal, round, and reactive to light.   Neck:      Vascular: No JVD.    Cardiovascular:      Rate and Rhythm: Normal rate and regular rhythm.      Pulses: Normal pulses.      Heart sounds: Normal heart sounds.      Comments: Regular pulses, palpable in all 4 extremities.  Bilateral 2+ pedal edema.  Pulmonary:      Comments: Tachypnea with increased work of breathing and diffuse expiratory wheezing.  Unable to speak in complete sentences.  Abdominal:      General: Bowel sounds are normal. There is no distension.      Palpations: Abdomen is soft.      Tenderness: There is no abdominal tenderness.      Comments: Abdomen soft, NTND, NABS. No guarding, rebound or rigidity. No palpable masses or hernias.   Musculoskeletal:         General: No swelling or tenderness. Normal range of motion.      Cervical back: Normal range of motion and neck supple.   Skin:     General: Skin is warm and dry.      Findings: No rash.   Neurological:      General: No focal deficit present.  Mental Status: She is oriented to person, place, and time.      Motor: No abnormal muscle tone.      Comments: Awake and alert.  GCS 15.  Grossly nonfocal.   Psychiatric:         Mood and Affect: Mood normal.         Behavior: Behavior normal.           Medical Decision Making   I am the first provider for this patient.    I reviewed the vital signs, available nursing notes, allergies, past medical history, past surgical history, family history and social history. If pertinent, they are mentioned in HPI.     Personal Protective Equipment (PPE)  Gloves, surgical hat and surgical mask.    Provider Notes/Summary:     42 y.o. female with multiple chronic conditions, presents with worsening dyspnea and leg edema for the past 2 days.  Noncompliant with home medications due to insurance issues.  On arrival patient afebrile with good sats on RA, tachypneic, BP 217/97.  Exam as above.  Critical Care involved close monitoring, frequent reassessments, multiple medication agents, as outlined below.  EKG without ischemic features.   Labwork with hyperglycemia without acidosis, elevated creatinine at baseline, reactive leukocytosis without bandemia, anemia of chronic disease, mildly elevated troponin, elevated BNP at 2288, otherwise reassuring electrolytes, LFTs, negative COVID/flu swabs.  CXR without infiltrates or pulmonary edema, however clinically patient appears hypervolemic.  Considered CTA chest but deferred due to low suspicion for PE at this time and her symptoms being attributable to asthma exacerbation with associated hypervolemia.  In the ED she received continuous albuterol/ipratropium treatments x 2, IV hydralazine x 2, IV Lasix, oral potassium, oral aspirin with some improvement of symptoms.  Considered SL nitro but deferred due to improvement of BP and dyspnea with alternative agents.  His BP improved to 170s.  Case discussed with [facility], who accepted patient for admission.  Patient received the plan and is stable for transport upstairs.    Pulse Oximetry Analysis:  Interpreted by me.  100% on RA- Normal  Cardiac Monitor: Interpreted by me. Rhythm:  Normal Sinus, Rate:  Normal, Ectopy:  None    EKG: Interpreted by me, the Emergency Physician.   Time Interpreted: 1723  Rate: 90  Rhythm: Normal Sinus Rhythm   Interpretation: QTc 469, no ST elevations or TWIs  Comparison: No change compared to prior EKG dated 11/27/2022.    Labs: All labs have been ordered, reviewed and interpreted by me. See Provider Notes/Summary section for discussion.   Xrays: Ordered, reviewed and interpreted by me, confirmed by radiology report. See Provider Notes/Summary section for discussion.   __________________________________________________________________    Critical Care Time:     CRITICAL CARE: The high probability of sudden, clinically significant deterioration in the patient's condition required the highest level of my preparedness to intervene urgently.    The services I provided to this patient were to treat and/or prevent clinically significant  deterioration that could result in: death.  Services included the following: chart data review, reviewing nursing notes and/or old charts, documentation time, consultant collaboration regarding findings and treatment options, medication orders and management, direct patient care, re-evaluations, vital sign assessments and ordering, interpreting and reviewing diagnostic studies/lab tests.    Aggregate critical care time was 139 minutes, which includes only time during which I was engaged in work directly related to the patient's care, as described above, whether at the bedside or elsewhere in the Emergency Department.  It did not include time spent performing other reported procedures or the services of residents, students, nurses or physician assistants.    For Hospitalized Patients:    Hospitalization Decision Time:    I have discussed this case with Dr. Jennefer Bravo at 2116 on 12/11/22, who accepts patient for admission and requests Observation IMCU bed.       Core Measures:     - 12-lead EKG was performed in the ED. Aspirin: Aspirin was given.    SEP-1 Charting   A. ------------Severe Sepsis/Septic Shock Exclusion----------------------------    Pt is excluded from severe sepsis or septic shock consideration at 2118 [enter time of bed request placement] on 12/11/2022 (date) because of one or more reasons below:    All SIRS, abnl vitals, organ dysfunction are NOT due to severe sepsis or septic shock, but due to alternative cause: Other: Asthma exacerbation [list cause].         Diagnosis     Clinical Impression:   1. Severe persistent asthma with acute exacerbation    2. Hypertensive urgency    3. Demand ischemia    4. Hyperglycemia    5. Elevated serum creatinine    6. Anemia of chronic disease    7. Bilateral leg edema         Disposition:   ED Disposition       ED Disposition   Observation    Condition   --    Date/Time   Thu Dec 11, 2022  9:18 PM    Comment   Admitting Physician: Alla Feeling [[id number]]   Service::  Medicine [[id number]]   Estimated Length of Stay: < 2 midnights   Tentative Discharge Plan?: Home or Self Care [1]   Does patient need telemetry?: Yes   Is patient 18 yrs or greater?: Yes   Telemetry type (separate Telemetry order is also required):: Adult telemetry                 The above diagnostic process was due to medical necessity based on risk stratification of potential harm of patient's presenting complaint.     CHART OWNERSHIP: This note is prepared by Enis Gash, MD, PHD, FACEP. I am the first provider for this patient.    This note was generated by the Epic EMR system/ Dragon speech recognition and may contain inherent errors or omissions not intended by the user. Grammatical errors, random word insertions, deletions and pronoun errors  are occasional consequences of this technology due to software limitations. Not all errors are caught or corrected. If there are questions or concerns about the content of this note or information contained within the body of this dictation they should be addressed directly with the author for clarification.    Electronically signed by Enis Gash, MD, PHD, Thedore Mins, MD PhD  12/12/22 [phone]

## 2022-12-12 LAB — HIGH SENSITIVITY TROPONIN-I
hs Troponin-I: 18.1 ng/L — AB
hs Troponin-I: 26.9 ng/L — AB
hs Troponin-I: 21.4 ng/L — AB

## 2022-12-12 LAB — CBC AND DIFFERENTIAL
Absolute NRBC: 0 10*3/uL (ref 0.00–0.00)
Basophils Absolute Automated: 0.07 10*3/uL (ref 0.00–0.08)
Basophils Automated: 0.5 %
Eosinophils Absolute Automated: 0.3 10*3/uL (ref 0.00–0.44)
Eosinophils Automated: 2 %
Hematocrit: 26.3 % — ABNORMAL LOW (ref 34.7–43.7)
Hgb: 7.7 g/dL — ABNORMAL LOW (ref 11.4–14.8)
Immature Granulocytes Absolute: 0.12 10*3/uL — ABNORMAL HIGH (ref 0.00–0.07)
Immature Granulocytes: 0.8 %
Instrument Absolute Neutrophil Count: 10.39 10*3/uL — ABNORMAL HIGH (ref 1.10–6.33)
Lymphocytes Absolute Automated: 2.88 10*3/uL (ref 0.42–3.22)
Lymphocytes Automated: 19.4 %
MCH: 25.8 pg (ref 25.1–33.5)
MCHC: 29.3 g/dL — ABNORMAL LOW (ref 31.5–35.8)
MCV: 88.3 fL (ref 78.0–96.0)
MPV: 10.9 fL (ref 8.9–12.5)
Monocytes Absolute Automated: 1.06 10*3/uL — ABNORMAL HIGH (ref 0.21–0.85)
Monocytes: 7.2 %
Neutrophils Absolute: 10.39 10*3/uL — ABNORMAL HIGH (ref 1.10–6.33)
Neutrophils: 70.1 %
Nucleated RBC: 0 /100 WBC (ref 0.0–0.0)
Platelets: 345 10*3/uL (ref 142–346)
RBC: 2.98 10*6/uL — ABNORMAL LOW (ref 3.90–5.10)
RDW: 15 % (ref 11–15)
WBC: 14.82 10*3/uL — ABNORMAL HIGH (ref 3.10–9.50)

## 2022-12-12 LAB — ECG 12-LEAD
Atrial Rate: 90 {beats}/min
IHS MUSE NARRATIVE AND IMPRESSION: NORMAL
P Axis: 67 degrees
P-R Interval: 152 ms
Q-T Interval: 384 ms
QRS Duration: 82 ms
QTC Calculation (Bezet): 469 ms
R Axis: 6 degrees
T Axis: 108 degrees
Ventricular Rate: 90 {beats}/min

## 2022-12-12 LAB — BASIC METABOLIC PANEL
Anion Gap: 8 (ref 5.0–15.0)
BUN: 46 mg/dL — ABNORMAL HIGH (ref 7.0–21.0)
CO2: 18 mEq/L (ref 17–29)
Calcium: 7.3 mg/dL — ABNORMAL LOW (ref 8.5–10.5)
Chloride: 110 mEq/L (ref 99–111)
Creatinine: 2.7 mg/dL — ABNORMAL HIGH (ref 0.4–1.0)
Glucose: 294 mg/dL — ABNORMAL HIGH (ref 70–100)
Potassium: 4.3 mEq/L (ref 3.5–5.3)
Sodium: 136 mEq/L (ref 135–145)
eGFR: 21.7 mL/min/{1.73_m2} — AB (ref >=60)

## 2022-12-12 LAB — WHOLE BLOOD GLUCOSE POCT
Whole Blood Glucose POCT: 191 mg/dL — ABNORMAL HIGH (ref 70–100)
Whole Blood Glucose POCT: 241 mg/dL — ABNORMAL HIGH (ref 70–100)
Whole Blood Glucose POCT: 237 mg/dL — ABNORMAL HIGH (ref 70–100)
Whole Blood Glucose POCT: 243 mg/dL — ABNORMAL HIGH (ref 70–100)

## 2022-12-12 MED ORDER — INSULIN GLARGINE 100 UNIT/ML SC SOLN
20.0000 [IU] | Freq: Two times a day (BID) | SUBCUTANEOUS | Status: DC
Start: 2022-12-12 — End: 2022-12-13
  Administered 2022-12-12 – 2022-12-13 (×3): 20 [IU] via SUBCUTANEOUS
  Filled 2022-12-12 (×4): qty 20

## 2022-12-12 MED ORDER — HYDRALAZINE HCL 20 MG/ML IJ SOLN
5.0000 mg | Freq: Four times a day (QID) | INTRAMUSCULAR | Status: DC | PRN
Start: 2022-12-12 — End: 2022-12-13
  Administered 2022-12-12: 5 mg via INTRAVENOUS
  Filled 2022-12-12: qty 1

## 2022-12-12 NOTE — Plan of Care (Signed)
Problem: Pain interferes with ability to perform ADL  Goal: Pain at adequate level as identified by patient  12/12/2022 2019 by Aquilla Solian, RN  Outcome: Progressing  Flowsheets (Taken 12/12/2022 2019)  Pain at adequate level as identified by patient:   Identify patient comfort function goal   Assess for risk of opioid induced respiratory depression, including snoring/sleep apnea. Alert healthcare team of risk factors identified.   Assess pain on admission, during daily assessment and/or before any "as needed" intervention(s)   Reassess pain within 30-60 minutes of any procedure/intervention, per Pain Assessment, Intervention, Reassessment (AIR) Cycle   Evaluate if patient comfort function goal is met   Evaluate patient's satisfaction with pain management progress   Offer non-pharmacological pain management interventions  12/12/2022 0627 by Aquilla Solian, RN  Outcome: Progressing  Flowsheets (Taken 12/12/2022 [phone])  Pain at adequate level as identified by patient:   Identify patient comfort function goal   Assess for risk of opioid induced respiratory depression, including snoring/sleep apnea. Alert healthcare team of risk factors identified.   Assess pain on admission, during daily assessment and/or before any "as needed" intervention(s)   Reassess pain within 30-60 minutes of any procedure/intervention, per Pain Assessment, Intervention, Reassessment (AIR) Cycle   Evaluate patient's satisfaction with pain management progress   Evaluate if patient comfort function goal is met   Offer non-pharmacological pain management interventions   Consult/collaborate with Pain Service   Consult/collaborate with Physical Therapy, Occupational Therapy, and/or Speech Therapy  12/12/2022 0626 by Aquilla Solian, RN  Outcome: Progressing     Problem: Side Effects from Pain Analgesia  Goal: Patient will experience minimal side effects of analgesic therapy  12/12/2022 2019 by Aquilla Solian, RN  Outcome:  Progressing  Flowsheets (Taken 12/12/2022 2019)  Patient will experience minimal side effects of analgesic therapy:   Monitor/assess patient's respiratory status (RR depth, effort, breath sounds)   Assess for changes in cognitive function   Evaluate for opioid-induced sedation with appropriate assessment tool (i.e. POSS)  12/12/2022 0627 by Aquilla Solian, RN  Outcome: Progressing  Flowsheets (Taken 12/12/2022 [phone])  Patient will experience minimal side effects of analgesic therapy:   Monitor/assess patient's respiratory status (RR depth, effort, breath sounds)   Prevent/manage side effects per LIP orders (i.e. nausea, vomiting, pruritus, constipation, urinary retention, etc.)   Assess for changes in cognitive function   Evaluate for opioid-induced sedation with appropriate assessment tool (i.e. POSS)     Problem: Inadequate Gas Exchange  Goal: Adequate oxygenation and improved ventilation  12/12/2022 2019 by Aquilla Solian, RN  Outcome: Progressing  Flowsheets (Taken 12/12/2022 2019)  Adequate oxygenation and improved ventilation:   Assess lung sounds   Monitor SpO2 and treat as needed   Provide mechanical and oxygen support to facilitate gas exchange   Position for maximum ventilatory efficiency   Teach/reinforce use of incentive spirometer 10 times per hour while awake, cough and deep breath as needed   Plan activities to conserve energy: plan rest periods   Increase activity as tolerated/progressive mobility  12/12/2022 0627 by Aquilla Solian, RN  Outcome: Progressing  Flowsheets (Taken 12/12/2022 [phone])  Adequate oxygenation and improved ventilation:   Provide mechanical and oxygen support to facilitate gas exchange   Assess lung sounds   Monitor and treat ETCO2   Monitor SpO2 and treat as needed   Position for maximum ventilatory efficiency   Plan activities to conserve energy: plan rest periods   Teach/reinforce use of incentive spirometer 10 times per hour while awake, cough and deep  breath as needed  Goal:  Patent Airway maintained  Outcome: Progressing  Flowsheets (Taken 12/12/2022 0627)  Patent airway maintained:   Position patient for maximum ventilatory efficiency   Suction secretions as needed   Reinforce use of ordered respiratory interventions (i.e. CPAP, BiPAP, Incentive Spirometer, Acapella, etc.)   Reposition patient every 2 hours and as needed unless able to self-reposition     Problem: Renal Instability  Goal: Fluid and electrolyte balance are achieved/maintained  12/12/2022 2019 by Aquilla Solian, RN  Outcome: Progressing  Flowsheets (Taken 12/12/2022 2019)  Fluid and electrolyte balance are achieved/maintained:   Monitor/assess lab values and report abnormal values   Assess and reassess fluid and electrolyte status   Observe for cardiac arrhythmias   Monitor for muscle weakness  12/12/2022 0627 by Aquilla Solian, RN  Outcome: Progressing  Flowsheets (Taken 12/12/2022 [phone])  Fluid and electrolyte balance are achieved/maintained:   Monitor/assess lab values and report abnormal values   Observe for cardiac arrhythmias   Assess and reassess fluid and electrolyte status   Monitor for muscle weakness   Follow fluid restrictions/IV/PO parameters     Problem: Compromised Moisture  Goal: Moisture level Interventions  Outcome: Progressing  Flowsheets (Taken 12/12/2022 2019)  Moisture level Interventions: Moisture wicking products, Moisture barrier cream     Problem: Compromised Activity/Mobility  Goal: Activity/Mobility Interventions  Outcome: Progressing  Flowsheets (Taken 12/12/2022 2019)  Activity/Mobility Interventions: Pad bony prominences, TAP Seated positioning system when OOB, Promote PMP, Reposition q 2 hrs / turn clock, Offload heels     Problem: Compromised Friction/Shear  Goal: Friction and Shear Interventions  Outcome: Progressing  Flowsheets (Taken 12/12/2022 2019)  Friction and Shear Interventions: Pad bony prominences, Off load heels, HOB 30 degrees or less unless contraindicated, Consider: TAP seated  positioning, Heel foams

## 2022-12-12 NOTE — Plan of Care (Addendum)
Pt admitted from ER around 2030, Pt alert and oriented x 4, NSR, sating 99% on room air, VSS. Pt oriented to room and call button, personal belongings are within reach, All safety measures in place,  will continue to monitor BP and respiratory effort.  4 eyes in 4 hours pressure injury assessment note:      Completed with: Noella  Unit & Time admitted: 28 2043             Bony Prominences: Check appropriate box; if wound is present enter wound assessment in LDA     Occiput:                 [x] WNL  []  Wound present  Face:                     [x] WNL  []  Wound present  Ears:                      [x] WNL  []  Wound present  Spine:                    [x] WNL  []  Wound present  Shoulders:             [x] WNL  []  Wound present  Elbows:                  [x] WNL  []  Wound present  Sacrum/coccyx:     [x] WNL  []  Wound present  Ischial Tuberosity:  [x] WNL  []  Wound present  Trochanter/Hip:      [x] WNL  []  Wound present  Knees:                   [x] WNL  []  Wound present  Ankles:                   [x] WNL  []  Wound present  Heels:                    [x] WNL  []  Wound present  Other pressure areas:  []  Wound location       Device related: []  Device name:         LDA completed if wound present: yes/no  Consult WOCN if necessary    Other skin related issues, ie tears, rash, etc, document in Integumentary flowsheet       Problem: Pain interferes with ability to perform ADL  Goal: Pain at adequate level as identified by patient  12/12/2022 0627 by Aquilla Solian, RN  Outcome: Progressing  Flowsheets (Taken 12/12/2022 1610)  Pain at adequate level as identified by patient:   Identify patient comfort function goal   Assess for risk of opioid induced respiratory depression, including snoring/sleep apnea. Alert healthcare team of risk factors identified.   Assess pain on admission, during daily assessment and/or before any "as needed" intervention(s)   Reassess pain within 30-60 minutes of any procedure/intervention, per Pain Assessment,  Intervention, Reassessment (AIR) Cycle   Evaluate patient's satisfaction with pain management progress   Evaluate if patient comfort function goal is met   Offer non-pharmacological pain management interventions   Consult/collaborate with Pain Service   Consult/collaborate with Physical Therapy, Occupational Therapy, and/or Speech Therapy  12/12/2022 0626 by Aquilla Solian, RN  Outcome: Progressing     Problem: Side Effects from Pain Analgesia  Goal: Patient will experience minimal side effects of analgesic therapy  Outcome: Progressing  Flowsheets (Taken 12/12/2022 9604)  Patient will  experience minimal side effects of analgesic therapy:   Monitor/assess patient's respiratory status (RR depth, effort, breath sounds)   Prevent/manage side effects per LIP orders (i.e. nausea, vomiting, pruritus, constipation, urinary retention, etc.)   Assess for changes in cognitive function   Evaluate for opioid-induced sedation with appropriate assessment tool (i.e. POSS)     Problem: Inadequate Gas Exchange  Goal: Adequate oxygenation and improved ventilation  Outcome: Progressing  Flowsheets (Taken 12/12/2022 2778)  Adequate oxygenation and improved ventilation:   Provide mechanical and oxygen support to facilitate gas exchange   Assess lung sounds   Monitor and treat ETCO2   Monitor SpO2 and treat as needed   Position for maximum ventilatory efficiency   Plan activities to conserve energy: plan rest periods   Teach/reinforce use of incentive spirometer 10 times per hour while awake, cough and deep breath as needed  Goal: Patent Airway maintained  Outcome: Progressing  Flowsheets (Taken 12/12/2022 0627)  Patent airway maintained:   Position patient for maximum ventilatory efficiency   Suction secretions as needed   Reinforce use of ordered respiratory interventions (i.e. CPAP, BiPAP, Incentive Spirometer, Acapella, etc.)   Reposition patient every 2 hours and as needed unless able to self-reposition     Problem: Renal  Instability  Goal: Fluid and electrolyte balance are achieved/maintained  Outcome: Progressing  Flowsheets (Taken 12/12/2022 0627)  Fluid and electrolyte balance are achieved/maintained:   Monitor/assess lab values and report abnormal values   Observe for cardiac arrhythmias   Assess and reassess fluid and electrolyte status   Monitor for muscle weakness   Follow fluid restrictions/IV/PO parameters

## 2022-12-12 NOTE — Discharge Instr - AVS First Page (Addendum)
Discharge Instructions:    You were admitted for evaluation of shortness of breath and chest pain. You were seen by Cardiologist who has adjusted your medications slightly (Please see medication list provided in discharge paperwork).   It is important that you follow up with your outpatient Cardiologist in the next 1-2 weeks for further evaluation. I recommend consideration of an echocardiogram for evaluation.   Also it is important to follow up with your Primary Care Provider as well as your Nephrologist outpatient ideally within the next 1-2 weeks for further medical evaluation and management.     I recommend that you take your blood pressure morning and evening, making a log that you can take to your doctors appointments outpatient so that they may further adjust your medications.     Consultants: Cardiology    Discharge Diet: diabetic & heart healthy, low fat, low salt    Medications: Please take your prescription medications as directed. Make sure to obtain any refills from your outpatient Primary care provider or Specialist.       Follow up Appointments:   Follow-up Information       Adah Perl, DO. Schedule an appointment as soon as possible for a visit in 1 week(s).    Specialty: Family Medicine  Contact information:  [place]  [place] [address]  [phone]               Coralee Rud, MD. Schedule an appointment as soon as possible for a visit in 1 week(s).    Specialty: Cardiology  Contact information:  [address]  [address] [address]  [phone]               Verl Blalock, MD. Schedule an appointment as soon as possible for a visit in 1 week(s).    Specialties: Internal Medicine, Nephrology  Contact information:  [address]  [place] [address]  [phone]                               If you have any fever, chest pain, palpitations, shortness of breath or difficulty breathing, worsening nausea and vomiting, or lower leg pain  please seek medical attention immediately.     If you are unable to obtain an appointment, unable to obtain newly prescribed medications, or are unclear about any of your discharge instructions please contact your discharging physician at [phone] (M-F, 8am-3pm) or weekends and after hours via the hospital operator [phone], hospital case manager, or your primary care physician.

## 2022-12-12 NOTE — UM Notes (Signed)
UTILIZATION REVIEW CONTACT: Name:  Milas Gain MSN RN CCM   Utilization Review Case Manager    [facility]  Address:  [address] , [place] ,[place] [address]  NPI:   [id number]  Tax ID:  [id number]  Phone: [phone]  Fax: [phone]  Email: [email or web address]        ER ADMIT DATE AND TIME: 12/11/2022  5:27 PM  OBS admit:  12/11/22 2118  Level of Care: Intermediate Care   Patient Class: Observation   REVIEW FOR 2/1-2/2    PATIENT NAME: [name], [name]  DOB: [birth year]      ADMISSION REVIEW   History of present illness: 42 year old female with asthma and uncontrolled hypertension who was recently admitted 11/27/2022 - 12/02/2022 for asthma exacerbation, who is back for acute decompensated heart failure versus recurrent asthma exacerbation.  Patient was unable to obtain any of her medications due to requiring prior Auth from insurance, has Medicaid.  She has been out of her hydralazine, and unable to get her inhalers, so was feeling progressively short of breath and having a cough, so came back to ED. BNP elevated, no wheezing on exam, suspect volume overload over asthma exacerbation.  Plan is for treatment for volume overload with restart of home diuretics, stabilization of blood pressure with antihypertensives, getting her back on home inhalers, case management consult for assistance with insurance and affording medications to make sure that what is ordered is on formulary, and sending patient home when she is ready for discharge with appropriate medication that she can afford.        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        DIAGNOSIS:     ICD-10-CM    1. Severe persistent asthma with acute exacerbation  J45.51       2. Hypertensive urgency  I16.0       3. Demand ischemia  I24.89       4. Hyperglycemia  R73.9       5. Elevated serum creatinine  R79.89       6. Anemia of chronic disease  D63.8       7. Bilateral leg edema  R60.0             VS:   Vitals:    12/12/22 1205   BP: 149/66   Pulse: 79   Resp: 18   Temp: 98.8 F (37.1 C)   SpO2: 96%          LABS     Radiologic Studies -   XR Chest 2 Views    Result Date: 12/11/2022  1. No acute cardiopulmonary processes. Laurence Slate, MD 12/11/2022 5:48 PM        ED meds:    Date/Time Order Dose Route Action    12/11/2022 1745 EST albuterol-ipratropium (DUO-NEB) 2.5-0.5(3) mg/3 mL nebulizer 3 mL 3 mL Nebulization Given    12/11/2022 1918 EST hydrALAZINE (APRESOLINE) injection 10 mg 10 mg Intravenous Given    12/11/2022 2021 EST albuterol (PROVENTIL) (2.5 MG/3ML) 0.083% nebulizer solution 10 mg 10 mg Nebulization Given  12/11/2022 2023 EST potassium chloride (KLOR-CON) packet 60 mEq 60 mEq Oral Given    12/11/2022 2111 EST furosemide (LASIX) injection 80 mg 80 mg Intravenous Given    12/11/2022 2112 EST hydrALAZINE (APRESOLINE) injection 20 mg 20 mg Intravenous Given    12/11/2022 2111 EST albuterol (PROVENTIL) (2.5 MG/3ML) 0.083% nebulizer solution 5 mg 5 mg Nebulization Given    12/11/2022 2111 EST aspirin chewable tablet 324 mg 324 mg Oral Given    12/11/2022 2212 EST ondansetron (ZOFRAN) injection 4 mg 4 mg Intravenous Given    12/11/2022 2212 EST acetaminophen (TYLENOL) tablet 650 mg 650 mg Oral Given       MD NOTES:  H&P  [name] is a 42 y.o. female admitted with shortness of breath     Patient Active Hospital Problem List:     Shortness of breath   - doubt this is due to asthma   - likely mild volume overload 2/2 hypertension/non-compliance with diuretic at  home     Asthma   - no wheezing on exam   - denies fever   - CXR with no acute process   - flu, covid negative   - nebs PRN   - resume singulair, symbicort   - CM consult for prior auth      Chest heaviness  Elevated troponin   - trop 14, repeat pending   - likely demand ischemia d/t hypertension   - resume home aspirin, high intensity statin, BB   - monitor on tele     HFpEF  HTN  HLD   - echo 01/2022 w/ EF 27%, grade 1 diastolic dysfunction   - received 80mg  IV lasix in ED   - appears euvolemic on exam   - resume home bumex 2mg  BID   - resume carvedilol, statin, ARB   - monitor I&O, daily wt     CKD 4   - cr stable   - monitor I&O   - avoid nephrotoxins   - recheck BMP in a.m.       DMII  Hyperglycemia   - home levemir dose is 53 units BID   - last admission was on levemir 20 units BID d/t borderline low glucose levels, will continue with same for now   - carb consistent diet   - SSI   - A1C 6.6     H/o DVT   - resume home apixaban 5mg  BID     Anemia of chronic disease   - hgb stable   - follow CBC         I/S: PULSE OXIMETRY; SCDs; DUO NEB; DIABETIC DIET    Current Medications:    Scheduled Meds:  Current Facility-Administered Medications   Medication Dose Route Frequency    apixaban  5 mg Oral Q12H    ARIPiprazole  10 mg Oral Daily    aspirin  81 mg Oral Daily    atorvastatin  80 mg Oral QHS    budesonide-formoterol  2 puff Inhalation BID    bumetanide  2 mg Oral BID    carvedilol  25 mg Oral Q12H [place]    docusate sodium  100 mg Oral QHS    DULoxetine  20 mg Oral Daily    folic acid  1 mg Oral Daily    insulin glargine  20 Units Subcutaneous Q12H    insulin lispro  1-3 Units Subcutaneous QHS    insulin lispro  1-5 Units Subcutaneous TID AC    montelukast  10 mg Oral QHS    pregabalin  150 mg Oral BID    sertraline  50 mg Oral Daily    sodium bicarbonate  650 mg Oral BID    traZODone  50 mg Oral QHS

## 2022-12-12 NOTE — Plan of Care (Signed)
Remains a/0 x3,vswnl,afebrile,slight sob on exertion,reported breathing better with treatment,RA,sat 98-100,SR,MAE,ambulatory with SBA,gait steady,AC with SS,[place] plan to home onhold,second Trop is 26.9.Md aware,will consult Cardio, Tele,Echo.pt is in bed resting comfortably.  Problem: Pain interferes with ability to perform ADL  Goal: Pain at adequate level as identified by patient  Outcome: Progressing  Flowsheets (Taken 12/12/2022 1528)  Pain at adequate level as identified by patient:   Identify patient comfort function goal   Assess pain on admission, during daily assessment and/or before any "as needed" intervention(s)   Reassess pain within 30-60 minutes of any procedure/intervention, per Pain Assessment, Intervention, Reassessment (AIR) Cycle   Consult/collaborate with Physical Therapy, Occupational Therapy, and/or Speech Therapy   Evaluate patient's satisfaction with pain management progress   Offer non-pharmacological pain management interventions   Evaluate if patient comfort function goal is met   Include patient/patient care companion in decisions related to pain management as needed   Assess for risk of opioid induced respiratory depression, including snoring/sleep apnea. Alert healthcare team of risk factors identified.   Consult/collaborate with Pain Service     Problem: Inadequate Gas Exchange  Goal: Adequate oxygenation and improved ventilation  Outcome: Progressing  Flowsheets (Taken 12/12/2022 1528)  Adequate oxygenation and improved ventilation:   Assess lung sounds   Consult/collaborate with Respiratory Therapy   Teach/reinforce use of incentive spirometer 10 times per hour while awake, cough and deep breath as needed   Monitor SpO2 and treat as needed   Monitor and treat ETCO2   Plan activities to conserve energy: plan rest periods   Provide mechanical and oxygen support to facilitate gas exchange   Position for maximum ventilatory efficiency   Increase activity as tolerated/progressive mobility      Problem: Inadequate Gas Exchange  Goal: Patent Airway maintained  Outcome: Progressing  Flowsheets (Taken 12/12/2022 1528)  Patent airway maintained:   Reinforce use of ordered respiratory interventions (i.e. CPAP, BiPAP, Incentive Spirometer, Acapella, etc.)   Position patient for maximum ventilatory efficiency   Provide adequate fluid intake to liquefy secretions   Suction secretions as needed   Reposition patient every 2 hours and as needed unless able to self-reposition     Problem: Renal Instability  Goal: Fluid and electrolyte balance are achieved/maintained  Outcome: Progressing  Flowsheets (Taken 12/12/2022 1528)  Fluid and electrolyte balance are achieved/maintained:   Monitor/assess lab values and report abnormal values   Assess and reassess fluid and electrolyte status   Observe for cardiac arrhythmias   Monitor for muscle weakness   Follow fluid restrictions/IV/PO parameters     Problem: Renal Instability  Goal: Perineal skin integrity is maintained or improved and remains free from infection  Outcome: Progressing  Flowsheets (Taken 12/12/2022 1528)  Perineal skin integrity is maintained or improved:   Apply urinary containment device as appropriate and/or per order   Encourage bladder emptying at regular intervals   Utilize bladder scans prior to or post void as appropriate   Assess need for indwelling catheter every shift and discuss with LIP   Encourage patient to identify medications that aid bladder function prior to administration     Problem: Renal Instability  Goal: Perineal skin integrity is maintained or improved and remains free from infection  Outcome: Progressing  Flowsheets (Taken 12/12/2022 1528)  Perineal skin integrity is maintained or improved:   Apply urinary containment device as appropriate and/or per order   Encourage bladder emptying at regular intervals   Utilize bladder scans prior to or post void as appropriate  Assess need for indwelling catheter every shift and discuss with LIP    Encourage patient to identify medications that aid bladder function prior to administration

## 2022-12-12 NOTE — Progress Notes (Signed)
Assessment Smart phrase for obs and low risk patient     Introduced [employer] and self to patient and/or family Chemical engineer (name and contact tel #)  Patient (See facesheet)  Discussed plan of care and possible discharge needs with patient/family/care-giver (Y, N) - if family/care-giver, provide name:  Yes  Patient has cognitive ability to participate in care decisions and follow-up care as needed (Y, N) - provide comments if no:  Yes  Verified (and corrected if needed) demographics and PCP on facesheet (Y, N)  Yes  Assessed for lack of insurance or underinsured. Darreld Mclean, N) On file verified.  If no insurance or underinsured, patient is high risk and needs high risk assessment     Patient is NOT identified as a Medicare focused patient or high risk for failed discharge plan or readmission as detailed in the High Risk Patient Screening policy (Y, N)  Not high risk or Medicare focused.  Plan of care: Pt reports being independent prior-no DME use. Pt voiced concerns about food and transportation. Agreeable to [employer] [place] referral and CM recommended patient contact Medicaid regarding transportation to appointments. Denies any safety concerns. Daughter to transport on discharge.    Discharge Plan A: Home with family.  Discharge Plan B:    Expected discharge date: 2/3    Barriers to discharge: none              a) Barriers needing escalation:              b) Escalated to:       Tentative d/c date and CM # on white board: remote assessment    Comments: CM to follow hospital course for additional needs.

## 2022-12-12 NOTE — Progress Notes (Signed)
[facility]  PROGRESS NOTE      Patient: [name]  Date: 12/12/2022   LOS: 0 Days  Admission Date: 12/11/2022   MRN: [id number]  Attending: Audree Camel, DO  When on service as the attending, please contact me on Epic Secure Chat from 7 AM - 7 PM       ASSESSMENT/PLAN     Interval Summary:     [name] is a 42 y.o. female with a PMHx of  DVT on apixaban, CKD 4, DMII on insulin, HFpEF, HTN, HLD, recently admitted to [facility] 1/18 - 1/25 with asthma exacerbation/chest pain/uncontrolled HTN, returns to the ED due to worsening shortness of breath worse with exertion and orthopnea as well as chest discomfort.       Patient Active Hospital Problem List:    #Dyspnea on exertion, Orthopnea, and Chest pain concerning for acute on chronic diastolic CHF  - last echo in EMR 01/2022 with LVEF 65%, G1DD, no significant valvular dysfunction.  - CXR reporting neg for acute process  - elevated probnp  - Repeat TTE ordered   - monitor on telemetry x24 hours  - Given IV lasix 80mg  in ED on arrival, then restarted on home dose PO bumex.   - continue appropriate home meds- bumex 2 BID, coreg 25 BID, aspirin 81, lipitor 80  - IMG cardiology consulted, appreciate recs    #Hypertensive Emergency on arrival  - likely secondary to running out of home BP meds  - resumed appropriate home meds with improvement  - monitor vitals    #Elevated troponin, trending up  - suspect demand ischemia due to above uncontrolled hypertension and CHF  - Consulting IMG cardiology given trop is trending up, appreciate recs  - trend trop, monitor tele, repeat EKG    #Reactive leukocytosis?  - unclear evidence of infection. She does have some dyspnea but no wheeze, CXR neg for acute process, covid/flu screen neg.   - trend WBC. Monitor off antibiotics for the time being.     #Chronic asthma  #OSA/OHS  - patient reports having been formally tested and diagnose w/OSA but does not have home cpap due to insurance issues  - prn  nebs    #Dyslipidemia  - continue appropriate home med statin    #DM type 2 w/hyperglycemia  - continue home scheduled levemir  - ISS w/accuchecks  - diabetic diet    #CKD4, stable at baseline  - monitor, avoid nephrotoxic agents when possible    #Anemia of chronic disease  - H&H stable, monitor    #Hx DVT  - continue home eliquis    #Morbid obesity, BMI 51.6  - counseled lifestyle modifications for weight loss  - patient reports having an appointment with bariatric surgery in near future for further evaluation.    #Medication non-adherence  - ran out of prescription medication and unable to get refills outpatient.   - counseled on importance of refilling meds and taking as directed    Nutrition: carb consistent heart healthy, fluid restrict        DVT Prophylaxis:eliquis       Patient has BMI=Body mass index is 51.6 kg/m.  Diagnosis: Obesity Class 3 (formerly known as Morbid Obesity) based on BMI criteria       Code Status: full    Dispo: Likely home without services in 24 hours pending cardiac clearance and clinical improvement. Will need refills on home prescription medications. Reports having PCP appointment outpatient on 2/8.  Family Contact: daughter on phone while I was at patients bedside       [place] seen and examined by myself. Vs stable, awake alert no distress. Still with some chest discomfort and dyspnea worse with deep breaths and laying flat.     MEDICATIONS     Current Facility-Administered Medications   Medication Dose Route Frequency    apixaban  5 mg Oral Q12H    ARIPiprazole  10 mg Oral Daily    aspirin  81 mg Oral Daily    atorvastatin  80 mg Oral QHS    budesonide-formoterol  2 puff Inhalation BID    bumetanide  2 mg Oral BID    carvedilol  25 mg Oral Q12H SCH    docusate sodium  100 mg Oral QHS    DULoxetine  20 mg Oral Daily    folic acid  1 mg Oral Daily    insulin glargine  20 Units Subcutaneous Q12H    insulin lispro  1-3 Units Subcutaneous QHS    insulin  lispro  1-5 Units Subcutaneous TID AC    montelukast  10 mg Oral QHS    pregabalin  150 mg Oral BID    sertraline  50 mg Oral Daily    sodium bicarbonate  650 mg Oral BID    traZODone  50 mg Oral QHS     acetaminophen, albuterol, benzocaine-menthol, benzonatate, artificial tears (REFRESH PLUS), dextrose **OR** dextrose **OR** dextrose **OR** glucagon (rDNA), hydrALAZINE, melatonin, naloxone, nitroglycerin, ondansetron, saline     PHYSICAL EXAM     Vitals:    12/12/22 1505   BP: 157/72   Pulse: 75   Resp: 20   Temp: 97.5 F (36.4 C)   SpO2: 99%       Temperature: Temp  Min: 97.5 F (36.4 C)  Max: 98.8 F (37.1 C)  Pulse: Pulse  Min: 73  Max: 107  Respiratory: Resp  Min: 17  Max: 22  Non-Invasive BP: BP  Min: 130/64  Max: 211/97  Pulse Oximetry SpO2  Min: 95 %  Max: 100 %    Intake and Output Summary (Last 24 hours) at Date Time  No intake or output data in the 24 hours ending 12/12/22 1623    GEN APPEARANCE: Normal;  A&OX3  HEENT: PERLA; EOMI; Conjunctiva Clear  NECK: Supple; No bruits  CVS: RRR, S1, S2; No M/G/R  LUNGS: CTAB; No Wheezes; No Rhonchi: No rales  ABD: morbidly obese, Soft; No TTP; + Normoactive BS  EXT: No edema; Pulses 2+ and intact  NEURO: CN 2-12 intact; No Focal neurological deficits  MENTAL STATUS: responds appropriately, normal affect    Exam done by Hoy Register, DO on 12/12/22 at 4:23 PM      LABS     Recent Labs   Lab 12/12/22  0346 12/11/22  1749   WBC 14.82* 13.35*   RBC 2.98* 3.35*   Hgb 7.7* 8.5*   Hematocrit 26.3* 29.3*   MCV 88.3 87.5   Platelets 345 391*       Recent Labs   Lab 12/12/22  0346 12/11/22  1749   Sodium 136 135   Potassium 4.3 3.9   Chloride 110 106   CO2 18 20   BUN 46.0* 43.0*   Creatinine 2.7* 2.7*   Glucose 294* 383*   Calcium 7.3* 7.5*       Recent Labs   Lab 12/11/22  1749   ALT 10  AST (SGOT) 9   Bilirubin, Total 0.2   Albumin 2.5*   Alkaline Phosphatase 107       Recent Labs   Lab 12/12/22  1408 12/12/22  0346 12/11/22  1749   hs Troponin-I 26.9* 18.1* 14.0*              Microbiology Results (last 15 days)       Procedure Component Value Units Date/Time    COVID-19 (SARS-CoV-2) and Influenza A/B, NAA (Liat Rapid) [[id number]] Collected: 12/11/22 1749    Order Status: Completed Specimen: Culturette from Nasopharyngeal Updated: 12/11/22 1853     Purpose of COVID testing Diagnostic -PUI     SARS-CoV-2 Specimen Source Nasal Swab     SARS CoV 2 Overall Result Not Detected     Comment: __________________________________________________  -A result of "Detected" indicates POSITIVE for the    presence of SARS CoV-2 RNA  -A result of "Not Detected" indicates NEGATIVE for the    presence of SARS CoV-2 RNA  __________________________________________________________  Test performed using the Roche cobas Liat SARS-CoV-2 assay. This assay is  only for use under the Food and Drug Administration's Emergency Use  Authorization. This is a real-time RT-PCR assay for the qualitative  detection of SARS-CoV-2 RNA. Viral nucleic acids may persist in vivo,  independent of viability. Detection of viral nucleic acid does not imply the  presence of infectious virus, or that virus nucleic acid is the cause of  clinical symptoms. Negative results do not preclude SARS-CoV-2 infection and  should not be used as the sole basis for diagnosis, treatment or other  patient management decisions. Negative results must be combined with  clinical observations, patient history, and/or epidemiological information.  Invalid results may be due to inhibiting substances in the specimen and  recollection should occur. Please see Fact Sheets for patients and providers  located:  WirelessDSLBlog.no          Influenza A Not Detected     Influenza B Not Detected     Comment: Test performed using the Roche cobas Liat SARS-CoV-2 & Influenza A/B assay.  This assay is only for use under the Food and Drug Administration's  Emergency Use Authorization. This is a multiplex real-time RT-PCR  assay  intended for the simultaneous in vitro qualitative detection and  differentiation of SARS-CoV-2, influenza A, and influenza B virus RNA. Viral  nucleic acids may persist in vivo, independent of viability. Detection of  viral nucleic acid does not imply the presence of infectious virus, or that  virus nucleic acid is the cause of clinical symptoms. Negative results do  not preclude SARS-CoV-2, influenza A, and/or influenza B infection and  should not be used as the sole basis for diagnosis, treatment or other  patient management decisions. Negative results must be combined with  clinical observations, patient history, and/or epidemiological information.  Invalid results may be due to inhibiting substances in the specimen and  recollection should occur. Please see Fact Sheets for patients and providers  located: http://www.rice.biz/.         Narrative:      o Collect and clearly label specimen type:  o PREFERRED-Upper respiratory specimen: One Nasal Swab in  Transport Media.  o Hand deliver to laboratory ASAP  Diagnostic -PUI    MRSA culture [[id number]] Collected: 11/29/22 2113    Order Status: Completed Specimen: Culturette from Nasal Swab Updated: 11/30/22 2330     Culture MRSA Surveillance Negative for Methicillin Resistant Staph aureus    MRSA culture [  [id number]]  (Abnormal) Collected: 11/29/22 2113    Order Status: Completed Specimen: Culturette from Throat Updated: 11/30/22 2331     Culture MRSA Surveillance Positive for Methicillin Resistant Staph aureus    Urine culture [[id number]] Collected: 11/28/22 0940    Order Status: Completed Specimen: Urine, Clean Catch Updated: 11/29/22 1617    Narrative:      Indications for Urine Culture:->Costovertebral Angle  Tenderness  ORDER#: [id number]                                    ORDERED BY: CHINERY, ROGER  SOURCE: Urine, Clean Catch                           COLLECTED:  11/28/22 09:40  ANTIBIOTICS AT COLL.:                                 RECEIVED :  11/28/22 14:53  Culture Urine                              FINAL       11/29/22 16:17  11/29/22   1,000 - 9,000 CFU/ML of normal urogenital or skin microbiota, no further             work      Culture Blood Aerobic and Anaerobic [[id number]] Collected: 11/28/22 0806    Order Status: Completed Specimen: Blood, Venipuncture Updated: 12/03/22 1421    Narrative:      The order will result in two separate 8-2ml bottles  Please do NOT order repeat blood cultures if one has been  drawn within the last 48 hours  UNLESS concerned for  endocarditis  AVOID BLOOD CULTURE DRAWS FROM CENTRAL LINE IF POSSIBLE  Indications:->Sepsis  ORDER#: [id number]                                    ORDERED BY: GOARD, JODY  SOURCE: Blood, Venipuncture                          COLLECTED:  11/28/22 08:06  ANTIBIOTICS AT COLL.:                                RECEIVED :  11/28/22 11:31  Culture Blood Aerobic and Anaerobic        FINAL       12/03/22 14:21  12/03/22   No growth after 5 days of incubation.      Culture Blood Aerobic and Anaerobic [[id number]] Collected: 11/28/22 0806    Order Status: Completed Specimen: Blood, Venipuncture Updated: 12/03/22 1421    Narrative:      The order will result in two separate 8-69ml bottles  Please do NOT order repeat blood cultures if one has been  drawn within the last 48 hours  UNLESS concerned for  endocarditis  AVOID BLOOD CULTURE DRAWS FROM CENTRAL LINE IF POSSIBLE  Indications:->Sepsis  ORDER#: [id number]  ORDERED BY: GOARD, JODY  SOURCE: Blood, Venipuncture                          COLLECTED:  11/28/22 08:06  ANTIBIOTICS AT COLL.:                                RECEIVED :  11/28/22 11:31  Culture Blood Aerobic and Anaerobic        FINAL       12/03/22 14:21  12/03/22   No growth after 5 days of incubation.      COVID-19 (SARS-CoV-2) and Influenza A/B, NAA (Liat Rapid) [[id number]] Collected: 11/27/22 1956    Order Status: Completed Specimen: Culturette from  Nasopharyngeal Updated: 11/27/22 2026     Purpose of COVID testing Diagnostic -PUI     SARS-CoV-2 Specimen Source Nasal Swab     SARS CoV 2 Overall Result Not Detected     Comment: __________________________________________________  -A result of "Detected" indicates POSITIVE for the    presence of SARS CoV-2 RNA  -A result of "Not Detected" indicates NEGATIVE for the    presence of SARS CoV-2 RNA  __________________________________________________________  Test performed using the Roche cobas Liat SARS-CoV-2 assay. This assay is  only for use under the Food and Drug Administration's Emergency Use  Authorization. This is a real-time RT-PCR assay for the qualitative  detection of SARS-CoV-2 RNA. Viral nucleic acids may persist in vivo,  independent of viability. Detection of viral nucleic acid does not imply the  presence of infectious virus, or that virus nucleic acid is the cause of  clinical symptoms. Negative results do not preclude SARS-CoV-2 infection and  should not be used as the sole basis for diagnosis, treatment or other  patient management decisions. Negative results must be combined with  clinical observations, patient history, and/or epidemiological information.  Invalid results may be due to inhibiting substances in the specimen and  recollection should occur. Please see Fact Sheets for patients and providers  located:  https://www.benson-chung.com/          Influenza A Not Detected     Influenza B Not Detected     Comment: Test performed using the Roche cobas Liat SARS-CoV-2 & Influenza A/B assay.  This assay is only for use under the Food and Drug Administration's  Emergency Use Authorization. This is a multiplex real-time RT-PCR assay  intended for the simultaneous in vitro qualitative detection and  differentiation of SARS-CoV-2, influenza A, and influenza B virus RNA. Viral  nucleic acids may persist in vivo, independent of viability. Detection of  viral nucleic acid does not imply  the presence of infectious virus, or that  virus nucleic acid is the cause of clinical symptoms. Negative results do  not preclude SARS-CoV-2, influenza A, and/or influenza B infection and  should not be used as the sole basis for diagnosis, treatment or other  patient management decisions. Negative results must be combined with  clinical observations, patient history, and/or epidemiological information.  Invalid results may be due to inhibiting substances in the specimen and  recollection should occur. Please see Fact Sheets for patients and providers  located: http://olson-hall.info/.         Narrative:      o Collect and clearly label specimen type:  o PREFERRED-Upper respiratory specimen: One Nasal Swab in  Transport Media.  o Hand deliver to laboratory ASAP  Diagnostic -PUI  RADIOLOGY     XR Chest 2 Views    Result Date: 12/11/2022  1. No acute cardiopulmonary processes. Laurence Slate, MD 12/11/2022 5:48 PM    NM Lung Scan Perfusion w/Spect CT    Result Date: 11/28/2022   Normal perfusion lung scan. Ruby Cola, MD 11/28/2022 2:37 PM    Chest 2 Views    Result Date: 11/27/2022  1. No acute disease. Loel Ro, MD 11/27/2022 8:18 PM    [place] VenoDopp Low Extremity Left    Result Date: 11/20/2022   No evidence of left lower extremity DVT. Darnelle Bos, MD 11/20/2022 12:36 PM   Echo Results       None          No results found for this or any previous visit.        Sherlynn Stalls, DO  4:23 PM 12/12/2022

## 2022-12-12 NOTE — Respiratory Progress Note (Signed)
Respiratory Therapy Patient Assessment    A2813/A2813-A  12/12/22 12:20 AM  RT: Darrell Jewel, RT      Admitting DX: Demand ischemia [I24.89]  Hypertensive urgency [I16.0]  Severe persistent asthma with acute exacerbation [J45.51]    Pulmonary History: Asthma, Sleep Apnea (Does not wear CPAP machine)    Other Pulm Hx:      Therapy ordered:     IP Meds - Nasal and Inhaled (From admission, onward)      Start     Stop Status Route Frequency Ordered    12/11/22 2315  budesonide-formoterol (SYMBICORT) 80-4.5 MCG/ACT inhaler 2 puff         -- Dispensed IN RT - 2 times daily 12/11/22 2236    12/11/22 2236  albuterol (PROVENTIL) (2.5 MG/3ML) 0.083% nebulizer solution 2.5 mg         -- Dispensed NEBULIZATION RT - Every 6 hours as needed 12/11/22 2236             Subjective: Asthma PT able to take deep breath? Yes            Surgical Status: None  Airway: Natural   Mobility: Ambulatory with or without assistance  CXR: 2/1 Lateral CXR shows clear lungs    Cough Effort: Strong            Can clear secretions with cough? Yes  Can clear secretions with suctioning? N/A     Social History     Tobacco Use   Smoking Status Never   Smokeless Tobacco Never        Breath Sounds:  Bilateral Breath Sounds: Clear, Diminished          Heart Rate: 87 Resp Rate: 20  SpO2: 98 % O2 Device: None (Room air)          Home regimen:  Home Treatments: ALB PRN and Symbicort PRN  Home Oxygen: No   Home CPAP/BiLevel: No    Criteria for therapy:  Secretion Clearance: None indicated  Lung Expansion: None indicated  Medications: Hx of COPD, Asthma, Bronchitis or other documented RAD, Bronchospasms or documented bronchospasms in the last 24 hrs    Recommendations/Interventions:  Recommendations/Interventions: Bronchodilators     Expected Outcomes:        Meds: Decreased bronchospasms, Return to baseline home regimen      Re-Evaluation:  Follow-up Date:   Improving with Therapy: Yes    Plan of Care Recommendations:  Plan of Care: Symbicort BID and ALB  PRN

## 2022-12-12 NOTE — Progress Notes (Signed)
[facility] patient admitted to: [facility] Admission/Discharge Dates:   11/27/22 to 12/02/22     Reason for admission to hospital: Asthma exacerbation attacks     Name: CORRENE LALANI    ### Patient Details  Date of Birth: Mar 14, 1981  MRN: [id number]    ### Encounter Details  Arrival Date: 11/27/2022 07:38 PM EST  Discharge Date: 12/02/2022 04:26 PM EST  Encounter ID: [id number] TCM1/23/2024   4:26:00PM    ### Related interaction  [facility] (Post Discharge TCM Outreach 1) ([email or web address]    ### Required Interventions and Feedback     Call Status         Call Status:     No Attempt (edited by MF on 12/12/2022 01:07 PM EST)    Patient Readmitted:     Yes (edited by MF on 12/12/2022 01:07 PM EST)    Comments::     Upon review of medical records, patient readmitted to [facility] on 12/11/22 (edited by MF on 12/12/2022 01:07 PM EST)    Jetty Duhamel BSN, RN, CCM  RN Case Manager II  Ambulatory Care Management  [address] [place], [place] [address]  Ph: [phone]

## 2022-12-13 DIAGNOSIS — R0789 Other chest pain: Secondary | ICD-10-CM | POA: Diagnosis present

## 2022-12-13 DIAGNOSIS — I5033 Acute on chronic diastolic (congestive) heart failure: Secondary | ICD-10-CM | POA: Diagnosis present

## 2022-12-13 DIAGNOSIS — I161 Hypertensive emergency: Secondary | ICD-10-CM | POA: Diagnosis present

## 2022-12-13 DIAGNOSIS — R7989 Other specified abnormal findings of blood chemistry: Secondary | ICD-10-CM | POA: Diagnosis present

## 2022-12-13 LAB — CBC
Absolute NRBC: 0 10*3/uL (ref 0.00–0.00)
Hematocrit: 29.9 % — ABNORMAL LOW (ref 34.7–43.7)
Hgb: 8.7 g/dL — ABNORMAL LOW (ref 11.4–14.8)
MCH: 25.7 pg (ref 25.1–33.5)
MCHC: 29.1 g/dL — ABNORMAL LOW (ref 31.5–35.8)
MCV: 88.2 fL (ref 78.0–96.0)
MPV: 11.1 fL (ref 8.9–12.5)
Nucleated RBC: 0 /100 WBC (ref 0.0–0.0)
Platelets: 417 10*3/uL — ABNORMAL HIGH (ref 142–346)
RBC: 3.39 10*6/uL — ABNORMAL LOW (ref 3.90–5.10)
RDW: 15 % (ref 11–15)
WBC: 14 10*3/uL — ABNORMAL HIGH (ref 3.10–9.50)

## 2022-12-13 LAB — BASIC METABOLIC PANEL
Anion Gap: 8 (ref 5.0–15.0)
BUN: 56 mg/dL — ABNORMAL HIGH (ref 7.0–21.0)
CO2: 18 mEq/L (ref 17–29)
Calcium: 7.2 mg/dL — ABNORMAL LOW (ref 8.5–10.5)
Chloride: 109 mEq/L (ref 99–111)
Creatinine: 3.1 mg/dL — ABNORMAL HIGH (ref 0.4–1.0)
Glucose: 321 mg/dL — ABNORMAL HIGH (ref 70–100)
Potassium: 4.9 mEq/L (ref 3.5–5.3)
Sodium: 135 mEq/L (ref 135–145)
eGFR: 18.4 mL/min/{1.73_m2} — AB (ref >=60)

## 2022-12-13 LAB — HIGH SENSITIVITY TROPONIN-I
hs Troponin-I: 21.4 ng/L — AB
hs Troponin-I: 27.3 ng/L — AB

## 2022-12-13 LAB — WHOLE BLOOD GLUCOSE POCT
Whole Blood Glucose POCT: 193 mg/dL — ABNORMAL HIGH (ref 70–100)
Whole Blood Glucose POCT: 219 mg/dL — ABNORMAL HIGH (ref 70–100)
Whole Blood Glucose POCT: 230 mg/dL — ABNORMAL HIGH (ref 70–100)

## 2022-12-13 MED ORDER — APIXABAN 5 MG PO TABS
5.0000 mg | ORAL_TABLET | Freq: Two times a day (BID) | ORAL | 0 refills | Status: AC
Start: 2022-12-13 — End: 2024-08-18

## 2022-12-13 MED ORDER — CARVEDILOL 12.5 MG PO TABS
37.5000 mg | ORAL_TABLET | Freq: Two times a day (BID) | ORAL | 0 refills | Status: DC
Start: 2022-12-13 — End: 2022-12-13

## 2022-12-13 MED ORDER — HYDRALAZINE HCL 100 MG PO TABS
100.0000 mg | ORAL_TABLET | Freq: Two times a day (BID) | ORAL | 0 refills | Status: AC
Start: 2022-12-13 — End: 2024-01-09

## 2022-12-13 MED ORDER — BUMETANIDE 2 MG PO TABS
2.0000 mg | ORAL_TABLET | Freq: Two times a day (BID) | ORAL | 0 refills | Status: AC
Start: 2022-12-13 — End: 2024-08-18

## 2022-12-13 MED ORDER — ALBUTEROL SULFATE HFA 108 (90 BASE) MCG/ACT IN AERS
2.0000 | INHALATION_SPRAY | RESPIRATORY_TRACT | 0 refills | Status: DC | PRN
Start: 2022-12-13 — End: 2023-04-03

## 2022-12-13 MED ORDER — CARVEDILOL 25 MG PO TABS
25.0000 mg | ORAL_TABLET | Freq: Two times a day (BID) | ORAL | 0 refills | Status: DC
Start: 2022-12-13 — End: 2022-12-13

## 2022-12-13 MED ORDER — ATORVASTATIN CALCIUM 80 MG PO TABS
80.0000 mg | ORAL_TABLET | Freq: Every evening | ORAL | 0 refills | Status: AC
Start: 2022-12-13 — End: 2024-08-18

## 2022-12-13 MED ORDER — OLMESARTAN MEDOXOMIL 40 MG PO TABS
40.0000 mg | ORAL_TABLET | Freq: Every day | ORAL | 0 refills | Status: DC
Start: 2022-12-13 — End: 2022-12-13

## 2022-12-13 MED ORDER — CARVEDILOL 25 MG PO TABS
25.0000 mg | ORAL_TABLET | Freq: Two times a day (BID) | ORAL | 0 refills | Status: AC
Start: 2022-12-13 — End: 2023-01-12

## 2022-12-13 MED ORDER — TRAMADOL HCL 50 MG PO TABS
100.0000 mg | ORAL_TABLET | Freq: Once | ORAL | Status: DC | PRN
Start: 2022-12-13 — End: 2022-12-13

## 2022-12-13 MED ORDER — CARVEDILOL 25 MG PO TABS
37.5000 mg | ORAL_TABLET | Freq: Two times a day (BID) | ORAL | Status: DC
Start: 2022-12-13 — End: 2022-12-13
  Administered 2022-12-13: 37.5 mg via ORAL
  Filled 2022-12-13 (×2): qty 1

## 2022-12-13 MED ORDER — HYDRALAZINE HCL 50 MG PO TABS
100.0000 mg | ORAL_TABLET | Freq: Once | ORAL | Status: AC
Start: 2022-12-13 — End: 2022-12-13
  Administered 2022-12-13: 100 mg via ORAL
  Filled 2022-12-13: qty 2

## 2022-12-13 MED ORDER — BUDESONIDE-FORMOTEROL FUMARATE 80-4.5 MCG/ACT IN AERO
2.0000 | INHALATION_SPRAY | Freq: Two times a day (BID) | RESPIRATORY_TRACT | 0 refills | Status: AC
Start: 2022-12-13 — End: 2024-08-18

## 2022-12-13 NOTE — Consults (Signed)
Case management is unable to assist with medication authorizations. Patient will need to follow-up with her PCP or Pulmonologist. CM will continue to assess for further discharge needs.     Dorcas Mcmurray RN, BSN  Case Manager I  [phone]  [email or web address]

## 2022-12-13 NOTE — Consults (Addendum)
CARDIOLOGY HOSPITAL NOTE  Bari Edward, MD   [facility]  Office phone:  [phone]     [facility] phone [phone]  [facility] phone [phone]    Date:  12/13/2022  10:50 AM    Patient:  [name].  DOB: [birth year].  42 y.o.  female , MRN [id number]   Attending:  Audree Camel, DO   Admission date:  12/11/2022     ASSESSMENT & PLAN:     42 y.o. female with a history of chronic HFpEF, hypertension, dyslipidemia, CKD, DVT (initially in 2021, recurrence when briefly off of anticoagulation for a surgery in early 2023), asthma, sleep apnea but does not use CPAP, vertigo       Shortness of breath  Suspect asthma exacerbation    Severe hypertension:  In the setting of inability to fill hydralazine, currently improved  Hold hydralazine on discharge  Restart olmesartan if okay from nephrology standpoint  Increase carvedilol to 37.5 twice daily    HFpEF  Elevated BNP but currently not decompensated on exam.  On Bumex 2 mg twice daily will continue.  Lower extremity edema in the setting of renal insufficiency  I have asked her to take extra Bumex as needed with lower extremity edema      HPL  Tolerating atorvastatin    DM2 on insulin    DVT on Eliquis  history of DVT in 2021 possibly with PE at that time as well, and DVT recurrence in early 2023 when briefl people and like really doing it it does not help y off of anticoagulation for a hand surgery. Continues on Eliquis 5 mg twice daily as an outpatien     CKD  Creatinine currently at 3.1 unclear baseline    Anemia: Suspect anemia of chronic disease    Elevated Troponin's; Likely demand ischemia in setting of severe hypertension and elevated troponins  Currently with no angina    I reviewed all the available epic records in detail    We will sign off.\  Please call with questions.  HISTORY OF PRESENT ILLNESS:     42 y.o. female with a PMHx of  DVT on apixaban, CKD 4, DMII on insulin, HFpEF, HTN, HLD, admitted here 1/18 -  1/25 with Asthma exacerbation and severe hypertension presents with symptoms of chest tightness, shortness of breath and severe hypertension      Patient tells me she had some residual lower extremity edema when she was discharged and she has been compliant with Bumex 2 mg twice daily  About 3 days after discharge she experienced chest tightness, shortness of breath and wheezing irrespective of exertion since she was unable to get her Symbicort filled  She also experienced orthopnea and at baseline she uses about 7 pillows at night  Denies any PND episodes  She also tells me her blood pressure at home was running high in the 180s to 190s since she ran out of hydralazine which she was taking 3 times a day    Since admission she received IV hydralazine and IV Lasix along with nebulization with improved symptoms  Chest x-ray with no overt edema    Improved symptoms today    MEDICATIONS:    INFUSION MEDS:      SCHEDULED MEDS:     Current Facility-Administered Medications   Medication Dose Route Frequency    apixaban  5 mg Oral Q12H    ARIPiprazole  10 mg Oral Daily    aspirin  81  mg Oral Daily    atorvastatin  80 mg Oral QHS    budesonide-formoterol  2 puff Inhalation BID    bumetanide  2 mg Oral BID    carvedilol  37.5 mg Oral Q12H SCH    docusate sodium  100 mg Oral QHS    DULoxetine  20 mg Oral Daily    folic acid  1 mg Oral Daily    insulin glargine  20 Units Subcutaneous Q12H    insulin lispro  1-3 Units Subcutaneous QHS    insulin lispro  1-5 Units Subcutaneous TID AC    montelukast  10 mg Oral QHS    pregabalin  150 mg Oral BID    sertraline  50 mg Oral Daily    sodium bicarbonate  650 mg Oral BID    traZODone  50 mg Oral QHS      PRN MEDS:   acetaminophen, albuterol, benzocaine-menthol, benzonatate, artificial tears (REFRESH PLUS), dextrose **OR** dextrose **OR** dextrose **OR** glucagon (rDNA), hydrALAZINE, melatonin, naloxone, nitroglycerin, ondansetron, saline, traMADol    ALLERGIES:     Allergies   Allergen  Reactions    Amlodipine Hives    Bactrim [Sulfamethoxazole-Trimethoprim] Hives, Respiratory Distress and Edema    Glipizide Edema     Throat swelling    Lisinopril Edema     Tongue swelling    Losartan Other (See Comments), Swelling and Edema        DIAGNOSTICS:     ECG:  (I independently reviewed the ECG tracing)  Normal sinus rhythm at 90 bpm.  LVH nonspecific ST changes    Telemetry:  (I independently reviewed the telemetry data)  Normal sinus rhythm    Chest -Xray:( I independently reviewed the chest x-ray images )  12/11/2022:  No acute cardiopulmonary processes.       Echocardiogram: (I personally reviewed echo report)  02/04/2022:  * The left ventricle is normal in size.    * There is mild concentric left ventricular hypertrophy.    * Left ventricular systolic function is normal with an ejection fraction by  Biplane Method of Discs of  65 %.    * Left ventricular segmental wall motion is grossly normal, but subtle wall  motion abnormalities cannot be excluded as technically limited study.    * Left ventricular diastolic filling parameters are consistent with Grade I  diastolic dysfunction (impaired relaxation pattern).    * No significant valvular dysfunction.    * Compared to the prior study, there has been no significant change.    LABS:   Labs reviewed personally:    CBC w/Diff   Recent Labs   Lab 12/13/22  0911 12/12/22  0346 12/11/22  1749   WBC 14.00* 14.82* 13.35*   Hgb 8.7* 7.7* 8.5*   Hematocrit 29.9* 26.3* 29.3*   Platelets 417* 345 391*        Basic Metabolic Profile   Recent Labs   Lab 12/13/22  0219 12/12/22  0346 12/11/22  1749   Sodium 135 136 135   Potassium 4.9 4.3 3.9   Chloride 109 110 106   CO2 18 18 20    BUN 56.0* 46.0* 43.0*   Creatinine 3.1* 2.7* 2.7*   eGFR 18.4* 21.7* 21.7*   Glucose 321* 294* 383*   Calcium 7.2* 7.3* 7.5*        Cardiac Enzymes   Recent Labs   Lab 12/13/22  0911 12/13/22  0219 12/12/22  2007   hs Troponin-I 27.3* 21.4* 21.4*  Recent Labs   Lab 12/11/22  1749    NT-proBNP 2,288*        D-dimer          Thyroid Studies         Invalid input(s): "FREET4"     Cholesterol Panel          Coagulation Studies            PAST MEDICAL HISTORY:     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     Diabetes mellitus     Fibromyalgia     Gastroesophageal reflux disease     Hyperlipidemia     Hypertension     Neuropathy     Pulmonary embolism 08/2019    Renal insufficiency     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 kr    CESAREAN SECTION      x 2    CHOLECYSTECTOMY      EYE SURGERY Bilateral     cataract    HYSTERECTOMY  12/2019    RELEASE, CARPAL TUNNEL Left 07/10/2022    Procedure: RELEASE, CARPAL TUNNEL;  Surgeon: Romilda Joy, MD;  Location: [facility];  Service: Plastics;  Laterality: Left;    REPAIR, UPPER EXTREMITY, TENDON & NERVE Right 12/05/2021    Procedure: REPAIR, RIGHT ULNAR NERVE AND NEUROPLASTY @ ELBOW AND WRIST, RIGHT OPEN CARPAL TUNNEL RELEASE;  Surgeon: Romilda Joy, MD;  Location: [facility];  Service: Plastics;  Laterality: Right;    REPAIR, UPPER EXTREMITY, TENDON & NERVE Left 07/10/2022    Procedure: LEFT ULNAR NERVE NEUROPLASTY AT ELBOW AND WRIST, LEFT OPEN CARPAL TUNNEL RELEASE;  Surgeon: Romilda Joy, MD;  Location: [facility];  Service: Plastics;  Laterality: Left;    UTERINE RUPTURE         SOCIAL HISTORY:     Social History     Tobacco  Use    Smoking status: Never    Smokeless tobacco: Never   Substance Use Topics    Alcohol use: Never       FAMILY HISTORY:   family history includes Asthma in her father and son; Cancer in her maternal aunt and maternal grandmother; Diabetes in her daughter and mother; Heart disease in her mother; Hypertension in her father and mother.    REVIEW OF SYSTEMS:     FLUID BALANCE AND WEIGHT:  Last filed weight: Weight: 145 kg (319 lb 10.7 oz)  Weight on Admission: Weight: 145 kg (319 lb 10.7 oz)  Net IO Since Admission: No IO data has been entered for this period [12/13/22 1050]   No intake or output data in the 24 hours ending 12/13/22 0700Weight change:     Last 3 Weights for the past 72 hrs (Last 3 readings):   Weight   12/11/22 1712 145 kg (319 lb 10.7 oz)        PHYSICAL EXAM:                                                                 BP 163/72   Pulse 76   Temp 98.4 F (36.9 C) (Oral)   Resp 15   Wt 145 kg (319 lb 10.7 oz)   SpO2 98%   BMI 51.60 kg/m   General Appearance: Well-appearing in no acute distress.   HEENT: normocephalic, atraumatic  LUNGS: Breathing comfortably without accessory muscle use.   Diminished air entry bilaterally  No wheezes or rales  CARDIOVASCULAR:   JVP normal, No hepatojugular reflux.  Carotids no bruits.  Apical impulse non-displaced, no right ventricular heave or lift.  Regular rate and rhythm. Normal S1, S2; no S3, S4. No murmurs, rubs, or gallops.    ABDOMEN:  normal bowel sounds,soft, non-tender and non distended; no rebound or guarding  EXTREMITIES: Warm and well perfused. and trace edema noted bilaterally.+ DP pulses bilaterally.   NEURO: Alert and oriented x3. Grossly intact.  Normal mood and affect.     MISCELLANEOUS:                                                            DISCL   This note was generated by the Epic EMR system/ Dragon speech recognition and may contain inherent errors or omissions not intended by the user.  Grammatical errors, random word insertions, deletions and pronoun errors  are occasional consequences of this technology due to software limitations. Not all errors are caught or corrected. If there are questions or concerns about the content of this note or information contained within the body of this dictation they should be addressed directly with the author for clarification

## 2022-12-13 NOTE — Discharge Summary (Addendum)
[facility]      Patient: [name]  Admission Date: 12/11/2022   DOB: Oct 05, 1981  Discharge Date: 12/13/2022    MRN: [id number]  Discharge Attending:Jaline Pincock Linda Hedges, DO     Referring Physician: Adah Perl, DO  PCP: Adah Perl, DO       DISCHARGE SUMMARY     Discharge Information   Admission Diagnosis:   Other chest pain    Discharge Diagnosis:   Active Hospital Problems    Diagnosis    Hypertensive emergency    Elevated troponin    Acute on chronic diastolic CHF (congestive heart failure)    Other chest pain    Severe persistent asthma with acute exacerbation        Discharge Condition: stable, improved  Consultants: [facility] Cardiology  Functional Status: ambulatory  Discharged to: home       Hospital Course   Presentation History   [name] is a 42 y.o. 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 She was previously on dulera, but states it was not effective and she believes symbicort has given her the most relief from symptoms. Work up in the ED was significant for Cr and anemia at baseline. Trop midly elevated ( 14) and BNP >2200. CXR without acute findings. EKG without ischemia. She required multiple doses of hydralazine and lasix in the ED in order to control her blood pressure as well as albuterol nebs for wheezing. She is admitted for observation to hospitalist service.     See HPI for details.    Hospital Course (0 Days)     [name] is a 41 y.o. 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         Patient Active Hospital Problem List:     #Dyspnea on exertion, Orthopnea, and Chest pain concerning for mild acute on chronic diastolic CHF in setting of medication non-adherence (ran out of medications at home) +/- mild acute asthma exacerbation.   - last echo in EMR 01/2022 with LVEF 65%, G1DD, no significant valvular dysfunction.  - CXR reporting neg for acute process  - elevated probnp  - Repeat TTE ordered but not yet performed... patient may follow up with cardiologist outpatient to have this done.   - monitored on telemetry x24 hours, no events reported  - Given IV lasix 80mg  in ED on arrival, then restarted on home dose PO bumex.   - continue appropriate home meds- bumex, coreg, aspirin, lipitor.   - IMG cardiology consulted, appreciate recs- increased Coreg dose. Recommended olmesartan be restarted but patient's nephrologist told her to stop this med due to CKD... I will not restart and will defer to outpatient pcp/cardio/nephro for further medication additions or adjustments.      #Hypertensive Emergency on arrival, resolved  #Chronic hypertension  - likely secondary to running out of home BP meds  - resumed/adjusted appropriate home BP meds with  improvement  - monitor vitals     #Elevated troponin  - Likely demand ischemia due to above uncontrolled hypertension  - trop trended down. EKG without acute ischemia.   - [facility] Cardiology consulted- adjusting BP meds. Will need outpatient cardio follow up.      #Reactive leukocytosis?  - unclear evidence of infection. She does have some dyspnea but no wheeze, CXR neg for acute process, covid/flu screen neg. Patient does not appear ill, dyspneic on conversation or with ambulating, she denies cough, congestion, rhinorrhea, fever/chills, sick contacts.   - trend WBC- remaining stable. Continue to monitor off antibiotics.     #Chronic asthma  #OSA/OHS  - patient reports having been  formally tested and diagnose w/OSA but does not have home cpap due to insurance issues  - prn nebs     #Dyslipidemia  - continue appropriate home med statin     #DM type 2 w/hyperglycemia  - continue home scheduled levemir  - ISS w/accuchecks  - diabetic diet     #CKD4, stable at baseline  - monitor, avoid nephrotoxic agents when possible     #Anemia of chronic disease  - H&H stable, monitor     #Hx DVT  - continue home eliquis     #Morbid obesity, BMI 51.6  - counseled lifestyle modifications for weight loss  - patient reports having an appointment with bariatric surgery in near future for further evaluation.     #Medication non-adherence  - ran out of prescription medication and unable to get refills outpatient.   - counseled on importance of refilling meds and taking as directed     Nutrition: carb consistent heart healthy, fluid restrict         DVT Prophylaxis:eliquis      Patient has BMI=Body mass index is 51.6 kg/m.  Diagnosis: Obesity Class 3 (formerly known as Morbid Obesity) based on BMI criteria        Code Status: full     Dispo: Medically cleared and cleared by cardiology for discharge home. Refills on home rx for BP and cardiac meds e-prescribed. Follow up with PCP and Cardiology outpatient.      Family Contact: daughter on phone while I was at patients bedside            Progress Note/Physical Exam at Discharge     Subjective: s/e, vs stable, no new complaints, no distress, feels ok, ambulated in hallways with daughter lastnight without difficulty. Tolerating diet.     Vitals:    12/13/22 0731 12/13/22 0749 12/13/22 0800 12/13/22 1148   BP:  163/72  118/61   Pulse: 73 76  75   Resp:  15  14   Temp:  98.4 F (36.9 C)  97.7 F (36.5 C)   TempSrc:  Oral  Oral   SpO2: 98% 98%  99%   Weight:       Height:   1.676 m (5' 5.98")          General: NAD, AAOx3  HEENT: perrla, eomi, sclera anicteric, OP: Clear, MMM  Neck: supple, FROM, no LAD  Cardiovascular: RRR, no m/r/g  Lungs: CTAB, no w/r/r  Abdomen:  morbidly obese, soft, +BS, NT/ND, no masses, no g/r  Extremities: no C/C/E  Skin: no rashes or lesions noted  Neuro: CN 2-12 intact; No Focal neurological deficits       Diagnostics     Labs/Studies Pending at Discharge: No    Last Labs   Recent Labs   Lab  12/13/22  0911 12/12/22  0346 12/11/22  1749   WBC 14.00* 14.82* 13.35*   RBC 3.39* 2.98* 3.35*   Hgb 8.7* 7.7* 8.5*   Hematocrit 29.9* 26.3* 29.3*   MCV 88.2 88.3 87.5   Platelets 417* 345 391*       Recent Labs   Lab 12/13/22  0219 12/12/22  0346 12/11/22  1749   Sodium 135 136 135   Potassium 4.9 4.3 3.9   Chloride 109 110 106   CO2 18 18 20    BUN 56.0* 46.0* 43.0*   Creatinine 3.1* 2.7* 2.7*   Glucose 321* 294* 383*   Calcium 7.2* 7.3* 7.5*       Microbiology Results (last 15 days)       Procedure Component Value Units Date/Time    COVID-19 (SARS-CoV-2) and Influenza A/B, NAA (Liat Rapid) [[id number]] Collected: 12/11/22 1749    Order Status: Completed Specimen: Culturette from Nasopharyngeal Updated: 12/11/22 1853     Purpose of COVID testing Diagnostic -PUI     SARS-CoV-2 Specimen Source Nasal Swab     SARS CoV 2 Overall Result Not Detected     Comment: __________________________________________________  -A result of "Detected" indicates POSITIVE for the    presence of SARS CoV-2 RNA  -A result of "Not Detected" indicates NEGATIVE for the    presence of SARS CoV-2 RNA  __________________________________________________________  Test performed using the Roche cobas Liat SARS-CoV-2 assay. This assay is  only for use under the Food and Drug Administration's Emergency Use  Authorization. This is a real-time RT-PCR assay for the qualitative  detection of SARS-CoV-2 RNA. Viral nucleic acids may persist in vivo,  independent of viability. Detection of viral nucleic acid does not imply the  presence of infectious virus, or that virus nucleic acid is the cause of  clinical symptoms. Negative results do not preclude SARS-CoV-2 infection and  should not be used as the  sole basis for diagnosis, treatment or other  patient management decisions. Negative results must be combined with  clinical observations, patient history, and/or epidemiological information.  Invalid results may be due to inhibiting substances in the specimen and  recollection should occur. Please see Fact Sheets for patients and providers  located:  https://www.benson-chung.com/          Influenza A Not Detected     Influenza B Not Detected     Comment: Test performed using the Roche cobas Liat SARS-CoV-2 & Influenza A/B assay.  This assay is only for use under the Food and Drug Administration's  Emergency Use Authorization. This is a multiplex real-time RT-PCR assay  intended for the simultaneous in vitro qualitative detection and  differentiation of SARS-CoV-2, influenza A, and influenza B virus RNA. Viral  nucleic acids may persist in vivo, independent of viability. Detection of  viral nucleic acid does not imply the presence of infectious virus, or that  virus nucleic acid is the cause of clinical symptoms. Negative results do  not preclude SARS-CoV-2, influenza A, and/or influenza B infection and  should not be used as the sole basis for diagnosis, treatment or other  patient management decisions. Negative results must be combined with  clinical observations, patient history, and/or epidemiological information.  Invalid results may be due to inhibiting substances in the specimen and  recollection should occur. Please see Fact Sheets for patients and providers  located: http://olson-hall.info/.         Narrative:      o Collect and clearly label specimen type:  o PREFERRED-Upper respiratory specimen: One Nasal Swab in  Transport Media.  o Hand deliver to laboratory ASAP  Diagnostic -PUI    MRSA culture [[id number]] Collected: 11/29/22 2113    Order Status: Completed Specimen: Culturette from Nasal Swab Updated: 11/30/22 2330     Culture MRSA Surveillance Negative for Methicillin  Resistant Staph aureus    MRSA culture [[id number]]  (Abnormal) Collected: 11/29/22 2113    Order Status: Completed Specimen: Culturette from Throat Updated: 11/30/22 2331     Culture MRSA Surveillance Positive for Methicillin Resistant Staph aureus            Procedures/Imaging:   Upon my review: XR Chest 2 Views    Result Date: 12/11/2022  1. No acute cardiopulmonary processes. Kristine Linea, MD 12/11/2022 5:48 PM       Patient Instructions   Discharge Diet: diabetic heart healthy  Discharge Activity: as tolerated    Follow Up Appointment:   Follow-up Information       Alyson Locket, DO. Schedule an appointment as soon as possible for a visit in 1 week(s).    Specialty: Family Medicine  Contact information:  [address] [place]  [place] [address]  [phone]               Laqueta Jean, MD. Schedule an appointment as soon as possible for a visit in 1 week(s).    Specialty: Cardiology  Contact information:  [address]  [address] [address]  [phone]               Delice Bison, MD. Schedule an appointment as soon as possible for a visit in 1 week(s).    Specialties: Internal Medicine, Nephrology  Contact information:  [address]  [place] [address]  [phone]                             Discharge Medications:     Medication List        CHANGE how you take these medications      * albuterol sulfate HFA 108 (90 Base) MCG/ACT inhaler  Commonly known as: PROVENTIL  Inhale 2 puffs into the lungs every 4 (four) hours as needed for Wheezing  What changed: Another medication with the same name was added. Make sure you understand how and when to take each.     * albuterol sulfate HFA 108 (90 Base) MCG/ACT inhaler  Commonly known as: PROVENTIL  Inhale 2 puffs into the lungs every 4 (four) hours as needed for Wheezing  What changed: You were already taking a medication with the same name, and this prescription was added. Make sure you understand how and when to take  each.     atorvastatin 80 MG tablet  Commonly known as: LIPITOR  Take 1 tablet (80 mg) by mouth nightly  What changed: medication strength     carvedilol 25 MG tablet  Commonly known as: COREG  Take 1 tablet (25 mg) by mouth 2 (two) times daily with meals  What changed: Another medication with the same name was removed. Continue taking this medication, and follow the directions you see here.     hydrALAZINE 100 MG tablet  Commonly known as: APRESOLINE  Take 1 tablet (100 mg) by mouth 2 (two) times daily  What changed:   when to take this  Another medication with the same name was removed. Continue taking  this medication, and follow the directions you see here.           * This list has 2 medication(s) that are the same as other medications prescribed for you. Read the directions carefully, and ask your doctor or other care provider to review them with you.                CONTINUE taking these medications      Alcohol Swabs Pads  Use as indicated for insulin injections     apixaban 5 MG  Commonly known as: ELIQUIS  Take 1 tablet (5 mg) by mouth every 12 (twelve) hours     ARIPiprazole 5 MG tablet  Commonly known as: ABILIFY     aspirin 81 MG chewable tablet     budesonide-formoterol 80-4.5 MCG/ACT inhaler  Commonly known as: SYMBICORT  Inhale 2 puffs into the lungs 2 (two) times daily     bumetanide 2 MG tablet  Commonly known as: BUMEX  Take 1 tablet (2 mg) by mouth 2 (two) times daily     diclofenac Sodium 1 % Gel topical gel  Commonly known as: VOLTAREN  Apply 2 g topically 4 (four) times daily     docusate sodium 100 MG capsule  Commonly known as: COLACE     DULoxetine 20 MG capsule  Commonly known as: CYMBALTA     EPINEPHrine 0.15 MG/0.3ML injection     Fluticasone Propionate Diskus 50 MCG/ACT Aepb     folic acid 1 MG tablet  Commonly known as: FOLVITE     insulin lispro 100 UNIT/ML injection  Commonly known as: HumaLOG  Inject 25 Units into the skin 3 (three) times daily before meals     Insulin Syringe-Needle  U-100 27G X 1/2" 1 ML Misc  Inject 1 Dose into the skin 4 (four) times daily     Levemir 100 UNIT/ML injection  Generic drug: insulin detemir  Inject 50 Units into the skin every 12 (twelve) hours     montelukast 10 MG tablet  Commonly known as: SINGULAIR     pregabalin 150 MG capsule  Commonly known as: LYRICA     sertraline 50 MG tablet  Commonly known as: ZOLOFT     sodium bicarbonate 650 MG tablet     traZODone 50 MG tablet  Commonly known as: DESYREL            STOP taking these medications      olmesartan 40 MG tablet  Commonly known as: BENICAR               Where to Get Your Medications        These medications were sent to [facility] [address] [place]  [place], [place] [address]      Phone: [phone]   albuterol sulfate HFA 108 (90 Base) MCG/ACT inhaler  apixaban 5 MG  atorvastatin 80 MG tablet  budesonide-formoterol 80-4.5 MCG/ACT inhaler  bumetanide 2 MG tablet  carvedilol 25 MG tablet  hydrALAZINE 100 MG tablet             Time spent examining patient, discussing with patient/family regarding hospital course, chart review, reconciling medications and discharge planning: 40 minutes.    Signed,  Hoy Register, DO    2:50 PM 12/13/2022

## 2022-12-13 NOTE — Progress Notes (Signed)
Assessment Smart phrase for obs and low risk patient     Introduced [employer] and self to patient and/or family Chemical engineer (name and contact tel #)  [name] [phone]  Discussed plan of care and possible discharge needs with patient/family/care-giver (Y) - if family/care-giver, provide name:    Patient has cognitive ability to participate in care decisions and follow-up care as needed (Y) - provide comments if no:    Verified (and corrected if needed) demographics and PCP on facesheet (Y)    Assessed for lack of insurance or underinsured. (Y)  If no insurance or underinsured, patient is high risk and needs high risk assessment     Patient is NOT identified as a Medicare focused patient or high risk for failed discharge plan or readmission as detailed in the High Risk Patient Screening policy (Y)    Plan of care:     Discharge Plan A: Home with no needs. Family to transport.   Discharge Plan B:    Expected discharge date:02/03    Barriers to discharge:              a) Barriers needing escalation:              b) Escalated to:       Tentative d/c date and CM # on white board:    Comments:      Lynn Mcmurray RN, BSN  Case Manager I  [phone]  [name]

## 2022-12-13 NOTE — Plan of Care (Signed)
Patient discharged to home via wheelchair accompanied by hospital transport team with all the belongings pt came with. Discharge instructions and follow up plan explained to pt and she demonstrated understanding, no further questions at this time. IV line removed, per VS administered meds before left the room. patient's transport (daughter) picking up pt at the patient entrance    Problem: Pain interferes with ability to perform ADL  Goal: Pain at adequate level as identified by patient  12/13/2022 0750 by Lucia Gaskins, RN  Outcome: Progressing  12/13/2022 0748 by Lucia Gaskins, RN  Outcome: Progressing  Flowsheets (Taken 12/12/2022 2019 by Aquilla Solian, RN)  Pain at adequate level as identified by patient:   Identify patient comfort function goal   Assess for risk of opioid induced respiratory depression, including snoring/sleep apnea. Alert healthcare team of risk factors identified.   Assess pain on admission, during daily assessment and/or before any "as needed" intervention(s)   Reassess pain within 30-60 minutes of any procedure/intervention, per Pain Assessment, Intervention, Reassessment (AIR) Cycle   Evaluate if patient comfort function goal is met   Evaluate patient's satisfaction with pain management progress   Offer non-pharmacological pain management interventions     Problem: Side Effects from Pain Analgesia  Goal: Patient will experience minimal side effects of analgesic therapy  12/13/2022 0750 by Lucia Gaskins, RN  Outcome: Progressing  12/13/2022 0748 by Lucia Gaskins, RN  Outcome: Progressing  Harrison (Taken 12/12/2022 2019 by Aquilla Solian, RN)  Patient will experience minimal side effects of analgesic therapy:   Monitor/assess patient's respiratory status (RR depth, effort, breath sounds)   Assess for changes in cognitive function   Evaluate for opioid-induced sedation with appropriate assessment tool (i.e. POSS)     Problem: Renal Instability  Goal: Fluid and electrolyte balance are  achieved/maintained  12/13/2022 0750 by Lucia Gaskins, RN  Outcome: Progressing  12/13/2022 0748 by Lucia Gaskins, RN  Outcome: Progressing  Flowsheets (Taken 12/12/2022 2019 by Aquilla Solian, RN)  Fluid and electrolyte balance are achieved/maintained:   Monitor/assess lab values and report abnormal values   Assess and reassess fluid and electrolyte status   Observe for cardiac arrhythmias   Monitor for muscle weakness  Goal: Nutritional intake is adequate  12/13/2022 0750 by Lucia Gaskins, RN  Outcome: Progressing  12/13/2022 Starr by Lucia Gaskins, RN  Outcome: Progressing  Goal: Perineal skin integrity is maintained or improved and remains free from infection  12/13/2022 0750 by Lucia Gaskins, RN  Outcome: Progressing  12/13/2022 0748 by Lucia Gaskins, RN  Outcome: Progressing     Problem: Hemodynamic Status: Cardiac  Goal: Stable vital signs and fluid balance  Outcome: Progressing  Flowsheets (Taken 12/13/2022 0750)  Stable vital signs and fluid balance:   Assess signs and symptoms associated with cardiac rhythm changes   Monitor lab values     Problem: Day of Admission - Heart Failure  Goal: Heart Failure Admission  Outcome: Progressing  Flowsheets (Taken 12/13/2022 0750)  Heart Failure Admission:   Standing Weight on admission, if unable to stand zero the bed and use the bed scale   Vital signs and telemetry per policy   Strict Intake/Output   Fluid restriction   Oxygen as needed   Assess for swelling/edema and document     Problem: Everyday - Heart Failure  Goal: Stable Vital Signs and Fluid Balance  Outcome: Progressing  Flowsheets (Taken 12/13/2022 0750)  Stable Vital Signs and Fluid Balance:   Daily Standing Weights in the morning using the same scale,  after using the bathroom and before breadfast.  If unable to stand, zero the bed and use the bed scale   Monitor, assess vital signs and telemetry per policy   Monitor labs and report abnormalities to physician   Strict Intake/Output   Fluid Restriction   Assess  for swelling/edema  Goal: Mobility/Activity is Maintained at Optimal Level for Patient  Outcome: Progressing  Flowsheets (Taken 12/13/2022 0750)  Mobility/Activity is Maintained at Optimal Level for Patient:   Increase mobility as tolerated/progressive mobility protocol   Consult with Physical Therapy and/or Occupational Therapy  Goal: Nutritional Intake is Adequate  Outcome: Progressing  Flowsheets (Taken 12/13/2022 0750)  Nutritional Intake is Adequate:   Cardiac diet-2 gm Sodium   Fluid Restricction if needed   Consult/Collaborate with Nutritionist   Patient and family teaching on low sodium diet  Goal: Teaching-Using CHF Warning Zones and Educational Videos  Outcome: Progressing  Flowsheets (Taken 12/13/2022 0750)  Teaching-Using CHF Warning Zones and Educational Videos:   Signs & Symptoms of CHF   Daily Standing Weights & record   CHF Warning Zones and when to call for help   Medications   Sodium Restriction   Fluid Restriction if appropriate   Document in the Education Tab in EPIC with Teach-back

## 2022-12-13 NOTE — Progress Notes (Signed)
Home with no needs. Family to transport. RN aware. Patient notified.    12/13/22 1618   Discharge Disposition   Patient preference/choice provided? Yes   Physical Discharge Disposition Home   Mode of Transportation Car   Patient/Family/POA notified of transfer plan Yes   Patient agreeable to discharge plan/expected d/c date? Yes   Family/POA agreeable to discharge plan/expected d/c date? Yes   Bedside nurse notified of transport plan? Yes   CM Interventions   Follow up appointment scheduled? No   Reason no follow up scheduled? Family to schedule   Notified MD? Yes   Multidisciplinary rounds/family meeting before d/c? Yes   Medicare Checklist   Is this a Medicare patient? No     Dorcas Mcmurray RN, BSN  Case Manager I  [phone]  [email or web address]

## 2022-12-15 NOTE — Progress Notes (Signed)
[facility] patient admitted to:   [facility] Admission/Discharge Dates:   2/1-01/2023     Reason for admission to hospital: chest pain    Name: [name]    ### Patient Details  Date of Birth: 1981-05-13  MRN: [id number]    ### Encounter Details  Arrival Date: 12/11/2022 05:27 PM EST  Discharge Date: 12/13/2022 05:24 PM EST  Encounter ID: [id number] TCM2/01/2023   5:24:00PM    ### Related interaction  [facility] (Post Discharge TCM Outreach 1) ([email or web address]    ### Questions     Question 1   Rx Obtained   Have you been able to get all of your prescribed medications? Please press 1 if yes, press 2 if no, or press 3 if no medications were prescribed.    Does Not Have Rx (Issue Panel: Post Discharge - TCM)     Question 2   Supplies   Do you have all the medical supplies needed for a successful recovery?  Press 1 for Yes,  Press 2 for No   Does not have supplies (Issue Panel: Post Discharge - TCM)    ### Required Interventions and Feedback     Call Status         Comments::     Case manager placed call to patient today at  [phone] and reached voice mail. CM left detailed message for patient to return call to this CM at [phone] if they have any needs.   Will try again tomorrow. (edited by GG on 12/15/2022 12:00 PM EST)     General Status - Actions Taken         Other (Add Details in Comments):     Yes (edited by GG on 12/15/2022 11:59 AM EST)    Comments:     Case manager placed call to patient today at  [phone] and reached voice mail. CM left detailed message for patient to return call to this CM at [phone] if they have any needs.  (edited by GG on 12/15/2022 11:59 AM EST)    Angelena Sole, CCM  Case Manager  [facility]  [address], [place], [place] [address]  Ph.: [phone]

## 2022-12-16 NOTE — Progress Notes (Signed)
Ambulatory Care Management- outreach call    2nd attempt      CM placed call to patient at [phone] (home)  to engage in post hospital follow up and reached voice mail. Left message for patient to return call to this CM at [phone].     Jilda Roche, [place], CCM. [place]  Case Manager  [facility]  [address] [place], [place] [address]  Ph.: [phone]

## 2023-04-02 ENCOUNTER — Encounter (INDEPENDENT_AMBULATORY_CARE_PROVIDER_SITE_OTHER): Payer: Self-pay

## 2023-04-03 ENCOUNTER — Emergency Department: Payer: Medicaid Other

## 2023-04-03 ENCOUNTER — Emergency Department
Admission: EM | Admit: 2023-04-03 | Discharge: 2023-04-03 | Disposition: A | Discharge status: Home / Self Care | Payer: Medicaid Other | Attending: Student in an Organized Health Care Education/Training Program | Admitting: Student in an Organized Health Care Education/Training Program

## 2023-04-03 DIAGNOSIS — R079 Chest pain, unspecified: Secondary | ICD-10-CM

## 2023-04-03 DIAGNOSIS — R0602 Shortness of breath: Secondary | ICD-10-CM | POA: Insufficient documentation

## 2023-04-03 DIAGNOSIS — Z794 Long term (current) use of insulin: Secondary | ICD-10-CM | POA: Insufficient documentation

## 2023-04-03 DIAGNOSIS — I509 Heart failure, unspecified: Secondary | ICD-10-CM | POA: Insufficient documentation

## 2023-04-03 DIAGNOSIS — E1122 Type 2 diabetes mellitus with diabetic chronic kidney disease: Secondary | ICD-10-CM | POA: Insufficient documentation

## 2023-04-03 DIAGNOSIS — N183 Chronic kidney disease, stage 3 unspecified: Secondary | ICD-10-CM | POA: Insufficient documentation

## 2023-04-03 DIAGNOSIS — I130 Hypertensive heart and chronic kidney disease with heart failure and stage 1 through stage 4 chronic kidney disease, or unspecified chronic kidney disease: Secondary | ICD-10-CM | POA: Insufficient documentation

## 2023-04-03 LAB — CBC AND DIFFERENTIAL
Absolute NRBC: 0 10*3/uL (ref 0.00–0.00)
Basophils Absolute Automated: 0.07 10*3/uL (ref 0.00–0.08)
Basophils Automated: 0.5 %
Eosinophils Absolute Automated: 0.31 10*3/uL (ref 0.00–0.44)
Eosinophils Automated: 2.4 %
Hematocrit: 31.8 % — ABNORMAL LOW (ref 34.7–43.7)
Hgb: 9.5 g/dL — ABNORMAL LOW (ref 11.4–14.8)
Immature Granulocytes Absolute: 0.15 10*3/uL — ABNORMAL HIGH (ref 0.00–0.07)
Immature Granulocytes: 1.2 %
Instrument Absolute Neutrophil Count: 8.52 10*3/uL — ABNORMAL HIGH (ref 1.10–6.33)
Lymphocytes Absolute Automated: 3.28 10*3/uL — ABNORMAL HIGH (ref 0.42–3.22)
Lymphocytes Automated: 25.2 %
MCH: 26.2 pg (ref 25.1–33.5)
MCHC: 29.9 g/dL — ABNORMAL LOW (ref 31.5–35.8)
MCV: 87.6 fL (ref 78.0–96.0)
MPV: 10.4 fL (ref 8.9–12.5)
Monocytes Absolute Automated: 0.69 10*3/uL (ref 0.21–0.85)
Monocytes: 5.3 %
Neutrophils Absolute: 8.52 10*3/uL — ABNORMAL HIGH (ref 1.10–6.33)
Neutrophils: 65.4 %
Nucleated RBC: 0 /100 WBC (ref 0.0–0.0)
Platelets: 416 10*3/uL — ABNORMAL HIGH (ref 142–346)
RBC: 3.63 10*6/uL — ABNORMAL LOW (ref 3.90–5.10)
RDW: 14 % (ref 11–15)
WBC: 13.02 10*3/uL — ABNORMAL HIGH (ref 3.10–9.50)

## 2023-04-03 LAB — ECG 12-LEAD
Atrial Rate: 93 {beats}/min
IHS MUSE NARRATIVE AND IMPRESSION: NORMAL
P Axis: 56 degrees
P-R Interval: 150 ms
Q-T Interval: 372 ms
QRS Duration: 78 ms
QTC Calculation (Bezet): 462 ms
R Axis: 2 degrees
T Axis: 120 degrees
Ventricular Rate: 93 {beats}/min

## 2023-04-03 LAB — COMPREHENSIVE METABOLIC PANEL
ALT: 9 U/L (ref 0–55)
AST (SGOT): 8 U/L (ref 5–41)
Albumin/Globulin Ratio: 0.8 — ABNORMAL LOW (ref 0.9–2.2)
Albumin: 2.7 g/dL — ABNORMAL LOW (ref 3.5–5.0)
Alkaline Phosphatase: 109 U/L (ref 37–117)
Anion Gap: 8 (ref 5.0–15.0)
BUN: 41 mg/dL — ABNORMAL HIGH (ref 7.0–21.0)
Bilirubin, Total: 0.2 mg/dL (ref 0.2–1.2)
CO2: 18 mEq/L (ref 17–29)
Calcium: 8.3 mg/dL — ABNORMAL LOW (ref 8.5–10.5)
Chloride: 111 mEq/L (ref 99–111)
Creatinine: 3.4 mg/dL — ABNORMAL HIGH (ref 0.4–1.0)
Globulin: 3.6 g/dL (ref 2.0–3.6)
Glucose: 141 mg/dL — ABNORMAL HIGH (ref 70–100)
Potassium: 4.6 mEq/L (ref 3.5–5.3)
Protein, Total: 6.3 g/dL (ref 6.0–8.3)
Sodium: 137 mEq/L (ref 135–145)
eGFR: 16.4 mL/min/{1.73_m2} — AB (ref >=60)

## 2023-04-03 LAB — NT-PROBNP: NT-proBNP: 766 pg/mL — ABNORMAL HIGH (ref 0–125)

## 2023-04-03 LAB — BETA HCG QUANTITATIVE, PREGNANCY: hCG, Quant.: <2.4 m[IU]/mL

## 2023-04-03 LAB — HIGH SENSITIVITY TROPONIN-I WITH DELTA
hs Troponin-I Delta: -2.7 ng/L
hs Troponin-I: 15.8 ng/L — AB

## 2023-04-03 LAB — D-DIMER: D-Dimer: 0.62 ug/mL FEU — ABNORMAL HIGH (ref 0.00–0.60)

## 2023-04-03 LAB — HIGH SENSITIVITY TROPONIN-I: hs Troponin-I: 18.5 ng/L — AB

## 2023-04-03 MED ORDER — HYDRALAZINE HCL 20 MG/ML IJ SOLN
10.0000 mg | Freq: Once | INTRAMUSCULAR | Status: AC
Start: 2023-04-03 — End: 2023-04-03
  Administered 2023-04-03: 10 mg via INTRAVENOUS
  Filled 2023-04-03: qty 1

## 2023-04-03 MED ORDER — TECHNETIUM TC 99M ALBUMIN AGGREGATED
5.5000 | Freq: Once | Status: AC | PRN
Start: 2023-04-03 — End: 2023-04-03
  Administered 2023-04-03: 6 via INTRAVENOUS

## 2023-04-03 MED ORDER — BUMETANIDE 0.25 MG/ML IJ SOLN
1.0000 mg | Freq: Once | INTRAMUSCULAR | Status: AC
Start: 2023-04-03 — End: 2023-04-03
  Administered 2023-04-03: 1 mg via INTRAVENOUS
  Filled 2023-04-03: qty 4

## 2023-04-03 NOTE — ED Triage Notes (Signed)
[name] is a 42 y.o. female BIB self c/o of CP since 2 days ago. Pt states her CP radiates to her neck. Pt c/o the CP to be dull. Pt also c/o of SOB which worsens upon exertion. Pt also c/o of leg swelling, denies any recent travel. Pt also c/o of N/V, mild dizziness, but denies any abdominal pain, fever, chills. Pt has stage 4 kidney failure and states decrease in UOP. Pt states she also has dry cough.

## 2023-04-03 NOTE — ED Triage Notes (Signed)
[facility] EMERGENCY DEPARTMENT  Provider in Triage Note        Patient Name: Chanley Legault    Chief Complaint:   Chief Complaint   Patient presents with    Chest Pain    Shortness of Breath    Flank Pain       HPI: Zayleah Schuller is a 42 y.o. female, stage 4 CKD, CHF, hyperlipidemia, DM, HTN, who has had a rapid medical screening evaluation initiated by myself. Cc chest pain, sob for past 2 days worsening in past several days, chest pain radiates to neck, dull sensation, dyspnea on exertion. +bilateral flank pain, dysuria, +lower leg swelling.  Denies fevers, chills, admits to nausea, vomiting. Cardiologist and Nephrologist told her to go to ER for evaluation. Produces little urine.Took diuretic which helped some of the leg sweling. On exam no acute distress. Mild lower leg swelling, no edema. Normal rise and fall of chest, no acute resp distress, heart rrr    Medical/Surgical/Social history: as per HPI    Vitals: BP (!) 164/97   Pulse 94   Temp 98.6 F (37 C) (Oral)   Resp 20   Wt 145 kg   SpO2 98%   BMI 51.62 kg/m     Pertinent brief exam:   Examination of area of concern:     Preliminary orders: labs, EKG, cxr    Symptom based preliminary diagnosis/MDM: chest pain, sob, flank pain    Patient advised to remain in the ED until further evaluation can be performed. Patient instructed to notify staff of any changes in condition while waiting.  This assessment is an initial evaluation to expedite care.
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
## 2023-04-03 NOTE — ED Provider Notes (Signed)
EMERGENCY DEPARTMENT HISTORY AND PHYSICAL EXAM     None        Date: 04/03/2023  Patient Name: [name]    History of Presenting Illness     Chief Complaint   Patient presents with   . Chest Pain   . Shortness of Breath   . Flank Pain         Additional History: [name] is a 42 y.o. female presenting to the ED with chest pain, flank pain.  Patient reports that she has had episodes of chest pain since last night.  She reports an aching pain in the right side of her chest and midline that she feels in her back.  Is not radiating.  Is a aching pain.  Not sharp or tearing.  She notes that her blood pressure has been higher than normal for the last several days.  A few months ago she was taken off olmesartan due to worsening kidney function and she has only been on hydralazine 100 mg twice daily, she has been compliant with her medications.  No change in meds.    Additionally has been noting longstanding bilateral flank pain, dysuria and lower leg swelling.  No hematuria.  She sees cardiology and nephrology for her kidney function, notes that she produces very little urine and has been producing less urine than normal today.  She did take her diuretic Bumex which has helped with some of the leg swelling.  She has chronic swelling of her legs especially in her left lower extremity, no worse than normal.  Has a history of a prior PE and is on Eliquis long-term.  Patient reports no recent trauma, hospitalizations, immobilization, surgery, travel  '      All associated symptoms and pertinent negative seen in review of systems below.    PCP: Alyson Locket, DO  SPECIALISTS:    No current facility-administered medications for this encounter.     Current Outpatient Medications   Medication Sig Dispense Refill   . albuterol sulfate HFA (PROVENTIL) 108 (90 Base) MCG/ACT inhaler Inhale 2 puffs into the lungs every 4 (four) hours as needed for Wheezing 1 each 0   . apixaban (ELIQUIS) 5 MG Take 1 tablet (5  mg) by mouth every 12 (twelve) hours 60 tablet 0   . ARIPiprazole (ABILIFY) 5 MG tablet Take 2 tablets (10 mg) by mouth daily     . aspirin 81 MG chewable tablet Chew 1 tablet (81 mg) by mouth daily     . atorvastatin (LIPITOR) 80 MG tablet Take 1 tablet (80 mg) by mouth nightly 30 tablet 0   . bumetanide (BUMEX) 2 MG tablet Take 1 tablet (2 mg) by mouth 2 (two) times daily 60 tablet 0   . carvedilol (COREG) 25 MG tablet Take 1 tablet (25 mg) by mouth 2 (two) times daily with meals     . Dapagliflozin Propanediol (Farxiga) 10 MG Tab Take 1 tablet (10 mg) by mouth daily     . diclofenac Sodium (VOLTAREN) 1 % Gel topical gel Apply 2 g topically 4 (four) times daily 50 g 0   . EPINEPHrine 0.15 MG/0.3ML injection Inject 0.3 mLs (0.15 mg) into the muscle as needed     . hydrALAZINE (APRESOLINE) 100 MG tablet Take 1 tablet (100 mg) by mouth 2 (two) times daily 60 tablet 0   . insulin detemir (LEVEMIR) 100 UNIT/ML injection Inject 53 Units into the skin every 12 (twelve) hours     .  insulin lispro 100 UNIT/ML injection Inject 25 Units into the skin 3 (three) times daily before meals     . lidocaine (LIDODERM) 5 % Place 3 patches onto the skin every 24 hours Remove & Discard patch within 12 hours or as directed by MD     . montelukast (SINGULAIR) 10 MG tablet Take 1 tablet (10 mg) by mouth nightly     . omeprazole (PriLOSEC) 40 MG capsule Take 1 capsule (40 mg) by mouth daily     . pregabalin (LYRICA) 150 MG capsule Take 1 capsule (150 mg) by mouth 2 (two) times daily     . sertraline (ZOLOFT) 50 MG tablet Take 1 tablet (50 mg) by mouth daily     . traZODone (DESYREL) 50 MG tablet Take 1 tablet (50 mg) by mouth nightly     . budesonide-formoterol (SYMBICORT) 80-4.5 MCG/ACT inhaler Inhale 2 puffs into the lungs 2 (two) times daily 1 each 0   . DULoxetine (CYMBALTA) 20 MG capsule Take 1 capsule (20 mg) by mouth daily     . folic acid (FOLVITE) 1 MG tablet Take 1 tablet (1 mg) by mouth daily     . Insulin Syringe-Needle U-100  27G X 1/2" 1 ML Misc Inject 1 Dose into the skin 4 (four) times daily 500 each 3       Past History     I have reviewed Past Medical History, Surgical History, Family History and Social as documented below. Also reviewed current medication history per nursing notes.     Past Medical History:  Past Medical History:   Diagnosis Date   . Anemia    . Arthritis    . Asthma    . Chronic kidney disease     Stage III   . Congestive heart failure    . Deep venous thrombosis of distal lower extremity    . Diabetes mellitus    . Fibromyalgia    . Gastroesophageal reflux disease    . Hyperlipidemia    . Hypertension    . Neuropathy    . Pulmonary embolism 08/2019   . Renal insufficiency    . 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 Scoliosis    . Sleep apnea     no cpap   . Type 2 diabetes mellitus, controlled    . Vertigo        Past Surgical History:  Past Surgical History:   Procedure Laterality Date   . ABLATION ENDOMETRIAL     . APPENDECTOMY (OPEN)     . BIOPSY, RENAL N/A 05/22/2022    Procedure: Biopsy, Renal;  Surgeon: Verlee Rossetti, MD;  Location: [facility];  Service: Interventional Radiology;  Laterality: N/A;  Per Didem, ptt advised to hold Eliquis for 48 hours and hold aspirin 5 days prior to procedure. kr   . CESAREAN SECTION      x 2   . CHOLECYSTECTOMY     . EYE SURGERY Bilateral     cataract   . HYSTERECTOMY  12/2019   . RELEASE, CARPAL TUNNEL Left 07/10/2022    Procedure: RELEASE, CARPAL TUNNEL;  Surgeon: Cathrine Muster, MD;  Location: [facility];  Service: Plastics;  Laterality: Left;   . REPAIR, UPPER EXTREMITY, TENDON & NERVE Right 12/05/2021    Procedure: REPAIR, RIGHT ULNAR NERVE AND NEUROPLASTY @ ELBOW AND WRIST, RIGHT OPEN CARPAL TUNNEL RELEASE;  Surgeon: Cathrine Muster, MD;  Location: [facility];  Service: Plastics;  Laterality: Right;   . REPAIR, UPPER EXTREMITY, TENDON & NERVE Left 07/10/2022    Procedure: LEFT ULNAR NERVE NEUROPLASTY AT ELBOW AND WRIST, LEFT OPEN CARPAL TUNNEL RELEASE;   Surgeon: Cathrine Muster, MD;  Location: [facility];  Service: Plastics;  Laterality: Left;   . UTERINE RUPTURE         Family History:  Family History   Problem Relation Age of Onset   . Heart disease Mother    . Hypertension Mother    . Diabetes Mother    . Asthma Father    . Hypertension Father    . Diabetes Daughter    . Asthma Son    . Cancer Maternal Aunt    . Cancer Maternal Grandmother        Social History:  Social History     Tobacco Use   . Smoking status: Never   . Smokeless tobacco: Never   Vaping Use   . Vaping status: Never Used   Substance Use Topics   . Alcohol use: Never   . Drug use: Never       Allergies:  Allergies   Allergen Reactions   . Amlodipine Hives   . Bactrim [Sulfamethoxazole-Trimethoprim] Hives, Respiratory Distress and Edema   . Glipizide Edema     Throat swelling   . Lisinopril Edema     Tongue swelling   . Losartan Other (See Comments), Swelling and Edema       Review of Systems     Review of Systems   Constitutional:  Negative for chills and fever.   Gastrointestinal:  Negative for abdominal pain, nausea and vomiting.       Physical Exam   BP (!) 164/97   Pulse 94   Temp 98.6 F (37 C) (Oral)   Resp 20   Wt 145 kg   SpO2 98%   BMI 51.62 kg/m     Physical Exam    Constitutional: Alert, well appearing and in no distress. Sitting upright, seems mildly uncomfortable when supine with orthopnea  Head: Normocephalic and atraumatic.   Mouth/Throat: Oropharynx is clear and moist.   Eyes: Conjunctivae normal. Pupils are equal and round.   Cardiovascular: Normal rate, regular rhythm. 2+ radial and DP pulses  Pulmonary/Chest: Effort normal and breath sounds normal.   Abdominal: Non tender. Soft. Non distended. No guarding  Musculoskeletal: Bilateral LE edema worse on L than right  Neurological: Patient is alert and GCS score is 15.    Skin: Skin is warm and dry.  Psychiatric: Affect normal.     Diagnostic Study Results     Labs -     Results       Procedure Component Value  Units Date/Time    High Sensitivity Troponin-I at 2 hrs with calculated Delta [[id number]]  (Abnormal) Collected: 04/03/23 1651    Specimen: Blood Updated: 04/03/23 1724     hs Troponin-I 15.8 ng/L      hs Troponin-I Delta -2.7 ng/L     D-Dimer [[id number]]  (Abnormal) Collected: 04/03/23 1651     Updated: 04/03/23 1714     D-Dimer 0.62 ug/mL FEU     NT-proBNP [[id number]]  (Abnormal) Collected: 04/03/23 1351     Updated: 04/03/23 1445     NT-proBNP 766 pg/mL     Beta HCG Quantitative, Pregnancy [[id number]] Collected: 04/03/23 1351     Updated: 04/03/23 1443     hCG, Quant. <2.4 mIU/mL     Comprehensive metabolic panel [[id number]]  (  Abnormal) Collected: 04/03/23 1351    Specimen: Blood Updated: 04/03/23 1440     Glucose 141 mg/dL      BUN 16.1 mg/dL      Creatinine 3.4 mg/dL      Sodium 096 mEq/L      Potassium 4.6 mEq/L      Chloride 111 mEq/L      CO2 18 mEq/L      Calcium 8.3 mg/dL      Protein, Total 6.3 g/dL      Albumin 2.7 g/dL      AST (SGOT) 8 U/L      ALT 9 U/L      Alkaline Phosphatase 109 U/L      Bilirubin, Total 0.2 mg/dL      Globulin 3.6 g/dL      Albumin/Globulin Ratio 0.8     Anion Gap 8.0     eGFR 16.4 mL/min/1.73 m2     High Sensitivity Troponin-I at 0 hrs [[id number]]  (Abnormal) Collected: 04/03/23 1351    Specimen: Blood Updated: 04/03/23 1440     hs Troponin-I 18.5 ng/L     CBC and differential [[id number]]  (Abnormal) Collected: 04/03/23 1351    Specimen: Blood Updated: 04/03/23 1419     WBC 13.02 x10 3/uL      Hgb 9.5 g/dL      Hematocrit 21.3 %      Platelets 416 x10 3/uL      RBC 3.63 x10 6/uL      MCV 87.6 fL      MCH 26.2 pg      MCHC 29.9 g/dL      RDW 14 %      MPV 10.4 fL      Instrument Absolute Neutrophil Count 8.52 x10 3/uL      Neutrophils 65.4 %      Lymphocytes Automated 25.2 %      Monocytes 5.3 %      Eosinophils Automated 2.4 %      Basophils Automated 0.5 %      Immature Granulocytes 1.2 %      Nucleated RBC 0.0 /100 WBC      Neutrophils Absolute 8.52 x10 3/uL      Lymphocytes  Absolute Automated 3.28 x10 3/uL      Monocytes Absolute Automated 0.69 x10 3/uL      Eosinophils Absolute Automated 0.31 x10 3/uL      Basophils Absolute Automated 0.07 x10 3/uL      Immature Granulocytes Absolute 0.15 x10 3/uL      Absolute NRBC 0.00 x10 3/uL     Beta HCG Quantitative, Pregnancy [[id number]] Collected: 04/03/23 1351     Updated: 04/03/23 1351            Radiologic Studies -   Radiology Results (24 Hour)       Procedure Component Value Units Date/Time    US Venous Low Extrem Duplx Dopp Comp Bilat [[id number]] Collected: 04/03/23 1644    Order Status: Completed Updated: 04/03/23 1647    Narrative:      HISTORY: Bilateral leg edema    COMPARISON: Ultrasound 11/20/2022    TECHNIQUE: Wallace Cullens scale, color flow, and spectral Doppler waveform analysis  was performed on the bilateral lower extremity veins described below. There  is normal compressibility, phasic flow, and response to augmentation unless  otherwise noted.    FINDINGS:   Right lower extremity:    Common femoral vein: Normal  Greater saphenous vein at the saphenofemoral junction: Normal  Deep femoral vein (proximal portion): Normal  Femoral vein: Normal  Popliteal vein: Normal  Posterior tibial veins: Normal  Peroneal veins: Normal    Left lower extremity:    Common femoral vein: Normal  Greater saphenous vein at the saphenofemoral junction: Normal  Deep femoral vein (proximal portion): Normal  Femoral vein: Normal  Popliteal vein: Normal  Posterior tibial veins: Normal  Peroneal veins: Normal      Impression:         No sonographic evidence for right or left lower extremity deep venous  thrombosis.    Barnie Alderman, DO  04/03/2023 4:45 PM    CT Abd/Pelvis without Contrast [[id number]] Collected: 04/03/23 1527    Order Status: Completed Updated: 04/03/23 1534    Narrative:      HISTORY:  Bilateral flank pain.    COMPARISON: None available.    TECHNIQUE: CT of the abdomen and pelvis performed without intravenous  contrast. The following dose  reduction techniques were utilized: automated  exposure control and/or adjustment of the mA and/or KV according to patient  size, and the use of an iterative reconstruction technique.    CONTRAST: None.    FINDINGS: No urinary tract calculi are identified. No hydronephrosis. No  perinephric or periureteral edema. Sensitivity for some abnormalities  including renal masses is limited without IV contrast material. No renal  masses or cysts are visible. The bladder wall has normal thickness.    Cholecystectomy is noted. There are multiple calcifications along the  dorsal surface of the liver, probably dropped gallstones. No focal liver  lesions. No dilatation of the biliary tree. Unremarkable spleen, pancreas,  adrenals, kidneys, and abdominal aorta. Included portions of the lungs and  mediastinum are unremarkable.      Impression:        1. No urinary tract calculi are seen. No acute findings.  2. Cholecystectomy. Small calcifications along the dorsal surface of the  liver are probably dropped gallstones.    Wynema Birch, MD  04/03/2023 3:32 PM    Chest 2 Views [[id number]] Collected: 04/03/23 1443    Order Status: Completed Updated: 04/03/23 1447    Narrative:      Clinical History: Chest pain.    Comparison: 12/11/2022     Technique: PA and lateral views of the chest.     Findings: Stable mild cardiomegaly. Pulmonary vascularity is normal.     Stable mild elevation of the right hemidiaphragm. No focal consolidation or  pleural effusion.      Impression:       Stable mild cardiomegaly.    Darra Lis, MD  04/03/2023 2:45 PM        .    Medical Decision Making     I Dr. Artis Delay, am the primary provider of record for this patient.   I reviewed the vital signs, available nursing notes, past medical history, past surgical history, family history and social history.      MDM:      Anjellica Greenland is a 42 y.o. 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  presenting today for acute, moderate chest pain, flank pain, change in urine.  Physical exam is notable for lower leg swelling, seems little uncomfortable lying flat  Notably, further history obtained from:  Outside records reviewed as follows:  Pt's history of the following has impacted care and subsequent medical decision making:     Differential diagnosis includes CHF, ACS, PE, DVT.  Do not suspect dissection as cause for pain has equal pulses.  Intermediate risk Wells score will send dimer.  EKG nonischemic no evidence of STEMI.  No ST depressions and morphology is unchanged from previous EKG 12/11/2022.  She is pending second troponin.  Given her dependence on Bumex and her kidney function will give a dose of Bumex here today.  Reassess.    Patient was treated with the following medications in the ED:  Medications   bumetanide (BUMEX) injection 1 mg (1 mg Intravenous Given 04/03/23 1659)       See ED course for additional MDM and disposition    ED Course:  Vital Signs-Reviewed the patient's vital signs.   Patient Vitals for the past 12 hrs:   BP Temp Pulse Resp   04/03/23 1332 (!) 164/97 98.6 F (37 C) 94 20     Pulse Oximetry Analysis - SpO2: 98 % on RA    ED Course as of 04/03/23 2032   Fri Apr 03, 2023   1648 US Venous Low Extrem Duplx Dopp Comp Bilat     IMPRESSION:      No sonographic evidence for right or left lower extremity deep venous  thrombosis.   [BL]   1650 Creatinine(!): 3.4  Similar to prior 3 months ago [BL]   1651 Workup so far shows labs are similar to baseline including creatinine, leukocytosis and hemoglobin.  BNP slightly elevated.  Troponin mildly elevated to  18, pending repeat.  Pending dimer to rule out a PE given her history of the low risk as she is still on anticoagulation. [BL]   1727 hs Troponin-I Delta: -2.7  Unlikely acs [BL]   2031 NM Lung Scan Perfusion Particulate  IMPRESSION:    Normal perfusion.   [BL]      ED Course User Index  [BL] Norva Karvonen, MD       Tests and imaging independently interpreted as follows:   EKG: Independently interpreted by me, the Emergency Physician as follow: EKG nonischemic no evidence of STEMI.  No ST depressions and morphology is unchanged from previous EKG 12/11/2022.  Labs: All labs have been ordered, reviewed and interpreted by me as follows:  Xrays: Ordered, reviewed and interpreted by me, confirmed by radiology report as follows:          Wells Score      Flowsheet Row Value   Previous DVT or PE 1.5   HR greater than 100 0   Immobilization at least 3 days or surgery within the previous 4 weeks 0   Clinical Signs/Symptoms of DVT 0   Alternative diagnosis less likely than PE 0   Hemoptysis 0   Malignancy with treatment within 6 months or palliative care 0   Wells Score for PE 1.5          PERC Score      Flowsheet Row Value   Age (read only) less than or equal to 50 years   HR >= 100 0   O2 Sat on Room Air < 95% 0   Prior History of Venous Thromboembolism 1   Trauma or Surgery within 4 weeks 0   Hemoptysis 0   Exogenous Estrogen 0   Unilateral Leg Swelling 0   PERC Rule Score (Calculated) 1  Heart Score      Flowsheet Row Value   History 1   EKG 1   Risk Factors 1   Total (with age) 3   Onset of pain (time of START of last episode of chest pain)? >3 hrs ago            Procedures:    Critical Care Time:        Diagnosis     Clinical Impression: No diagnosis found.    Treatment Plan:   ED Disposition       None              _______________________________      Attestations: This note is prepared by Norva Karvonen, MD.    This note was generated by the [employer] EMR system/ Dragon speech recognition and may contain inherent errors  or omissions not intended by the user. Grammatical errors, random word insertions, deletions and pronoun errors  are occasional consequences of this technology due to software limitations. Not all errors are caught or corrected. If there are questions or concerns about the content of this note or information contained within the body of this dictation they should be addressed directly with the author for clarification.    _______________________________     Norva Karvonen, MD  04/03/23 [phone]

## 2023-04-14 NOTE — Consults (Signed)
[facility]    CONSULT NOTE  [phone]  Myrla Halsted  Spectralink: [id number]      Date Time: 04/14/2023 11:23 AM  Patient Name: [name]  Requesting Physician: Candee Furbish, MD  Consulting Physician: Conchita Paris. Lendell Caprice, MD, MD    Primary Care Physician: Marlowe Kays    Reason for Consultation: Bernerd Limbo on CKD, volume overload       Assessment:     Patient Active Problem List   Diagnosis   . Chronic heart failure with preserved ejection fraction (CMS/HCC)   . Primary hypertension   . Mixed hyperlipidemia   . Palpitations   . Other chest pain   . Aneurysm of ascending aorta without rupture (CMS/HCC)   . History of pulmonary embolism   . Shortness of breath   . Acute renal failure superimposed on stage 4 chronic kidney disease, unspecified acute renal failure type (CMS/HCC)   . Anemia in chronic kidney disease (CODE)   . Asthma   . Diabetic polyneuropathy associated with type 2 diabetes mellitus (CMS/HCC)   . Fibromyalgia   . Gastroesophageal reflux disease   . Hypertensive urgency   . Obstructive sleep apnea   . Stage 4 chronic kidney disease (CMS/HCC)   . Leukocytosis   . Hyperkalemia   . Metabolic acidosis with normal anion gap and bicarbonate losses   . Obesity, Class III, BMI 40-49.9 (morbid obesity) (CMS/HCC)       AKI on CKD - volume overload - needs diuresis  CKD stage IV - due to DN, HTN kidney disease - followed by Dr Lendell Caprice  - bl sCr 2.5-2.8  Volume overload - complicated by hypertensive emergency - being diureses  Metabolic acidosis - chronic - due to CKD  Hypocalcemia - due to CKD + secondary hyperPTH or renal origin  Secondary hyperPTH of renal originn - PTH 474 - will add calcitriol   Anemia - due to CKD and IDA   DM type 2 - hbA1c 8.7 04/14/23  Proteinuria - chronic - nephrotic range   - renal bx 05/22/22: DN with mod ot sev IFTA  H/o PE on AC  Class III obesity - seen at [facility] - in the process of being evaluated for sleeve gastrectomy     Recommendations:     Continue lasix gtt; change  to bumex gtt if no robust urine output, would also add metolazone   Daily weights   1L fluid restriction - she has been drinking a lot of water  Inability to urinate, feels bladder is full  - would place a foley catheter  May need initiation of dialysis during this admission - pt aware and agrees to it  Continue sodium bicarbonate   Was on farxiga as OP - hold for now   Start calcitriol 0.57mcg daily   Echocardiogram         Candee Furbish, MD, thank you for this consultation.  We will follow the patient with you during this hospitalization.  Please contact me with any questions or issues.    Signed by: Conchita Paris. Lendell Caprice, MD, MD  [facility]  [phone] (office)  [phone]      -----------------------------------------------------------------------------------------------------------        History of Presenting Illness:   [name] is a 42 y.o. female with CKD stage IV, DM type 2, Hypertension complicated by kidney disease, chronic proteinuria, PE on AC, anemia who presented to the hospital with increased shortness of breath, now  being diuresed for which nephrology has been consulted.     Patient last seen in the office in March. Had an appt in May but cancelled due to not feeling well. Reports 4-5 day history of water retention, shortness of breath and uncontrolled hypertension. Has been taking bumex 2mg  bid at home.     Past Medical History:     Past Medical History:   Diagnosis Date   . Asthma    . CKD (chronic kidney disease) stage 4, GFR 15-29 ml/min (CMS/HCC)    . Diabetes mellitus (CMS/HCC)    . Fibromyalgia    . Heart disease    . Hypertension    . Migraines    . Neuropathy    . Osteoarthritis    . Vertigo          Past Surgical History:     Past Surgical History:   Procedure Laterality Date   . CESAREAN SECTION         Family History:     Family History   Problem Relation Age of Onset   . Heart disease Mother    . Hypertension Mother    . Hypertension Father    . Hypertension  Mother's Sister    . Hypertension Mother's Brother    . Heart disease Maternal Grandmother    . Hypertension Maternal Grandmother    . Heart disease Maternal Grandfather    . Hypertension Maternal Grandfather    . Heart disease Paternal Grandmother    . Hypertension Paternal Grandmother    . Heart disease Paternal Grandfather    . Hypertension Paternal Grandfather        Social History:       Allergies:     Allergies   Allergen Reactions   . Amlodipine Swelling   . Glipizide Anaphylaxis, Hives, Swelling and Swelling/Edema     Other reaction(s): Edema  Throat swelling  Throat swelling     . Bactrim [Sulfamethoxazole-Trimethoprim]    . Lisinopril Swelling     Other reaction(s): Edema  Tongue swelling  Tongue swelling     . Losartan Swelling and Unknown       Medications:        Scheduled Meds: PRN Meds:    apixaban, 5 mg, oral, BID  ARIPiprazole, 5 mg, oral, Daily  aspirin, 81 mg, oral, Daily  atorvastatin, 80 mg, oral, Nightly  carvediloL, 25 mg, oral, BID  fluticasone propion-salmeteroL, 1 puff, inhalation, BID RT  fluticasone propionate, 2 spray, each nostril, Daily  hydrALAZINE, 100 mg, oral, q8h SCH  insulin lispro, 0-5 Units, subcutaneous, Before meals + HS  lidocaine, 2 patch, topical (top), Daily  NIFEdipine XL, 30 mg, oral, Daily  pantoprazole, 40 mg, oral, Daily  pregabalin, 25 mg, oral, Daily  sertraline, 100 mg, oral, Daily  sodium bicarbonate, 1,300 mg, oral, 4x daily  sodium zirconium cyclosilicate, 10 g, oral, Daily          Continuous Infusions:  furosemide (LASIX) 250 mg in sodium chloride 0.9 % 250 mL (1 mg/mL) infusion, 20 mg/hr, Last Rate: 20 mg/hr (04/14/23 1008)     acetaminophen, 650 mg, q6h PRN  albuterol HFA, 2 puff, q4h PRN  dextrose 50 % in water (D50W), 25 mL, PRN  docusate sodium, 100 mg, Daily PRN  glucagon, 1 mg, PRN  hydrALAZINE, 25 mg, q6h PRN  ondansetron ODT, 4 mg, q8h PRN   Or  ondansetron, 4 mg, q8h PRN  oxyCODONE-acetaminophen, 1 tablet, q4h PRN  oxyCODONE-acetaminophen, 2 tablet,  q4h PRN            Review of Systems:   A comprehensive review of systems was per the HPI and below:     General ROS: no f/c, no weight changes  HEENT ROS: no blurry vision  Hematological and Lymphatic ROS: no known bleeding  Respiratory ROS: shortness of breath  Cardiovascular ROS: negative for chest pain or dyspnea on exertion  Gastrointestinal ROS: negative for abd/flank pain, change in bowel habits  Genito-Urinary ROS: negative for dysuria, hematuria, difficulty voiding or nocturia  Musculoskeletal ROS:  negative for trauma or falls, arthralgias  Neurological ROS: no focal weakness      Physical Exam:     Vitals:    04/14/23 0500 04/14/23 0518 04/14/23 0819 04/14/23 1050   BP: 139/79 148/87 109/76 130/74   BP Location:  Right arm Right arm    Patient Position:  Lying Lying Lying   Pulse:  84 83 83   Resp:  18 18    Temp:  36.7 C (98.1 F) 36.3 C (97.3 F)    TempSrc:  Oral Axillary    SpO2: 98% 100% 98% 96%   Weight:       Height:           Intake and Output Summary (Last 24 hours) at Date Time    Intake/Output Summary (Last 24 hours) at 04/14/2023 1123  Last data filed at 04/14/2023 0527  Gross per 24 hour   Intake 1750 ml   Output 800 ml   Net 950 ml         General: awake, alert, oriented x 3, no acute distress.  HEENT: sclera anicteric  Neck: supple  Cardiovascular: regular rate and rhythm  Lungs: clear to auscultation   Abdomen: soft  Extremities: no edema  Neuro: A+O x 3      Labs:     Results from last 7 days   Lab Units 04/14/23  0449 04/13/23  1315   WBC AUTO 10*3/L   11.8* 13.8*   HEMOGLOBIN g/dL 8.7* 8.5*   HEMATOCRIT % 29.4* 28.7*   PLATELETS AUTO 10*3/uL 357 377     Results from last 7 days   Lab Units 04/14/23  0449 04/13/23  1953 04/13/23  1315   SODIUM mmol/L 135 137 136   POTASSIUM mmol/L 5.0 5.2* 5.5*   CHLORIDE mmol/L 105 109* 106   CO2 mmol/L 21* 17* 19*   BUN mg/dL 77* 77* 78*   PHOSPHORUS mg/dL 7.9*  --   --    MAGNESIUM mg/dL 1.6  --  1.6   EGFR ZO/XWR/6.04V*4 11* 12* 12*          US Renal,  Complete (kidneys and urinary bladder)   Final Result   IMPRESSION:      Marked distention of urinary bladder. Patient unable to void.      No hydronephrosis.      PQRS: [id number]      Electronically signed by Viann Fish, MD on 04/14/2023 10:44 AM      Xray Chest; 1 View - Portable   Final Result   Findings/impression: Heart appears enlarged but stable in size. No confluent infiltrate or pneumothorax.      Electronically signed by Roslyn Smiling, MD on 04/13/2023 3:55 PM      Complete Transthoracic Echo    (Results Pending)         cc: Candee Furbish, MD  Marlowe Kays

## 2023-04-15 NOTE — Progress Notes (Signed)
PROGRESS NOTE      Patient Name: [name]  Sex: female   Date Of Birth: [birth year]   Age: 42 y.o.   Date Of Admission: 04/13/2023   Primary Care Provider: Marlowe Kays    Reason for admission: SOB and anasarca      Hospital course:   This is a 42 yo female with PMH of obesity, AAA, DVT/PE on Eliquis, HFpEF, DM with peripheral neuropathy, CKD 4 and asthma presenting with shortness of breath/anasarca.   -6/4: On Lasix drip for diuresis, Cr 4.4-->4.9, renal following  -6/5: Lasix gtt switched to IV bumex 3 mg BID per renal     ASSESSMENT  Principal Problem:    Acute renal failure superimposed on stage 4 chronic kidney disease, unspecified acute renal failure type (CMS/HCC)  Active Problems:    Chronic heart failure with preserved ejection fraction (CMS/HCC)    Primary hypertension    Mixed hyperlipidemia    Aneurysm of ascending aorta without rupture (CMS/HCC)    Shortness of breath    Anemia in chronic kidney disease (CODE)    Diabetic polyneuropathy associated with type 2 diabetes mellitus (CMS/HCC)    Hypertensive urgency    Obstructive sleep apnea    Stage 4 chronic kidney disease (CMS/HCC)    Leukocytosis    Hyperkalemia    Metabolic acidosis with normal anion gap and bicarbonate losses    Obesity, Class III, BMI 40-49.9 (morbid obesity) (CMS/HCC)    Volume overload    Urinary retention    PLAN  Acute renal failure superimposed on stage 4 chronic kidney disease  Non gap met acidosis  Hyperkalemia, resolved  Shortness of breath   Volume overload   Chronic diastolic heart failure   -Renal US shows no hydronephrosis   -Lasix gtt switched to IV Bumex 3 mg BID. Transition to po Bumex home dose   -Strict I&O  -Fluid restriction 1L per day   -Sodium bicarb increased to 1300 mg QID  -Nephrology recommendations noted and appreciated     Urinary retention  -Continue Foley  -Follow up with urology in 7-10 days for void trial      HTN  -Continue Coreg, Hydralazine and Nifedipine      HLD  -Continue statin      History of  DVT/PE   Got VQ scan and BLE duplex at [facility] on 04/03/23 - results were unremarkable.   -Continue Eliquis     Type 2 diabetes   -Holding farxiga d/t renal fx   -SSI w/ hypoglycemia protocol      Asthma, stable   -Continue singular and home inhalers     Peripheral neuropathy  -Home lyrica renally dosed     Chronic low back pain  Fibromyalgia  -Lyrica as above  -Lidocaine patch and PRN low dose percocet  -PT/OT     Class III obesity   -Pt on Wegovy as outpt     GERD  -Home ppi     Anxiety and depression   -Home abilify      Leukocytosis, appears chronic  -no signs of infection at this time. Trending down      OSA  -Not on CPAP d/t insurance approval pending         #Code status: full   #Disposition: discharge tomorrow      SUBJECTIVE     Review of systems  Constitutional:  negative for fever, chills.   Skin: negative for rash  HENT: negative for congestion and headaches. negative for  blurred vision  Cardiovascular: negative for chest pain, orthopnea, palpitations  Respiratory: negative for cough, shortness of breath or wheezing  Gastrointestinal: negative for abdominal pain, diarrhea, nausea and vomiting  Genitourinary: negative for dysuria and hematuria   Musculoskeletal: + chronic back pain. negative for joint pain  Neurological: negative for dizziness, seizures and tremor    OBJECTIVE    Vitals:   BP 138/67 (BP Location: Right arm, Patient Position: Sitting)   Pulse 87   Temp 36.3 C (97.3 F) (Oral)   Resp 18   Ht 1.676 m (5' 5.98")   Wt 130 kg (286 lb 1.6 oz)   SpO2 96%   BMI 46.20 kg/m     Intake and Output:    Intake/Output Summary (Last 24 hours) at 04/15/2023 0915  Last data filed at 04/15/2023 1610  Gross per 24 hour   Intake 1250 ml   Output 4300 ml   Net -3050 ml     Physical exam:   Constitutional: appears comfortable, no distress  Head: normocephalic and atraumatic with no facial droop   Nose: nose normal.   Eyes: pupils are equal, round, and reactive to light, sclera anicteric  Neck: normal range of  motion, neck supple with no JVD present.   Cardiovascular: normal rate and regular rhythm, no murmur heard.  Pulmonary/Chest: no respiratory distress, crackles on auscultation   Gastrointestinal: soft, non distended, non tender and present bowel sounds.   Musculoskeletal: no deformity and moves all extremities   Extremities: no edema, no cyanosis, pedal pulses present     Neurological: alert and oriented to person, place, and time with no cranial nerve deficit.   Skin: not diaphoretic, skin is warm and dry, no rash noted    Diagnostic Results:   Reviewed personally  Lab Results   Component Value Date    WBC 10.8 (H) 04/15/2023    HGB 7.7 (L) 04/15/2023    HCT 25.9 (L) 04/15/2023    MCV 87.5 04/15/2023    PLT 312 04/15/2023     Lab Results   Component Value Date    GLUCOSE 152 (H) 04/15/2023    CALCIUM 7.6 (L) 04/15/2023    NA 134 (L) 04/15/2023    K 4.9 04/15/2023    CO2 21 (L) 04/15/2023    CL 105 04/15/2023    BUN 85 (H) 04/15/2023    CREATININE 5.19 (H) 04/15/2023     Complete Transthoracic Echo   Final Result      US Renal, Complete (kidneys and urinary bladder)   Final Result   IMPRESSION:      Marked distention of urinary bladder. Patient unable to void.      No hydronephrosis.      PQRS: [id number]      Electronically signed by Viann Fish, MD on 04/14/2023 10:44 AM      Xray Chest; 1 View - Portable   Final Result   Findings/impression: Heart appears enlarged but stable in size. No confluent infiltrate or pneumothorax.      Electronically signed by Roslyn Smiling, MD on 04/13/2023 3:55 PM        Current Medications:    Current Facility-Administered Medications:   .  acetaminophen (TYLENOL) tablet 650 mg, 650 mg, oral, q6h PRN, Micki Riley Park, MD  .  albuterol HFA (PROVENTIL HFA;VENTOLIN HFA) 90 mcg/actuation inhaler 2 puff, 2 puff, inhalation, q4h PRN, Micki Riley Park, MD  .  apixaban (ELIQUIS) tablet 5 mg, 5 mg, oral, BID, Charlett Lango, MD,  5 mg at 04/15/23 0842  .  ARIPiprazole (ABILIFY) tablet 5 mg, 5 mg, oral,  Daily, Micki Riley Park, MD, 5 mg at 04/15/23 0841  .  aspirin chewable tablet 81 mg, 81 mg, oral, Daily, Charlett Lango, MD, 81 mg at 04/15/23 0842  .  atorvastatin (LIPITOR) tablet 80 mg, 80 mg, oral, Nightly, Hyun C Park, MD, 80 mg at 04/14/23 2131  .  bumetanide (BUMEX) injection 3 mg, 3 mg, intravenous, BID, Donia Guiles, PA, 3 mg at 04/15/23 1315  .  calcitrioL (ROCALTROL) capsule 0.25 mcg, 0.25 mcg, oral, Daily, Delice Bison, MD, 0.25 mcg at 04/15/23 0841  .  carvediloL (COREG) tablet 25 mg, 25 mg, oral, BID, Micki Riley Park, MD, 25 mg at 04/15/23 0841  .  dextrose 50 % in water (D50W) injection 25 mL, 25 mL, intravenous, PRN, Micki Riley Park, MD  .  docusate sodium (COLACE) capsule 100 mg, 100 mg, oral, Daily PRN, Micki Riley Park, MD  .  fluticasone propion-salmeteroL (AIRDUO RESPICLICK) 113-14 mcg/actuation respiclick inhaler 1 puff, 1 puff, inhalation, BID RT, Charlett Lango, MD, 1 puff at 04/15/23 0829  .  fluticasone propionate (FLONASE) nasal spray 2 spray, 2 spray, each nostril, Daily, Charlett Lango, MD, 2 spray at 04/15/23 [phone]  .  glucagon injection 1 mg, 1 mg, intramuscular, PRN, Micki Riley Park, MD  .  hydrALAZINE (APRESOLINE) tablet 100 mg, 100 mg, oral, q8h SCH, Micki Riley Park, MD, 100 mg at 04/15/23 1315  .  hydrALAZINE (APRESOLINE) tablet 25 mg, 25 mg, oral, q6h PRN, Charlett Lango, MD, 25 mg at 04/13/23 1743  .  insulin lispro (HumaLOG) injection 0-5 Units, 0-5 Units, subcutaneous, Before meals + HS, Charlett Lango, MD, 1 Units at 04/15/23 [phone]  .  lidocaine (LIDODERM) 5 % patch 2 patch, 2 patch, topical (top), Daily, Charlett Lango, MD, 2 patch at 04/15/23 [phone]  .  NIFEdipine XL (PROCARDIA XL) 24 hr tablet 30 mg, 30 mg, oral, Daily, Micki Riley Park, MD, 30 mg at 04/15/23 0842  .  ondansetron ODT (ZOFRAN-ODT) dispersible tablet 4 mg, 4 mg, oral, q8h PRN **OR** ondansetron (ZOFRAN) injection 4 mg, 4 mg, intravenous, q8h PRN, Micki Riley Park, MD  .  oxyCODONE-acetaminophen (PERCOCET) 5-325 mg per tablet 1 tablet, 1 tablet, oral, q4h  PRN, Donia Guiles, PA, 1 tablet at 04/15/23 1334  .  pantoprazole (PROTONIX) EC tablet 40 mg, 40 mg, oral, Daily, Micki Riley Park, MD, 40 mg at 04/15/23 0521  .  pregabalin (LYRICA) capsule 25 mg, 25 mg, oral, Daily, Micki Riley Park, MD, 25 mg at 04/15/23 0842  .  sertraline (ZOLOFT) tablet 100 mg, 100 mg, oral, Daily, Micki Riley Park, MD, 100 mg at 04/15/23 0841  .  sodium bicarbonate tablet 1,300 mg, 1,300 mg, oral, 4x daily, Hyun C Park, MD, 1,300 mg at 04/15/23 1200  .  sodium zirconium cyclosilicate (LOKELMA) packet 10 g, 10 g, oral, Daily, Ovid Curd, MD, 10 g at 04/15/23 0842    Total time spent on the day of the encounter by the provider in the care of this patient and coordination of care is 50 minutes    Case discussed with patient, RN, Candee Furbish, MD (Medicine Attending MD) and Dr. Lendell Caprice (Nephrology)     Donia Guiles, PA-C   [facility]

## 2023-04-15 NOTE — Nursing Note (Signed)
Indwelling urinary catheter removed per protocol. Catheter is intact, peri-are assessment done, skin is intact.

## 2023-04-15 NOTE — Consults (Signed)
IV consult for peripheral access    22 gauge USGPIV placed left AC, brisk blood return saline locked.

## 2023-04-15 NOTE — Nursing Note (Signed)
A 16 Fr Foley catheter inserted per provider order. Procedure explained to patient. Patient denies allergies to iodine. Pt was well tolerated during the whole procedure. Catheter tube secured on the right upper thigh with stat lock. Drainage bag attached is bellow bladder level.

## 2023-04-16 NOTE — Unmapped External Note (Signed)
Goals:            Problem: Pain - Adult  Goal: Verbalizes/displays adequate comfort level or baseline comfort level  Outcome: Progressing  Flowsheets (Taken 04/16/2023 1159)  Comfort Management:  . Encourage patient to monitor pain and request assistance  . Assess pain using appropriate pain scale  . Administer analgesics based on type and severity of pain and evaluate response  . Implement non-pharmacological measures as appropriate and evaluate response  . Consider cultural and social influences on pain and pain management  . Notify provider if interventions are unsuccessful or patient reports new pain     Problem: Risk for Venous Thrombosis Embolism  Goal: Absence of Venous Thrombosis Embolism  Outcome: Progressing  Flowsheets (Taken 04/16/2023 1159)  Absence of Venous Thrombosis Embolism:  . Encourage mobility  . Assess for VTE signs and symptoms  . Ensure proper placement of compression device  . Educate on VTE prevention (mechanical and pharmacological)  . Perform skin care  . Ensure adequate peripheral circulation     Problem: Risk for Nosocomial Infection  Goal: Patient will remain free of nosocomial Infections  Outcome: Progressing  Flowsheets (Taken 04/16/2023 1159)  Nosocomial Infection Prevention Interventions:  . Instruct and encourage patient and visitors in proper handwashing  . Educate patient and visitors on infection prevention and isolation precautions  . Assess secretions for signs of infection  . Monitor labs for infection indications  . Assess wounds, incisions, insertion sites for signs of infection  . Assess for S/S of infection     Problem: Safety Adult - Fall  Goal: Free from fall injury  Outcome: Progressing  Flowsheets (Taken 04/16/2023 1159)  Free from fall injury:  . Assess patient frequently for physical needs  . Develop rounding plan  . Utilize bed or body alarms if needed  . Instruct patient to call for assistance with activity  . Institute fall precautions as indicated by assessment  .  Identify cognitive and physical deficits and behaviors that affect risk of falls  . Educate patient/family on ambulation safety, including physical limitations  . Modify environment to reduce risk of injury  . Develop toileting plan  . Consider OT/PT consult  . Consider constant observer if needed  . Consider room near nurses station     Problem: Discharge Planning  Goal: Discharge to home or other facility with appropriate resources  Outcome: Progressing  Flowsheets (Taken 04/16/2023 1159)  Addressed this shift: Discharge to home or other facility with appropriate resources:  . Identify barriers to discharge with patient and caregiver  . Identify discharge learning needs (meds, wound care, etc)  . Arrange for interpreters to assist at discharge as needed  . Arrange for needed discharge resources and transportation as appropriate  . Provide discharge education information to patient and family/caregiver  . Coordinate with case managers for resource assistance or information

## 2023-04-16 NOTE — Telephone Encounter (Addendum)
Pt is currently in the hospital at [facility]. She was unable to make the appt for her echo today, but one was completed on 04/14/23. Pt wanted to advise Dr. Chestine Spore. Pt also needs to reschedule appmt on 04/21/23 due to having a urology appt in [place] the same day. The earliest available appmt is 07/2023 & pt needs to be seen much sooner. Please return pt call.

## 2023-04-16 NOTE — Nursing Note (Signed)
Patient discharged this evening to the home with foley catheter.Discharge teaching done and medication explained to her,Patient verbalized understanding.IV line removed prior leaving to the floor.Patient left the floor with all her belongings.

## 2023-04-16 NOTE — Progress Notes (Signed)
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   Anemia - due to CKD and IDA   DM type 2 - hbA1c 8.7 04/14/23  Proteinuria - chronic - nephrotic range   - renal bx 05/22/22: DN with mod ot sev IFTA  H/o PE on AC  Class III obesity - seen at [facility] - in the process of being evaluated for sleeve gastrectomy     Recommendations:     Stable for discharge from renal perspective  Keep foley in - will need urology follow up next week  Bumex 3mg  PO bid  I gave her a lab slip to do labs on Monday or Tuesday next week  She has a follow up appt with me on 6/12      Case discussed with: patient, Donia Guiles PA    Elixabeth L. Lendell Caprice, MD  [facility]  [phone] (office)  [phone]   Spectralink: [id number]    Subjective: doing well, foley back in place since yesterday afternoon, good urine output    Review of Systems:     No chest pain, no edema  No SOB     Physical Exam:     Vitals:    04/16/23 0027 04/16/23 0427 04/16/23 0505 04/16/23 0844   BP: (!) 98/49  127/65 106/63   BP Location:   Right arm Right arm   Patient Position:   Lying Lying   Pulse:  87 87 88   Resp:  17 16 18    Temp:  36.4 C (97.5 F)  36.8 C (98.2 F)   TempSrc:  Oral  Oral   SpO2:  91% 94% 95%   Weight:       Height:           Intake and Output Summary (Last 24 hours) at Date Time    Intake/Output Summary (Last 24 hours) at 04/16/2023 1024  Last data filed at 04/16/2023 0400  Gross per  24 hour   Intake 450 ml   Output 1500 ml   Net -1050 ml       General: awake, alert, oriented x 3, no acute distress.  Cardiovascular: regular rate and rhythm  Lungs: clear to auscultation bilaterally  Abdomen: soft, increased adiposity  GU: foley in place, good urine output   Extremities: no edema      Labs:       Results from last 7 days   Lab Units 04/16/23  0433 04/15/23  0505 04/14/23  0449   WBC AUTO 10*3/L   10.9* 10.8* 11.8*   HEMOGLOBIN g/dL 7.7* 7.7* 8.7*   HEMATOCRIT % 24.7* 25.9* 29.4*   PLATELETS AUTO 10*3/uL 327 312 357       Results from last 7 days   Lab Units  04/16/23  0433 04/15/23  0505 04/14/23  0449 04/13/23  1953 04/13/23  1315   SODIUM mmol/L 135 134* 135   < > 136   POTASSIUM mmol/L 4.7 4.9 5.0   < > 5.5*   CHLORIDE mmol/L 103 105 105   < > 106   CO2 mmol/L 24 21* 21*   < > 19*   BUN mg/dL 90* 85* 77*   < > 78*   CREATININE mg/dL 9.52* 8.41* 3.24*   < > 4.51*   PHOSPHORUS mg/dL  --   --  7.9*  --   --    MAGNESIUM mg/dL  --   --  1.6  --  1.6   EGFR mL/min/1.18m*2 10* 10* 11*   < > 12*    < > = values in this interval not displayed.       Complete Transthoracic Echo   Final Result      US Renal, Complete (kidneys and urinary bladder)   Final Result   IMPRESSION:      Marked distention of urinary bladder. Patient unable to void.      No hydronephrosis.      PQRS: M0102      Electronically signed by Viann Fish, MD on 04/14/2023 10:44 AM      Xray Chest; 1 View - Portable   Final Result   Findings/impression: Heart appears enlarged but stable in size. No confluent infiltrate or pneumothorax.      Electronically signed by Roslyn Smiling, MD on 04/13/2023 3:55 PM                Signed by: Conchita Paris. Lendell Caprice, MD

## 2023-04-16 NOTE — Consults (Addendum)
Orders acknowledged. CM sent [facility] referrals on aidin and will follow up with pt regarding DME/ HH choice.    ADD: HH discussed with pt and DMEs. [facility] reserved in aidin and included on AVS.    Pt opted for [facility] instead of the RW. CM informed of potential processing/delivery time and pt agreeable to discharging home today with RW or any DME in hand. Will have [facility] delivered to the home. Ordered via parachute with [employer].    Pt stated daughter will transport home and assist with walking up ext/int stairs (about 7-8 in both).      Vesta Mixer, MSW  Social Work Case Manager  [phone]

## 2023-04-25 ENCOUNTER — Emergency Department
Admission: EM | Admit: 2023-04-25 | Discharge: 2023-04-26 | Disposition: A | Discharge status: Home / Self Care | Payer: No Typology Code available for payment source | Attending: Emergency Medicine | Admitting: Emergency Medicine

## 2023-04-25 DIAGNOSIS — E1122 Type 2 diabetes mellitus with diabetic chronic kidney disease: Secondary | ICD-10-CM | POA: Insufficient documentation

## 2023-04-25 DIAGNOSIS — I130 Hypertensive heart and chronic kidney disease with heart failure and stage 1 through stage 4 chronic kidney disease, or unspecified chronic kidney disease: Secondary | ICD-10-CM | POA: Insufficient documentation

## 2023-04-25 DIAGNOSIS — E875 Hyperkalemia: Secondary | ICD-10-CM | POA: Insufficient documentation

## 2023-04-25 DIAGNOSIS — I509 Heart failure, unspecified: Secondary | ICD-10-CM | POA: Insufficient documentation

## 2023-04-25 DIAGNOSIS — N189 Chronic kidney disease, unspecified: Secondary | ICD-10-CM

## 2023-04-25 DIAGNOSIS — N186 End stage renal disease: Secondary | ICD-10-CM | POA: Insufficient documentation

## 2023-04-25 DIAGNOSIS — I10 Essential (primary) hypertension: Secondary | ICD-10-CM

## 2023-04-25 DIAGNOSIS — R079 Chest pain, unspecified: Secondary | ICD-10-CM

## 2023-04-25 DIAGNOSIS — Z794 Long term (current) use of insulin: Secondary | ICD-10-CM | POA: Insufficient documentation

## 2023-04-25 LAB — LAB USE ONLY - URINALYSIS WITH MICROSCOPIC EXAM
Urine Bilirubin: NEGATIVE
Urine Ketones: NEGATIVE mg/dL
Urine Leukocyte Esterase: NEGATIVE
Urine Nitrite: NEGATIVE
Urine Specific Gravity: 1.013 (ref 1.001–1.035)
Urine Urobilinogen: NORMAL mg/dL (ref 0.2–2.0)
Urine pH: 7 (ref 5.0–8.0)

## 2023-04-25 LAB — LAB USE ONLY - CBC WITH DIFFERENTIAL
Absolute Basophils: 0.07 10*3/uL (ref 0.00–0.08)
Absolute Eosinophils: 0.36 10*3/uL (ref 0.00–0.44)
Absolute Immature Granulocytes: 0.15 10*3/uL — ABNORMAL HIGH (ref 0.00–0.07)
Absolute Lymphocytes: 3.23 10*3/uL — ABNORMAL HIGH (ref 0.42–3.22)
Absolute Monocytes: 0.98 10*3/uL — ABNORMAL HIGH (ref 0.21–0.85)
Absolute Neutrophils: 11.29 10*3/uL — ABNORMAL HIGH (ref 1.10–6.33)
Absolute nRBC: 0 10*3/uL (ref <=0.00)
Basophils %: 0.4 %
Eosinophils %: 2.2 %
Hematocrit: 27.8 % — ABNORMAL LOW (ref 34.7–43.7)
Hemoglobin: 8.4 g/dL — ABNORMAL LOW (ref 11.4–14.8)
Immature Granulocytes %: 0.9 %
Lymphocytes %: 20.1 %
MCH: 26.9 pg (ref 25.1–33.5)
MCHC: 30.2 g/dL — ABNORMAL LOW (ref 31.5–35.8)
MCV: 89.1 fL (ref 78.0–96.0)
MPV: 11.4 fL (ref 8.9–12.5)
Monocytes %: 6.1 %
Neutrophils %: 70.3 %
Platelet Count: 378 10*3/uL — ABNORMAL HIGH (ref 142–346)
Preliminary Absolute Neutrophil Count: 11.29 10*3/uL — ABNORMAL HIGH (ref 1.10–6.33)
RBC: 3.12 10*6/uL — ABNORMAL LOW (ref 3.90–5.10)
RDW: 14 % (ref 11–15)
WBC: 16.08 10*3/uL — ABNORMAL HIGH (ref 3.10–9.50)
nRBC %: 0 /100 WBC (ref <=0.0)

## 2023-04-25 LAB — COMPREHENSIVE METABOLIC PANEL
ALT: 16 U/L (ref 0–55)
AST (SGOT): 17 U/L (ref 5–41)
Albumin/Globulin Ratio: 0.6 — ABNORMAL LOW (ref 0.9–2.2)
Albumin: 2.6 g/dL — ABNORMAL LOW (ref 3.5–5.0)
Alkaline Phosphatase: 104 U/L (ref 37–117)
Anion Gap: 11 (ref 5.0–15.0)
BUN: 67 mg/dL — ABNORMAL HIGH (ref 7–21)
Bilirubin, Total: 0.4 mg/dL (ref 0.2–1.2)
CO2: 21 mEq/L (ref 17–29)
Calcium: 7.6 mg/dL — ABNORMAL LOW (ref 8.5–10.5)
Chloride: 106 mEq/L (ref 99–111)
Creatinine: 3.9 mg/dL — ABNORMAL HIGH (ref 0.4–1.0)
GFR: 13.8 mL/min/{1.73_m2} — ABNORMAL LOW (ref >=60.0)
Globulin: 4.4 g/dL — ABNORMAL HIGH (ref 2.0–3.6)
Glucose: 169 mg/dL — ABNORMAL HIGH (ref 70–100)
Potassium: 5.5 mEq/L — ABNORMAL HIGH (ref 3.5–5.3)
Protein, Total: 7 g/dL (ref 6.0–8.3)
Sodium: 138 mEq/L (ref 135–145)

## 2023-04-25 LAB — COVID-19 (SARS-COV-2) & INFLUENZA  A/B, NAA (ROCHE LIAT)
Influenza A RNA: NOT DETECTED
Influenza B RNA: NOT DETECTED
SARS-CoV-2 (COVID-19) RNA: NOT DETECTED

## 2023-04-25 LAB — NT-PROBNP: NT-ProBNP: 256 pg/mL — ABNORMAL HIGH (ref <125)

## 2023-04-25 LAB — LAB USE ONLY - URINE GRAY CULTURE HOLD TUBE

## 2023-04-25 MED ORDER — MORPHINE SULFATE 4 MG/ML IJ/IV SOLN (WRAP)
4.0000 mg | Freq: Once | Status: AC
Start: 2023-04-25 — End: 2023-04-25
  Administered 2023-04-25: 4 mg via INTRAVENOUS
  Filled 2023-04-25: qty 1

## 2023-04-25 MED ORDER — ONDANSETRON HCL 4 MG/2ML IJ SOLN
4.0000 mg | Freq: Once | INTRAMUSCULAR | Status: AC
Start: 2023-04-25 — End: 2023-04-25
  Administered 2023-04-25: 4 mg via INTRAVENOUS
  Filled 2023-04-25: qty 2

## 2023-04-25 NOTE — ED Provider Notes (Signed)
EMERGENCY DEPARTMENT HISTORY AND PHYSICAL EXAM     None        Date: 04/25/2023  Patient Name: [name]    History of Presenting Illness and Plan     Chief Complaint   Patient presents with   . Shortness of Breath   . Kidney Problem       History Provided By: Patient    HPI:     HPI documented within ED course below.       Associated symptoms and pertinent negative listed in ROS.    PCP: Alyson Locket, DO  SPECIALISTS:    No current facility-administered medications for this encounter.     Current Outpatient Medications   Medication Sig Dispense Refill   . albuterol sulfate HFA (PROVENTIL) 108 (90 Base) MCG/ACT inhaler Inhale 2 puffs into the lungs every 4 (four) hours as needed for Wheezing 1 each 0   . apixaban (ELIQUIS) 5 MG Take 1 tablet (5 mg) by mouth every 12 (twelve) hours 60 tablet 0   . ARIPiprazole (ABILIFY) 5 MG tablet Take 2 tablets (10 mg) by mouth daily     . aspirin 81 MG chewable tablet Chew 1 tablet (81 mg) by mouth daily     . atorvastatin (LIPITOR) 80 MG tablet Take 1 tablet (80 mg) by mouth nightly 30 tablet 0   . budesonide-formoterol (SYMBICORT) 80-4.5 MCG/ACT inhaler Inhale 2 puffs into the lungs 2 (two) times daily 1 each 0   . bumetanide (BUMEX) 2 MG tablet Take 1 tablet (2 mg) by mouth 2 (two) times daily 60 tablet 0   . carvedilol (COREG) 25 MG tablet Take 1 tablet (25 mg) by mouth 2 (two) times daily with meals     . Dapagliflozin Propanediol (Farxiga) 10 MG Tab Take 1 tablet (10 mg) by mouth daily     . diclofenac Sodium (VOLTAREN) 1 % Gel topical gel Apply 2 g topically 4 (four) times daily 50 g 0   . DULoxetine (CYMBALTA) 20 MG capsule Take 1 capsule (20 mg) by mouth daily     . EPINEPHrine 0.15 MG/0.3ML injection Inject 0.3 mLs (0.15 mg) into the muscle as needed     . folic acid (FOLVITE) 1 MG tablet Take 1 tablet (1 mg) by mouth daily     . hydrALAZINE (APRESOLINE) 100 MG tablet Take 1 tablet (100 mg) by mouth 2 (two) times daily 60 tablet 0   . insulin detemir  (LEVEMIR) 100 UNIT/ML injection Inject 53 Units into the skin every 12 (twelve) hours     . insulin lispro 100 UNIT/ML injection Inject 25 Units into the skin 3 (three) times daily before meals     . Insulin Syringe-Needle U-100 27G X 1/2" 1 ML Misc Inject 1 Dose into the skin 4 (four) times daily 500 each 3   . lidocaine (LIDODERM) 5 % Place 3 patches onto the skin every 24 hours Remove & Discard patch within 12 hours or as directed by MD     . montelukast (SINGULAIR) 10 MG tablet Take 1 tablet (10 mg) by mouth nightly     . omeprazole (PriLOSEC) 40 MG capsule Take 1 capsule (40 mg) by mouth daily     . pregabalin (LYRICA) 150 MG capsule Take 1 capsule (150 mg) by mouth 2 (two) times daily     . sertraline (ZOLOFT) 50 MG tablet Take 1 tablet (50 mg) by mouth daily     . traZODone (DESYREL) 50 MG tablet  Take 1 tablet (50 mg) by mouth nightly         Past History     Past Medical History:  Past Medical History:   Diagnosis Date   . Anemia    . Arthritis    . Asthma    . Chronic kidney disease     Stage III   . Congestive heart failure    . Deep venous thrombosis of distal lower extremity    . Diabetes mellitus    . Fibromyalgia    . Gastroesophageal reflux disease    . Hyperlipidemia    . Hypertension    . Neuropathy    . Pulmonary embolism 08/2019   . Renal insufficiency    . 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 Scoliosis    . Sleep apnea     no cpap   . Type 2 diabetes mellitus, controlled    . Vertigo        Past Surgical History:  Past Surgical History:   Procedure Laterality Date   . ABLATION ENDOMETRIAL     . APPENDECTOMY (OPEN)     . BIOPSY, RENAL N/A 05/22/2022    Procedure: Biopsy, Renal;  Surgeon: Verlee Rossetti, MD;  Location: [facility];  Service: Interventional Radiology;  Laterality: N/A;  Per Didem, ptt advised to hold Eliquis for 48 hours and hold aspirin 5 days prior to procedure. kr   . CESAREAN SECTION      x 2   . CHOLECYSTECTOMY     . EYE SURGERY Bilateral     cataract   . HYSTERECTOMY  12/2019   .  RELEASE, CARPAL TUNNEL Left 07/10/2022    Procedure: RELEASE, CARPAL TUNNEL;  Surgeon: Cathrine Muster, MD;  Location: [facility];  Service: Plastics;  Laterality: Left;   . REPAIR, UPPER EXTREMITY, TENDON & NERVE Right 12/05/2021    Procedure: REPAIR, RIGHT ULNAR NERVE AND NEUROPLASTY @ ELBOW AND WRIST, RIGHT OPEN CARPAL TUNNEL RELEASE;  Surgeon: Cathrine Muster, MD;  Location: [facility];  Service: Plastics;  Laterality: Right;   . REPAIR, UPPER EXTREMITY, TENDON & NERVE Left 07/10/2022    Procedure: LEFT ULNAR NERVE NEUROPLASTY AT ELBOW AND WRIST, LEFT OPEN CARPAL TUNNEL RELEASE;  Surgeon: Cathrine Muster, MD;  Location: [facility];  Service: Plastics;  Laterality: Left;   . UTERINE RUPTURE         Family History:  Family History   Problem Relation Age of Onset   . Heart disease Mother    . Hypertension Mother    . Diabetes Mother    . Asthma Father    . Hypertension Father    . Diabetes Daughter    . Asthma Son    . Cancer Maternal Aunt    . Cancer Maternal Grandmother        Social History:  Social History     Tobacco Use   . Smoking status: Never   . Smokeless tobacco: Never   Vaping Use   . Vaping status: Never Used   Substance Use Topics   . Alcohol use: Never   . Drug use: Never       Allergies:  Allergies   Allergen Reactions   . Amlodipine Hives   . Bactrim [Sulfamethoxazole-Trimethoprim] Hives, Respiratory Distress and Edema   . Glipizide Edema     Throat swelling   . Lisinopril Edema     Tongue swelling   . Losartan  Other (See Comments), Swelling and Edema       Review of Systems     Review of Systems    Physical Exam   BP 168/79   Pulse 88   Temp 98.9 F (37.2 C) (Oral)   Resp 17   Ht 5\' 6"  (1.676 m)   Wt 131.5 kg   SpO2 99%   BMI 46.77 kg/m     Physical Exam    Diagnostic Study Results     Labs -     Results       Procedure Component Value Units Date/Time    Urine Wallace Cullens Culture Hold Tube [[id number]] Collected: 04/25/23 2120    Specimen: Urine, Clean Catch Updated: 04/25/23  2139    Culture, Urine [[id number]] Collected: 04/25/23 2120    Specimen: Urine, Clean Catch Updated: 04/25/23 2139    Urinalysis with Microscopic Exam [[id number]] Collected: 04/25/23 2120    Specimen: Urine, Clean Catch Updated: 04/25/23 2138    Narrative:      The following orders were created for panel order Urinalysis with Microscopic Exam.  Procedure                               Abnormality         Status                     ---------                               -----------         ------                     Urinalysis with [employer]..Marland Kitchen[[id number]]                      In process                 Urine [employer] .Marland KitchenMarland Kitchen[[id number]]                      In process                   Please view results for these tests on the individual orders.    Urinalysis with Microscopic Exam [[id number]] Collected: 04/25/23 2120    Specimen: Urine, Clean Catch Updated: 04/25/23 2138    COVID-19 and Influenza ([place]) (symptomatic) [[id number]] Collected: 04/25/23 2122    Specimen: Swab from Anterior Nares Updated: 04/25/23 2138    Comprehensive Metabolic Panel [[id number]]  (Abnormal) Collected: 04/25/23 2023    Specimen: Blood, Venous Updated: 04/25/23 2134     Glucose 169 mg/dL      BUN 67 mg/dL      Creatinine 3.9 mg/dL      Sodium 010 mEq/L      Potassium 5.5 mEq/L      Chloride 106 mEq/L      CO2 21 mEq/L      Calcium 7.6 mg/dL      Anion Gap 93.2     GFR 13.8 mL/min/1.73 m2      AST (SGOT) 17 U/L      ALT 16 U/L      Alkaline Phosphatase 104 U/L      Albumin 2.6 g/dL      Protein, Total 7.0 g/dL      Globulin  4.4 g/dL      Albumin/Globulin Ratio 0.6     Bilirubin, Total 0.4 mg/dL     NT-ProBNP [[id number]] Collected: 04/25/23 2023    Specimen: Blood, Venous Updated: 04/25/23 2119    CBC with Differential [[id number]]  (Abnormal) Collected: 04/25/23 2023    Specimen: Blood, Venous Updated: 04/25/23 2057    Narrative:      The following orders were created for panel order CBC with Differential.  Procedure                                Abnormality         Status                     ---------                               -----------         ------                     CBC with Differential[[id number]]        Abnormal            Final result                 Please view results for these tests on the individual orders.    CBC with Differential [[id number]]  (Abnormal) Collected: 04/25/23 2023    Specimen: Blood, Venous Updated: 04/25/23 2057     WBC 16.08 x10 3/uL      Hemoglobin 8.4 g/dL      Hematocrit 08.6 %      Platelet Count 378 x10 3/uL      MPV 11.4 fL      RBC 3.12 x10 6/uL      MCV 89.1 fL      MCH 26.9 pg      MCHC 30.2 g/dL      RDW 14 %      nRBC % 0.0 /100 WBC      Absolute nRBC 0.00 x10 3/uL      Preliminary Absolute Neutrophil Count 11.29 x10 3/uL      Neutrophils % 70.3 %      Lymphocytes % 20.1 %      Monocytes % 6.1 %      Eosinophils % 2.2 %      Basophils % 0.4 %      Immature Granulocytes % 0.9 %      Absolute Neutrophils 11.29 x10 3/uL      Absolute Lymphocytes 3.23 x10 3/uL      Absolute Monocytes 0.98 x10 3/uL      Absolute Eosinophils 0.36 x10 3/uL      Absolute Basophils 0.07 x10 3/uL      Absolute Immature Granulocytes 0.15 x10 3/uL             Radiologic Studies -   Radiology Results (24 Hour)       ** No results found for the last 24 hours. **        .    Medical Decision Making   I am the first provider for this patient.    I reviewed the vital signs, available nursing notes, past medical history, past surgical history, family history and social history.      Vital Signs-Reviewed the patient's vital signs.   Patient  Vitals for the past 12 hrs:   BP Temp Pulse Resp   04/25/23 2053 168/79 -- 88 17   04/25/23 1952 171/80 98.9 F (37.2 C) 92 16       Pulse Oximetry Analysis - Normal     Cardiac Monitor: Interpreted by me. Rhythm:  Normal Sinus, Rate:  Normal, Ectopy:  None    EKG:  Interpreted by me: sinus tach, rate 88, nl PR, LVH, no STEMI. No sig change from 03 Apr 2023     Labs: All labs have been ordered,  reviewed and interpreted by me.  Xrays: Ordered, reviewed and interpreted by me, confirmed by radiology report.   CT/US/MRI: Ordered and reviewed  by me, confirmed by radiology report.     Critical Care Time:    Procedures      ED Course/ Provider Note/ MDM: ***:   Initial differential diagnosis to include but not limited to: electrolyte imbalance, infection, acute coronary syndrome, anemia, renal failure    ED Course as of 04/25/23 2200   Sat Apr 25, 2023   2000 HPI: 68 YOF wit hx of ESRD, DM, HTN presenting with complaint of generalized abdominal pain, nausea, vomiting, fatigue, bilateral flank pain and elevated BP. Pt has not yet started dialsysi but has upcoming appointment with IR for dialysis cath placement. Pt states she was recently treated for fluid overload and recently had urinary retention with placement of foley catheter. Foley was removed a few days ago. Pt has been able to urinate but reports dysuira and feeling of incomplete urination. No fever. No chills.  [ST]   2157 Creatinine(!): 3.9 [ST]   2157 BUN(!): 67 [ST]   2157 Glucose(!): 169 [ST]   2157 Potassium(!): 5.5 [ST]   2157 WBC(!): 16.08 [ST]   2157 Hemoglobin(!): 8.4 [ST]   2157 Hematocrit(!): 27.8 [ST]   2157 Platelet Count(!): 378 [ST]      ED Course User Index  [ST] Cherlyn Roberts, MD         Medical Decision Making  Amount and/or Complexity of Data Reviewed  Labs: ordered. Decision-making details documented in ED Course.  ECG/medicine tests: ordered.              Dr. Audley Hose is the primary emergency doctor of record.    Diagnosis     Clinical Impression: No diagnosis found.    Treatment Plan:   ED Disposition       None              _______________________________      This note was generated by the Epic EMR system/ Dragon speech recognition and may contain inherent errors or omissions not intended by the user. Grammatical errors, random word insertions, deletions and pronoun errors  are occasional consequences of this technology  due to software limitations. Not all errors are caught or corrected. If there are questions or concerns about the content of this note or information contained within the body of this dictation they should be addressed directly with the author for clarification. The use of the ED course in this note was to facilitate documentation. The time stamps of the HPI, ROS, physical exam,EKG interpretation within the ED course reflect the time these elements were documented in the chart, not the time they were physically completed.      _______________________________
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
## 2023-04-25 NOTE — ED Triage Notes (Signed)
[name] is a 42 y.o. female c/o of "kidney problem". Pt stated that she is supposed to start dialysis next week, but thinks it needs to be started sooner. Pt stated she last urinated this afternoon. Pt endorsed dizziness and difficulty breathing. Pt stated her kidney function is at "14%".    BP 171/80   Pulse 92   Temp 98.9 F (37.2 C) (Oral)   Resp 16   Ht 5\' 6"  (1.676 m)   Wt 131.5 kg   SpO2 99%   BMI 46.77 kg/m

## 2023-04-26 LAB — ECG 12-LEAD
Atrial Rate: 88 {beats}/min
IHS MUSE NARRATIVE AND IMPRESSION: NORMAL
P Axis: 47 degrees
P-R Interval: 158 ms
Q-T Interval: 380 ms
QRS Duration: 80 ms
QTC Calculation (Bezet): 459 ms
R Axis: -12 degrees
T Axis: 104 degrees
Ventricular Rate: 88 {beats}/min

## 2023-04-27 ENCOUNTER — Emergency Department
Admission: EM | Admit: 2023-04-27 | Discharge: 2023-04-28 | Disposition: A | Discharge status: Home / Self Care | Payer: No Typology Code available for payment source | Attending: Emergency Medicine | Admitting: Emergency Medicine

## 2023-04-27 ENCOUNTER — Emergency Department: Payer: No Typology Code available for payment source

## 2023-04-27 DIAGNOSIS — R110 Nausea: Secondary | ICD-10-CM | POA: Insufficient documentation

## 2023-04-27 DIAGNOSIS — G894 Chronic pain syndrome: Secondary | ICD-10-CM | POA: Insufficient documentation

## 2023-04-27 DIAGNOSIS — N182 Chronic kidney disease, stage 2 (mild): Secondary | ICD-10-CM | POA: Insufficient documentation

## 2023-04-27 DIAGNOSIS — Z7901 Long term (current) use of anticoagulants: Secondary | ICD-10-CM | POA: Insufficient documentation

## 2023-04-27 DIAGNOSIS — I509 Heart failure, unspecified: Secondary | ICD-10-CM | POA: Insufficient documentation

## 2023-04-27 DIAGNOSIS — I130 Hypertensive heart and chronic kidney disease with heart failure and stage 1 through stage 4 chronic kidney disease, or unspecified chronic kidney disease: Secondary | ICD-10-CM | POA: Insufficient documentation

## 2023-04-27 LAB — COMPREHENSIVE METABOLIC PANEL
ALT: 12 U/L (ref 0–55)
AST (SGOT): 11 U/L (ref 5–41)
Albumin/Globulin Ratio: 0.7 — ABNORMAL LOW (ref 0.9–2.2)
Albumin: 2.8 g/dL — ABNORMAL LOW (ref 3.5–5.0)
Alkaline Phosphatase: 102 U/L (ref 37–117)
Anion Gap: 10 (ref 5.0–15.0)
BUN: 53 mg/dL — ABNORMAL HIGH (ref 7–21)
Bilirubin, Total: 0.2 mg/dL (ref 0.2–1.2)
CO2: 21 mEq/L (ref 17–29)
Calcium: 8.3 mg/dL — ABNORMAL LOW (ref 8.5–10.5)
Chloride: 104 mEq/L (ref 99–111)
Creatinine: 3.9 mg/dL — ABNORMAL HIGH (ref 0.4–1.0)
GFR: 13.8 mL/min/{1.73_m2} — ABNORMAL LOW (ref >=60.0)
Globulin: 4.3 g/dL — ABNORMAL HIGH (ref 2.0–3.6)
Glucose: 111 mg/dL — ABNORMAL HIGH (ref 70–100)
Potassium: 4.9 mEq/L (ref 3.5–5.3)
Protein, Total: 7.1 g/dL (ref 6.0–8.3)
Sodium: 135 mEq/L (ref 135–145)

## 2023-04-27 LAB — LAB USE ONLY - CBC WITH DIFFERENTIAL
Absolute Basophils: 0.06 10*3/uL (ref 0.00–0.08)
Absolute Eosinophils: 0.3 10*3/uL (ref 0.00–0.44)
Absolute Immature Granulocytes: 0.15 10*3/uL — ABNORMAL HIGH (ref 0.00–0.07)
Absolute Lymphocytes: 3.44 10*3/uL — ABNORMAL HIGH (ref 0.42–3.22)
Absolute Monocytes: 0.93 10*3/uL — ABNORMAL HIGH (ref 0.21–0.85)
Absolute Neutrophils: 9.13 10*3/uL — ABNORMAL HIGH (ref 1.10–6.33)
Absolute nRBC: 0 10*3/uL (ref <=0.00)
Basophils %: 0.4 %
Eosinophils %: 2.1 %
Hematocrit: 27.8 % — ABNORMAL LOW (ref 34.7–43.7)
Hemoglobin: 8.4 g/dL — ABNORMAL LOW (ref 11.4–14.8)
Immature Granulocytes %: 1.1 %
Lymphocytes %: 24.6 %
MCH: 26.1 pg (ref 25.1–33.5)
MCHC: 30.2 g/dL — ABNORMAL LOW (ref 31.5–35.8)
MCV: 86.3 fL (ref 78.0–96.0)
MPV: 10.9 fL (ref 8.9–12.5)
Monocytes %: 6.6 %
Neutrophils %: 65.2 %
Platelet Count: 357 10*3/uL — ABNORMAL HIGH (ref 142–346)
Preliminary Absolute Neutrophil Count: 9.13 10*3/uL — ABNORMAL HIGH (ref 1.10–6.33)
RBC: 3.22 10*6/uL — ABNORMAL LOW (ref 3.90–5.10)
RDW: 14 % (ref 11–15)
WBC: 14.01 10*3/uL — ABNORMAL HIGH (ref 3.10–9.50)
nRBC %: 0 /100 WBC (ref <=0.0)

## 2023-04-27 LAB — CULTURE, URINE: Culture Urine: NORMAL

## 2023-04-27 MED ORDER — HYDROMORPHONE HCL 1 MG/ML IJ SOLN
1.0000 mg | Freq: Once | INTRAMUSCULAR | Status: AC
Start: 2023-04-27 — End: 2023-04-28
  Administered 2023-04-28: 1 mg via INTRAVENOUS
  Filled 2023-04-27: qty 1
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
## 2023-04-27 NOTE — ED Triage Notes (Addendum)
Pt states stage 5 kidney failure, awaiting dialysis. States she is having generalized pain, back pain, HA, and CP. C/o N/V. Pt states she has had increased weakness with recent falls. Pt states she had syncopal episode on Saturday. Pt A/O x4. Unlabored even RR.

## 2023-04-28 LAB — LAB USE ONLY - URINALYSIS WITH REFLEX TO MICROSCOPIC EXAM AND CULTURE
Urine Bilirubin: NEGATIVE
Urine Ketones: NEGATIVE mg/dL
Urine Leukocyte Esterase: NEGATIVE
Urine Nitrite: NEGATIVE
Urine Specific Gravity: 1.016 (ref 1.001–1.035)
Urine Urobilinogen: NORMAL mg/dL (ref 0.2–2.0)
Urine pH: 7 (ref 5.0–8.0)

## 2023-04-28 LAB — LAB USE ONLY - URINE GRAY CULTURE HOLD TUBE

## 2023-04-28 LAB — ECG 12-LEAD
Atrial Rate: 90 {beats}/min
IHS MUSE NARRATIVE AND IMPRESSION: NORMAL
P Axis: 65 degrees
P-R Interval: 126 ms
Q-T Interval: 372 ms
QRS Duration: 78 ms
QTC Calculation (Bezet): 455 ms
R Axis: -16 degrees
T Axis: -89 degrees
Ventricular Rate: 90 {beats}/min

## 2023-04-28 MED ORDER — HYDROCODONE-ACETAMINOPHEN 5-325 MG PO TABS
1.0000 | ORAL_TABLET | Freq: Four times a day (QID) | ORAL | 0 refills | Status: AC | PRN
Start: 2023-04-28 — End: 2023-05-05

## 2023-04-28 MED ORDER — ONDANSETRON 4 MG PO TBDP
4.0000 mg | ORAL_TABLET | Freq: Four times a day (QID) | ORAL | 0 refills | Status: AC | PRN
Start: 2023-04-28 — End: ?

## 2023-04-28 MED ORDER — OXYCODONE HCL 5 MG PO TABS
2.5000 mg | ORAL_TABLET | Freq: Four times a day (QID) | ORAL | Status: DC | PRN
Start: 2023-04-28 — End: 2023-04-28
  Administered 2023-04-28: 2.5 mg via ORAL
  Filled 2023-04-28: qty 1

## 2023-04-28 NOTE — Discharge Instructions (Addendum)
Dear [name]:    Thank you for choosing the [facility] Emergency Department, the premier emergency department in the [place] area.  I hope your visit today was EXCELLENT. You will receive a survey via text message that will give you the opportunity to provide feedback to your team about your visit. Please do not hesitate to reach out with any questions!    Specific instructions for your visit today:    IF YOU DO NOT CONTINUE TO IMPROVE OR YOUR CONDITION WORSENS, PLEASE CONTACT YOUR DOCTOR OR RETURN IMMEDIATELY TO THE EMERGENCY DEPARTMENT.    Sincerely,  Salley Scarlet Hoy Finlay, MD  Attending Emergency Physician  [facility] Emergency Department      OBTAINING A PRIMARY CARE APPOINTMENT    Primary care physicians (PCPs, also known as primary care doctors) are either internists or family medicine doctors. Both types of PCPs focus on health promotion, disease prevention, patient education and counseling, and treatment of acute and chronic medical conditions.    If you need a primary care doctor, please call the below number and ask who is receiving new patients.     [facility]  Telephone:  [phone]  [email or web address]    DOCTOR REFERRALS  Call [phone] (available 24 hours a day, 7 days a week) if you need any further referrals and we can help you find a primary care doctor or specialist.  Also, available online at:  [email or web address]    YOUR CONTACT INFORMATION  Before leaving please check with registration to make sure we have an up-to-date contact number.  You can call registration at [phone] to update your information.  For questions about your hospital bill, please call [phone].  For questions about your Emergency Dept Physician bill please call [phone].      FREE HEALTH SERVICES  If you need help with health or social services, please call 2-1-1 for a free referral to resources in your area.  2-1-1 is a free service connecting people with  information on health insurance, free clinics, pregnancy, mental health, dental care, food assistance, housing, and substance abuse counseling.  Also, available online at:  http://www.[place].org    ORTHOPEDIC INJURY   Please know that significant injuries can exist even when an initial x-ray is read as normal or negative.  This can occur because some fractures (broken bones) are not initially visible on x-rays.  For this reason, close outpatient follow-up with your primary care doctor or bone specialist (orthopedist) is required.    MEDICATIONS AND FOLLOWUP  Please be aware that some prescription medications can cause drowsiness.  Use caution when driving or operating machinery.    The examination and treatment you have received in our Emergency Department is provided on an emergency basis, and is not intended to be a substitute for your primary care physician.  It is important that your doctor checks you again and that you report any new or remaining problems at that time.      ASSISTANCE WITH INSURANCE    Affordable Care Act  [facility])  Call to start or finish an application, compare plans, enroll or ask a question.  [phone]  TTY: ([phone]  Web:  Healthcare.gov    Help Enrolling in [facility]  [employer] [place]  613-367-5325 (TOLL-FREE)  ([phone]) [phone] (TTY)  Web:  Http://www.coverva.org    Local Help Enrolling in the [facility]  [place] [facility]  ([phone] (MAIN)  Email:  health-help@nvfs .org  Web:  BlackjackMyths.is  Address:  [address]  [address] [place], [place] [address]    SEDATING MEDICATIONS  Sedating medications include strong pain medications (e.g. narcotics), muscle relaxers, benzodiazepines (used for anxiety and as muscle relaxers), Benadryl/diphenhydramine and other antihistamines for allergic reactions/itching, and other medications.  If you are unsure if you have received a sedating medication, please ask your physician or nurse.  If you received a sedating  medication: DO NOT drive a car. DO NOT operate machinery. DO NOT perform jobs where you need to be alert.  DO NOT drink alcoholic beverages while taking this medicine.     If you get dizzy, sit or lie down at the first signs. Be careful going up and down stairs.  Be extra careful to prevent falls.     Never give this medicine to others.     Keep this medicine out of reach of children.     Do not take or save old medicines. Throw them away when outdated.     Keep all medicines in a cool, dry place. DO NOT keep them in your bathroom medicine cabinet or in a cabinet above the stove.    MEDICATION REFILLS  Please be aware that we cannot refill any prescriptions through the ER. If you need further treatment from what is provided at your ER visit, please follow up with your primary care doctor or your pain management specialist.    [place]  Did you know [facility] has two freestanding ERs located just a few miles away?  [facility] [place] have short wait times, easy free parking directly in front of the building and top patient satisfaction scores - and the same Board Certified Emergency Medicine doctors as [facility]
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
## 2023-06-19 NOTE — Progress Notes (Signed)
CARDIOTHORACIC AND VASCULAR SURGERY FOLLOW-UP NOTE    Surgeon:  Dr. Sharee Holster  Surgery: CREATION OF AVGRAFT  Left upper arm"using 4-7 mm Goretex graft  Findings:  Satisfactory artery and vein.  Vein did not mature   Date: 06/17/2023  Post operative course significant for none    Assessment/Plan  1. S/P arteriovenous (AV) fistula creation       -Doing well after surgery without complications except for pain, numbness/weakness in fingers  -Continue routine post-operative instructions.  -Keep same follow up visit as previously scheduled  -Will help her get scheduled for an ultrasound of left arm to rule out steal although she has good distal pulses   -Instructed to elevate extremity as much as possible     Subjective  Significant post discharge events  No fevers or drainage  Numb/slightly cold fingers in L hand   L arm is extremely sensitive to touch     Objective  Cardiovascular: regular rate and rhythm, S1, S2 normal, no murmur, click, rub or gallop  Lungs: clear to auscultation bilaterally, normal work of breathing  Extremities: R arm warm and well-perfused, peripheral pulses intact, no cyanosis, clubbing, or edema  Incision(s): L arm under bandage  bruising, no drainage   L AVF with +bruit +thrill and distal pulses palpable but confirmed by doppler  Grasp strength on left hand slightly weaker  Mild edema in L arm     Vitals: BP 130/76   Pulse 77   Wt 136 kg (300 lb)   SpO2 97%   BMI 43.05 kg/m   OB Status Hysterectomy     Quality Measures:  Discussed with patient the BMI and ideal weight. Explained that obesity can lead to or be associated with other medical conditions including; diabetes, cardiovascular disease, osteoarthritis and sleep apnea. Explained the importance of weight loss which could be achieved through a variety of methods and recommended follow up with the patients primary care provider

## 2023-06-20 ENCOUNTER — Emergency Department
Admission: EM | Admit: 2023-06-20 | Discharge: 2023-06-21 | Disposition: A | Discharge status: Home / Self Care | Payer: No Typology Code available for payment source | Attending: Emergency Medicine | Admitting: Emergency Medicine

## 2023-06-20 ENCOUNTER — Emergency Department: Payer: No Typology Code available for payment source

## 2023-06-20 DIAGNOSIS — T7840XA Allergy, unspecified, initial encounter: Secondary | ICD-10-CM

## 2023-06-20 DIAGNOSIS — T782XXA Anaphylactic shock, unspecified, initial encounter: Secondary | ICD-10-CM | POA: Insufficient documentation

## 2023-06-20 DIAGNOSIS — R079 Chest pain, unspecified: Secondary | ICD-10-CM

## 2023-06-20 DIAGNOSIS — T783XXA Angioneurotic edema, initial encounter: Secondary | ICD-10-CM

## 2023-06-20 DIAGNOSIS — N189 Chronic kidney disease, unspecified: Secondary | ICD-10-CM | POA: Insufficient documentation

## 2023-06-20 DIAGNOSIS — X58XXXA Exposure to other specified factors, initial encounter: Secondary | ICD-10-CM | POA: Insufficient documentation

## 2023-06-20 LAB — VENOUS EG7 POCT
Calcium Ionized POCT: 2.2 mEq/L — ABNORMAL LOW (ref 2.44–2.64)
FIO2: 21
Hematocrit POCT: 28 % — ABNORMAL LOW (ref 34.7–43.7)
Hemoglobin POCT: 9.5 g/dL — ABNORMAL LOW (ref 12.0–15.6)
Patient Temperature: 97.6
Potassium POCT: 4.7 mEq/L (ref 3.5–4.9)
Sodium POCT: 139 mEq/L (ref 136–146)
Venous Base Excess POCT: -5 mEq/L — ABNORMAL LOW (ref <=6.0)
Venous HCO3 Bicarbonate: 20.6 mEq/L — ABNORMAL LOW (ref 23.5–31.3)
Venous O2 Saturation POCT: 93 % (ref 24.0–95.0)
Venous Total CO2: 22 mEq/L — ABNORMAL LOW (ref 23.0–33.0)
Venous pCO2 POCT: 40.1 mmHg — ABNORMAL LOW (ref 41.0–58.0)
Venous pH POCT: 7.315 (ref 7.310–7.410)
Venous pO2 POCT: 69 mmHg — ABNORMAL HIGH (ref 17.0–59.0)

## 2023-06-20 LAB — ECG 12-LEAD
Atrial Rate: 99 {beats}/min
Atrial Rate: 91 {beats}/min
IHS MUSE NARRATIVE AND IMPRESSION: NORMAL
IHS MUSE NARRATIVE AND IMPRESSION: NORMAL
P Axis: 50 degrees
P Axis: 38 degrees
P-R Interval: 148 ms
P-R Interval: 144 ms
Q-T Interval: 348 ms
Q-T Interval: 372 ms
QRS Duration: 76 ms
QRS Duration: 82 ms
QTC Calculation (Bezet): 446 ms
QTC Calculation (Bezet): 457 ms
R Axis: 47 degrees
R Axis: 1 degrees
T Axis: 22 degrees
T Axis: 69 degrees
Ventricular Rate: 99 {beats}/min
Ventricular Rate: 91 {beats}/min

## 2023-06-20 LAB — LAB USE ONLY - CBC WITH DIFFERENTIAL
Absolute Basophils: 0.05 10*3/uL (ref 0.00–0.08)
Absolute Eosinophils: 0.23 10*3/uL (ref 0.00–0.44)
Absolute Immature Granulocytes: 0.16 10*3/uL — ABNORMAL HIGH (ref 0.00–0.07)
Absolute Lymphocytes: 7.82 10*3/uL — ABNORMAL HIGH (ref 0.42–3.22)
Absolute Monocytes: 1.06 10*3/uL — ABNORMAL HIGH (ref 0.21–0.85)
Absolute Neutrophils: 6.07 10*3/uL (ref 1.10–6.33)
Absolute nRBC: 0 10*3/uL (ref <=0.00)
Basophils %: 0.3 %
Eosinophils %: 1.5 %
Hematocrit: 28.9 % — ABNORMAL LOW (ref 34.7–43.7)
Hemoglobin: 8.7 g/dL — ABNORMAL LOW (ref 11.4–14.8)
Immature Granulocytes %: 1 %
Lymphocytes %: 50.8 %
MCH: 26.5 pg (ref 25.1–33.5)
MCHC: 30.1 g/dL — ABNORMAL LOW (ref 31.5–35.8)
MCV: 88.1 fL (ref 78.0–96.0)
MPV: 10.8 fL (ref 8.9–12.5)
Monocytes %: 6.9 %
Neutrophils %: 39.5 %
Platelet Count: 466 10*3/uL — ABNORMAL HIGH (ref 142–346)
Preliminary Absolute Neutrophil Count: 6.07 10*3/uL (ref 1.10–6.33)
RBC: 3.28 10*6/uL — ABNORMAL LOW (ref 3.90–5.10)
RDW: 14 % (ref 11–15)
WBC: 15.39 10*3/uL — ABNORMAL HIGH (ref 3.10–9.50)
nRBC %: 0 /100 WBC (ref <=0.0)

## 2023-06-20 LAB — HEPATIC FUNCTION PANEL (LFT)
ALT: 12 U/L (ref 0–55)
AST (SGOT): 10 U/L (ref 5–41)
Albumin/Globulin Ratio: 0.6 — ABNORMAL LOW (ref 0.9–2.2)
Albumin: 2.7 g/dL — ABNORMAL LOW (ref 3.5–5.0)
Alkaline Phosphatase: 109 U/L (ref 37–117)
Bilirubin Direct: 0.1 mg/dL (ref 0.0–0.5)
Bilirubin Indirect: 0.1 mg/dL — ABNORMAL LOW (ref 0.2–1.0)
Bilirubin, Total: 0.2 mg/dL (ref 0.2–1.2)
Globulin: 4.5 g/dL — ABNORMAL HIGH (ref 2.0–3.6)
Protein, Total: 7.2 g/dL (ref 6.0–8.3)

## 2023-06-20 LAB — PT/INR
INR: 1.1 (ref 0.9–1.1)
PT: 12.1 s (ref 10.1–12.9)

## 2023-06-20 LAB — BASIC METABOLIC PANEL
Anion Gap: 13 (ref 5.0–15.0)
BUN: 78 mg/dL — ABNORMAL HIGH (ref 7–21)
CO2: 18 mEq/L (ref 17–29)
Calcium: 8.2 mg/dL — ABNORMAL LOW (ref 8.5–10.5)
Chloride: 107 mEq/L (ref 99–111)
Creatinine: 4.3 mg/dL — ABNORMAL HIGH (ref 0.4–1.0)
GFR: 12.3 mL/min/{1.73_m2} — ABNORMAL LOW (ref >=60.0)
Glucose: 126 mg/dL — ABNORMAL HIGH (ref 70–100)
Potassium: 4.9 mEq/L (ref 3.5–5.3)
Sodium: 138 mEq/L (ref 135–145)

## 2023-06-20 LAB — PROCALCITONIN: Procalcitonin: 0.07 ng/ml (ref 0.00–0.10)

## 2023-06-20 LAB — WHOLE BLOOD GLUCOSE POCT: Whole Blood Glucose POCT: 144 mg/dL — ABNORMAL HIGH (ref 70–100)

## 2023-06-20 LAB — NT-PROBNP: NT-ProBNP: 634 pg/mL — ABNORMAL HIGH (ref <125)

## 2023-06-20 LAB — HIGH SENSITIVITY TROPONIN-I: hs Troponin: 8 ng/L (ref <=14.0)

## 2023-06-20 LAB — ETHANOL (ALCOHOL) LEVEL: Alcohol: NOT DETECTED

## 2023-06-20 LAB — MAGNESIUM: Magnesium: 1.5 mg/dL — ABNORMAL LOW (ref 1.6–2.6)

## 2023-06-20 MED ORDER — TRANEXAMIC ACID-NACL 1000-0.7 MG/100ML-% IV SOLN
1000.0000 mg | Freq: Once | INTRAVENOUS | Status: AC
Start: 2023-06-20 — End: 2023-06-20
  Administered 2023-06-20: 1000 mg via INTRAVENOUS
  Filled 2023-06-20: qty 100

## 2023-06-20 MED ORDER — DIPHENHYDRAMINE HCL 50 MG/ML IJ SOLN
50.0000 mg | Freq: Once | INTRAMUSCULAR | Status: AC
Start: 2023-06-20 — End: 2023-06-20
  Administered 2023-06-20: 50 mg via INTRAVENOUS
  Filled 2023-06-20: qty 1

## 2023-06-20 MED ORDER — METHYLPREDNISOLONE SODIUM SUCC 125 MG IJ SOLR (WRAP)
125.0000 mg | Freq: Once | INTRAMUSCULAR | Status: AC
Start: 2023-06-20 — End: 2023-06-20
  Administered 2023-06-20: 125 mg via INTRAVENOUS
  Filled 2023-06-20: qty 2

## 2023-06-20 MED ORDER — EPINEPHRINE HCL 1 MG/ML ADULT ANAPHYLAXIS KIT
0.3000 mg | Freq: Once | INTRAMUSCULAR | Status: AC
Start: 2023-06-20 — End: 2023-06-20
  Administered 2023-06-20: 0.3 mg via INTRAMUSCULAR
  Filled 2023-06-20: qty 1

## 2023-06-20 MED ORDER — FAMOTIDINE 10 MG/ML IV SOLN (WRAP)
20.0000 mg | Freq: Once | INTRAVENOUS | Status: AC
Start: 2023-06-20 — End: 2023-06-20
  Administered 2023-06-20: 20 mg via INTRAVENOUS
  Filled 2023-06-20: qty 2

## 2023-06-20 MED ORDER — ALBUTEROL-IPRATROPIUM 2.5-0.5 (3) MG/3ML IN SOLN
3.0000 mL | Freq: Once | RESPIRATORY_TRACT | Status: AC
Start: 2023-06-20 — End: 2023-06-20
  Administered 2023-06-20: 3 mL via RESPIRATORY_TRACT
  Filled 2023-06-20: qty 3
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
## 2023-06-20 NOTE — ED Provider Notes (Signed)
[facility] EMERGENCY DEPARTMENT  ATTENDING PHYSICIAN HISTORY AND PHYSICAL EXAM     Patient Name: [name], [name]  Department:[facility] EM[facility]  Encounter Date:  06/20/2023  Attending Physician: Faythe Dingwall, MD   Age: 42 y.o. female  Patient Room: S 20/S 20  PCP: Alyson Locket, DO           Diagnosis/Disposition:     Final diagnoses:   None       ED Disposition       None            Follow-Up Providers (if applicable)    No follow-up provider specified.     New Prescriptions    No medications on file           Medical Decision Making:   {THIS REVIEW SECTION WILL AUTODELETE ONCE NOTE IS SIGNED  Action Links   Snapshot  Orders  Decision Support  Secure Chat  Transfer Documents  Patient Care Timeline  Workup  Dispo  END REVIEW SECTION(Optional):55325}    [name] is a 42 y.o. female presenting for ***  Vitals reviewed by me =  ***.   Nursing notes reviewed by me ***    Initial Differential Diagnosis:  Initial differential diagnosis to include but not limited to: {VWU:98119}    Plan:  ***    ED Course as of 06/20/23 2131   Sat Jun 20, 2023   2129 I discussed with the ED pharmacy team regarding risks and benefits of administering TXA for treatment of angioedema in this patient with multiple thrombotic comorbidities including DVT, PE, and fistula placement.  After extensive discussion, I decided to move forward with TXA administration.  Patient and her family have been made aware and agree with plan [MH]      ED Course User Index  [MH] ,        Final Impression:  ***  {DISPO JYNWGNF:62130}      Medical Decision Making  Amount and/or Complexity of Data Reviewed  Labs: ordered.  Radiology: ordered.  ECG/medicine tests: ordered.    Risk  Prescription drug management.      Records Reviewed (internal and external)? : N/A  {Was management discussed with a consultant? (Optional):61519::"N/A"}  Diagnostic test considered and not performed : N/A  Prescription medications considered and  not given : N/A  {Hospitalization considered but not done (Optional):61523}  Was the decision around the need for surgery discussed with a consultant? : N/A  Social Determinants of Health Considerations : N/A  Was there decision to not resuscitate or to de-escalate care due to poor prognosis? : N/A        History of Presenting Illness:   {THIS REVIEW SECTION WILL AUTODELETE ONCE NOTE IS SIGNED  History Review Links   Snapshot  Problem List  Care Everywhere  PMH  PSH  FamHx  SocHx  Allergies  Meds  Immunizations  END REVIEW SECTION(Optional):55325}  Nursing Triage note: Came in to ED ambulatory c/o sudden onset of tongue swelling and SOB, per daughter at bedside, symptoms happened before, given benadryl at home w/o relief. Pt in acute distress, tachpneic. A&Ox4. L arm fistula placed 2 days ago.  Chief complaint: Oral Swelling and Allergic Reaction    [name] is a 42 y.o. female with PMH significant for CKD, T2DM,, HTN, asthma, DVT (on Eliquis), HLD, and s/p LUE AV fistula placement 2 days ago.     She presents with concerns of an acute onset of oropharyngeal swelling, diffuse  itchiness and SOB in the setting of an allergic reaction.  Patient reports being otherwise in her usual state of health prior to onset of symptoms.  Daughter at bedside states that she developed symptoms of dyspnea associated with swelling of the lips, tongue, and submandibular edema.  Able to tolerate PO in addition to her oral secretions.  No associated chest pain, chest tightness, or pleurisy.  Symptoms progressed with diffuse itchiness throughout the body.  Daughter notes that patient has had similar episodes in the past requiring epinephrine.  Patient unable to locate epi at home, has ultimately took Benadryl, without significant relief.  Concern for the persistence of the SOB prompted this ED visit for further evaluation.    Patient otherwise able to maintain airway.  Unclear source of the allergen which triggered  today's allergic reaction.  Patient notes allergies to Lisinopril and Bactrim, otherwise no other known allergies.     With noting, patient recently had changes in her dosing of Bumex, Nifedipine, and recent placement of AV fistula for which patient has been taking oxycodone.  No other recent exposures to medications.  No recent lotions, soaps, creams, new clothing, or any other possible allergen exposure reported.        Review of Systems:  Physical Exam:     Review of Systems    Positive and negative ROS per above and in HPI. All other systems reviewed and negative.     Pulse (!) 101  BP 129/58  Resp (!) 26  SpO2 97 %  Temp 97.6 F (36.4 C)     General appearance - alert, well appearing, and in no distress  Mental status - alert, oriented to person, place, and time  Eyes - pupils equal and reactive, extraocular eye movements intact  Ears - bilateral TM's and external ear canals normal  Nose - normal and patent, no erythema, discharge or polyps  Mouth -lip swelling, tongue swelling, sublingual swelling, submandibular swelling.  Able to visualize the superior aspect of the posterior oropharynx.  Mallampati III.  Otherwise mucous membranes moist, pharynx normal without lesions  Neck - supple, no significant adenopathy  Lymphatics - no palpable lymphadenopathy, no hepatosplenomegaly  Chest - clear to auscultation, no wheezes, rales or rhonchi, symmetric air entry  Heart - normal rate, regular rhythm, normal S1, S2, no murmurs, rubs, clicks or gallops  Abdomen - soft, nontender, nondistended, no masses or organomegaly  Back exam - full range of motion, no tenderness, palpable spasm or pain on motion  Neurological - alert, oriented, normal speech, no focal findings or movement disorder noted  Musculoskeletal - LUE AV fistula in place, no joint tenderness, deformity or swelling  Extremities - peripheral pulses normal, no pedal edema, no clubbing or cyanosis  Skin - normal coloration, no rashes, no suspicious skin  lesions noted        Interpretations, Clinical Decision Tools and Critical Care:   {THIS REVIEW SECTION WILL AUTODELETE ONCE NOTE IS SIGNED  Interpretation/Data Review Links   Snapshot  Patient Care Timeline  Workup Results Review  EKG  Labs  Imaging  Media  Encounter Orders   END REVIEW SECTION(Optional):55325}    O2 Sat:  The patient's oxygen saturation was 97 % on room air. This was independently interpreted by me as Normal.     Radiology:  I reviewed and independently interpreted the following radiology studies:   XR Chest -no acute cardiopulmonary process    EKG: I reviewed and Independently interpreted the patient's EKG as {QIONGE:95284}  Cardiac Monitoring: I independely reviewed and interpreted the patient's rhythm strip as sinus tachycardia with a rate of 106 bpm.       Critical Care Time(not including procedures): *** minutes.  Due to the high risk of critical illness or multi-organ failure at initial presentation and/or during ED course.   System(s) at risk for compromise:  {JHORGANSYSTEMS:25027::"circulatory","respiratory"}  Critical Diagnosis: No diagnosis found.   The patient was Hypotensive:   No    The patient was Hypoxic:   No    This does not including time spent performing other reported procedures or services.  Critical care time involved full attention to the patient's condition and included:   Review of nursing notes and/or old charts - Yes  Documentation time - Yes  Care, transfer of care, and discharge plans - Yes  Obtaining necessary history from family, EMS, nursing home staff and/or treating physicians - Yes  Review of medications, allergies, and vital signs - Yes   Consultant collaboration on findings and treatment options - Yes  Ordering, interpreting, and reviewing diagnostic studies/tab tests - Yes         Point of Care Ultrasound results if available =  Imaging Results    None               Procedures:   Procedures      Attestations:     Scribe Attestation:     I was acting  as a Neurosurgeon for [employer], Alton Revere, MD on [name]  Treatment Team: Scribe: Kathi Der    I am the first provider for this patient and I personally performed the services documented. Treatment Team: Scribe: ,  is scribing for me on Salo,Linnie TALLEY. This note accurately reflects work and decisions made by me.  Motalib, Alton Revere, MD      Documentation Notes:  Parts of this note were generated by the Epic EMR system/ Dragon speech recognition and may contain inherent errors or omissions not intended by the user. Grammatical errors, random word insertions, deletions, pronoun errors and incomplete sentences are occasional consequences of this technology due to software limitations. Not all errors are caught or corrected.  My documentation is often completed after the patient is no longer under my clinical care. In some cases, the Epic EMR may pull updated results into the above documentation which may not reflect all results or information that were available to me at the time of my medical decision making.   If there are questions or concerns about the content of this note or information contained within the body of this dictation they should be addressed directly with the author for clarification.

## 2023-06-20 NOTE — ED Notes (Signed)
BG 144

## 2023-06-20 NOTE — ED Provider Notes (Signed)
Triage MD Note:  I am not the primary provider. This patient has been seen by me or placed for labs/radiology studies per request of the Triage RN staff.     41yo F hx CKD recently placed LUE fistula presenting with lip and tongue swelling. Itchiness, hives. Allergies to Lisinopril and Bactrim. But has not taken any meds aside from Oxy due to fistula placement 2 days ago. Swollen lips and tongue on exam. Daughter states this has happened multiple times where she's had hives and required epi at home but they are in the process of moving and they can't find her epi. Took Benadryl PTA. Still with some shortness of breath     Maurine Simmering, MD  06/20/23 2102

## 2023-06-20 NOTE — ED Triage Notes (Signed)
See quick triage note. Arrives to room lethargic, and states she feels like she cannot breath. Lips appear swollen.

## 2023-06-21 LAB — HIGH SENSITIVITY TROPONIN-I WITH DELTA
hs Troponin-I Delta: 2
hs Troponin: 10.1 ng/L (ref <=14.0)

## 2023-06-21 MED ORDER — EPINEPHRINE 0.3 MG/0.3ML IJ SOAJ
0.3000 mg | INTRAMUSCULAR | 0 refills | Status: AC | PRN
Start: 2023-06-21 — End: ?

## 2023-06-21 MED ORDER — PREDNISONE 20 MG PO TABS
40.0000 mg | ORAL_TABLET | Freq: Every day | ORAL | 0 refills | Status: AC
Start: 2023-06-21 — End: 2023-06-26

## 2023-06-21 NOTE — Discharge Instructions (Signed)
Dear Ms. Derocher:    Thank you for choosing the [facility] Emergency Department, the premier emergency department in the [place] area.  I hope your visit today was EXCELLENT. You will receive a survey via text message that will give you the opportunity to provide feedback to your team about your visit. Please do not hesitate to reach out with any questions!    Specific instructions for your visit today:      IF YOU DO NOT CONTINUE TO IMPROVE OR YOUR CONDITION WORSENS, PLEASE CONTACT YOUR DOCTOR OR RETURN IMMEDIATELY TO THE EMERGENCY DEPARTMENT.    Sincerely,  Motalib, Alton Revere, MD  Attending Emergency Physician  [facility] Emergency Department      OBTAINING A PRIMARY CARE APPOINTMENT    Primary care physicians (PCPs, also known as primary care doctors) are either internists or family medicine doctors. Both types of PCPs focus on health promotion, disease prevention, patient education and counseling, and treatment of acute and chronic medical conditions.    If you need a primary care doctor, please call the below number and ask who is receiving new patients.     [facility]  Telephone:  [phone]  [email or web address]    DOCTOR REFERRALS  Call [phone] (available 24 hours a day, 7 days a week) if you need any further referrals and we can help you find a primary care doctor or specialist.  Also, available online at:  [email or web address]    YOUR CONTACT INFORMATION  Before leaving please check with registration to make sure we have an up-to-date contact number.  You can call registration at [phone] to update your information.  For questions about your hospital bill, please call [phone].  For questions about your Emergency Dept Physician bill please call [phone].      FREE HEALTH SERVICES  If you need help with health or social services, please call 2-1-1 for a free referral to resources in your area.  2-1-1 is a free service connecting people with  information on health insurance, free clinics, pregnancy, mental health, dental care, food assistance, housing, and substance abuse counseling.  Also, available online at:  http://www.211[place].org    ORTHOPEDIC INJURY   Please know that significant injuries can exist even when an initial x-ray is read as normal or negative.  This can occur because some fractures (broken bones) are not initially visible on x-rays.  For this reason, close outpatient follow-up with your primary care doctor or bone specialist (orthopedist) is required.    MEDICATIONS AND FOLLOWUP  Please be aware that some prescription medications can cause drowsiness.  Use caution when driving or operating machinery.    The examination and treatment you have received in our Emergency Department is provided on an emergency basis, and is not intended to be a substitute for your primary care physician.  It is important that your doctor checks you again and that you report any new or remaining problems at that time.      ASSISTANCE WITH INSURANCE    Affordable Care Act  [facility])  Call to start or finish an application, compare plans, enroll or ask a question.  [phone]  TTY: [phone]  Web:  Healthcare.gov    Help Enrolling in [facility]  [employer] [place]  747 244 2304 (TOLL-FREE)  ([phone] (TTY)  Web:  Http://www.coverva.org    Local Help Enrolling in the [facility]  [place] [facility]  [phone] (MAIN)  Email:  health-help@nvfs .org  Web:  BlackjackMyths.is  Address:  [phone]  [address] [place], [place] [address]    SEDATING MEDICATIONS  Sedating medications include strong pain medications (e.g. narcotics), muscle relaxers, benzodiazepines (used for anxiety and as muscle relaxers), Benadryl/diphenhydramine and other antihistamines for allergic reactions/itching, and other medications.  If you are unsure if you have received a sedating medication, please ask your physician or nurse.  If you received a sedating  medication: DO NOT drive a car. DO NOT operate machinery. DO NOT perform jobs where you need to be alert.  DO NOT drink alcoholic beverages while taking this medicine.     If you get dizzy, sit or lie down at the first signs. Be careful going up and down stairs.  Be extra careful to prevent falls.     Never give this medicine to others.     Keep this medicine out of reach of children.     Do not take or save old medicines. Throw them away when outdated.     Keep all medicines in a cool, dry place. DO NOT keep them in your bathroom medicine cabinet or in a cabinet above the stove.    MEDICATION REFILLS  Please be aware that we cannot refill any prescriptions through the ER. If you need further treatment from what is provided at your ER visit, please follow up with your primary care doctor or your pain management specialist.    FREESTANDING EMERGENCY DEPARTMENTS OF [facility]  Did you know [facility] has two freestanding ERs located just a few miles away?  [facility] and [facility] [place] have short wait times, easy free parking directly in front of the building and top patient satisfaction scores - and the same Board Certified Emergency Medicine doctors as [facility].

## 2023-08-06 ENCOUNTER — Encounter (INDEPENDENT_AMBULATORY_CARE_PROVIDER_SITE_OTHER): Payer: Self-pay | Admitting: Family

## 2023-12-06 IMAGING — CR DG HAND COMPLETE 3+V*R*
3 series · 3 of 3 positions shown · non-contrast
Comparison: None.

CLINICAL DATA: Pain and swelling.  Surgery one month ago.

EXAM:
RIGHT HAND - COMPLETE 3+ VIEW

[hand ap]
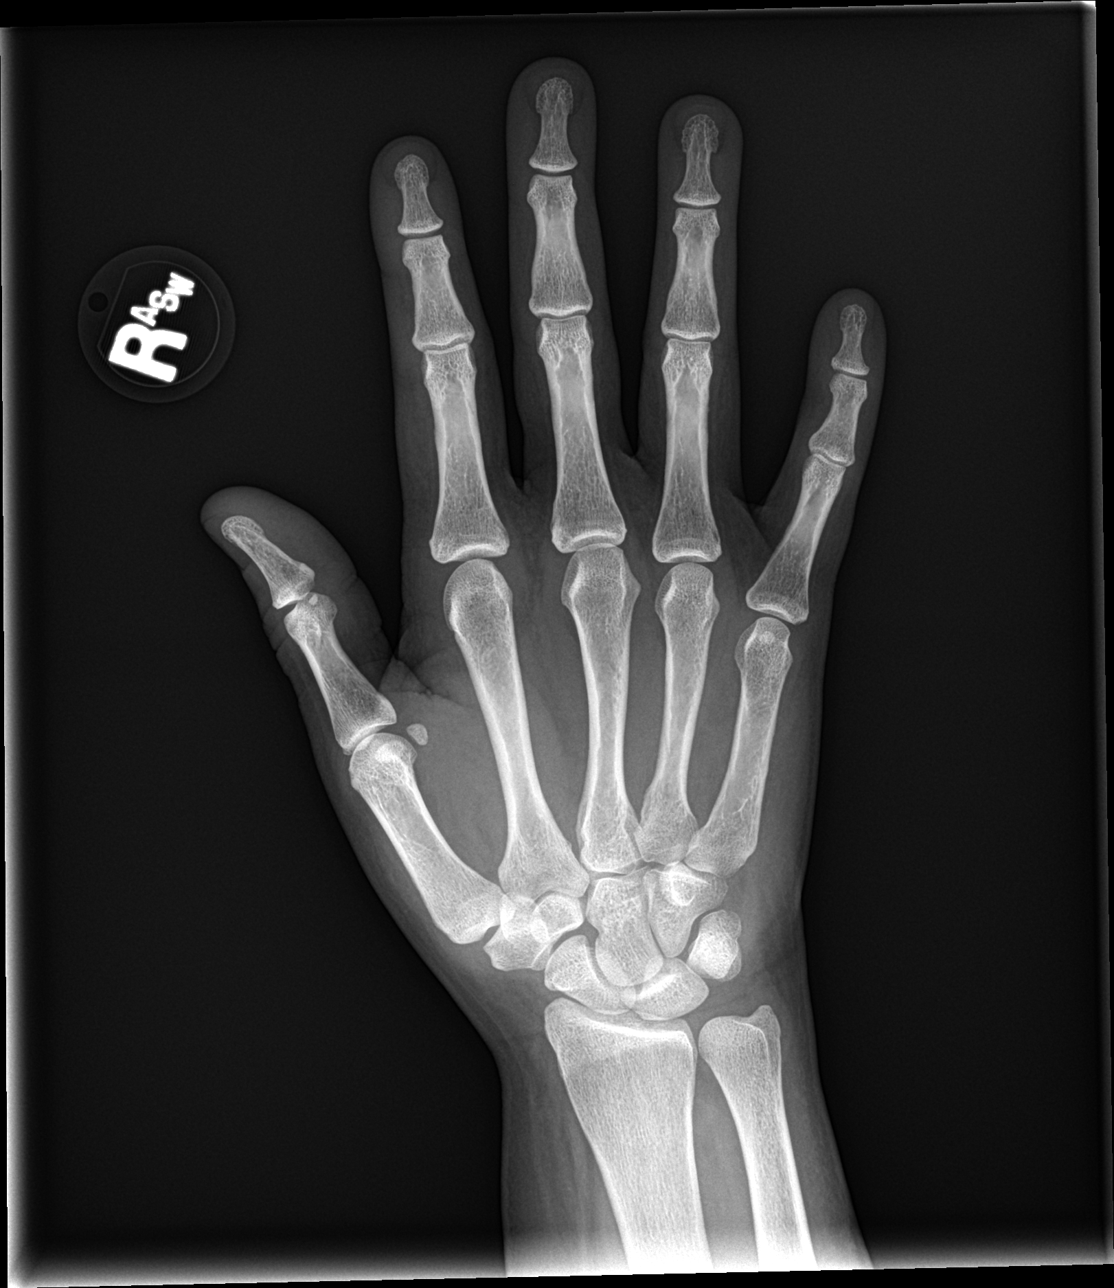

[hand obl]
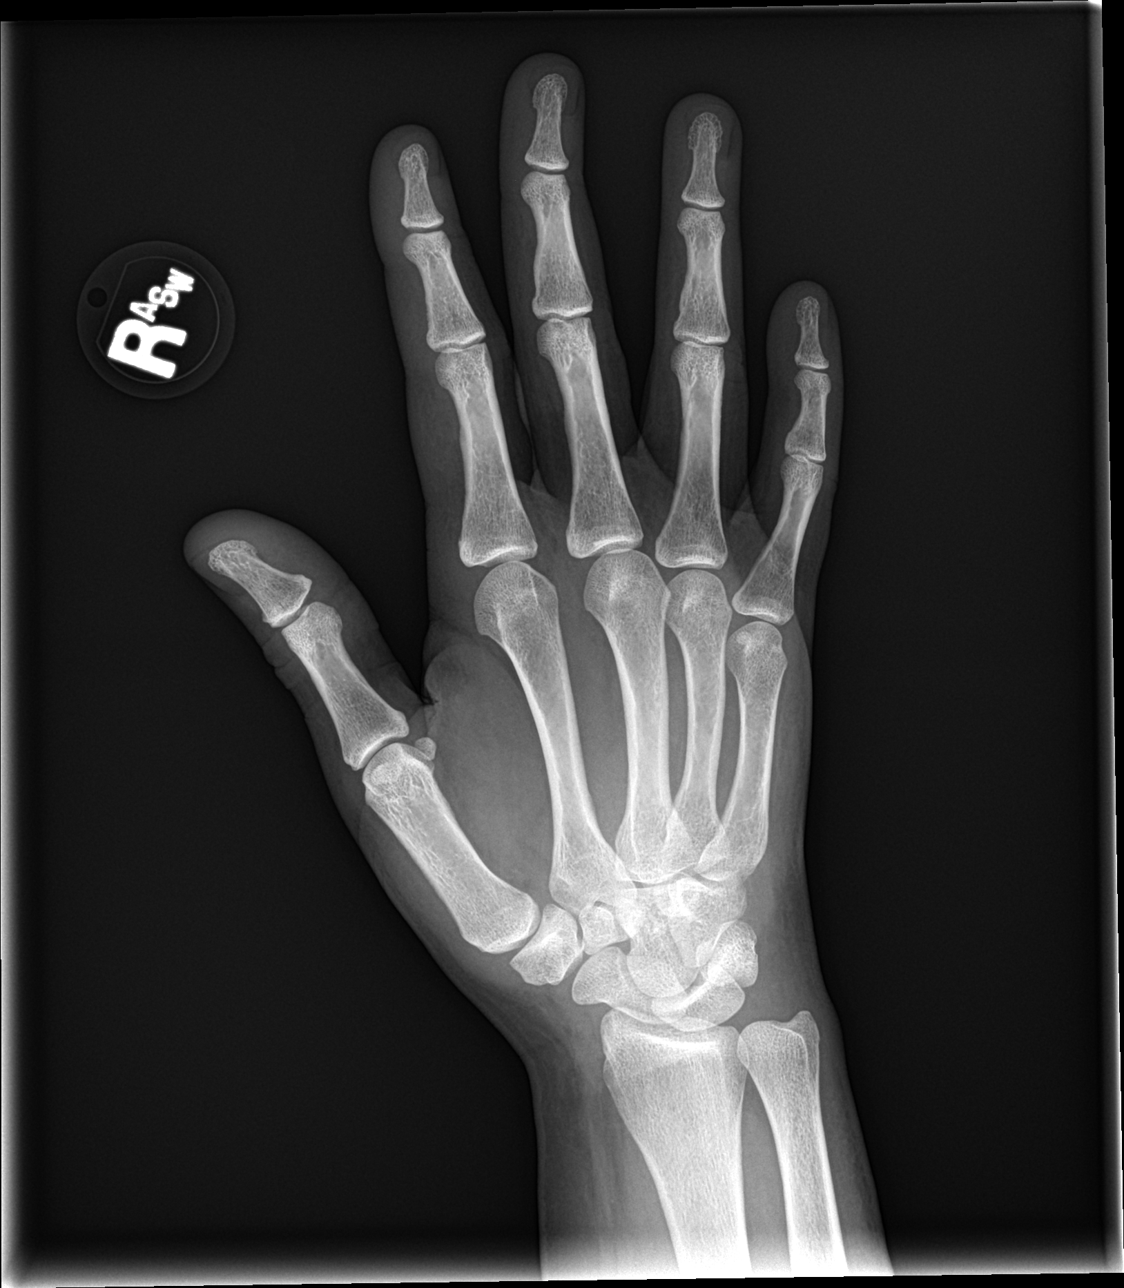

[hand lat]
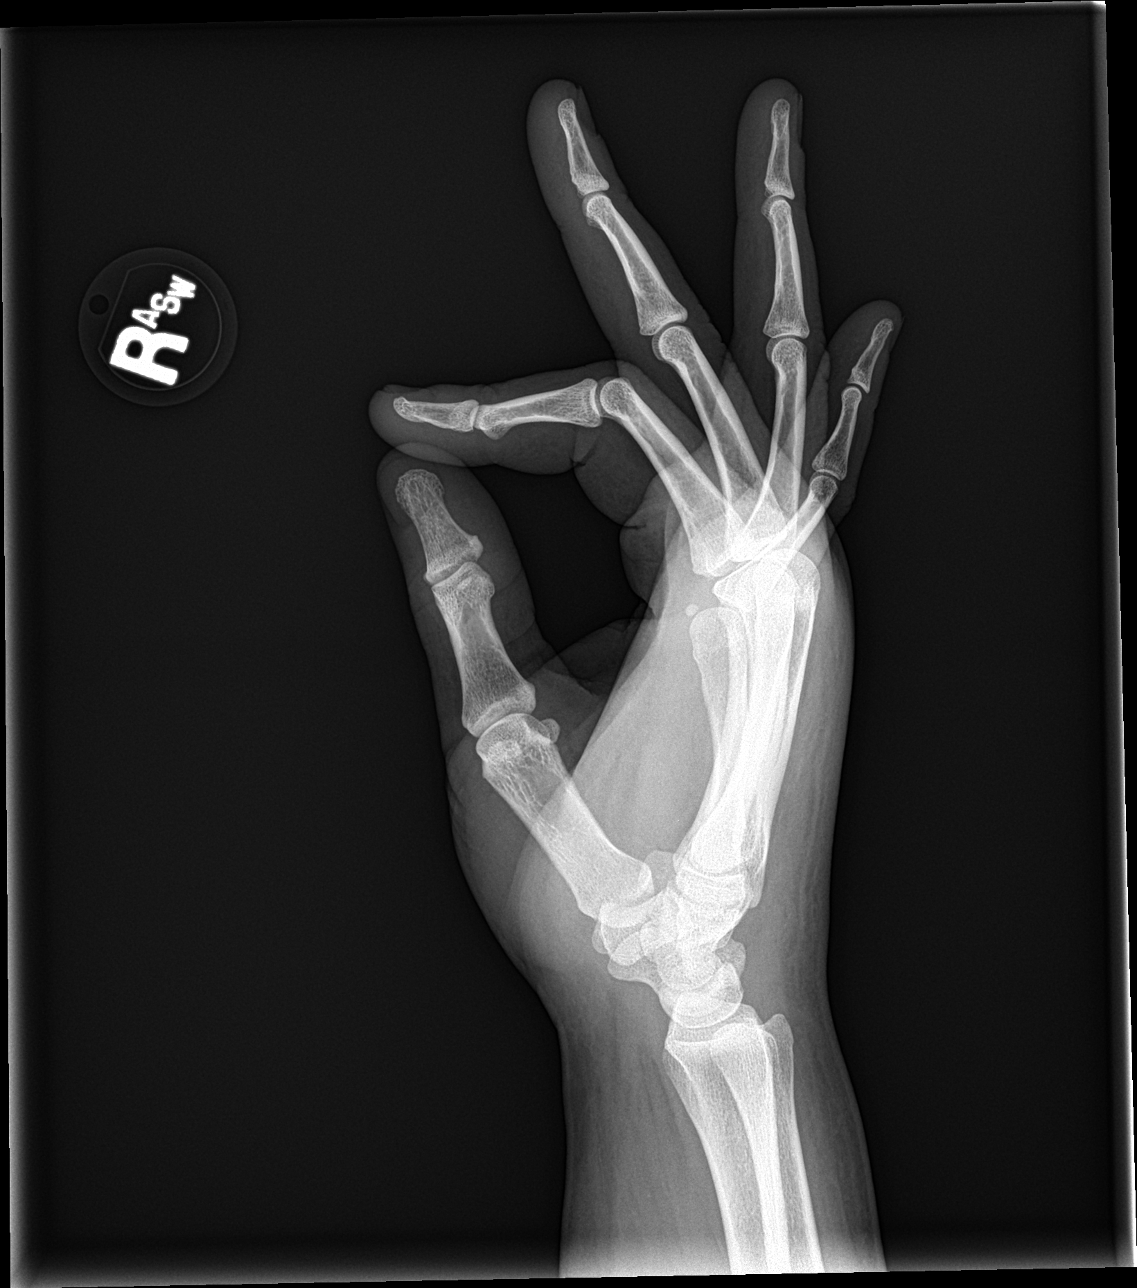

[3 of 3 positions shown; findings below may reference images not displayed]

FINDINGS: No fracture or bone lesion.

Joints are normally spaced and aligned.  No arthropathic changes.

Mild dorsal soft tissue swelling.  No soft tissue air.
IMPRESSION: 1. No fracture, bone lesion or joint abnormality.
2. Nonspecific soft tissue swelling.

## 2024-03-09 ENCOUNTER — Other Ambulatory Visit (FREE_STANDING_LABORATORY_FACILITY): Payer: Self-pay | Admitting: Nephrology

## 2024-03-09 LAB — POTASSIUM
Hemolysis Index: 11 {index}
Potassium: 4.8 meq/L (ref 3.5–5.3)

## 2024-03-09 LAB — HEMOGLOBIN AND HEMATOCRIT
Hematocrit: 33.8 % — ABNORMAL LOW (ref 34.7–43.7)
Hemoglobin: 10.1 g/dL — ABNORMAL LOW (ref 11.4–14.8)

## 2024-05-03 ENCOUNTER — Emergency Department
Admission: EM | Admit: 2024-05-03 | Discharge: 2024-05-03 | Disposition: A | Discharge status: Home / Self Care | Attending: Emergency Medicine | Admitting: Emergency Medicine

## 2024-05-03 DIAGNOSIS — X58XXXA Exposure to other specified factors, initial encounter: Secondary | ICD-10-CM | POA: Insufficient documentation

## 2024-05-03 DIAGNOSIS — T7840XA Allergy, unspecified, initial encounter: Secondary | ICD-10-CM | POA: Insufficient documentation

## 2024-05-03 DIAGNOSIS — N189 Chronic kidney disease, unspecified: Secondary | ICD-10-CM | POA: Insufficient documentation

## 2024-05-03 DIAGNOSIS — I509 Heart failure, unspecified: Secondary | ICD-10-CM | POA: Insufficient documentation

## 2024-05-03 DIAGNOSIS — I130 Hypertensive heart and chronic kidney disease with heart failure and stage 1 through stage 4 chronic kidney disease, or unspecified chronic kidney disease: Secondary | ICD-10-CM | POA: Insufficient documentation

## 2024-05-03 MED ORDER — HYDROXYZINE PAMOATE 25 MG PO CAPS
25.0000 mg | ORAL_CAPSULE | Freq: Three times a day (TID) | ORAL | 0 refills | Status: AC | PRN
Start: 2024-05-03 — End: ?

## 2024-05-03 MED ORDER — HYDROXYZINE PAMOATE 25 MG PO CAPS
25.0000 mg | ORAL_CAPSULE | Freq: Three times a day (TID) | ORAL | 0 refills | Status: DC | PRN
Start: 2024-05-03 — End: 2024-05-03

## 2024-05-03 MED ORDER — DIAZEPAM 5 MG PO TABS
5.0000 mg | ORAL_TABLET | Freq: Once | ORAL | Status: AC
Start: 2024-05-03 — End: 2024-05-03
  Administered 2024-05-03: 5 mg via ORAL
  Filled 2024-05-03: qty 1

## 2024-05-03 MED ORDER — PREDNISONE 20 MG PO TABS
20.0000 mg | ORAL_TABLET | Freq: Every day | ORAL | 0 refills | Status: DC
Start: 2024-05-03 — End: 2024-05-03

## 2024-05-03 MED ORDER — PREDNISONE 20 MG PO TABS
20.0000 mg | ORAL_TABLET | Freq: Every day | ORAL | 0 refills | Status: AC
Start: 2024-05-03 — End: 2024-05-08

## 2024-05-03 MED ORDER — HYDROXYZINE HCL 10 MG PO TABS
20.0000 mg | ORAL_TABLET | Freq: Once | ORAL | Status: AC
Start: 2024-05-03 — End: 2024-05-03
  Administered 2024-05-03: 20 mg via ORAL
  Filled 2024-05-03: qty 2

## 2024-05-03 NOTE — EDIE (Signed)
 [facility] NOTIFICATION 05/03/2024 13:38 [name], [name] DOB: April 22, 1981 MRN: [id number]    Criteria Met      5 ED Visits in 12 Months    Security and Safety  No Security Events were found.  ED Care Guidelines  There are currently no ED Care Guidelines for this patient. Please check your facility's medical records system.        Prescription Monitoring Program  Narx Score not available at this time.    E.D. Visit Count (12 mo.)  Facility Visits   [name] 4   [place]  [facility] [place] 2   [place] [facility] 1   [place] [facility]: [place] 1   [facility] 1   Total 9   Note: Visits indicate total known visits.     Recent Emergency Department Visit Summary  Date Facility [facility] Type Diagnoses or Chief Complaint    May 03, 2024  [facility]: Merrilyn Jackson.  [place]  Emergency      allergic reaction      Feb 04, 2024  [place]  [facility] [place].  [place]  Emergency      Chest pain, unspecified      Chest Pain      Medical Screening      Heart problem, SOB      Dec 07, 2023  [facility] [place].  [name].  [place]  Emergency      Unspecified injury of left foot, initial encounter      Pain in left lower leg      Blood Clot      Foot Injury      Blood Clot, dialysis pt; Fall      Blood Clot, dialysis pt      Nov 22, 2023  [name].  [place].  [place]  Emergency      Acute embolism and thrombosis of unspecified deep veins of unspecified proximal lower extremity      Leg Pain      leg pain      Oct 27, 2023  [facility].  [name].  [place]  Emergency      Orthostatic hypotension      Syncope and collapse      Loss of Consciousness      Fall, Head Injury      Oct 12, 2023  [facility].  [name].  [place]  Emergency      Candidal cystitis and urethritis      Urinary Tract Infection      UTi; on dialysis      Oct 02, 2023  [facility].  [name].  [place]  Emergency      Acute cystitis with hematuria      Urinary Tract Infection      Back  Pain Possible UTI      Jul 13, 2023    [facility] [place].  [place]  Emergency      Retention of urine, unspecified      Other chronic pain      Constipation, unspecified      Dyspnea, unspecified      Dorsalgia, unspecified      Fluid overload      Kidney issues/ fluid retention      Jun 20, 2023  [place] [facility] [place] [facility]  [place].  [place]  Emergency      Angioneurotic edema,  initial encounter      Allergy, unspecified, initial encounter      Allergic Reaction      Oral Swelling      Triage -- SOB,        Recent Inpatient Visit Summary  No Recent Inpatient Visits were found.  Care Team  Provider Specialty Phone Fax Service Dates   [name], [facility] [phone]  Current    [name], M.D., M.D. Internal Medicine [phone] Current      [facility]  This patient has registered at the [facility] Emergency Room: [place] [facility] Emergency Department  For more information visit: [email or web address]    [email or web address]     PLEASE NOTE:     1.   Any care recommendations and other clinical information are provided as guidelines or for historical purposes only, and providers should exercise their own clinical judgment when providing care.    2.   You may only use this information for purposes of treatment, payment or health care operations activities, and subject to the limitations of applicable [facility] Policies.    3.   You should consult directly with the organization that provided a care guideline or other clinical history with any questions about additional information or accuracy or completeness of information provided.    ? 2025 [facility] - [email or web address]

## 2024-05-03 NOTE — ED Provider Notes (Signed)
 History     Chief Complaint   Patient presents with    Allergic Reaction       Allergic Reaction       History of present illness:    43 year old female presents via EMS for chief complaint of allergic reaction patient reports that she is having severe diffuse itching of the arms and legs, also with mild rash, reports she had sensation of mild swelling of the tongue.  EMS report patient received Benadryl  and Solu-Medrol  prior to arrival, no swelling of tongue or face on arrival.  Patient reports that she still feels very itchy and anxious.  Denies other symptoms including shortness of breath, wheezing, chest pain, vomiting.  States was in usual state of health prior, does reports she has had issues with allergic reactions in the past with similar symptoms.  States that she is a dialysis patient and she completed dialysis yesterday.    Medical History[1]    Past Surgical History[2]    Family History[3]    Social  Social History[4]    .     Allergies[5]    Home Medications       Med List Status: In Progress Set By: Luiz Wanda Shermon Caretha, RN at 05/03/2024  7:44 PM              albuterol  sulfate HFA (PROVENTIL ) 108 (90 Base) MCG/ACT inhaler     Inhale 2 puffs into the lungs every 4 (four) hours as needed for Wheezing     apixaban  (ELIQUIS ) 5 MG (Expired)     Take 1 tablet (5 mg) by mouth every 12 (twelve) hours     Patient taking differently: Take 2.5 mg by mouth every 12 (twelve) hours     ARIPiprazole  (ABILIFY ) 5 MG tablet     Take 2 tablets (10 mg) by mouth daily     aspirin  81 MG chewable tablet     Chew 1 tablet (81 mg) by mouth daily     atorvastatin  (LIPITOR) 80 MG tablet (Expired)     Take 1 tablet (80 mg) by mouth nightly     budesonide -formoterol  (SYMBICORT ) 80-4.5 MCG/ACT inhaler (Expired)     Inhale 2 puffs into the lungs 2 (two) times daily     bumetanide  (BUMEX ) 2 MG tablet (Expired)     Take 1 tablet (2 mg) by mouth 2 (two) times daily     carvedilol  (COREG ) 25 MG tablet     Take 1  tablet (25 mg) by mouth 2 (two) times daily with meals     Dapagliflozin Propanediol (Farxiga) 10 MG Tab     Take 1 tablet (10 mg) by mouth daily     diclofenac  Sodium (VOLTAREN ) 1 % Gel topical gel     Apply 2 g topically 4 (four) times daily     DULoxetine  (CYMBALTA ) 20 MG capsule     Take 1 capsule (20 mg) by mouth daily     EPINEPHrine  0.15 MG/0.3ML injection     Inject 0.3 mLs (0.15 mg) into the muscle as needed     EPINEPHrine  0.3 MG/0.3ML auto-injector     Inject 0.3 mLs (0.3 mg) into the muscle as needed for Anaphylaxis (for Anaphylaxis)     folic acid  (FOLVITE ) 1 MG tablet     Take 1 tablet (1 mg) by mouth daily     hydrALAZINE  (APRESOLINE ) 100 MG tablet (Expired)     Take 1 tablet (100 mg) by mouth 2 (two) times daily  insulin  detemir (LEVEMIR ) 100 UNIT/ML injection     Inject 53 Units into the skin every 12 (twelve) hours     insulin  lispro 100 UNIT/ML injection     Inject 25 Units into the skin 3 (three) times daily before meals     Insulin  Syringe-Needle U-100 27G X 1/2 1 ML Misc     Inject 1 Dose into the skin 4 (four) times daily     lidocaine  (LIDODERM ) 5 %     Place 3 patches onto the skin every 24 hours Remove & Discard patch within 12 hours or as directed by MD     montelukast  (SINGULAIR ) 10 MG tablet     Take 1 tablet (10 mg) by mouth nightly     omeprazole (PriLOSEC) 40 MG capsule     Take 1 capsule (40 mg) by mouth daily     ondansetron  (ZOFRAN -ODT) 4 MG disintegrating tablet     Take 1 tablet (4 mg) by mouth every 6 (six) hours as needed for Nausea     pregabalin  (LYRICA ) 150 MG capsule     Take 1 capsule (150 mg) by mouth 2 (two) times daily     sertraline  (ZOLOFT ) 50 MG tablet     Take 1 tablet (50 mg) by mouth daily     traZODone  (DESYREL ) 50 MG tablet     Take 1 tablet (50 mg) by mouth nightly             Review of Systems   All other systems reviewed and are negative.      Physical Exam    BP: (!) 202/83, Heart Rate: 98, Temp: 97.7 F (36.5 C), Resp Rate: 20, SpO2: 98 %, Weight:  120.5 kg    Physical Exam  Vitals and nursing note reviewed.   Constitutional:       General: She is not in acute distress.     Appearance: She is not ill-appearing, toxic-appearing or diaphoretic.   HENT:      Head: Normocephalic.      Right Ear: External ear normal.      Left Ear: External ear normal.      Mouth/Throat:      Mouth: Mucous membranes are moist.      Comments: Oropharynx clear with patent airway, no tonsillar enlargement or exudate, uvula midline with symmetric posterior structures, no drooling and tolerating secretions, no tongue protrusion and floor of mouth without swelling, no facial swelling, no trismus, no stridor, no mucosal lesions.      Eyes:      Conjunctiva/sclera: Conjunctivae normal.       Cardiovascular:      Rate and Rhythm: Normal rate and regular rhythm.      Heart sounds: Normal heart sounds.   Pulmonary:      Effort: Pulmonary effort is normal. No respiratory distress.      Breath sounds: Normal breath sounds. No wheezing.   Abdominal:      General: There is no distension.     Musculoskeletal:         General: No signs of injury.     Skin:     General: Skin is warm and dry.      Coloration: Skin is not pale.      Comments: Excoriations of bilateral arms, no other rash or erythema appreciated     Neurological:      General: No focal deficit present.      Mental Status: She is alert and oriented to  person, place, and time.     Psychiatric:         Behavior: Behavior normal.           MDM and ED Course     ED Medication Orders (From admission, onward)      Start Ordered     Status Ordering Provider    05/03/24 2015 05/03/24 2002  diazePAM (VALIUM) tablet 5 mg  Once        Route: Oral  Ordered Dose: 5 mg       Last MAR action: Given Doaa Kendzierski L    05/03/24 2015 05/03/24 2002  hydrOXYzine  (ATARAX ) tablet 20 mg  Once        Route: Oral  Ordered Dose: 20 mg       Last MAR action: Given TERRYL NORLEEN CROME               Medical Decision Making  Risk  Prescription drug  management.        Differential diagnosis and management:     Patient presenting with allergic complaints, no angioedema patient nontoxic protecting airway clear lungs on exam.  Patient scratching profusely on arrival and noted to have excoriations of arms, patient given Atarax  and Valium with control of symptoms in ED.  Plan for short course of prednisone , as needed Atarax , follow-up outpatient with primary care and allergist with strict return precautions.  Patient reports that she has EpiPen  prescribed at home which is up-to-date, counseled on use indications.  Patient verbalized understand all questions answered prior to discharge    ______________________________________________________________________  Amount/complexity of Data Reviewed   L\  Look below for information    History obtained from another historian -see HPI or here (parent, spouse,  care giver, ems) : EMS                                   ED Course as of 05/03/24 2057   Tue May 03, 2024   2052 Patient reassessed, reports symptoms controlled, no rash or angioedema.  Patient states she feels well to go home at this time discussed findings, plan, and follow-up with patient.  Discussed return precautions. Patient verbalized understanding and all questions answered to patient's satisfaction.     [JS]      ED Course User Index  [JS] Jatara Huettner, NORLEEN CROME, MD             Procedures    Clinical Impression & Disposition     Clinical Impression  Final diagnoses:   Allergic reaction, initial encounter        ED Disposition       ED Disposition   Discharge    Condition   --    Date/Time   Tue May 03, 2024  8:51 PM    Comment   Renny Sherrye Brandy discharge to home/self care.    Condition at disposition: Stable                  New Prescriptions    HYDROXYZINE  (VISTARIL ) 25 MG CAPSULE    Take 1 capsule (25 mg) by mouth 3 (three) times daily as needed for Itching    PREDNISONE  (DELTASONE ) 20 MG TABLET    Take 1 tablet (20 mg) by mouth once daily for 5 days                    [1]  Past Medical History:  Diagnosis Date    Anemia     Arthritis     Asthma     Chronic kidney disease     Stage III    Congestive heart failure (CMS/HCC)     Deep venous thrombosis of distal lower extremity (CMS/HCC)     Diabetes mellitus (CMS/HCC)     Fibromyalgia     Gastroesophageal reflux disease     Hyperlipidemia     Hypertension     Neuropathy     Pulmonary embolism (CMS/HCC) 08/2019    Renal insufficiency     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 kr    CESAREAN SECTION      x 2    CHOLECYSTECTOMY      EYE SURGERY Bilateral     cataract    HYSTERECTOMY  12/2019    RELEASE, CARPAL TUNNEL Left 07/10/2022    Procedure: RELEASE, CARPAL TUNNEL;  Surgeon: Elson Norleen SAUNDERS, MD;  Location: [facility];  Service: Plastics;  Laterality: Left;    REPAIR, UPPER EXTREMITY, TENDON & NERVE Right 12/05/2021    Procedure: REPAIR, RIGHT ULNAR NERVE AND NEUROPLASTY @ ELBOW AND WRIST, RIGHT OPEN CARPAL TUNNEL RELEASE;  Surgeon: Elson Norleen SAUNDERS, MD;  Location: [facility];  Service: Plastics;  Laterality: Right;    REPAIR, UPPER EXTREMITY, TENDON & NERVE Left 07/10/2022    Procedure: LEFT ULNAR NERVE NEUROPLASTY AT ELBOW AND WRIST, LEFT OPEN CARPAL TUNNEL RELEASE;  Surgeon: Elson Norleen SAUNDERS, MD;  Location: [facility];  Service: Plastics;  Laterality: Left;    UTERINE RUPTURE     [3]   Family History  Problem Relation Name Age of Onset    Heart disease Mother      Hypertension Mother      Diabetes Mother      Asthma Father       Hypertension Father      Diabetes Daughter      Asthma Son      Cancer Maternal Aunt      Cancer Maternal Grandmother     [4]   Social History  Tobacco Use    Smoking status: Never    Smokeless tobacco: Never   Vaping Use    Vaping status: Never Used   Substance Use Topics    Alcohol  use: Never    Drug use: Never   [5]   Allergies  Allergen Reactions    Amlodipine Hives    Bactrim [Sulfamethoxazole-Trimethoprim] Hives, Respiratory Distress and Edema    Glipizide Edema     Throat swelling    Lisinopril Edema     Tongue swelling    Losartan Other (See Comments), Swelling and Edema        Ceola Para, Norleen CROME, MD  05/03/24 2057

## 2024-08-18 ENCOUNTER — Ambulatory Visit (INDEPENDENT_AMBULATORY_CARE_PROVIDER_SITE_OTHER)

## 2024-08-18 ENCOUNTER — Encounter (INDEPENDENT_AMBULATORY_CARE_PROVIDER_SITE_OTHER): Payer: Self-pay

## 2024-08-18 VITALS — BP 104/66 | HR 83 | Temp 98.6°F | Resp 22 | Wt 265.0 lb

## 2024-08-18 DIAGNOSIS — L089 Local infection of the skin and subcutaneous tissue, unspecified: Secondary | ICD-10-CM

## 2024-08-18 MED ORDER — DOXYCYCLINE HYCLATE 100 MG PO TABS
100.0000 mg | ORAL_TABLET | Freq: Two times a day (BID) | ORAL | 0 refills | Status: AC
Start: 2024-08-18 — End: 2024-08-25

## 2024-08-18 MED ORDER — MUPIROCIN 2 % EX OINT
TOPICAL_OINTMENT | Freq: Three times a day (TID) | CUTANEOUS | 0 refills | Status: AC
Start: 2024-08-18 — End: ?

## 2024-08-18 NOTE — Progress Notes (Signed)
 [facility]  OFFICE NOTE         Subjective   Historian: Patient      Chief Complaint   Patient presents with    Mass     Pt c/o a pimp in her scalp that might be infected cyst grew is tender and is now affecting her left side of her scalp and head can't sleep         History of Present Illness  [name] is a 43 year old female who presents with a painful  pimple on her scalp.    The boil developed on the left side of her scalp after a recent hair styling session with a new stylist. It causes significant discomfort and disrupts her sleep. She attributes it  to tight braids. She has been applying Neosporin, hot compresses, hot water  in the shower, and salt to the area, which provide some relief. Patient denies fever of chills.     History:  Medications and Allergies reviewed.   Pertinent Past Medical, Surgical, Family and Social History were reviewed.        Objective     Vitals:    08/18/24 1651   BP: 104/66   Pulse: 83   Resp: 22   Temp: 98.6 F (37 C)   TempSrc: Tympanic   SpO2: 95%   Weight: 120.2 kg (265 lb)     Physical Exam  Constitutional:       Appearance: Normal appearance.   Cardiovascular:      Rate and Rhythm: Normal rate.      Pulses: Normal pulses.   Neurological:      General: No focal deficit present.      Mental Status: She is alert.   Skin:     Findings: Lesion present.           Comments: Pea sized  mass  on scalp with erythema and swelling. No drainage noted.       Physical Exam  HEENT: mass  on scalp with erythema,   no fluctuance or  drainage.   Urgent Care Course   There were no labs reviewed with this patient during the visit.    There were no x-rays reviewed with this patient during the visit.      Procedures   Procedures     Assessment / Plan       Assessment & Plan  Skin infection  Left scalp abscess   Not ready for drainage.   - Prescribed doxycycline  100 mg BID for 7 days.  - Instructed to apply hot compresses BID.  - Prescribed Bactroban ointment  post-compress application.  - Sent wound culture for bacterial identification  Differential Diagnoses including but not limited to: cellulitis, cyst , necrotizing fascitis.        [name] was seen today for mass.    Diagnoses and all orders for this visit:    Local infection of the skin and subcutaneous tissue, unspecified  -     doxycycline  (VIBRA -TABS) 100 MG tablet; Take 1 tablet (100 mg) by mouth 2 (two) times daily for 7 days  -     mupirocin (BACTROBAN) 2 % ointment; Apply topically 3 (three) times daily  -     Culture + Gram Stain,Aerobic, Wound; Future  -     Culture + Gram Stain,Aerobic, Wound         The indications for early follow-up with PCP and return to UC were discussed. Patient/family received education  on the working diagnosis, diagnostic uncertainties, and proposed treatment plan. Indications for emergency evaluation in the ED were reviewed. Written and verbal discharge instructions were provided and discussed and all questions from the patient/family were addressed, with no apparent barriers.      Verbal consent obtained to record this visit.

## 2024-08-24 ENCOUNTER — Ambulatory Visit (INDEPENDENT_AMBULATORY_CARE_PROVIDER_SITE_OTHER): Payer: Self-pay | Admitting: Medical

## 2024-08-24 LAB — CULTURE + GRAM STAIN,AEROBIC, WOUND
